# Patient Record
Sex: Female | Born: 1945 | Race: White | Hispanic: No | Marital: Married | Smoking: Never smoker
Health system: Southern US, Community
[De-identification: ages and names within clinical notes are randomized; demographics above are authoritative.]

## PROBLEM LIST (undated history)

## (undated) DIAGNOSIS — M8580 Other specified disorders of bone density and structure, unspecified site: Secondary | ICD-10-CM

## (undated) DIAGNOSIS — K922 Gastrointestinal hemorrhage, unspecified: Secondary | ICD-10-CM

## (undated) DIAGNOSIS — I429 Cardiomyopathy, unspecified: Secondary | ICD-10-CM

## (undated) DIAGNOSIS — R011 Cardiac murmur, unspecified: Secondary | ICD-10-CM

## (undated) DIAGNOSIS — Z8719 Personal history of other diseases of the digestive system: Secondary | ICD-10-CM

## (undated) DIAGNOSIS — R42 Dizziness and giddiness: Secondary | ICD-10-CM

## (undated) DIAGNOSIS — I509 Heart failure, unspecified: Secondary | ICD-10-CM

## (undated) DIAGNOSIS — M1990 Unspecified osteoarthritis, unspecified site: Secondary | ICD-10-CM

## (undated) DIAGNOSIS — C801 Malignant (primary) neoplasm, unspecified: Secondary | ICD-10-CM

## (undated) DIAGNOSIS — K529 Noninfective gastroenteritis and colitis, unspecified: Secondary | ICD-10-CM

## (undated) DIAGNOSIS — I499 Cardiac arrhythmia, unspecified: Secondary | ICD-10-CM

## (undated) DIAGNOSIS — G4700 Insomnia, unspecified: Secondary | ICD-10-CM

## (undated) DIAGNOSIS — E785 Hyperlipidemia, unspecified: Secondary | ICD-10-CM

## (undated) DIAGNOSIS — N393 Stress incontinence (female) (male): Secondary | ICD-10-CM

## (undated) DIAGNOSIS — D649 Anemia, unspecified: Secondary | ICD-10-CM

## (undated) DIAGNOSIS — K5790 Diverticulosis of intestine, part unspecified, without perforation or abscess without bleeding: Secondary | ICD-10-CM

## (undated) DIAGNOSIS — N189 Chronic kidney disease, unspecified: Secondary | ICD-10-CM

## (undated) DIAGNOSIS — I4891 Unspecified atrial fibrillation: Secondary | ICD-10-CM

## (undated) DIAGNOSIS — K219 Gastro-esophageal reflux disease without esophagitis: Secondary | ICD-10-CM

## (undated) DIAGNOSIS — E7800 Pure hypercholesterolemia, unspecified: Secondary | ICD-10-CM

## (undated) DIAGNOSIS — R0600 Dyspnea, unspecified: Secondary | ICD-10-CM

## (undated) DIAGNOSIS — I10 Essential (primary) hypertension: Secondary | ICD-10-CM

## (undated) DIAGNOSIS — R7303 Prediabetes: Secondary | ICD-10-CM

## (undated) DIAGNOSIS — I351 Nonrheumatic aortic (valve) insufficiency: Secondary | ICD-10-CM

## (undated) HISTORY — DX: Chronic kidney disease, unspecified: N18.9

## (undated) HISTORY — PX: TOTAL KNEE ARTHROPLASTY: SHX125

## (undated) HISTORY — DX: Gastrointestinal hemorrhage, unspecified: K92.2

## (undated) HISTORY — DX: Heart failure, unspecified: I50.9

## (undated) HISTORY — DX: Pure hypercholesterolemia, unspecified: E78.00

## (undated) HISTORY — DX: Stress incontinence (female) (male): N39.3

## (undated) HISTORY — PX: SACROCOCCYGEAL ULCER REMOVAL: SHX2369

## (undated) HISTORY — DX: Other specified disorders of bone density and structure, unspecified site: M85.80

## (undated) HISTORY — DX: Nonrheumatic aortic (valve) insufficiency: I35.1

## (undated) HISTORY — DX: Insomnia, unspecified: G47.00

## (undated) HISTORY — PX: COLONOSCOPY: SHX174

## (undated) HISTORY — PX: TONSILLECTOMY: SUR1361

---

## 1995-05-30 HISTORY — PX: ANKLE SURGERY: SHX546

## 2004-11-07 ENCOUNTER — Ambulatory Visit: Payer: Self-pay | Admitting: General Practice

## 2005-11-07 ENCOUNTER — Ambulatory Visit: Payer: Self-pay | Admitting: General Practice

## 2006-11-13 ENCOUNTER — Ambulatory Visit: Payer: Self-pay | Admitting: Endocrinology

## 2008-05-17 ENCOUNTER — Emergency Department: Payer: Self-pay | Admitting: Emergency Medicine

## 2010-08-04 ENCOUNTER — Ambulatory Visit: Payer: Self-pay | Admitting: Urology

## 2010-08-08 ENCOUNTER — Ambulatory Visit: Payer: Self-pay | Admitting: Urology

## 2011-05-25 ENCOUNTER — Inpatient Hospital Stay: Payer: Self-pay | Admitting: Internal Medicine

## 2011-05-27 LAB — HM COLONOSCOPY

## 2011-05-30 LAB — CA 125: CA 125: 11.9 U/mL (ref 0.0–34.0)

## 2011-05-30 LAB — CEA: CEA: 0.6 ng/mL (ref 0.0–4.7)

## 2011-07-11 ENCOUNTER — Ambulatory Visit: Payer: Self-pay | Admitting: Unknown Physician Specialty

## 2012-03-21 ENCOUNTER — Ambulatory Visit: Payer: Self-pay | Admitting: Family Medicine

## 2012-04-22 ENCOUNTER — Ambulatory Visit: Payer: Self-pay | Admitting: Oncology

## 2012-04-22 LAB — CBC CANCER CENTER
Basophil #: 0.1 x10 3/mm (ref 0.0–0.1)
Eosinophil %: 2.7 %
HCT: 26.7 % — ABNORMAL LOW (ref 35.0–47.0)
HGB: 7.8 g/dL — ABNORMAL LOW (ref 12.0–16.0)
MCH: 20.2 pg — ABNORMAL LOW (ref 26.0–34.0)
MCHC: 29.1 g/dL — ABNORMAL LOW (ref 32.0–36.0)
MCV: 69 fL — ABNORMAL LOW (ref 80–100)
Monocyte #: 0.3 x10 3/mm (ref 0.2–0.9)
Monocyte %: 5 %
Neutrophil %: 71.3 %
Platelet: 254 x10 3/mm (ref 150–440)
RBC: 3.85 10*6/uL (ref 3.80–5.20)
RDW: 17.4 % — ABNORMAL HIGH (ref 11.5–14.5)

## 2012-04-22 LAB — LACTATE DEHYDROGENASE: LDH: 158 U/L (ref 81–246)

## 2012-04-22 LAB — FOLATE: Folic Acid: 46 ng/mL (ref 3.1–100.0)

## 2012-04-22 LAB — RETICULOCYTES: Reticulocyte: 4.91 % — ABNORMAL HIGH (ref 0.5–2.2)

## 2012-04-28 ENCOUNTER — Ambulatory Visit: Payer: Self-pay | Admitting: Oncology

## 2012-05-28 ENCOUNTER — Ambulatory Visit: Payer: Self-pay | Admitting: Unknown Physician Specialty

## 2012-05-29 ENCOUNTER — Ambulatory Visit: Payer: Self-pay | Admitting: Oncology

## 2012-06-06 ENCOUNTER — Ambulatory Visit: Payer: Self-pay | Admitting: Unknown Physician Specialty

## 2012-06-07 LAB — PATHOLOGY REPORT

## 2012-06-20 ENCOUNTER — Ambulatory Visit: Payer: Self-pay | Admitting: Surgery

## 2012-06-24 ENCOUNTER — Ambulatory Visit: Payer: Self-pay | Admitting: Unknown Physician Specialty

## 2013-01-15 ENCOUNTER — Ambulatory Visit: Payer: Self-pay | Admitting: Unknown Physician Specialty

## 2014-06-25 DIAGNOSIS — I4892 Unspecified atrial flutter: Secondary | ICD-10-CM | POA: Diagnosis not present

## 2014-06-25 DIAGNOSIS — I4891 Unspecified atrial fibrillation: Secondary | ICD-10-CM | POA: Diagnosis not present

## 2014-07-21 DIAGNOSIS — I4891 Unspecified atrial fibrillation: Secondary | ICD-10-CM | POA: Diagnosis not present

## 2014-07-21 DIAGNOSIS — I4892 Unspecified atrial flutter: Secondary | ICD-10-CM | POA: Diagnosis not present

## 2014-08-06 DIAGNOSIS — R070 Pain in throat: Secondary | ICD-10-CM | POA: Diagnosis not present

## 2014-08-06 DIAGNOSIS — I4891 Unspecified atrial fibrillation: Secondary | ICD-10-CM | POA: Diagnosis not present

## 2014-08-06 DIAGNOSIS — J019 Acute sinusitis, unspecified: Secondary | ICD-10-CM | POA: Diagnosis not present

## 2014-08-25 DIAGNOSIS — I38 Endocarditis, valve unspecified: Secondary | ICD-10-CM | POA: Diagnosis not present

## 2014-08-25 DIAGNOSIS — I4892 Unspecified atrial flutter: Secondary | ICD-10-CM | POA: Diagnosis not present

## 2014-08-25 DIAGNOSIS — I4891 Unspecified atrial fibrillation: Secondary | ICD-10-CM | POA: Diagnosis not present

## 2014-08-25 DIAGNOSIS — I10 Essential (primary) hypertension: Secondary | ICD-10-CM | POA: Diagnosis not present

## 2014-08-25 DIAGNOSIS — E782 Mixed hyperlipidemia: Secondary | ICD-10-CM | POA: Diagnosis not present

## 2014-09-01 DIAGNOSIS — R600 Localized edema: Secondary | ICD-10-CM | POA: Diagnosis not present

## 2014-09-01 DIAGNOSIS — E782 Mixed hyperlipidemia: Secondary | ICD-10-CM | POA: Diagnosis not present

## 2014-09-01 DIAGNOSIS — I482 Chronic atrial fibrillation: Secondary | ICD-10-CM | POA: Diagnosis not present

## 2014-09-01 DIAGNOSIS — I10 Essential (primary) hypertension: Secondary | ICD-10-CM | POA: Diagnosis not present

## 2014-09-09 DIAGNOSIS — N183 Chronic kidney disease, stage 3 (moderate): Secondary | ICD-10-CM | POA: Diagnosis not present

## 2014-09-09 DIAGNOSIS — I10 Essential (primary) hypertension: Secondary | ICD-10-CM | POA: Diagnosis not present

## 2014-09-09 DIAGNOSIS — E119 Type 2 diabetes mellitus without complications: Secondary | ICD-10-CM | POA: Diagnosis not present

## 2014-09-09 DIAGNOSIS — E780 Pure hypercholesterolemia: Secondary | ICD-10-CM | POA: Diagnosis not present

## 2014-09-09 DIAGNOSIS — I509 Heart failure, unspecified: Secondary | ICD-10-CM | POA: Diagnosis not present

## 2014-09-09 DIAGNOSIS — Z0000 Encounter for general adult medical examination without abnormal findings: Secondary | ICD-10-CM | POA: Diagnosis not present

## 2014-09-20 NOTE — Discharge Summary (Signed)
PATIENT NAME:  Natalie Gardner, Natalie Gardner MR#:  [id number] DATE OF BIRTH:  1946/02/27  DATE OF ADMISSION:  05/25/2011 DATE OF DISCHARGE:  05/29/2011  PRIMARY CARE PHYSICIAN: [name]    REFERRING PHYSICIAN: Dr. Lovena Le   ADMITTING PHYSICIAN: Dr. Dustin Flock   DISCHARGING PHYSICIAN: Dr. Deanne Coffer   ADMITTING DIAGNOSES:  1. Weakness. 2. Anemia. 3. Nausea. 4. Vomiting. 5. Diarrhea.   DISCHARGE DIAGNOSES:  1. Iron deficiency anemia. 2. Microcytic anemia secondary to ulcer. 3. Duodenal stricture. 4. Gastritis. 5. Duodenitis. 6. Diarrhea.  7. Hypertension.  8. Hyperlipidemia.  9. Diabetes.   CONSULTANTS:  1. Dr. Loistine Simas  2. Dr. Leanora Cover  3. Dr. Vira Agar     TESTS DONE DURING THIS HOSPITALIZATION:  1. Chest x-ray, PA and lateral, 12/27, showed mildly increased interstitial markings in both lungs. This may reflect history of tobacco abuse. These findings seem to indicate chronic obstructive pulmonary disease. 2. CT scan of the abdomen and pelvis with contrast on 12/27 showed no CT evidence of obstructive or inflammatory abnormalities. Prominent mesenteric and retroperitoneal lymph nodes as well as prominent spleen.  3. CT of the chest with contrast 12/29 showed bilateral small pleural effusions. If there is clinical concern regarding extrinsic compression of the duodenum this would be better assessed on a CT of the abdomen which has already been performed on 05/25/2011. 4. Barium swallow upper GI series 12/31 showed a fixed narrowing of the second portion of the duodenum measuring less than 2 cm in length which persists throughout the examination most concerning for a mild stricture. Correlate with endoscopic evaluation which has already been performed.  5. Upper endoscopy done on 05/27/2011 by Dr. Gaylyn Cheers showed mild Schatzki's ring, gastric ulcer with clean base, duodenitis, duodenal stricture. 6. Colonoscopy 12/29 performed by Dr. Vira Agar which showed internal hemorrhoids  which were small. Biopsies were taken of the colon in ascending, transverse, and sigmoid colon. Impression internal hemorrhoids.   HOSPITAL COURSE: Initial history and physical were done by Dr. Dustin Flock. Please refer to his note dated 05/25/2011 for complete details. In brief, this is a 69 year old white female with past medical history of gastroesophageal reflux disease, hyperlipidemia, hypertension, history of heart murmur, and diet controlled diabetes who presented with weakness, anemia, nausea, vomiting, and diarrhea. Her hemoglobin was found to be 7.9. She was admitted to the hospitalist service.  1. For microcytic anemia secondary to iron deficiency anemia, the patient received blood transfusion 2 units packed red blood cells. Her hemoglobin improved appropriately. She had iron studies which showed microcytic anemia. Stool occult was negative. She had the above-noted upper endoscopy as well as colonoscopy. The patient had colonoscopy and had small ulcer and inflammation as well as duodenal stricture. She was put on PPI.  2. Diarrhea. Stool studies were negative. The patient was on Cipro. Diarrhea resolved.  3. Hypertension. This was borderline. We initially held her ACE inhibitor, hydrochlorothiazide, and Norvasc. This was resumed upon discharge. 4. Hyperlipidemia. The patient was continued on statin.  5. Diabetes. The patient was on sliding scale insulin.   6. Evidence of multiple retroperitoneal mesentery nodes with duodenal stricture. Oncology and Surgery were consulted, Dr. Leanora Cover and Dr. Loistine Simas. They reviewed the films as well as repeated CT of the chest and repeat CT showed no lymphadenopathy. Tumor markers with CEA, CA-125, 19-9 were sent and ordered by Dr. Loistine Simas. Hepatitis panels are still pending. CEA was within normal limits. CEA 125 within normal limits. CA-19-9 within normal limits. Angiotensin-converting enzyme still pending.  7. In  light of no lymphadenopathy, the patient  underwent upper GI series with barium swallow which was performed on 05/29/2011 which showed narrowing. The patient will be discharged on mechanical soft diet, follow-up with Dr. Vira Agar in 6 to 8 weeks' time. She should have repeat endoscopy to see if inflammation is improved and this was the cause of her strictures. There is no extrinsic cause, i.e., mass or lymphadenopathy causing this.  8. DVT prophylaxis. The patient was ambulatory.   The patient was discharged on 05/29/2011. Temperature 98.2, heart rate 82, respiratory rate 18, blood pressure 135/86, sating 94% on room air. Lungs clear to auscultation. Cardiovascular regular rate and rhythm. Abdomen benign.   DISCHARGE MEDICATIONS:  1. Hydrochlorothiazide 25 mg once a day. 2. Lisinopril 40 mg daily.  3. MVI 1 tablet daily.  4. Simvastatin 20 mg daily.  5. Zolpidem 5 mg at bedtime as needed for sleep.  6. Protonix 40 mg twice daily.  7. Ferrous sulfate 325 mg daily.  8. Colace 100 mg twice daily.   DO NOT TAKE: Hold aspirin and avoid nonsteroidals.   DIET: Low sodium, mechanical soft low residue, low fat diet.   ACTIVITY: As tolerated.   FOLLOW-UP:  1. She is to see [name] in 5 to 6 weeks. 2. Dr. Vira Agar in two weeks. 3. In 2 to 3 weeks by her primary care physician she should have checked a CBC and reticulocyte count.   CODE STATUS: FULL CODE.   TOTAL TIME SPENT ON DISCHARGE: 50 minutes.   Thank you for allowing me to participate in the care of this patient.    ____________________________ Judeth Horn. Royden Purl, MD aaf:drc D: 05/29/2011 14:53:40 ET T: 06/01/2011 10:49:16 ET JOB#: [id number]  cc: Mike Craze A. Royden Purl, MD, <Dictator> Consuela Mimes, MD Guadalupe Maple, MD Manya Silvas, MD Rolm Gala, MD Joaquin Bend MD ELECTRONICALLY SIGNED 06/01/2011 13:07

## 2014-09-20 NOTE — H&P (Signed)
PATIENT NAME:  Natalie Gardner, Natalie Gardner MR#:  [id number] DATE OF BIRTH:  1945/10/01  DATE OF ADMISSION:  05/25/2011  PRIMARY CARE PHYSICIAN: [name]  ED REFERRING PHYSICIAN: Dr. Lovena Le  CHIEF COMPLAINT: Weakness, anemia, nausea, vomiting, diarrhea.   HISTORY OF PRESENT ILLNESS: Patient is a 69 year old white female with history of gastroesophageal reflux disease, hyperlipidemia, hypertension, history of heart murmur, diet controlled diabetes who was in her usual state of health until about 2 to 3 weeks ago when she developed GI symptoms with diarrhea which was very liquidy, initially she was having 5 to 6 bowel movements associated with nausea and vomiting. She was seen by [name] last week. At that time she had a CBC done which showed elevated WBC therefore she was started on Cipro p.o. With the Cipro treatment her diarrhea frequency decreased. Recheck of her CBC showed WBC was now normal. However, she continues to have emesis and is also having diarrhea but the diarrhea frequency is now 2 to 3 times a day. She denies any blood in her stool. [name] checked hemoglobin last week which was 9.6. Recheck today showed a hemoglobin of 7.9. He had also done an iron panel which showed her ferritin to be 5, her iron level was 10 and iron saturation was 3%. Due to her hemoglobin dropping he referred her to ED for progressive weakness. Patient in the ED was seen by the ED physician and had rectal examination which was guaiac-negative. She underwent a CT scan of the abdomen which showed prominent mesenteric and retroperitoneal lymph node as well as prominent spleen. Patient otherwise denies any fevers or chills. She reports that the only antibiotic she has recently been on is the Cipro which was started after the diarrhea. The Cipro has only been treated for five days. She otherwise denies any fevers or chills. She does report some 10 pound weight loss over the past few weeks to months. She otherwise denies any  chest pain. No shortness of breath. No cough. No hemoptysis. No hematemesis. No blood in her stools that she has noticed. No hematuria. She denies any easy bruisability. Patient had a colonoscopy done 4 to 5 years ago which has been negative.   PAST MEDICAL HISTORY:  1. Gastroesophageal reflux disease.  2. Hyperlipidemia.  3. Hypertension.  4. History of heart murmur.  5. Diet controlled diabetes.  6. Status post tonsillectomy, adenoidectomy.  7. Status post right ankle fracture repair.   ALLERGIES: Sulfa, penicillin.   CURRENT MEDICATIONS:  1. Multivitamin daily.  2. Cipro 500, 1 tab p.o. q.12.  3. Aspirin 325, which has been on hold since last week. 4. Ambien 10 mg at bedtime.  5. Amlodipine 5 daily.  6. Lisinopril 40 daily.  7. Simvastatin 20 daily.  8. HCTZ 25 daily.   SOCIAL HISTORY: Nonsmoker. No alcohol or drug use.   FAMILY HISTORY: Positive for grandmother with gallbladder issues. Brother had colon cancer.   REVIEW OF SYSTEMS: CONSTITUTIONAL: No fevers. Complains of fatigue, weakness, mild abdominal pain. Complains of weight loss, 10 pounds. EYES: No blurred or double vision. No pain. No redness. No inflammation. No glaucoma. No cataracts. ENT: No tinnitus. No ear pain. No hearing loss. No seasonal allergies. No epistaxis. No nasal discharge. No snoring. No difficulty swallowing. RESPIRATORY: No cough. No wheezing. No hemoptysis. No dyspnea. No asthma. No painful respirations. No chronic obstructive pulmonary disease. No tuberculosis. CARDIOVASCULAR: No chest pain. No orthopnea. No edema. No arrhythmia. No dyspnea on exertion. GASTROINTESTINAL: Has nausea, vomiting, diarrhea,  vague epigastric discomfort. No hematemesis. No melena. No ulcers. Does have a history of gastroesophageal reflux disease. No irritable bowel syndrome. No jaundice. No rectal bleeding. No hemorrhoids. GENITOURINARY: Denies any dysuria, hematuria, renal calculus, frequency or incontinence. ENDOCRINE: Denies  any polyuria, nocturia, or thyroid problems. No increased sweating, heat or cold intolerance. HEME/LYMPH: Denies easy bruisability or bleeding. Recent diagnosis of anemia, none prior to that, but patient never had blood work that she recalls prior to that. SKIN: No acne. No rash. No changes in lesions. MUSCULOSKELETAL: Denies any pain in neck, back, or shoulder. NEUROLOGIC: No numbness. No weakness. No dysarthria. No cerebrovascular accident. No transient ischemic attack. PSYCHIATRIC: No anxiety. No insomnia. No ADD. No OCD.   PHYSICAL EXAMINATION:  VITAL SIGNS: Temperature 98.2, pulse 70, respiratory rate 20, blood pressure 117/62, O2 96% on room air.   GENERAL: Patient is a 69 year old white female well developed, well nourished female in no acute distress.   HEENT: Pupils equal, round, reactive to light, accommodation. Extraocular movements intact. There is no scleral icterus. Conjunctival pallor. Oropharynx without any lesions or ulcerations. No exudates. Nasal exam shows no drainage or ulcerations. Ear exam shows no erythema or drainage.   NECK: No thyromegaly. No carotid bruits.   CARDIOVASCULAR: Regular rate and rhythm. No murmurs, rubs, clicks, or gallops. PMI is not displaced.   LUNGS: Clear to auscultation bilaterally without any rales, rhonchi, wheezing.   ABDOMEN: Soft, nontender, nondistended. Positive bowel sounds x4. No hepatosplenomegaly per examination.   EXTREMITIES: No clubbing, cyanosis, or edema.   NEUROLOGIC: Awake, alert, oriented x3.   GENITOURINARY: Performed by the ED physician guaiac-negative.   SKIN: There is no rash.   LYMPHATICS: No lymph nodes palpable in the neck.   VASCULAR: Good DP, PT pulses.   NEUROLOGICAL: Awake, alert, oriented x3. No focal deficits.   PSYCHIATRIC: Not anxious or depressed.   LABORATORY, DIAGNOSTIC AND RADIOLOGICAL DATA: CT scan of the abdomen shows no evidence of obstructive or inflammatory abnormalities. Prominent mesenteric  and retroperitoneal lymph nodes as well as prominent spleen, enlarged lymph nodes are identified in the porta hepatitis. Urinalysis shows leukocytes 3+, nitrates negative, WBCs 26. CBC: WBC 7.7, hemoglobin 7.6, platelet count 296, MCV 71, lipase 237. BMP is normal with glucose 99, BUN 14, creatinine 0.64, sodium 142, potassium 3.7, chloride 107, CO2 27. LFTs showed a total protein decrease of 6.1, albumin of 2.9, otherwise LFTs are normal. INR 1.0.   ASSESSMENT AND PLAN: Patient is a 69 year old white female with history of hypertension, hyperlipidemia, diet controlled diabetes who is having diarrhea, nausea, vomiting for the past two weeks noted to be anemic last week with elevated WBC, hemoglobin 9.6, recheck today of hemoglobin 7.9, in the ED was 7.6. Blood work at primary MDs office shows iron level which is very low, ferritin is very low consistent with iron deficiency anemia.  1. Iron deficiency anemia with guaiac-negative stools. Unclear cause for the loss of blood. Patient does have splenomegaly. Possibly the anemia could be related to that. At this time, however, she will have to start off with a GI evaluation Will consult GI for EGD, colonoscopy. Will transfuse 2 units of packed RBCs. I will also check retic count, B12, folate. Her iron level has already been checked and ferritin has already been checked. Will check LDH and heptoglobin level. 2. Diarrhea, vomiting, unclear etiology. At this point will get stool studies for Clostridium difficile, get stool cultures. Will have GI evaluation. May need a colonoscopy with biopsy. Her diarrhea did  decrease with Cipro. Also she may have a urinary tract infection based on her urinalysis so I will continue the Cipro for time being.  3. Splenomegaly with lymph node enlargement, possible pathologic. Will have oncology come evaluate the patient for further recommendations.  4. Hypertension. Blood pressure currently normal. Will continue Norvasc. Hold HCTZ and  lisinopril in light of diarrhea.  5. Diabetes, diet controlled. Will do sliding scale insulin for time being.  6. CODE STATUS: FULL CODE.  7. Miscellaneous. We won't use any anticoagulation for deep vein thrombosis prophylaxis, patient is ambulatory.   TIME SPENT: 35 minutes spent. ____________________________ Lafonda Mosses. Posey Pronto, MD shp:cms D: 05/25/2011 21:49:53 ET T: 05/26/2011 06:55:22 ET  JOB#: [id number] cc: Mayvis Agudelo H. Posey Pronto, MD, <Dictator> Guadalupe Maple, MD Alric Seton MD ELECTRONICALLY SIGNED 06/03/2011 15:20

## 2014-09-20 NOTE — Consult Note (Signed)
PATIENT NAME:  Natalie Gardner, Natalie Gardner MR#:  [id number] DATE OF BIRTH:  03/22/1946  DATE OF CONSULTATION:  05/27/2011  CONSULTING PHYSICIAN:  Consuela Mimes, MD  REASON FOR CONSULTATION: Request laparoscopic mesenteric lymph node biopsy.   HISTORY OF PRESENT ILLNESS: Ms. Kehm is a 69 year old white female with a three-week history of nausea, vomiting and watery diarrhea. The diarrhea has improved significantly on ciprofloxacin, and she remains on p.o. Cipro as her only oral antibiotic. It is not completely resolved, however. She was admitted and found to have iron deficiency anemia with a hemoglobin of 7.9, but this has more recently come up to 9.6. She has no history of GI blood loss, but she does have 3.1% reticulocytes. She has had a 10-pound weight loss, and although she had an abnormal urinalysis on admission the urine culture is negative. She has had a colonoscopy which is normal and an EGD which shows a significant pyloric ulcer with a stricture between the first and second portions of the duodenum that was thought secondary to extrinsic compression. The reason for the consultation with me, specifically, is that she has a prominent spleen measuring 13.1 cm and prominent mesenteric and retroperitoneal lymphadenopathy as well as enlarged lymph nodes that were identified in the porta hepatis. This report was read on the date of the CT scan, which was 05/25/2011, by Dr. Burt Knack. She also underwent CT scan of the chest with contrast today, and that was essentially normal. PT/INR and platelet count are also normal.   PAST MEDICAL HISTORY:  1. Gastroesophageal reflux disease.  2. Dyslipidemia.  3. Type 2 diabetes mellitus (diet-controlled).  4. Hypertension.  5. Obesity.   CURRENT MEDICATIONS:  1. Multivitamin daily.  2. Cipro 500 mg every 12 hours.  3. Aspirin 325 mg daily (none for the last week). 4. Ambien 10 mg at bedtime.  5. Amlodipine 5 mg daily.  6. Lisinopril 40 mg daily.  7. Simvastatin  20 mg daily.  8. Hydrochlorothiazide 25 mg daily.  ALLERGIES: Sulfa and penicillin.   SOCIAL HISTORY: The patient is retired from working in a Estate manager/land agent. She is married and lives at home with her husband. She has never smoked cigarettes and never used alcohol.   FAMILY HISTORY: Noncontributory.   REVIEW OF SYSTEMS: Negative for 10 systems except the gastrointestinal system, as mentioned in the history of present illness, as well as a 10-pound weight loss. Specifically, the patient has never seen any GI blood loss.   PHYSICAL EXAMINATION:  GENERAL: A pleasant, middle-aged lady lying in the hospital bed with her husband in attendance. Height 5 feet 6 inches, weight 168 pounds, BMI 27.1.   VITAL SIGNS: Temperature 98.1, pulse 74, respirations 16, blood pressure 115/68, oxygen saturation 93% on room air at rest.   HEENT: Pupils are equal, round, and reactive to light. Extraocular movements are intact. Sclerae are anicteric. Oropharynx is clear. Mucous membranes moist.   NECK: Supple with no thyroid enlargement or cervical lymphadenopathy. The trachea is midline, and there is no jugular venous distention.   HEART: Regular rate and rhythm with no murmurs or rubs.   LUNGS: Clear to auscultation with normal respiratory effort bilaterally.   ABDOMEN: Soft, nontender, nondistended with no palpable masses or hepatosplenomegaly.   EXTREMITIES: No edema with normal capillary refill bilaterally.   NEUROLOGICAL: Cranial nerves II through XII, motor and sensation grossly intact.   PSYCHIATRIC: Alert and oriented x4. Appropriate affect.   DIAGNOSTIC AND RADIOLOGICAL DATA: I reviewed the CT scan of the abdomen  and pelvis myself, as well as reviewed it with the radiologist on call; and there is in fact no pathologic mesenteric or retroperitoneal adenopathy. There is one prominent lymph node that is stuck to the portal vein, but that is also not pathologically enlarged. Therefore, there is essentially  nothing that can be gained by a diagnostic laparoscopy. The radiologist on call will confer with Dr. Burt Knack, who will amend his report accordingly.  ____________________________ Consuela Mimes, MD wfm:cbb D: 05/27/2011 17:57:27 ET T: 05/28/2011 11:51:24 ET JOB#: [id number]  cc: Consuela Mimes, MD, <Dictator> Rolm Gala, MD Manya Silvas, MD Guadalupe Maple, MD Consuela Mimes MD ELECTRONICALLY SIGNED 05/31/2011 14:16

## 2014-09-20 NOTE — Consult Note (Signed)
PATIENT NAME:  Natalie Gardner, Natalie Gardner MR#:  [id number] DATE OF BIRTH:  06-05-1945  DATE OF CONSULTATION:  05/26/2011  CONSULTING PHYSICIAN:  Manya Silvas, MD  REASON FOR CONSULTATION:  The patient is a 69 year old white female who was found to have severe iron deficiency anemia. Hemoglobin was 7.9. A week or two ago it  was 9.6. Her ferritin was 5, her iron saturation was 3%. She came to the Emergency Department where she was found to be heme negative on rectal guaiac exam. She had a CT scan that showed prominent mesenteric and retroperitoneal lymph nodes and a prominent spleen. She was admitted to the hospital. I was asked to see her in consultation.   The patient has not been feeling well for about three weeks with no energy, and her color has looked bad, according to friends and relatives. She developed diarrhea, 5 or 6 loose stools a day, no nocturnal bowel movements. She saw [name] and was given Cipro, which decreased her frequency to two or three stools a day.   REVIEW OF SYSTEMS: She has had about a 10-pound weight loss with this illness. There were no night sweats. She denies any melena. She has had 2 or 3 weeks of diarrhea. She has had vomiting on two separate occasions, the last time was about a week ago. She is not running any fever. There is no hematemesis, melena, or hematochezia. Sometimes food will hang up for up to 30 minutes in her esophagus. The last spell was 4 or 5 months ago. She denies any foreign travel. Her only pet at home is a dog. She drinks well water. The well is filtered,  and her husband drinks the same water and has not been sick. She does have indigestion. She takes Prilosec for this helps, and it helps it greatly. She denies any rash. No joint inflammations. No mouth sores. She has had some abdominal pain under the left rib cage, and this started before Christmas.   PAST MEDICAL HISTORY:  1. Gastroesophageal reflux disease.   2. Hyperlipidemia. 3. Hypertension. 4. Known heart murmur. 5. Borderline diet-controlled diabetes.   PAST SURGICAL HISTORY: Tonsillectomy and adenoidectomy.   ALLERGIES: Sulfa and penicillin.   MEDICATIONS ON ADMISSION:  1. Cipro 500 mg every 12 hours.  2. Aspirin 325 mg, held for a week. 3. Ambien 10 mg at bedtime.  4. Amlodipine 5 mg a day.  5. Lisinopril 40 mg a day.  6. HCTZ 25 mg a day.  7. Simvastatin 20 mg a day.   HABITS: She does not smoke and does not drink.   FAMILY HISTORY:  Brother had a history of colon cancer.   PHYSICAL EXAMINATION:  GENERAL: White female in no acute distress.   VITAL SIGNS: Temperature 98, pulse 80, blood pressure 118/70.   HEENT: Sclerae are anicteric. Conjunctivae negative. Tongue slightly pale. The head is atraumatic.   NECK: Trachea is in the midline. There are no carotid bruits audible.   CHEST: Clear to auscultation.   HEART: A 3/6 systolic murmur that does not radiate to the carotids.   ABDOMEN: Abdomen shows a spleen felt about four fingerbreadths below the left costal margin. Abdomen is nontender.   EXTREMITIES: No edema.   SKIN: Warm and dry.   PSYCHIATRIC: Mood and affect are appropriate.      LABORATORY, DIAGNOSTIC AND RADIOLOGICAL DATA:  Glucose 99, BUN 14, creatinine 0.64, sodium 142, potassium 3.7, chloride 107, CO2 27, calcium 9.2, lipase 237, total protein 6.1, albumin 2.9,  total bilirubin 0.2, alkaline phosphatase 63, SGOT 20, SGPT 22.  White count 7.7, hemoglobin 7.6, MCV 71, MCH 21.7.  Reticulocyte count 3.1, absolute reticulocyte count 0.1.  A negative blood with a negative antibody screen.  Pro time 12.9.  Stool for white cells shows no white cells. Stool culture is negative. Stool for C. difficile is negative.  Urinalysis: 3+ leukocyte esterase, 26 white cells per high-power field.  CT scan shows enlarged mesenteric and retroperitoneal nodes and an enlarged spleen.   ASSESSMENT/PLAN: Iron deficiency  anemia with a recent fall in hemoglobin in a patient with a family history of colon cancer, who now also has swollen spleen and enlarged lymph nodes. I talked to the patient, and we are going to do a colonoscopy and an upper endoscopy tomorrow to rule in and rule out the GI tract as a source for any of these problems. It has been 4 or 5 years since she has had a colonoscopy.   She may need laparoscopic surgery with lymph node removal. She may need a bone marrow. The three most likely causes of this condition of enlarged lymph nodes and splenomegaly would be neoplasm-especially lymphoma, infections, granulomatous diseases. I agree with Hematology/Oncology consultation.   Plan for colonoscopy and upper endoscopy for tomorrow. The patient is to immediately go on a clear liquid diet and to drink GoLYTELY tonight.   ____________________________ Manya Silvas, MD rte:cbb D: 05/26/2011 17:26:05 ET T: 05/26/2011 18:17:41 ET JOB#: [id number]  cc: Manya Silvas, MD, <Dictator> Guadalupe Maple, MD Manya Silvas MD ELECTRONICALLY SIGNED 06/08/2011 12:09

## 2014-09-22 DIAGNOSIS — I4892 Unspecified atrial flutter: Secondary | ICD-10-CM | POA: Diagnosis not present

## 2014-09-22 DIAGNOSIS — I4891 Unspecified atrial fibrillation: Secondary | ICD-10-CM | POA: Diagnosis not present

## 2014-09-23 DIAGNOSIS — Z1231 Encounter for screening mammogram for malignant neoplasm of breast: Secondary | ICD-10-CM | POA: Diagnosis not present

## 2014-09-23 LAB — HM MAMMOGRAPHY

## 2014-10-20 DIAGNOSIS — I4891 Unspecified atrial fibrillation: Secondary | ICD-10-CM | POA: Diagnosis not present

## 2014-10-20 DIAGNOSIS — I4892 Unspecified atrial flutter: Secondary | ICD-10-CM | POA: Diagnosis not present

## 2014-11-23 ENCOUNTER — Other Ambulatory Visit: Payer: Self-pay | Admitting: Family Medicine

## 2014-12-08 DIAGNOSIS — I4892 Unspecified atrial flutter: Secondary | ICD-10-CM | POA: Diagnosis not present

## 2014-12-08 DIAGNOSIS — I4891 Unspecified atrial fibrillation: Secondary | ICD-10-CM | POA: Diagnosis not present

## 2014-12-25 DIAGNOSIS — K2970 Gastritis, unspecified, without bleeding: Secondary | ICD-10-CM | POA: Diagnosis not present

## 2014-12-29 ENCOUNTER — Emergency Department: Payer: Medicare Other

## 2014-12-29 ENCOUNTER — Encounter: Payer: Self-pay | Admitting: Emergency Medicine

## 2014-12-29 ENCOUNTER — Emergency Department
Admission: EM | Admit: 2014-12-29 | Discharge: 2014-12-29 | Disposition: A | Discharge status: Home / Self Care | Payer: Medicare Other | Attending: Emergency Medicine | Admitting: Emergency Medicine

## 2014-12-29 DIAGNOSIS — Z79899 Other long term (current) drug therapy: Secondary | ICD-10-CM | POA: Diagnosis not present

## 2014-12-29 DIAGNOSIS — R1084 Generalized abdominal pain: Secondary | ICD-10-CM

## 2014-12-29 DIAGNOSIS — Z7901 Long term (current) use of anticoagulants: Secondary | ICD-10-CM | POA: Insufficient documentation

## 2014-12-29 DIAGNOSIS — I10 Essential (primary) hypertension: Secondary | ICD-10-CM | POA: Diagnosis not present

## 2014-12-29 DIAGNOSIS — R531 Weakness: Secondary | ICD-10-CM | POA: Diagnosis not present

## 2014-12-29 DIAGNOSIS — K429 Umbilical hernia without obstruction or gangrene: Secondary | ICD-10-CM | POA: Diagnosis not present

## 2014-12-29 DIAGNOSIS — K529 Noninfective gastroenteritis and colitis, unspecified: Secondary | ICD-10-CM | POA: Diagnosis not present

## 2014-12-29 DIAGNOSIS — R112 Nausea with vomiting, unspecified: Secondary | ICD-10-CM | POA: Diagnosis not present

## 2014-12-29 DIAGNOSIS — K449 Diaphragmatic hernia without obstruction or gangrene: Secondary | ICD-10-CM | POA: Diagnosis not present

## 2014-12-29 DIAGNOSIS — I959 Hypotension, unspecified: Secondary | ICD-10-CM | POA: Diagnosis not present

## 2014-12-29 DIAGNOSIS — K6389 Other specified diseases of intestine: Secondary | ICD-10-CM | POA: Diagnosis not present

## 2014-12-29 DIAGNOSIS — R000 Tachycardia, unspecified: Secondary | ICD-10-CM | POA: Diagnosis not present

## 2014-12-29 HISTORY — DX: Essential (primary) hypertension: I10

## 2014-12-29 LAB — CBC
HEMATOCRIT: 44.6 % (ref 35.0–47.0)
Hemoglobin: 14.7 g/dL (ref 12.0–16.0)
MCH: 29.6 pg (ref 26.0–34.0)
MCHC: 33 g/dL (ref 32.0–36.0)
MCV: 89.6 fL (ref 80.0–100.0)
Platelets: 285 10*3/uL (ref 150–440)
RBC: 4.98 MIL/uL (ref 3.80–5.20)
RDW: 14.5 % (ref 11.5–14.5)
WBC: 9.8 10*3/uL (ref 3.6–11.0)

## 2014-12-29 LAB — COMPREHENSIVE METABOLIC PANEL
ALT: 22 U/L (ref 14–54)
AST: 28 U/L (ref 15–41)
Albumin: 4 g/dL (ref 3.5–5.0)
Alkaline Phosphatase: 69 U/L (ref 38–126)
Anion gap: 11 (ref 5–15)
BUN: 19 mg/dL (ref 6–20)
CALCIUM: 9.1 mg/dL (ref 8.9–10.3)
CO2: 23 mmol/L (ref 22–32)
Chloride: 97 mmol/L — ABNORMAL LOW (ref 101–111)
Creatinine, Ser: 1.13 mg/dL — ABNORMAL HIGH (ref 0.44–1.00)
GFR, EST AFRICAN AMERICAN: 56 mL/min — AB (ref >60)
GFR, EST NON AFRICAN AMERICAN: 48 mL/min — AB (ref >60)
GLUCOSE: 133 mg/dL — AB (ref 65–99)
Potassium: 4 mmol/L (ref 3.5–5.1)
Sodium: 131 mmol/L — ABNORMAL LOW (ref 135–145)
Total Bilirubin: 0.8 mg/dL (ref 0.3–1.2)
Total Protein: 7.4 g/dL (ref 6.5–8.1)

## 2014-12-29 LAB — LIPASE, BLOOD: LIPASE: 59 U/L — AB (ref 22–51)

## 2014-12-29 MED ORDER — ONDANSETRON 8 MG PO TBDP
8.0000 mg | ORAL_TABLET | Freq: Three times a day (TID) | ORAL | Status: DC | PRN
Start: 2014-12-29 — End: 2015-09-13

## 2014-12-29 MED ORDER — SODIUM CHLORIDE 0.9 % IV BOLUS (SEPSIS)
1000.0000 mL | Freq: Once | INTRAVENOUS | Status: AC
  Administered 2014-12-29: 1000 mL via INTRAVENOUS

## 2014-12-29 MED ORDER — METRONIDAZOLE 500 MG PO TABS: 500.0000 mg | ORAL_TABLET | Freq: Three times a day (TID) | ORAL | Status: DC

## 2014-12-29 MED ORDER — SULFAMETHOXAZOLE-TRIMETHOPRIM 800-160 MG PO TABS: 1.0000 | ORAL_TABLET | Freq: Two times a day (BID) | ORAL | Status: DC

## 2014-12-29 MED ORDER — IOHEXOL 300 MG/ML  SOLN
100.0000 mL | Freq: Once | INTRAMUSCULAR | Status: AC | PRN
  Administered 2014-12-29: 100 mL via INTRAVENOUS

## 2014-12-29 MED ORDER — SODIUM CHLORIDE 0.9 % IV BOLUS (SEPSIS): 1000.0000 mL | Freq: Once | INTRAVENOUS | Status: DC

## 2014-12-29 MED ORDER — IOHEXOL 240 MG/ML SOLN
25.0000 mL | Freq: Once | INTRAMUSCULAR | Status: AC | PRN
  Administered 2014-12-29: 25 mL via ORAL

## 2014-12-29 MED ORDER — CIPROFLOXACIN HCL 500 MG PO TABS: 500.0000 mg | ORAL_TABLET | Freq: Two times a day (BID) | ORAL | Status: DC

## 2014-12-29 MED ORDER — DOCUSATE SODIUM 100 MG PO CAPS: 200.0000 mg | ORAL_CAPSULE | Freq: Two times a day (BID) | ORAL | Status: DC

## 2014-12-29 NOTE — ED Notes (Signed)
Patient transported to CT 

## 2014-12-29 NOTE — ED Notes (Signed)
Pt discharged home after verbalizing understanding of discharge instructions; nad noted. 

## 2014-12-29 NOTE — Discharge Instructions (Signed)
Abdominal Pain  Many things can cause abdominal pain. Usually, abdominal pain is not caused by a disease and will improve without treatment. It can often be observed and treated at home. Your health care provider will do a physical exam and possibly order blood tests and X-rays to help determine the seriousness of your pain. However, in many cases, more time must pass before a clear cause of the pain can be found. Before that point, your health care provider may not know if you need more testing or further treatment.  HOME CARE INSTRUCTIONS   Monitor your abdominal pain for any changes. The following actions may help to alleviate any discomfort you are experiencing:   Only take over-the-counter or prescription medicines as directed by your health care provider.   Do not take laxatives unless directed to do so by your health care provider.   Try a clear liquid diet (broth, tea, or water) as directed by your health care provider. Slowly move to a bland diet as tolerated.  SEEK MEDICAL CARE IF:   You have unexplained abdominal pain.   You have abdominal pain associated with nausea or diarrhea.   You have pain when you urinate or have a bowel movement.   You experience abdominal pain that wakes you in the night.   You have abdominal pain that is worsened or improved by eating food.   You have abdominal pain that is worsened with eating fatty foods.   You have a fever.  SEEK IMMEDIATE MEDICAL CARE IF:    Your pain does not go away within 2 hours.   You keep throwing up (vomiting).   Your pain is felt only in portions of the abdomen, such as the right side or the left lower portion of the abdomen.   You pass bloody or black tarry stools.  MAKE SURE YOU:   Understand these instructions.    Will watch your condition.    Will get help right away if you are not doing well or get worse.   Document Released: 02/22/2005 Document Revised: 05/20/2013 Document Reviewed: 01/22/2013  ExitCare Patient Information  2015 [employer]. This information is not intended to replace advice given to you by your health care provider. Make sure you discuss any questions you have with your health care provider.  Colitis  Colitis is inflammation of the colon. Colitis can be a short-term or long-standing (chronic) illness. Crohn's disease and ulcerative colitis are 2 types of colitis which are chronic. They usually require lifelong treatment.  CAUSES   There are many different causes of colitis, including:   Viruses.   Germs (bacteria).   Medicine reactions.  SYMPTOMS    Diarrhea.   Intestinal bleeding.   Pain.   Fever.   Throwing up (vomiting).   Tiredness (fatigue).   Weight loss.   Bowel blockage.  DIAGNOSIS   The diagnosis of colitis is based on examination and stool or blood tests. X-rays, CT scan, and colonoscopy may also be needed.  TREATMENT   Treatment may include:   Fluids given through the vein (intravenously).   Bowel rest (nothing to eat or drink for a period of time).   Medicine for pain and diarrhea.   Medicines (antibiotics) that kill germs.   Cortisone medicines.   Surgery.  HOME CARE INSTRUCTIONS    Get plenty of rest.   Drink enough water and fluids to keep your urine clear or pale yellow.   Eat a well-balanced diet.   Call your caregiver   for follow-up as recommended.  SEEK IMMEDIATE MEDICAL CARE IF:    You develop chills.   You have an oral temperature above 102 F (38.9 C), not controlled by medicine.   You have extreme weakness, fainting, or dehydration.   You have repeated vomiting.   You develop severe belly (abdominal) pain or are passing bloody or tarry stools.  MAKE SURE YOU:    Understand these instructions.   Will watch your condition.   Will get help right away if you are not doing well or get worse.  Document Released: 06/22/2004 Document Revised: 08/07/2011 Document Reviewed: 09/17/2009  Exit[employer] Patient Information 2015 [employer]. This information is not intended to  replace advice given to you by your health care provider. Make sure you discuss any questions you have with your health care provider.

## 2014-12-29 NOTE — ED Notes (Signed)
Patient presents to the ED with vomiting since Friday.  Patient reports vomiting x 6 in the past 24 hours.  Patient reports one episode of diarrhea this am.  Patient was seen at an urgent care on Friday and was called and told she had an obstruction.  Patient states she hasn't had much abdominal pain and went to [facility] today for follow-up.  Patient was tachycardic and slightly hypotensive at [facility] and so they sent her to the ED.  Patient is in no obvious distress at this time.  Skin is warm and dry.  Respirations are even and nonlabored.

## 2014-12-29 NOTE — ED Provider Notes (Addendum)
[facility] Emergency Department Provider Note  ____________________________________________  Time seen: 10:00 AM  I have reviewed the triage vital signs and the nursing notes.   HISTORY  Chief Complaint Emesis    HPI Natalie Gardner is a 69 y.o. female who complains of generalized abdominal pain with distention and vomiting for the past 4 days. She was seen in an urgent care 4 days ago and told that she had a bowel obstruction and then discharged home. She continued to get worse over the weekend, and when she went to gastroenterology clinic this morning she was tachycardic and slightly hypotensive so she was sent to the ED. She complains of generalized abdominal pain but no chest pain or shortness of breath, no fevers or chills. She does note that her last 24 hours she's been able to tolerate a small amount of clear liquid diet. She had some watery bowel movement this morning, but otherwise no substantial bowel movements since the onset of her symptoms     Past Medical History  Diagnosis Date  . Hypertension     There are no active problems to display for this patient.   Past Surgical History  Procedure Laterality Date  . Sacrococcygeal ulcer removal      Current Outpatient Rx  Name  Route  Sig  Dispense  Refill  . atorvastatin (LIPITOR) 20 MG tablet   Oral   Take 1 tablet by mouth daily.         Marland Kitchen diltiazem (DILACOR XR) 180 MG 24 hr capsule   Oral   Take 1 capsule by mouth daily.         . furosemide (LASIX) 20 MG tablet   Oral   Take 1 tablet by mouth daily.         . hydrochlorothiazide (HYDRODIURIL) 25 MG tablet      TAKE 1 TABLET BY MOUTH EVERY DAY   90 tablet   1   . lisinopril (PRINIVIL,ZESTRIL) 40 MG tablet   Oral   Take 1 tablet by mouth daily.         . metoprolol succinate (TOPROL-XL) 50 MG 24 hr tablet   Oral   Take 1 tablet by mouth daily.         . Multiple Vitamin (MULTI-VITAMINS) TABS   Oral   Take 1  tablet by mouth daily.         . Omega-3 1000 MG CAPS   Oral   Take 1 capsule by mouth daily.         . pantoprazole (PROTONIX) 40 MG tablet   Oral   Take 1 tablet by mouth daily.         . promethazine (PHENERGAN) 12.5 MG suppository   Rectal   Place 1 suppository rectally as needed.         Marland Kitchen spironolactone (ALDACTONE) 25 MG tablet   Oral   Take 1 tablet by mouth daily.         . sucralfate (CARAFATE) 1 G tablet   Oral   Take 1 tablet by mouth 2 (two) times daily.         Marland Kitchen warfarin (COUMADIN) 2.5 MG tablet   Oral   Take 1 tablet by mouth every other day. Take 1 tablet every other day with 5mg          . warfarin (COUMADIN) 5 MG tablet   Oral   Take 5 mg by mouth daily.         Marland Kitchen  acetaminophen (TYLENOL) 325 MG tablet   Oral   Take 2 tablets by mouth as needed.         . docusate sodium (COLACE) 100 MG capsule   Oral   Take 2 capsules (200 mg total) by mouth 2 (two) times daily.   120 capsule   0   . metroNIDAZOLE (FLAGYL) 500 MG tablet   Oral   Take 1 tablet (500 mg total) by mouth 3 (three) times daily.   30 tablet   0   . ondansetron (ZOFRAN ODT) 8 MG disintegrating tablet   Oral   Take 1 tablet (8 mg total) by mouth every 8 (eight) hours as needed for nausea or vomiting.   20 tablet   0   . sulfamethoxazole-trimethoprim (BACTRIM DS) 800-160 MG per tablet   Oral   Take 1 tablet by mouth 2 (two) times daily.   14 tablet   0   . zolpidem (AMBIEN) 10 MG tablet   Oral   Take 1 tablet by mouth as needed.           Allergies Review of patient's allergies indicates no known allergies.  No family history on file.  Social History History  Substance Use Topics  . Smoking status: Not on file  . Smokeless tobacco: Not on file  . Alcohol Use: Not on file    Review of Systems  Constitutional: No fever or chills. No weight changes Eyes:No blurry vision or double vision.  ENT: No sore throat. Cardiovascular: No chest  pain. Respiratory: No dyspnea or cough. Gastrointestinal:generalized abdominal pain with vomiting as above.  No BRBPR or melena. Genitourinary: Negative for dysuria, urinary retention, bloody urine, or difficulty urinating. Musculoskeletal: Negative for back pain. No joint swelling or pain. Skin: Negative for rash. Neurological: Negative for headaches, focal weakness or numbness. Psychiatric:No anxiety or depression.   Endocrine:No hot/cold intolerance, changes in energy, or sleep difficulty.  10-point ROS otherwise negative.  ____________________________________________   PHYSICAL EXAM:  VITAL SIGNS: ED Triage Vitals  Enc Vitals Group     BP 12/29/14 0936 110/76 mmHg     Pulse Rate 12/29/14 0936 108     Resp 12/29/14 0936 16     Temp 12/29/14 0936 98.6 F (37 C)     Temp Source 12/29/14 0936 Oral     SpO2 12/29/14 0936 99 %     Weight 12/29/14 0936 177 lb (80.287 kg)     Height 12/29/14 0936 5\' 6"  (1.676 m)     Head Cir --      Peak Flow --      Pain Score 12/29/14 0936 5     Pain Loc --      Pain Edu? --      Excl. in [place]? --      Constitutional: Alert and oriented. Well appearing and in no distress. Eyes: No scleral icterus. No conjunctival pallor. PERRL. EOMI ENT   Head: Normocephalic and atraumatic.   Nose: No congestion/rhinnorhea. No septal hematoma   Mouth/Throat: MMM, no pharyngeal erythema. No peritonsillar mass. No uvula shift.   Neck: No stridor. No SubQ emphysema. No meningismus. Hematological/Lymphatic/Immunilogical: No cervical lymphadenopathy. Cardiovascular: RRR. Normal and symmetric distal pulses are present in all extremities. No murmurs, rubs, or gallops. Respiratory: Normal respiratory effort without tachypnea nor retractions. Breath sounds are clear and equal bilaterally. No wheezes/rales/rhonchi. Gastrointestinal:soft and nontender except for mild left lower quadrant tenderness. The abdomen is markedly distended and somewhat tense,  although percussion does not reveal  any temp knee. There is no fluid wave. There is no CVA tenderness.  No rebound, rigidity, or guarding. Genitourinary: deferred Musculoskeletal: Nontender with normal range of motion in all extremities. No joint effusions.  No lower extremity tenderness.  No edema. Neurologic:   Normal speech and language.  CN 2-10 normal. Motor grossly intact. No pronator drift.  Normal gait. No gross focal neurologic deficits are appreciated.  Skin:  Skin is warm, dry and intact. No rash noted.  No petechiae, purpura, or bullae. Psychiatric: Mood and affect are normal. Speech and behavior are normal. Patient exhibits appropriate insight and judgment.  ____________________________________________    LABS (pertinent positives/negatives) (all labs ordered are listed, but only abnormal results are displayed) Labs Reviewed  LIPASE, BLOOD - Abnormal; Notable for the following:    Lipase 59 (*)    All other components within normal limits  COMPREHENSIVE METABOLIC PANEL - Abnormal; Notable for the following:    Sodium 131 (*)    Chloride 97 (*)    Glucose, Bld 133 (*)    Creatinine, Ser 1.13 (*)    GFR calc non Af Amer 48 (*)    GFR calc Af Amer 56 (*)    All other components within normal limits  CBC  URINALYSIS COMPLETEWITH MICROSCOPIC ([facility] ONLY)  POC URINE PREG, ED   ____________________________________________   EKG    ____________________________________________    RADIOLOGY  CT report reviewed, images reviewed by me, discussed with radiologist. It is a small area of circumferential wall thickening in the descending colon consistent with colitis. No evidence of bowel obstruction or perforation.  ____________________________________________   PROCEDURES  ____________________________________________   INITIAL IMPRESSION / ASSESSMENT AND PLAN / ED COURSE  Pertinent labs & imaging results that were available during my care of the patient were  reviewed by me and considered in my medical decision making (see chart for details).  Exam concerning for worsening bowel obstruction. We'll get a CT while getting her IV fluids. Patient declines antiemetics or analgesics at this time.  ----------------------------------------- 1:42 PM on 12/29/2014 -----------------------------------------  Labs unremarkable.serial vital signs in the ED showed downtrending blood pressure with most recently 78/55. Heart rate remains normal at 84. Continue giving the patient IV fluids while awaiting CT results. ----------------------------------------- 3:29 PM on 12/29/2014 -----------------------------------------  Patient feeling well and asymptomatic tolerating oral intake. Blood pressure remains low per the reading, but the patient looks great and feels well. She is agreeable to be discharged home. All started her on oral antibiotics and have her follow-up with gastroenterology in one week. She will return to the ER if she has any change in symptoms or worsening in her condition. We will make sure that she is able to ambulate without symptoms prior to discharge. ____________________________________________   FINAL CLINICAL IMPRESSION(S) / ED DIAGNOSES  Final diagnoses:  Generalized abdominal pain  Colitis      Carrie Mew, MD 12/29/14 1530  Patient ambulatory without any symptoms whatsoever. We'll discharge home  Carrie Mew, MD 12/29/14 1531

## 2015-01-06 ENCOUNTER — Other Ambulatory Visit: Payer: Self-pay | Admitting: Unknown Physician Specialty

## 2015-01-06 DIAGNOSIS — K529 Noninfective gastroenteritis and colitis, unspecified: Secondary | ICD-10-CM | POA: Diagnosis not present

## 2015-01-06 DIAGNOSIS — R935 Abnormal findings on diagnostic imaging of other abdominal regions, including retroperitoneum: Secondary | ICD-10-CM

## 2015-01-12 ENCOUNTER — Ambulatory Visit
Admission: RE | Admit: 2015-01-12 | Discharge: 2015-01-12 | Disposition: A | Discharge status: Home / Self Care | Payer: Medicare Other | Source: Ambulatory Visit | Attending: Unknown Physician Specialty | Admitting: Unknown Physician Specialty

## 2015-01-12 DIAGNOSIS — Z0389 Encounter for observation for other suspected diseases and conditions ruled out: Secondary | ICD-10-CM | POA: Diagnosis not present

## 2015-01-12 DIAGNOSIS — R935 Abnormal findings on diagnostic imaging of other abdominal regions, including retroperitoneum: Secondary | ICD-10-CM

## 2015-02-05 ENCOUNTER — Encounter: Payer: Self-pay | Admitting: *Deleted

## 2015-02-08 ENCOUNTER — Ambulatory Visit: Payer: Medicare Other | Admitting: Anesthesiology

## 2015-02-08 ENCOUNTER — Ambulatory Visit
Admission: RE | Admit: 2015-02-08 | Discharge: 2015-02-08 | Disposition: A | Discharge status: Home / Self Care | Payer: Medicare Other | Source: Ambulatory Visit | Attending: Unknown Physician Specialty | Admitting: Unknown Physician Specialty

## 2015-02-08 ENCOUNTER — Encounter
Admission: RE | Disposition: A | Discharge status: Home / Self Care | Payer: Self-pay | Source: Ambulatory Visit | Attending: Unknown Physician Specialty

## 2015-02-08 ENCOUNTER — Encounter: Payer: Self-pay | Admitting: *Deleted

## 2015-02-08 DIAGNOSIS — I4891 Unspecified atrial fibrillation: Secondary | ICD-10-CM | POA: Diagnosis not present

## 2015-02-08 DIAGNOSIS — I10 Essential (primary) hypertension: Secondary | ICD-10-CM | POA: Diagnosis not present

## 2015-02-08 DIAGNOSIS — D122 Benign neoplasm of ascending colon: Secondary | ICD-10-CM | POA: Diagnosis not present

## 2015-02-08 DIAGNOSIS — K635 Polyp of colon: Secondary | ICD-10-CM | POA: Diagnosis not present

## 2015-02-08 DIAGNOSIS — I38 Endocarditis, valve unspecified: Secondary | ICD-10-CM | POA: Insufficient documentation

## 2015-02-08 DIAGNOSIS — I429 Cardiomyopathy, unspecified: Secondary | ICD-10-CM | POA: Diagnosis not present

## 2015-02-08 DIAGNOSIS — R011 Cardiac murmur, unspecified: Secondary | ICD-10-CM | POA: Insufficient documentation

## 2015-02-08 DIAGNOSIS — D125 Benign neoplasm of sigmoid colon: Secondary | ICD-10-CM | POA: Insufficient documentation

## 2015-02-08 DIAGNOSIS — R933 Abnormal findings on diagnostic imaging of other parts of digestive tract: Secondary | ICD-10-CM | POA: Diagnosis present

## 2015-02-08 DIAGNOSIS — K648 Other hemorrhoids: Secondary | ICD-10-CM | POA: Diagnosis not present

## 2015-02-08 DIAGNOSIS — K6289 Other specified diseases of anus and rectum: Secondary | ICD-10-CM | POA: Diagnosis not present

## 2015-02-08 DIAGNOSIS — K219 Gastro-esophageal reflux disease without esophagitis: Secondary | ICD-10-CM | POA: Diagnosis not present

## 2015-02-08 DIAGNOSIS — E119 Type 2 diabetes mellitus without complications: Secondary | ICD-10-CM | POA: Diagnosis not present

## 2015-02-08 DIAGNOSIS — K449 Diaphragmatic hernia without obstruction or gangrene: Secondary | ICD-10-CM | POA: Insufficient documentation

## 2015-02-08 DIAGNOSIS — K529 Noninfective gastroenteritis and colitis, unspecified: Secondary | ICD-10-CM | POA: Diagnosis not present

## 2015-02-08 DIAGNOSIS — E785 Hyperlipidemia, unspecified: Secondary | ICD-10-CM | POA: Insufficient documentation

## 2015-02-08 DIAGNOSIS — K640 First degree hemorrhoids: Secondary | ICD-10-CM | POA: Diagnosis not present

## 2015-02-08 DIAGNOSIS — D649 Anemia, unspecified: Secondary | ICD-10-CM | POA: Insufficient documentation

## 2015-02-08 DIAGNOSIS — Z79899 Other long term (current) drug therapy: Secondary | ICD-10-CM | POA: Insufficient documentation

## 2015-02-08 DIAGNOSIS — K5289 Other specified noninfective gastroenteritis and colitis: Secondary | ICD-10-CM | POA: Diagnosis not present

## 2015-02-08 DIAGNOSIS — Z7901 Long term (current) use of anticoagulants: Secondary | ICD-10-CM | POA: Insufficient documentation

## 2015-02-08 HISTORY — DX: Gastro-esophageal reflux disease without esophagitis: K21.9

## 2015-02-08 HISTORY — DX: Hyperlipidemia, unspecified: E78.5

## 2015-02-08 HISTORY — DX: Cardiomyopathy, unspecified: I42.9

## 2015-02-08 HISTORY — DX: Cardiac murmur, unspecified: R01.1

## 2015-02-08 HISTORY — DX: Anemia, unspecified: D64.9

## 2015-02-08 HISTORY — DX: Personal history of other diseases of the digestive system: Z87.19

## 2015-02-08 HISTORY — PX: COLONOSCOPY WITH PROPOFOL: SHX5780

## 2015-02-08 HISTORY — DX: Unspecified atrial fibrillation: I48.91

## 2015-02-08 LAB — GLUCOSE, CAPILLARY: Glucose-Capillary: 134 mg/dL — ABNORMAL HIGH (ref 65–99)

## 2015-02-08 SURGERY — COLONOSCOPY WITH PROPOFOL
Anesthesia: General

## 2015-02-08 MED ORDER — PROPOFOL INFUSION 10 MG/ML OPTIME
INTRAVENOUS | Status: DC | PRN
  Administered 2015-02-08: 100 ug/kg/min via INTRAVENOUS

## 2015-02-08 MED ORDER — SODIUM CHLORIDE 0.9 % IV SOLN: INTRAVENOUS | Status: DC

## 2015-02-08 MED ORDER — PROPOFOL 10 MG/ML IV BOLUS
INTRAVENOUS | Status: DC | PRN
  Administered 2015-02-08: 30 mg via INTRAVENOUS
  Administered 2015-02-08: 40 mg via INTRAVENOUS
  Administered 2015-02-08: 80 mg via INTRAVENOUS
  Administered 2015-02-08 (×2): 30 mg via INTRAVENOUS

## 2015-02-08 MED ORDER — SODIUM CHLORIDE 0.9 % IV SOLN
INTRAVENOUS | Status: DC
  Administered 2015-02-08: 1000 mL via INTRAVENOUS

## 2015-02-08 MED ORDER — LIDOCAINE HCL (CARDIAC) 20 MG/ML IV SOLN
INTRAVENOUS | Status: DC | PRN
  Administered 2015-02-08: 20 mg via INTRAVENOUS

## 2015-02-08 NOTE — H&P (Signed)
Primary Care Physician:  Golden Pop, MD Primary Gastroenterologist:  Dr. Vira Agar  Pre-Procedure History & Physical: HPI:  Natalie Gardner is a 69 y.o. female is here for an colonoscopy.   Past Medical History  Diagnosis Date  . Hypertension   . Hyperlipidemia   . Atrial fibrillation   . VHD (valvular heart disease)   . Diabetes mellitus without complication   . Cardiomyopathy   . GERD (gastroesophageal reflux disease)   . History of hiatal hernia   . Heart murmur   . Anemia     Past Surgical History  Procedure Laterality Date  . Sacrococcygeal ulcer removal    . Tonsillectomy    . Colonoscopy      Prior to Admission medications   Medication Sig Start Date End Date Taking? Authorizing Provider  atorvastatin (LIPITOR) 20 MG tablet Take 1 tablet by mouth daily. 02/18/14 02/18/15 Yes Historical Provider, MD  diltiazem (DILACOR XR) 180 MG 24 hr capsule Take 1 capsule by mouth daily. 06/02/14 06/02/15 Yes Historical Provider, MD  docusate sodium (COLACE) 100 MG capsule Take 2 capsules (200 mg total) by mouth 2 (two) times daily. 12/29/14  Yes Carrie Mew, MD  furosemide (LASIX) 20 MG tablet Take 1 tablet by mouth daily. 09/21/14  Yes Historical Provider, MD  hydrochlorothiazide (HYDRODIURIL) 25 MG tablet TAKE 1 TABLET BY MOUTH EVERY DAY 11/23/14  Yes Guadalupe Maple, MD  lisinopril (PRINIVIL,ZESTRIL) 40 MG tablet Take 1 tablet by mouth daily.   Yes Historical Provider, MD  metoprolol succinate (TOPROL-XL) 50 MG 24 hr tablet Take 1 tablet by mouth daily. 06/02/14 06/02/15 Yes Historical Provider, MD  Multiple Vitamin (MULTI-VITAMINS) TABS Take 1 tablet by mouth daily.   Yes Historical Provider, MD  Omega-3 1000 MG CAPS Take 1 capsule by mouth daily.   Yes Historical Provider, MD  ondansetron (ZOFRAN ODT) 8 MG disintegrating tablet Take 1 tablet (8 mg total) by mouth every 8 (eight) hours as needed for nausea or vomiting. 12/29/14  Yes Carrie Mew, MD  pantoprazole (PROTONIX) 40 MG  tablet Take 1 tablet by mouth daily.   Yes Historical Provider, MD  promethazine (PHENERGAN) 12.5 MG suppository Place 1 suppository rectally as needed. 12/25/14  Yes Historical Provider, MD  spironolactone (ALDACTONE) 25 MG tablet Take 1 tablet by mouth daily. 06/02/14 06/02/15 Yes Historical Provider, MD  sucralfate (CARAFATE) 1 G tablet Take 1 tablet by mouth 2 (two) times daily. 02/18/14  Yes Historical Provider, MD  zolpidem (AMBIEN) 10 MG tablet Take 1 tablet by mouth as needed.   Yes Historical Provider, MD  acetaminophen (TYLENOL) 325 MG tablet Take 2 tablets by mouth as needed.    Historical Provider, MD  metroNIDAZOLE (FLAGYL) 500 MG tablet Take 1 tablet (500 mg total) by mouth 3 (three) times daily. Patient not taking: Reported on 02/08/2015 12/29/14   Carrie Mew, MD  sulfamethoxazole-trimethoprim (BACTRIM DS) 800-160 MG per tablet Take 1 tablet by mouth 2 (two) times daily. Patient not taking: Reported on 02/08/2015 12/29/14   Carrie Mew, MD  warfarin (COUMADIN) 2.5 MG tablet Take 1 tablet by mouth every other day. Take 1 tablet every other day with 5mg  06/26/14 06/26/15  Historical Provider, MD  warfarin (COUMADIN) 5 MG tablet Take 5 mg by mouth daily.    Historical Provider, MD    Allergies as of 01/13/2015  . (No Known Allergies)    History reviewed. No pertinent family history.  Social History   Social History  . Marital Status: Married  Spouse Name: N/A  . Number of Children: N/A  . Years of Education: N/A   Occupational History  . Not on file.   Social History Main Topics  . Smoking status: Never Smoker   . Smokeless tobacco: Never Used  . Alcohol Use: No  . Drug Use: No  . Sexual Activity: Not on file   Other Topics Concern  . Not on file   Social History Narrative    Review of Systems: See HPI, otherwise negative ROS  Physical Exam: BP 122/59 mmHg  Pulse 105  Temp(Src) 96.4 F (35.8 C) (Tympanic)  Resp 14  Ht 5\' 6"  (1.676 m)  Wt 79.833 kg (176  lb)  BMI 28.42 kg/m2  SpO2 100% General:   Alert,  pleasant and cooperative in NAD Head:  Normocephalic and atraumatic. Neck:  Supple; no masses or thyromegaly. Lungs:  Clear throughout to auscultation.    Heart:  Some irregular beats, no murmur Abdomen:  Soft, nontender and nondistended. Normal bowel sounds, without guarding, and without rebound.   Neurologic:  Alert and  oriented x4;  grossly normal neurologically.  Impression/Plan: Natalie Gardner is here for an colonoscopy to be performed for abnormal CT of descending colon.  Risks, benefits, limitations, and alternatives regarding  colonoscopy have been reviewed with the patient.  Questions have been answered.  All parties agreeable.   Gaylyn Cheers, MD  02/08/2015, 8:49 AM

## 2015-02-08 NOTE — Anesthesia Preprocedure Evaluation (Addendum)
Anesthesia Evaluation  Patient identified by MRN, date of birth, ID band Patient awake    Reviewed: Allergy & Precautions, H&P , NPO status , Patient's Chart, lab work & pertinent test results, reviewed documented beta blocker date and time   History of Anesthesia Complications Negative for: history of anesthetic complications  Airway Mallampati: I  TM Distance: >3 FB Neck ROM: full    Dental no notable dental hx. (+) Teeth Intact   Pulmonary neg pulmonary ROS,    Pulmonary exam normal breath sounds clear to auscultation       Cardiovascular Exercise Tolerance: Good hypertension, On Medications and On Home Beta Blockers (-) angina(-) CAD, (-) Past MI, (-) Cardiac Stents and (-) CABG + dysrhythmias Atrial Fibrillation + Valvular Problems/Murmurs  Rhythm:irregular Rate:Abnormal + Systolic murmurs    Neuro/Psych negative neurological ROS  negative psych ROS   GI/Hepatic Neg liver ROS, hiatal hernia, GERD  Medicated and Controlled,  Endo/Other  diabetes (pre-diabetic)  Renal/GU negative Renal ROS  negative genitourinary   Musculoskeletal   Abdominal   Peds  Hematology  (+) Blood dyscrasia (resolved), anemia ,   Anesthesia Other Findings Past Medical History:   Hypertension                                                 Hyperlipidemia                                               Atrial fibrillation                                          VHD (valvular heart disease)                                 Diabetes mellitus without complication                       Cardiomyopathy                                               GERD (gastroesophageal reflux disease)                       History of hiatal hernia                                     Heart murmur                                                 Anemia  Reproductive/Obstetrics negative OB ROS                             Anesthesia Physical Anesthesia Plan  ASA: III  Anesthesia Plan: General   Post-op Pain Management:    Induction:   Airway Management Planned:   Additional Equipment:   Intra-op Plan:   Post-operative Plan:   Informed Consent: I have reviewed the patients History and Physical, chart, labs and discussed the procedure including the risks, benefits and alternatives for the proposed anesthesia with the patient or authorized representative who has indicated his/her understanding and acceptance.   Dental Advisory Given  Plan Discussed with: Anesthesiologist, CRNA and Surgeon  Anesthesia Plan Comments:         Anesthesia Quick Evaluation

## 2015-02-08 NOTE — Op Note (Signed)
[facility] Patient Name: Natalie Gardner Procedure Date: 02/08/2015 8:50 AM MRN: [id number] Account #: [id number] Date of Birth: [birth year] Admit Type: Outpatient Age: 69 Room: [facility] ENDO ROOM 1 Gender: Female Note Status: Finalized Procedure:         Colonoscopy Indications:       Abnormal CT of the GI tract Providers:         Manya Silvas, MD Referring MD:      Guadalupe Maple, MD (Referring MD) Medicines:         Propofol per Anesthesia Complications:     No immediate complications. Procedure:         Pre-Anesthesia Assessment:                    - After reviewing the risks and benefits, the patient was                     deemed in satisfactory condition to undergo the procedure.                    After obtaining informed consent, the colonoscope was                     passed under direct vision. Throughout the procedure, the                     patient's blood pressure, pulse, and oxygen saturations                     were monitored continuously. The Colonoscope was                     introduced through the anus and advanced to the the cecum,                     identified by appendiceal orifice and ileocecal valve. The                     colonoscopy was performed without difficulty. The patient                     tolerated the procedure well. The quality of the bowel                     preparation was good. Findings:      A diminutive polyp was found in the sigmoid colon. The polyp was       sessile. The polyp was removed with a hot snare. Resection and retrieval       were complete.      A diminutive polyp was found in the ascending colon. The polyp was       sessile. The polyp was removed with a jumbo cold forceps. Resection and       retrieval were complete.      Internal hemorrhoids were found during endoscopy. The hemorrhoids were       small and Grade I (internal hemorrhoids that do not prolapse).      A diffuse and  patchy area of mildly erythematous mucosa was found in the       rectum. Biopsies were taken with a cold forceps for histology.      The exam was otherwise without abnormality. Impression:        - One diminutive polyp in the sigmoid colon. Resected  and                     retrieved.                    - One diminutive polyp in the ascending colon. Resected                     and retrieved.                    - Internal hemorrhoids.                    - Erythematous mucosa in the rectum. Biopsied.                    - The examination was otherwise normal. Recommendation:    - Await pathology results. Manya Silvas, MD 02/08/2015 9:27:31 AM This report has been signed electronically. Number of Addenda: 0 Note Initiated On: 02/08/2015 8:50 AM Scope Withdrawal Time: 0 hours 13 minutes 42 seconds  Total Procedure Duration: 0 hours 26 minutes 33 seconds       [facility]

## 2015-02-08 NOTE — Transfer of Care (Signed)
Immediate Anesthesia Transfer of Care Note  Patient: Natalie Gardner  Procedure(s) Performed: Procedure(s): COLONOSCOPY WITH PROPOFOL (N/A)  Patient Location: Endoscopy Unit  Anesthesia Type:General  Level of Consciousness: sedated  Airway & Oxygen Therapy: Patient Spontanous Breathing and Patient connected to nasal cannula oxygen  Post-op Assessment: Report given to RN and Post -op Vital signs reviewed and stable  Post vital signs: Reviewed and stable  Last Vitals:  Filed Vitals:   02/08/15 0926  BP: 95/74  Pulse: 98  Temp: 36.1 C  Resp: 16    Complications: No apparent anesthesia complications

## 2015-02-08 NOTE — Anesthesia Procedure Notes (Signed)
Date/Time: 02/08/2015 8:50 AM Performed by: Martha Clan Pre-anesthesia Checklist: Patient identified, Emergency Drugs available, Suction available, Patient being monitored and Timeout performed Patient Re-evaluated:Patient Re-evaluated prior to inductionOxygen Delivery Method: Nasal cannula Preoxygenation: Pre-oxygenation with 100% oxygen Intubation Type: IV induction

## 2015-02-09 ENCOUNTER — Encounter: Payer: Self-pay | Admitting: Unknown Physician Specialty

## 2015-02-09 LAB — SURGICAL PATHOLOGY

## 2015-02-09 NOTE — Anesthesia Postprocedure Evaluation (Signed)
  Anesthesia Post-op Note  Patient: Natalie Gardner  Procedure(s) Performed: Procedure(s): COLONOSCOPY WITH PROPOFOL (N/A)  Anesthesia type:General  Patient location: PACU  Post pain: Pain level controlled  Post assessment: Post-op Vital signs reviewed, Patient's Cardiovascular Status Stable, Respiratory Function Stable, Patent Airway and No signs of Nausea or vomiting  Post vital signs: Reviewed and stable  Last Vitals:  Filed Vitals:   02/08/15 1000  BP: 109/83  Pulse: 77  Temp:   Resp: 17    Level of consciousness: awake, alert  and patient cooperative  Complications: No apparent anesthesia complications

## 2015-02-19 DIAGNOSIS — S8391XA Sprain of unspecified site of right knee, initial encounter: Secondary | ICD-10-CM | POA: Diagnosis not present

## 2015-03-03 DIAGNOSIS — I482 Chronic atrial fibrillation: Secondary | ICD-10-CM | POA: Diagnosis not present

## 2015-03-03 DIAGNOSIS — E782 Mixed hyperlipidemia: Secondary | ICD-10-CM | POA: Diagnosis not present

## 2015-03-03 DIAGNOSIS — I340 Nonrheumatic mitral (valve) insufficiency: Secondary | ICD-10-CM | POA: Diagnosis not present

## 2015-03-09 DIAGNOSIS — Z86010 Personal history of colonic polyps: Secondary | ICD-10-CM | POA: Diagnosis not present

## 2015-03-09 DIAGNOSIS — K529 Noninfective gastroenteritis and colitis, unspecified: Secondary | ICD-10-CM | POA: Diagnosis not present

## 2015-03-13 DIAGNOSIS — M25561 Pain in right knee: Secondary | ICD-10-CM | POA: Diagnosis not present

## 2015-03-13 DIAGNOSIS — S83411A Sprain of medial collateral ligament of right knee, initial encounter: Secondary | ICD-10-CM | POA: Diagnosis not present

## 2015-03-13 DIAGNOSIS — M179 Osteoarthritis of knee, unspecified: Secondary | ICD-10-CM | POA: Diagnosis not present

## 2015-03-13 DIAGNOSIS — M7051 Other bursitis of knee, right knee: Secondary | ICD-10-CM | POA: Diagnosis not present

## 2015-03-16 ENCOUNTER — Ambulatory Visit (INDEPENDENT_AMBULATORY_CARE_PROVIDER_SITE_OTHER): Payer: Medicare Other | Admitting: Family Medicine

## 2015-03-16 ENCOUNTER — Encounter: Payer: Self-pay | Admitting: Family Medicine

## 2015-03-16 VITALS — BP 102/74 | HR 81 | Temp 98.8°F | Ht 62.0 in | Wt 174.0 lb

## 2015-03-16 DIAGNOSIS — Z23 Encounter for immunization: Secondary | ICD-10-CM

## 2015-03-16 DIAGNOSIS — S86911A Strain of unspecified muscle(s) and tendon(s) at lower leg level, right leg, initial encounter: Secondary | ICD-10-CM | POA: Diagnosis not present

## 2015-03-16 NOTE — Assessment & Plan Note (Addendum)
Discussed care and treatment use a neoprene sleeve  We'll not use nonsteroidal anti-inflammatory agents as patient on warfarin Still has some tramadol will use that sparingly along with Tylenol. If not getting better refer to orthopedics Discuss ice rest

## 2015-03-16 NOTE — Progress Notes (Signed)
BP 102/74 mmHg  Pulse 81  Temp(Src) 98.8 F (37.1 C)  Ht 5\' 2"  (1.575 m)  Wt 174 lb (78.926 kg)  BMI 31.82 kg/m2  SpO2 99%   Subjective:    Patient ID: Natalie Gardner, female    DOB: Mar 15, 1946, 69 y.o.   MRN: [id number]  HPI: Natalie Gardner is a 69 y.o. female  Chief Complaint  Patient presents with  . Knee Pain    right x 3weeks   presents for with knee pain involving the right knee. Onset was 3 weeks ago, after stepping in a hole and twisting her knee. Current symptoms include: crepitus sensation, pain located medially and stiffness. Pain is aggravated by any weight bearing. The knee has given out or felt unstable. The patient can bend and straighten the knee fully. Patient has had no prior knee problems. Still ongoing after visit to [facility]  Relevant past medical, surgical, family and social history reviewed and updated as indicated. Interim medical history since our last visit reviewed. Allergies and medications reviewed and updated.  Review of Systems  Constitutional: Negative.   Respiratory: Negative.   Cardiovascular: Negative.     Per HPI unless specifically indicated above     Objective:    BP 102/74 mmHg  Pulse 81  Temp(Src) 98.8 F (37.1 C)  Ht 5\' 2"  (1.575 m)  Wt 174 lb (78.926 kg)  BMI 31.82 kg/m2  SpO2 99%  Wt Readings from Last 3 Encounters:  03/16/15 174 lb (78.926 kg)  03/16/15 177 lb (80.287 kg)  02/08/15 176 lb (79.833 kg)    Physical Exam  Constitutional: She is oriented to person, place, and time. She appears well-developed and well-nourished. No distress.  HENT:  Head: Normocephalic and atraumatic.  Right Ear: Hearing normal.  Left Ear: Hearing normal.  Nose: Nose normal.  Eyes: Conjunctivae and lids are normal. Right eye exhibits no discharge. Left eye exhibits no discharge. No scleral icterus.  Pulmonary/Chest: Effort normal. No respiratory distress.  Musculoskeletal: Normal range of motion.  Right knee obviously swollen tender  warm to the touch no obvious joint laxity with marked effusion. Area was prepped with Betadine and alcohol and infiltrated with lidocaine and supra patellar attempt to aspirate bursa were unsuccessful Another attempt was performed with same procedure intra-articularly with again dry tap. Knee was injected with Kenalog and Marcaine  Neurological: She is alert and oriented to person, place, and time.  Skin: Skin is intact. No rash noted.  Psychiatric: She has a normal mood and affect. Her speech is normal and behavior is normal. Judgment and thought content normal. Cognition and memory are normal.    Results for orders placed or performed in visit on 03/16/15  HM MAMMOGRAPHY  Result Value Ref Range   HM Mammogram [facility]--PP   HM COLONOSCOPY  Result Value Ref Range   HM Colonoscopy NEXT DUE 2017   HM COLONOSCOPY  Result Value Ref Range   HM Colonoscopy [facility]--NEXT DUE 2017--PP       Assessment & Plan:   Problem List Items Addressed This Visit      Other   Strain of right knee    Discussed care and treatment use a neoprene sleeve  We'll not use nonsteroidal anti-inflammatory agents as patient on warfarin Still has some tramadol will use that sparingly along with Tylenol. If not getting better refer to orthopedics Discuss ice rest       Other Visit Diagnoses    Immunization due    -  Primary    Relevant Orders    Flu Vaccine QUAD 36+ mos PF IM (Fluarix & Fluzone Quad PF) (Completed)        Follow up plan: Return in about 6 months (around 09/14/2015) for Physical Exam.

## 2015-05-21 ENCOUNTER — Other Ambulatory Visit: Payer: Self-pay | Admitting: Family Medicine

## 2015-05-29 ENCOUNTER — Other Ambulatory Visit: Payer: Self-pay | Admitting: Family Medicine

## 2015-06-02 ENCOUNTER — Other Ambulatory Visit: Payer: Self-pay | Admitting: Family Medicine

## 2015-07-01 DIAGNOSIS — Z5181 Encounter for therapeutic drug level monitoring: Secondary | ICD-10-CM | POA: Diagnosis not present

## 2015-07-01 DIAGNOSIS — Z7901 Long term (current) use of anticoagulants: Secondary | ICD-10-CM | POA: Diagnosis not present

## 2015-07-23 ENCOUNTER — Other Ambulatory Visit: Payer: Self-pay | Admitting: Family Medicine

## 2015-07-28 DIAGNOSIS — Z7901 Long term (current) use of anticoagulants: Secondary | ICD-10-CM | POA: Diagnosis not present

## 2015-07-28 DIAGNOSIS — Z5181 Encounter for therapeutic drug level monitoring: Secondary | ICD-10-CM | POA: Diagnosis not present

## 2015-08-25 DIAGNOSIS — Z5181 Encounter for therapeutic drug level monitoring: Secondary | ICD-10-CM | POA: Diagnosis not present

## 2015-08-25 DIAGNOSIS — Z7901 Long term (current) use of anticoagulants: Secondary | ICD-10-CM | POA: Diagnosis not present

## 2015-09-06 DIAGNOSIS — R0602 Shortness of breath: Secondary | ICD-10-CM | POA: Diagnosis not present

## 2015-09-06 DIAGNOSIS — E782 Mixed hyperlipidemia: Secondary | ICD-10-CM | POA: Diagnosis not present

## 2015-09-06 DIAGNOSIS — I482 Chronic atrial fibrillation: Secondary | ICD-10-CM | POA: Diagnosis not present

## 2015-09-06 DIAGNOSIS — I10 Essential (primary) hypertension: Secondary | ICD-10-CM | POA: Diagnosis not present

## 2015-09-13 ENCOUNTER — Ambulatory Visit (INDEPENDENT_AMBULATORY_CARE_PROVIDER_SITE_OTHER): Payer: Medicare Other | Admitting: Family Medicine

## 2015-09-13 ENCOUNTER — Encounter: Payer: Self-pay | Admitting: Family Medicine

## 2015-09-13 VITALS — BP 94/64 | HR 70 | Temp 98.8°F | Ht 61.3 in | Wt 178.0 lb

## 2015-09-13 DIAGNOSIS — R300 Dysuria: Secondary | ICD-10-CM

## 2015-09-13 DIAGNOSIS — I4891 Unspecified atrial fibrillation: Secondary | ICD-10-CM | POA: Insufficient documentation

## 2015-09-13 DIAGNOSIS — I509 Heart failure, unspecified: Secondary | ICD-10-CM | POA: Insufficient documentation

## 2015-09-13 DIAGNOSIS — E1159 Type 2 diabetes mellitus with other circulatory complications: Secondary | ICD-10-CM

## 2015-09-13 DIAGNOSIS — Z0000 Encounter for general adult medical examination without abnormal findings: Secondary | ICD-10-CM | POA: Diagnosis not present

## 2015-09-13 DIAGNOSIS — I482 Chronic atrial fibrillation, unspecified: Secondary | ICD-10-CM

## 2015-09-13 DIAGNOSIS — E785 Hyperlipidemia, unspecified: Secondary | ICD-10-CM

## 2015-09-13 DIAGNOSIS — I10 Essential (primary) hypertension: Secondary | ICD-10-CM | POA: Insufficient documentation

## 2015-09-13 DIAGNOSIS — Z1231 Encounter for screening mammogram for malignant neoplasm of breast: Secondary | ICD-10-CM

## 2015-09-13 DIAGNOSIS — E119 Type 2 diabetes mellitus without complications: Secondary | ICD-10-CM | POA: Insufficient documentation

## 2015-09-13 DIAGNOSIS — E669 Obesity, unspecified: Secondary | ICD-10-CM | POA: Insufficient documentation

## 2015-09-13 DIAGNOSIS — Z23 Encounter for immunization: Secondary | ICD-10-CM

## 2015-09-13 DIAGNOSIS — I152 Hypertension secondary to endocrine disorders: Secondary | ICD-10-CM | POA: Insufficient documentation

## 2015-09-13 DIAGNOSIS — L989 Disorder of the skin and subcutaneous tissue, unspecified: Secondary | ICD-10-CM

## 2015-09-13 DIAGNOSIS — E1169 Type 2 diabetes mellitus with other specified complication: Secondary | ICD-10-CM

## 2015-09-13 LAB — URINALYSIS, ROUTINE W REFLEX MICROSCOPIC
Bilirubin, UA: NEGATIVE
Glucose, UA: NEGATIVE
KETONES UA: NEGATIVE
Nitrite, UA: NEGATIVE
PH UA: 5.5 (ref 5.0–7.5)
Protein, UA: NEGATIVE
SPEC GRAV UA: 1.015 (ref 1.005–1.030)
Urobilinogen, Ur: 0.2 mg/dL (ref 0.2–1.0)

## 2015-09-13 LAB — MICROSCOPIC EXAMINATION: WBC, UA: >30 /hpf — AB (ref 0–?)

## 2015-09-13 LAB — BAYER DCA HB A1C WAIVED: HB A1C (BAYER DCA - WAIVED): 6.9 % (ref <7.0)

## 2015-09-13 MED ORDER — LISINOPRIL 40 MG PO TABS: 40.0000 mg | ORAL_TABLET | Freq: Every day | ORAL | Status: DC

## 2015-09-13 MED ORDER — HYDROCHLOROTHIAZIDE 25 MG PO TABS: 25.0000 mg | ORAL_TABLET | Freq: Every day | ORAL | Status: DC

## 2015-09-13 MED ORDER — ATORVASTATIN CALCIUM 10 MG PO TABS: ORAL_TABLET | ORAL | Status: DC

## 2015-09-13 NOTE — Assessment & Plan Note (Signed)
The current medical regimen is effective;  continue present plan and medications.  

## 2015-09-13 NOTE — Addendum Note (Signed)
Addended byGolden Pop on: 09/13/2015 10:42 AM   Modules accepted: Orders, SmartSet

## 2015-09-13 NOTE — Assessment & Plan Note (Signed)
Followed by cardiology 

## 2015-09-13 NOTE — Progress Notes (Signed)
BP 94/64 mmHg  Pulse 70  Temp(Src) 98.8 F (37.1 C)  Ht 5' 1.3" (1.557 m)  Wt 178 lb (80.74 kg)  BMI 33.31 kg/m2  SpO2 99%   Subjective:    Patient ID: Natalie Gardner, female    DOB: [birth year], 70 y.o.   MRN: [id number]  HPI: Natalie Gardner is a 70 y.o. female  Chief Complaint  Patient presents with  . Annual Exam  Patient all in all doing well has been to cardiology for shortness of breath with review of cardiology notes 3 different fluid pills prescribed being well tolerated and helping with patient's symptoms. Patient has a echocardiogram coming up later this month. Taking Lipitor 10 mg without problems Blood pressure doing well. Patient's biggest complaint is not sleeping at night. No complaints of PND orthopnea On review patient has bizarre sleep schedule going to bed at midnight and alarm goes off at 3:45 AM. Maybe some reported snoring but no observed apnea.  Relevant past medical, surgical, family and social history reviewed and updated as indicated. Interim medical history since our last visit reviewed. Allergies and medications reviewed and updated.  Review of Systems  Constitutional: Negative.   HENT: Negative.   Eyes: Negative.   Respiratory: Negative.   Cardiovascular: Negative.   Gastrointestinal: Negative.   Endocrine: Negative.   Genitourinary: Negative.   Musculoskeletal: Negative.   Skin: Negative.   Allergic/Immunologic: Negative.   Neurological: Negative.   Hematological: Negative.   Psychiatric/Behavioral: Negative.     Per HPI unless specifically indicated above     Objective:    BP 94/64 mmHg  Pulse 70  Temp(Src) 98.8 F (37.1 C)  Ht 5' 1.3" (1.557 m)  Wt 178 lb (80.74 kg)  BMI 33.31 kg/m2  SpO2 99%  Wt Readings from Last 3 Encounters:  09/13/15 178 lb (80.74 kg)  03/16/15 174 lb (78.926 kg)  03/16/15 177 lb (80.287 kg)    Physical Exam  Constitutional: She is oriented to person, place, and time. She appears well-developed  and well-nourished.  HENT:  Head: Normocephalic and atraumatic.  Right Ear: External ear normal.  Left Ear: External ear normal.  Nose: Nose normal.  Mouth/Throat: Oropharynx is clear and moist.  Eyes: Conjunctivae and EOM are normal. Pupils are equal, round, and reactive to light.  Neck: Normal range of motion. Neck supple. Carotid bruit is not present.  Cardiovascular: Normal rate, regular rhythm and normal heart sounds.   No murmur heard. Pulmonary/Chest: Effort normal and breath sounds normal. She exhibits no mass. Right breast exhibits no mass, no skin change and no tenderness. Left breast exhibits no mass, no skin change and no tenderness. Breasts are symmetrical.  Abdominal: Soft. Bowel sounds are normal. There is no hepatosplenomegaly.  Musculoskeletal: Normal range of motion.  Neurological: She is alert and oriented to person, place, and time.  Skin: No rash noted.  Multiple irregular black moles on her back  Psychiatric: She has a normal mood and affect. Her behavior is normal. Judgment and thought content normal.    Results for orders placed or performed in visit on 03/16/15  HM MAMMOGRAPHY  Result Value Ref Range   HM Mammogram [facility]--PP   HM COLONOSCOPY  Result Value Ref Range   HM Colonoscopy NEXT DUE 2017   HM COLONOSCOPY  Result Value Ref Range   HM Colonoscopy [facility]--NEXT DUE 2017--PP       Assessment & Plan:   Problem List Items Addressed This Visit  Cardiovascular and Mediastinum   Essential hypertension   Relevant Medications   atorvastatin (LIPITOR) 10 MG tablet   hydrochlorothiazide (HYDRODIURIL) 25 MG tablet   lisinopril (PRINIVIL,ZESTRIL) 40 MG tablet   CHF (congestive heart failure) (HCC)    Followed by cardiology      Relevant Medications   atorvastatin (LIPITOR) 10 MG tablet   hydrochlorothiazide (HYDRODIURIL) 25 MG tablet   lisinopril (PRINIVIL,ZESTRIL) 40 MG tablet   Atrial fibrillation (HCC)    On warfarin  followed by cardiology      Relevant Medications   atorvastatin (LIPITOR) 10 MG tablet   hydrochlorothiazide (HYDRODIURIL) 25 MG tablet   lisinopril (PRINIVIL,ZESTRIL) 40 MG tablet   Other Relevant Orders   Comprehensive metabolic panel   CBC with Differential/Platelet   TSH   Urinalysis, Routine w reflex microscopic (not at [facility])     Endocrine   Diabetes mellitus without complication (HCC)   Relevant Medications   atorvastatin (LIPITOR) 10 MG tablet   lisinopril (PRINIVIL,ZESTRIL) 40 MG tablet   Other Relevant Orders   Comprehensive metabolic panel   CBC with Differential/Platelet   TSH   Urinalysis, Routine w reflex microscopic (not at [facility])   Bayer [place] Hb A1c Waived     Other   Hyperlipidemia    The current medical regimen is effective;  continue present plan and medications.       Relevant Medications   atorvastatin (LIPITOR) 10 MG tablet   hydrochlorothiazide (HYDRODIURIL) 25 MG tablet   lisinopril (PRINIVIL,ZESTRIL) 40 MG tablet   Other Relevant Orders   Comprehensive metabolic panel   Lipid panel   CBC with Differential/Platelet   TSH   Urinalysis, Routine w reflex microscopic (not at [facility])    Other Visit Diagnoses    Immunization due    -  Primary    Relevant Orders    Pneumococcal conjugate vaccine 13-valent IM (Completed)    Encounter for screening mammogram for breast cancer        Relevant Orders    MM Digital Screening    PE (physical exam), annual        Skin lesions        Skin lesions undetermined significance will refer to dermatology to further evaluate    Relevant Orders    Ambulatory referral to Dermatology        Follow up plan: Return in about 6 months (around 03/14/2016) for a1c, lipids, alt, ast, BMP.

## 2015-09-13 NOTE — Assessment & Plan Note (Signed)
On warfarin followed by cardiology

## 2015-09-14 ENCOUNTER — Encounter: Payer: Self-pay | Admitting: Family Medicine

## 2015-09-14 LAB — COMPREHENSIVE METABOLIC PANEL
A/G RATIO: 1.6 (ref 1.2–2.2)
ALT: 17 IU/L (ref 0–32)
AST: 18 IU/L (ref 0–40)
Albumin: 4.2 g/dL (ref 3.5–4.8)
Alkaline Phosphatase: 73 IU/L (ref 39–117)
BUN/Creatinine Ratio: 23 (ref 12–28)
BUN: 27 mg/dL (ref 8–27)
Bilirubin Total: 0.3 mg/dL (ref 0.0–1.2)
CALCIUM: 9.5 mg/dL (ref 8.7–10.3)
CO2: 20 mmol/L (ref 18–29)
Chloride: 101 mmol/L (ref 96–106)
Creatinine, Ser: 1.16 mg/dL — ABNORMAL HIGH (ref 0.57–1.00)
GFR calc Af Amer: 55 mL/min/{1.73_m2} — ABNORMAL LOW (ref >59)
GFR, EST NON AFRICAN AMERICAN: 48 mL/min/{1.73_m2} — AB (ref >59)
GLUCOSE: 128 mg/dL — AB (ref 65–99)
Globulin, Total: 2.7 g/dL (ref 1.5–4.5)
POTASSIUM: 4.7 mmol/L (ref 3.5–5.2)
Sodium: 138 mmol/L (ref 134–144)
Total Protein: 6.9 g/dL (ref 6.0–8.5)

## 2015-09-14 LAB — CBC WITH DIFFERENTIAL/PLATELET
BASOS ABS: 0 10*3/uL (ref 0.0–0.2)
Basos: 0 %
EOS (ABSOLUTE): 0.1 10*3/uL (ref 0.0–0.4)
EOS: 1 %
HEMATOCRIT: 38.4 % (ref 34.0–46.6)
HEMOGLOBIN: 12.3 g/dL (ref 11.1–15.9)
Immature Grans (Abs): 0 10*3/uL (ref 0.0–0.1)
Immature Granulocytes: 0 %
LYMPHS ABS: 1.7 10*3/uL (ref 0.7–3.1)
Lymphs: 23 %
MCH: 28 pg (ref 26.6–33.0)
MCHC: 32 g/dL (ref 31.5–35.7)
MCV: 88 fL (ref 79–97)
MONOCYTES: 5 %
Monocytes Absolute: 0.4 10*3/uL (ref 0.1–0.9)
NEUTROS ABS: 5 10*3/uL (ref 1.4–7.0)
Neutrophils: 71 %
Platelets: 272 10*3/uL (ref 150–379)
RBC: 4.39 x10E6/uL (ref 3.77–5.28)
RDW: 14.9 % (ref 12.3–15.4)
WBC: 7.1 10*3/uL (ref 3.4–10.8)

## 2015-09-14 LAB — LIPID PANEL
CHOL/HDL RATIO: 3.2 ratio (ref 0.0–4.4)
Cholesterol, Total: 208 mg/dL — ABNORMAL HIGH (ref 100–199)
HDL: 65 mg/dL (ref >39)
LDL Calculated: 120 mg/dL — ABNORMAL HIGH (ref 0–99)
TRIGLYCERIDES: 117 mg/dL (ref 0–149)
VLDL CHOLESTEROL CAL: 23 mg/dL (ref 5–40)

## 2015-09-14 LAB — TSH: TSH: 1.28 u[IU]/mL (ref 0.450–4.500)

## 2015-09-15 ENCOUNTER — Telehealth: Payer: Self-pay | Admitting: Family Medicine

## 2015-09-15 LAB — URINE CULTURE

## 2015-09-15 MED ORDER — CIPROFLOXACIN HCL 250 MG PO TABS: 250.0000 mg | ORAL_TABLET | Freq: Two times a day (BID) | ORAL | Status: DC

## 2015-09-15 NOTE — Telephone Encounter (Signed)
-----   Message from Wynn Maudlin, [place] sent at 09/15/2015  4:45 PM EDT ----- labs

## 2015-09-15 NOTE — Telephone Encounter (Signed)
Phone call Discussed with patient UA positive and suggestive of UTI patient was some mild symptoms urine culture equivocal will give Cipro for a few days.

## 2015-09-17 DIAGNOSIS — I10 Essential (primary) hypertension: Secondary | ICD-10-CM | POA: Diagnosis not present

## 2015-09-17 DIAGNOSIS — I071 Rheumatic tricuspid insufficiency: Secondary | ICD-10-CM | POA: Diagnosis not present

## 2015-09-17 DIAGNOSIS — I482 Chronic atrial fibrillation: Secondary | ICD-10-CM | POA: Diagnosis not present

## 2015-09-17 DIAGNOSIS — I272 Other secondary pulmonary hypertension: Secondary | ICD-10-CM | POA: Diagnosis not present

## 2015-09-17 DIAGNOSIS — R0602 Shortness of breath: Secondary | ICD-10-CM | POA: Diagnosis not present

## 2015-09-27 DIAGNOSIS — Z1231 Encounter for screening mammogram for malignant neoplasm of breast: Secondary | ICD-10-CM | POA: Diagnosis not present

## 2015-09-30 ENCOUNTER — Telehealth: Payer: Self-pay | Admitting: Family Medicine

## 2015-10-04 IMAGING — US US ABDOMEN LIMITED
1 series · 14 of 25 positions shown · non-contrast
Comparison: 12/29/2014 .

CLINICAL DATA: Abnormal CT the abdomen.

EXAM:
US ABDOMEN LIMITED - RIGHT UPPER QUADRANT

[Series 1: us abdomen limited · 0.21mm/px · 14 of 47 slices shown]
[im 1/47]
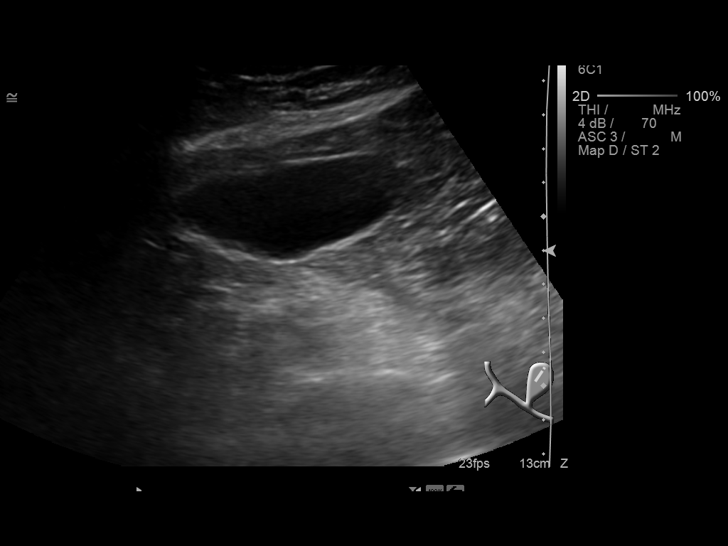
[im 4/47]
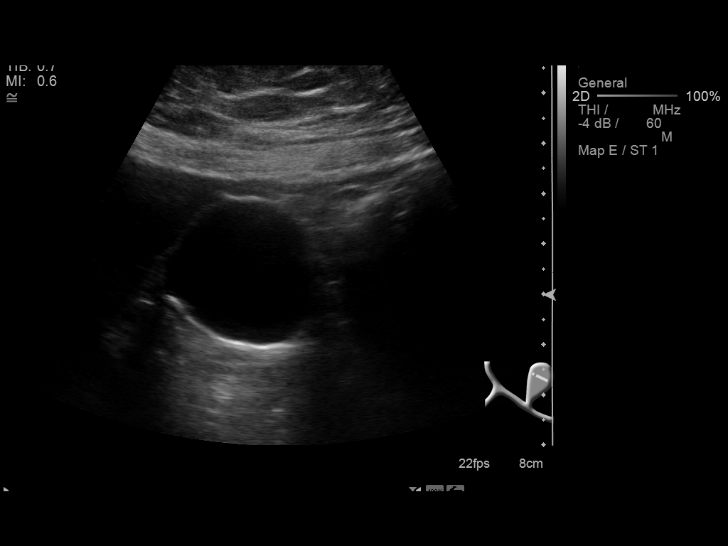
[im 8/47]
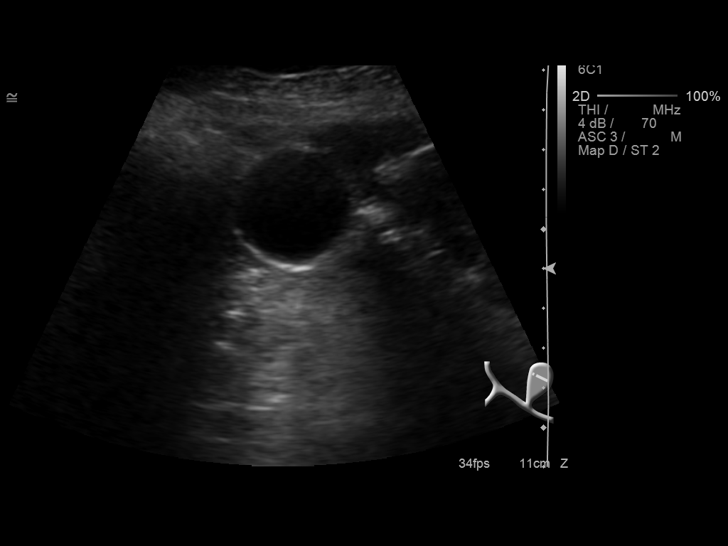
[im 12/47]
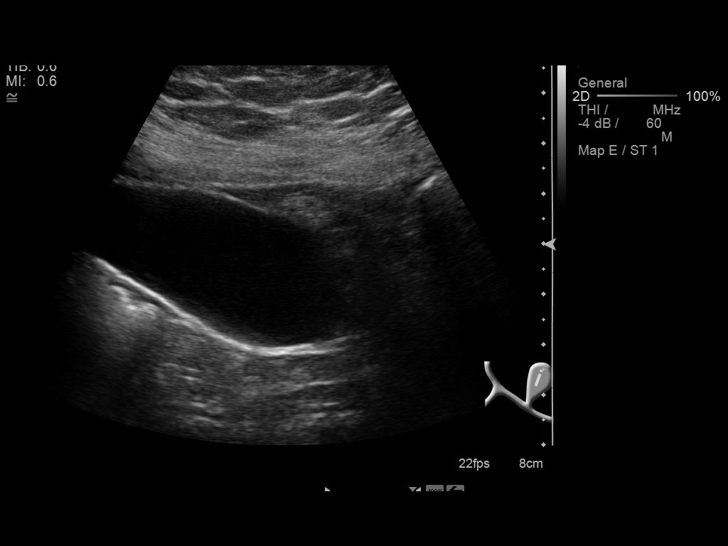
[im 16/47]
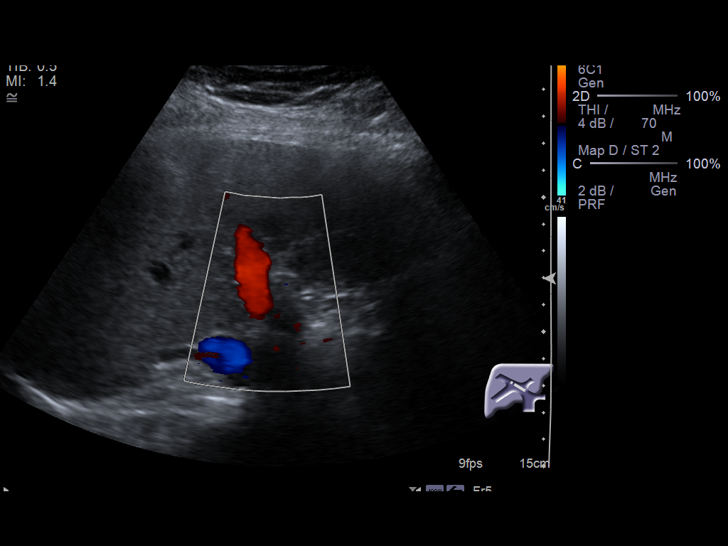
[im 18/47]
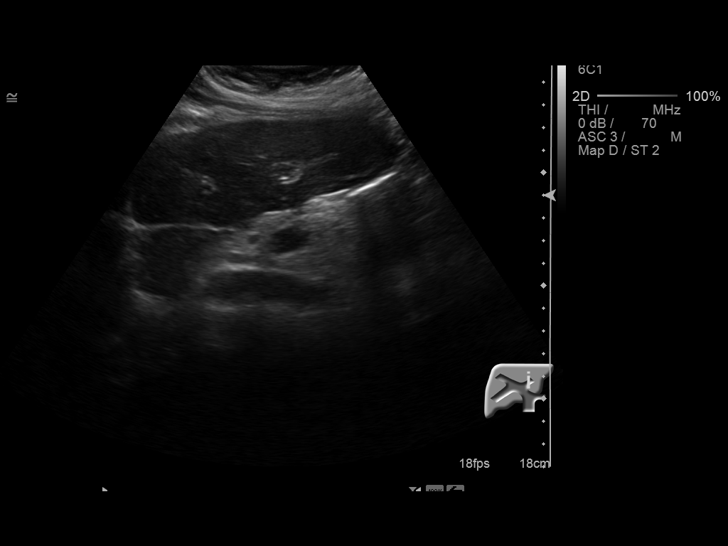
[im 22/47]
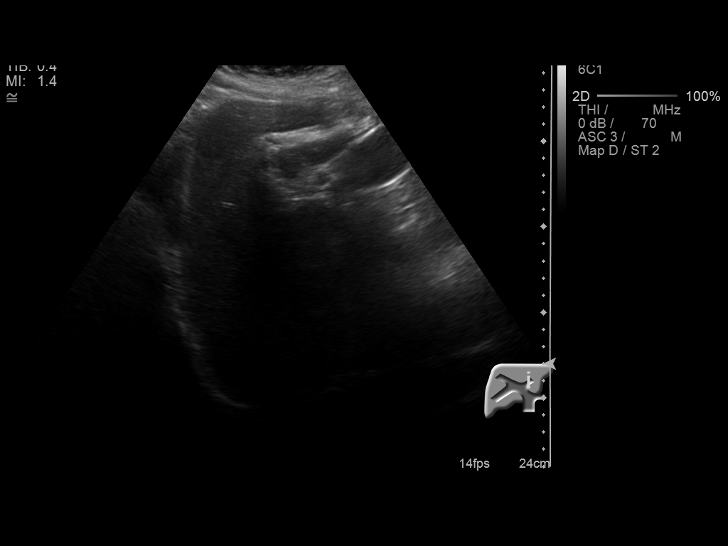
[im 25/47]
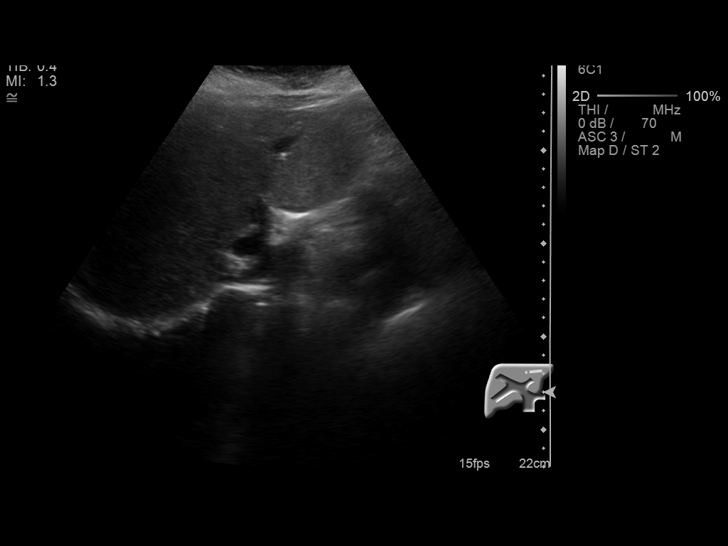
[im 29/47]
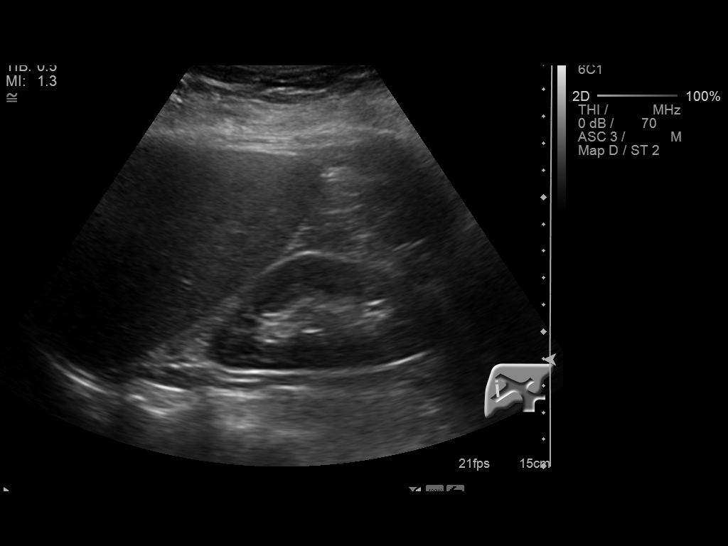
[im 31/47]
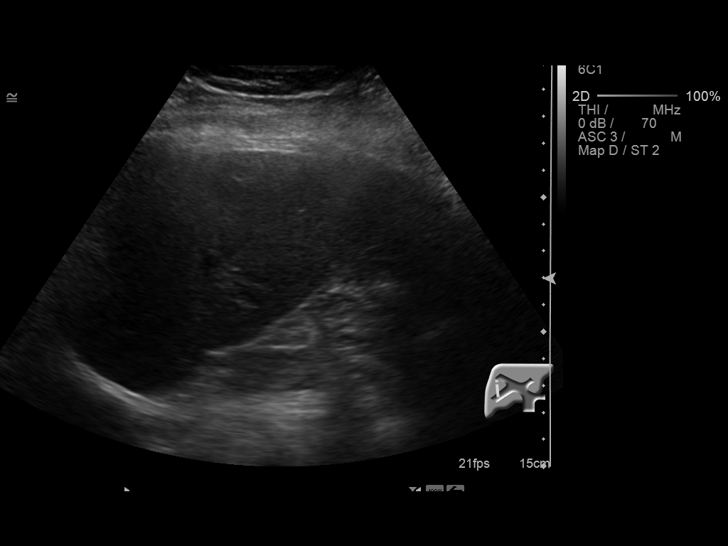
[im 35/47]
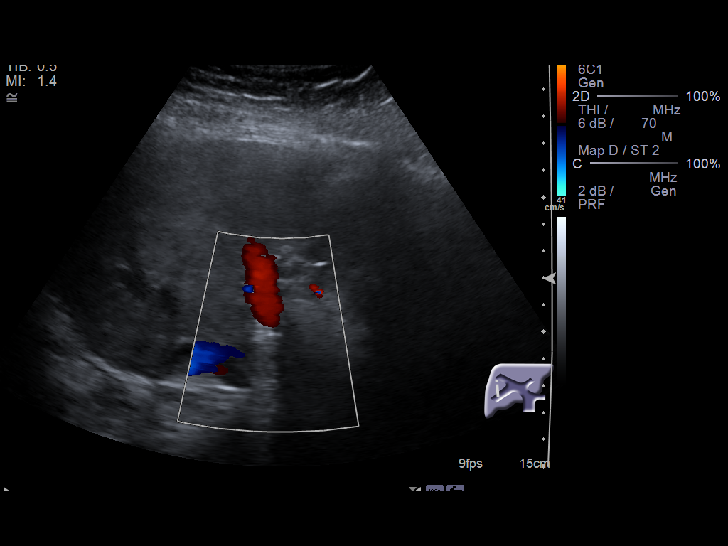
[im 39/47]
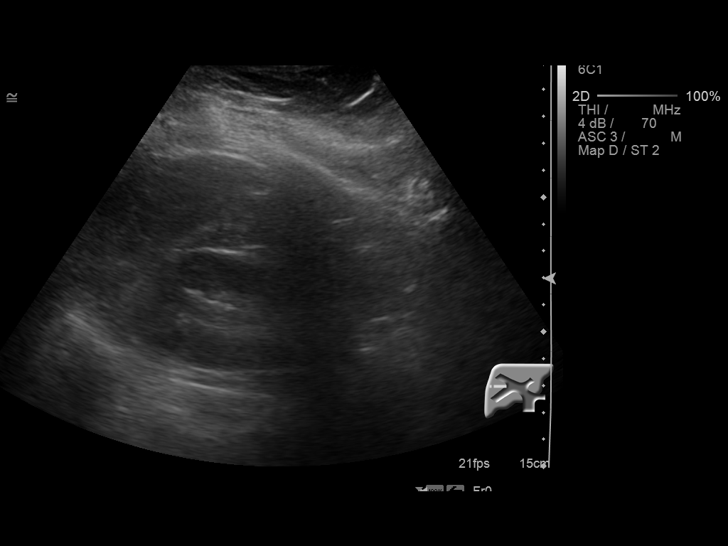
[im 43/47]
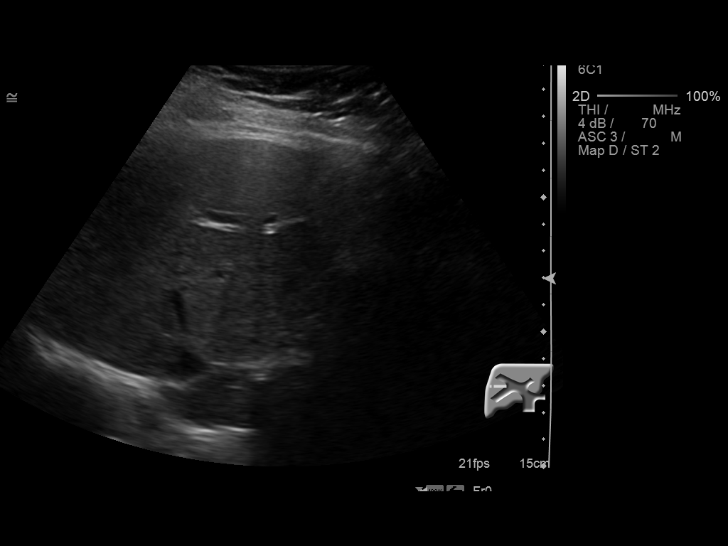
[im 47/47]
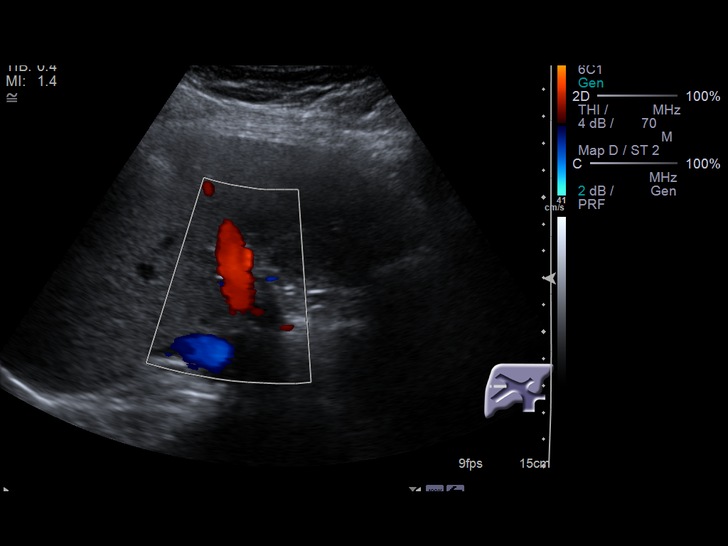

[14 of 25 positions shown; findings below may reference images not displayed]

FINDINGS: Gallbladder:

No gallstones or wall thickening visualized. No sonographic Murphy
sign noted. Previously identified CT abnormality in this region most
likely represents volume averaging.

Common bile duct:

Diameter: 3.2 mm

Liver:

No focal lesion identified. Within normal limits in parenchymal
echogenicity.
IMPRESSION: Normal exam. Previously questioned soft tissue density in the
gallbladder on CT of 12/29/2014 most likely represents volume
averaging.

## 2015-12-16 DIAGNOSIS — D225 Melanocytic nevi of trunk: Secondary | ICD-10-CM | POA: Diagnosis not present

## 2015-12-16 DIAGNOSIS — L918 Other hypertrophic disorders of the skin: Secondary | ICD-10-CM | POA: Diagnosis not present

## 2015-12-16 DIAGNOSIS — L821 Other seborrheic keratosis: Secondary | ICD-10-CM | POA: Diagnosis not present

## 2015-12-16 DIAGNOSIS — L570 Actinic keratosis: Secondary | ICD-10-CM | POA: Diagnosis not present

## 2015-12-16 DIAGNOSIS — D485 Neoplasm of uncertain behavior of skin: Secondary | ICD-10-CM | POA: Diagnosis not present

## 2015-12-16 DIAGNOSIS — D229 Melanocytic nevi, unspecified: Secondary | ICD-10-CM | POA: Diagnosis not present

## 2016-01-05 NOTE — Telephone Encounter (Signed)
error 

## 2016-02-03 DIAGNOSIS — H2513 Age-related nuclear cataract, bilateral: Secondary | ICD-10-CM | POA: Diagnosis not present

## 2016-02-17 DIAGNOSIS — L570 Actinic keratosis: Secondary | ICD-10-CM | POA: Diagnosis not present

## 2016-02-17 DIAGNOSIS — D485 Neoplasm of uncertain behavior of skin: Secondary | ICD-10-CM | POA: Diagnosis not present

## 2016-03-08 ENCOUNTER — Other Ambulatory Visit: Payer: Self-pay | Admitting: Family Medicine

## 2016-03-14 ENCOUNTER — Ambulatory Visit: Payer: Medicare Other | Admitting: Family Medicine

## 2016-03-27 DIAGNOSIS — Z7901 Long term (current) use of anticoagulants: Secondary | ICD-10-CM | POA: Diagnosis not present

## 2016-03-31 ENCOUNTER — Other Ambulatory Visit: Payer: Self-pay | Admitting: Family Medicine

## 2016-03-31 ENCOUNTER — Ambulatory Visit (INDEPENDENT_AMBULATORY_CARE_PROVIDER_SITE_OTHER): Payer: Medicare Other

## 2016-03-31 DIAGNOSIS — Z23 Encounter for immunization: Secondary | ICD-10-CM | POA: Diagnosis not present

## 2016-04-04 ENCOUNTER — Other Ambulatory Visit: Payer: Self-pay | Admitting: Family Medicine

## 2016-04-24 DIAGNOSIS — Z7901 Long term (current) use of anticoagulants: Secondary | ICD-10-CM | POA: Diagnosis not present

## 2016-05-02 ENCOUNTER — Encounter: Payer: Self-pay | Admitting: Family Medicine

## 2016-05-02 ENCOUNTER — Ambulatory Visit (INDEPENDENT_AMBULATORY_CARE_PROVIDER_SITE_OTHER): Payer: Medicare Other | Admitting: Family Medicine

## 2016-05-02 VITALS — BP 101/67 | HR 92 | Temp 98.0°F | Wt 180.4 lb

## 2016-05-02 DIAGNOSIS — J3489 Other specified disorders of nose and nasal sinuses: Secondary | ICD-10-CM | POA: Diagnosis not present

## 2016-05-02 DIAGNOSIS — E784 Other hyperlipidemia: Secondary | ICD-10-CM

## 2016-05-02 DIAGNOSIS — I10 Essential (primary) hypertension: Secondary | ICD-10-CM | POA: Diagnosis not present

## 2016-05-02 DIAGNOSIS — J340 Abscess, furuncle and carbuncle of nose: Secondary | ICD-10-CM

## 2016-05-02 DIAGNOSIS — E119 Type 2 diabetes mellitus without complications: Secondary | ICD-10-CM | POA: Diagnosis not present

## 2016-05-02 DIAGNOSIS — E7849 Other hyperlipidemia: Secondary | ICD-10-CM

## 2016-05-02 LAB — LP+ALT+AST PICCOLO, WAIVED
ALT (SGPT) PICCOLO, WAIVED: 19 U/L (ref 10–47)
AST (SGOT) PICCOLO, WAIVED: 22 U/L (ref 11–38)
CHOLESTEROL PICCOLO, WAIVED: 158 mg/dL (ref <200)
Chol/HDL Ratio Piccolo,Waive: 2.5 mg/dL
HDL CHOL PICCOLO, WAIVED: 63 mg/dL (ref >59)
LDL Chol Calc Piccolo Waived: 64 mg/dL (ref <100)
Triglycerides Piccolo,Waived: 154 mg/dL — ABNORMAL HIGH (ref <150)
VLDL Chol Calc Piccolo,Waive: 31 mg/dL — ABNORMAL HIGH (ref <30)

## 2016-05-02 LAB — BAYER DCA HB A1C WAIVED: HB A1C: 6.5 % (ref <7.0)

## 2016-05-02 MED ORDER — AMOXICILLIN-POT CLAVULANATE 875-125 MG PO TABS: 1.0000 | ORAL_TABLET | Freq: Two times a day (BID) | ORAL | 0 refills | Status: DC

## 2016-05-02 MED ORDER — HYDROCHLOROTHIAZIDE 25 MG PO TABS: 25.0000 mg | ORAL_TABLET | Freq: Every day | ORAL | 2 refills | Status: DC

## 2016-05-02 NOTE — Assessment & Plan Note (Signed)
The current medical regimen is effective;  continue present plan and medications.  

## 2016-05-02 NOTE — Progress Notes (Signed)
BP 101/67 (BP Location: Left Arm, Patient Position: Sitting, Cuff Size: Large)   Pulse 92   Temp 98 F (36.7 C)   Wt 180 lb 6.4 oz (81.8 kg)   SpO2 99%   BMI 33.75 kg/m    Subjective:    Patient ID: Natalie Gardner, female    DOB: 1945-10-04, 70 y.o.   MRN: [id number]  HPI: Natalie Gardner is a 70 y.o. female  Chief Complaint  Patient presents with  . Nose Problem    pt states she has had sores in her nose for about 6 weeks   . Diabetes  . Hyperlipidemia  . Hypertension   Patient with no sores lesions spent ongoing for 6 weeks had some head cold stuff with congestion drainage no other family members or associates with skin lesions or sores nose lesions. Patient's tried over-the-counter antibiotic ointments decongestants etc. with no real relief.  Diabetes doing well noted low blood sugar spells taking medications without problems Blood pressure heart good reports doing well no complaints no fluid retention. Cholesterol doing well with no complaints taking medications without problems.  Relevant past medical, surgical, family and social history reviewed and updated as indicated. Interim medical history since our last visit reviewed. Allergies and medications reviewed and updated.  Review of Systems  Constitutional: Negative.   Respiratory: Negative.   Cardiovascular: Negative.     Per HPI unless specifically indicated above     Objective:    BP 101/67 (BP Location: Left Arm, Patient Position: Sitting, Cuff Size: Large)   Pulse 92   Temp 98 F (36.7 C)   Wt 180 lb 6.4 oz (81.8 kg)   SpO2 99%   BMI 33.75 kg/m   Wt Readings from Last 3 Encounters:  05/02/16 180 lb 6.4 oz (81.8 kg)  09/13/15 178 lb (80.7 kg)  03/16/15 174 lb (78.9 kg)    Physical Exam  Constitutional: She is oriented to person, place, and time. She appears well-developed and well-nourished. No distress.  HENT:  Head: Normocephalic and atraumatic.  Right Ear: Hearing and external ear normal.   Left Ear: Hearing and external ear normal.  Nose: Nose normal.  Mouth/Throat: Oropharynx is clear and moist.  Nose with bilateral crusting and evidence of some slight bleeding with marked inflammation of nasal cavities.  Eyes: Conjunctivae and lids are normal. Right eye exhibits no discharge. Left eye exhibits no discharge. No scleral icterus.  Cardiovascular: Normal heart sounds.   Pulmonary/Chest: Effort normal and breath sounds normal. No respiratory distress.  Musculoskeletal: Normal range of motion.  Neurological: She is alert and oriented to person, place, and time.  Skin: Skin is intact. No rash noted.  Psychiatric: She has a normal mood and affect. Her speech is normal and behavior is normal. Judgment and thought content normal. Cognition and memory are normal.    Results for orders placed or performed in visit on 09/13/15  Urine culture  Result Value Ref Range   Urine Culture, Routine Final report    Urine Culture result 1 Comment   Microscopic Examination  Result Value Ref Range   WBC, UA >30 (A) 0 - 5 /hpf   RBC, UA 3-10 (A) 0 - 2 /hpf   Epithelial Cells (non renal) 0-10 0 - 10 /hpf   Bacteria, UA Few None seen/Few  Comprehensive metabolic panel  Result Value Ref Range   Glucose 128 (H) 65 - 99 mg/dL   BUN 27 8 - 27 mg/dL   Creatinine, Ser 1.16 (  H) 0.57 - 1.00 mg/dL   GFR calc non Af Amer 48 (L) >59 mL/min/1.73   GFR calc Af Amer 55 (L) >59 mL/min/1.73   BUN/Creatinine Ratio 23 12 - 28   Sodium 138 134 - 144 mmol/L   Potassium 4.7 3.5 - 5.2 mmol/L   Chloride 101 96 - 106 mmol/L   CO2 20 18 - 29 mmol/L   Calcium 9.5 8.7 - 10.3 mg/dL   Total Protein 6.9 6.0 - 8.5 g/dL   Albumin 4.2 3.5 - 4.8 g/dL   Globulin, Total 2.7 1.5 - 4.5 g/dL   Albumin/Globulin Ratio 1.6 1.2 - 2.2   Bilirubin Total 0.3 0.0 - 1.2 mg/dL   Alkaline Phosphatase 73 39 - 117 IU/L   AST 18 0 - 40 IU/L   ALT 17 0 - 32 IU/L  Lipid panel  Result Value Ref Range   Cholesterol, Total 208 (H) 100 -  199 mg/dL   Triglycerides 117 0 - 149 mg/dL   HDL 65 >39 mg/dL   VLDL Cholesterol Cal 23 5 - 40 mg/dL   LDL Calculated 120 (H) 0 - 99 mg/dL   Chol/HDL Ratio 3.2 0.0 - 4.4 ratio units  CBC with Differential/Platelet  Result Value Ref Range   WBC 7.1 3.4 - 10.8 x10E3/uL   RBC 4.39 3.77 - 5.28 x10E6/uL   Hemoglobin 12.3 11.1 - 15.9 g/dL   Hematocrit 38.4 34.0 - 46.6 %   MCV 88 79 - 97 fL   MCH 28.0 26.6 - 33.0 pg   MCHC 32.0 31.5 - 35.7 g/dL   RDW 14.9 12.3 - 15.4 %   Platelets 272 150 - 379 x10E3/uL   Neutrophils 71 %   Lymphs 23 %   Monocytes 5 %   Eos 1 %   Basos 0 %   Neutrophils Absolute 5.0 1.4 - 7.0 x10E3/uL   Lymphocytes Absolute 1.7 0.7 - 3.1 x10E3/uL   Monocytes Absolute 0.4 0.1 - 0.9 x10E3/uL   EOS (ABSOLUTE) 0.1 0.0 - 0.4 x10E3/uL   Basophils Absolute 0.0 0.0 - 0.2 x10E3/uL   Immature Granulocytes 0 %   Immature Grans (Abs) 0.0 0.0 - 0.1 x10E3/uL  TSH  Result Value Ref Range   TSH 1.280 0.450 - 4.500 uIU/mL  Urinalysis, Routine w reflex microscopic (not at [facility])  Result Value Ref Range   Specific Gravity, UA 1.015 1.005 - 1.030   pH, UA 5.5 5.0 - 7.5   Color, UA Yellow Yellow   Appearance Ur Cloudy (A) Clear   Leukocytes, UA 3+ (A) Negative   Protein, UA Negative Negative/Trace   Glucose, UA Negative Negative   Ketones, UA Negative Negative   RBC, UA 1+ (A) Negative   Bilirubin, UA Negative Negative   Urobilinogen, Ur 0.2 0.2 - 1.0 mg/dL   Nitrite, UA Negative Negative   Microscopic Examination See below:   Bayer DCA Hb A1c Waived  Result Value Ref Range   Bayer DCA Hb A1c Waived 6.9 <7.0 %      Assessment & Plan:   Problem List Items Addressed This Visit      Cardiovascular and Mediastinum   Essential hypertension - Primary    The current medical regimen is effective;  continue present plan and medications.       Relevant Medications   warfarin (COUMADIN) 2.5 MG tablet   metoprolol succinate (TOPROL-XL) 50 MG 24 hr tablet   diltiazem (DILACOR  XR) 180 MG 24 hr capsule   spironolactone (ALDACTONE) 25 MG tablet  hydrochlorothiazide (HYDRODIURIL) 25 MG tablet   Other Relevant Orders   Basic metabolic panel     Endocrine   Diabetes mellitus without complication ([place])    The current medical regimen is effective;  continue present plan and medications.       Relevant Orders   Bayer DCA Hb A1c Waived     Other   Hyperlipidemia    The current medical regimen is effective;  continue present plan and medications.       Relevant Medications   warfarin (COUMADIN) 2.5 MG tablet   metoprolol succinate (TOPROL-XL) 50 MG 24 hr tablet   diltiazem (DILACOR XR) 180 MG 24 hr capsule   spironolactone (ALDACTONE) 25 MG tablet   hydrochlorothiazide (HYDRODIURIL) 25 MG tablet   Other Relevant Orders   LP+ALT+AST [place], Waived    Other Visit Diagnoses    Cellulitis of mucous membrane of nose       Discuss cellulitis care and treatment use of Augmentin with cautions about side effects area and if no improvement ENT referral.   Relevant Medications   amoxicillin-clavulanate (AUGMENTIN) 875-125 MG tablet       Follow up plan: Return for Physical Exam in April.

## 2016-05-03 ENCOUNTER — Encounter: Payer: Self-pay | Admitting: Family Medicine

## 2016-05-03 LAB — BASIC METABOLIC PANEL
BUN/Creatinine Ratio: 24 (ref 12–28)
BUN: 22 mg/dL (ref 8–27)
CALCIUM: 9.1 mg/dL (ref 8.7–10.3)
CO2: 22 mmol/L (ref 18–29)
CREATININE: 0.9 mg/dL (ref 0.57–1.00)
Chloride: 105 mmol/L (ref 96–106)
GFR, EST AFRICAN AMERICAN: 75 mL/min/{1.73_m2} (ref >59)
GFR, EST NON AFRICAN AMERICAN: 65 mL/min/{1.73_m2} (ref >59)
Glucose: 130 mg/dL — ABNORMAL HIGH (ref 65–99)
Potassium: 4.4 mmol/L (ref 3.5–5.2)
Sodium: 143 mmol/L (ref 134–144)

## 2016-05-24 DIAGNOSIS — Z7901 Long term (current) use of anticoagulants: Secondary | ICD-10-CM | POA: Diagnosis not present

## 2016-06-02 ENCOUNTER — Other Ambulatory Visit: Payer: Self-pay | Admitting: Family Medicine

## 2016-06-08 DIAGNOSIS — R05 Cough: Secondary | ICD-10-CM | POA: Diagnosis not present

## 2016-06-08 DIAGNOSIS — B349 Viral infection, unspecified: Secondary | ICD-10-CM | POA: Diagnosis not present

## 2016-06-20 DIAGNOSIS — Z7901 Long term (current) use of anticoagulants: Secondary | ICD-10-CM | POA: Diagnosis not present

## 2016-07-18 DIAGNOSIS — Z7901 Long term (current) use of anticoagulants: Secondary | ICD-10-CM | POA: Diagnosis not present

## 2016-08-08 DIAGNOSIS — Z7901 Long term (current) use of anticoagulants: Secondary | ICD-10-CM | POA: Diagnosis not present

## 2016-09-06 DIAGNOSIS — Z7901 Long term (current) use of anticoagulants: Secondary | ICD-10-CM | POA: Diagnosis not present

## 2016-09-11 DIAGNOSIS — Z7901 Long term (current) use of anticoagulants: Secondary | ICD-10-CM | POA: Diagnosis not present

## 2016-09-11 DIAGNOSIS — Z5181 Encounter for therapeutic drug level monitoring: Secondary | ICD-10-CM | POA: Diagnosis not present

## 2016-09-14 ENCOUNTER — Encounter: Payer: Self-pay | Admitting: Family Medicine

## 2016-09-28 ENCOUNTER — Encounter: Payer: Self-pay | Admitting: Family Medicine

## 2016-09-28 ENCOUNTER — Ambulatory Visit (INDEPENDENT_AMBULATORY_CARE_PROVIDER_SITE_OTHER): Payer: Medicare Other | Admitting: Family Medicine

## 2016-09-28 VITALS — BP 109/73 | HR 68 | Ht 62.99 in | Wt 178.0 lb

## 2016-09-28 DIAGNOSIS — Z0000 Encounter for general adult medical examination without abnormal findings: Secondary | ICD-10-CM

## 2016-09-28 DIAGNOSIS — Z7189 Other specified counseling: Secondary | ICD-10-CM | POA: Insufficient documentation

## 2016-09-28 DIAGNOSIS — E784 Other hyperlipidemia: Secondary | ICD-10-CM

## 2016-09-28 DIAGNOSIS — Z13220 Encounter for screening for lipoid disorders: Secondary | ICD-10-CM | POA: Diagnosis not present

## 2016-09-28 DIAGNOSIS — E7849 Other hyperlipidemia: Secondary | ICD-10-CM

## 2016-09-28 DIAGNOSIS — I482 Chronic atrial fibrillation, unspecified: Secondary | ICD-10-CM

## 2016-09-28 DIAGNOSIS — I509 Heart failure, unspecified: Secondary | ICD-10-CM

## 2016-09-28 DIAGNOSIS — I10 Essential (primary) hypertension: Secondary | ICD-10-CM

## 2016-09-28 DIAGNOSIS — M1712 Unilateral primary osteoarthritis, left knee: Secondary | ICD-10-CM

## 2016-09-28 DIAGNOSIS — Z1329 Encounter for screening for other suspected endocrine disorder: Secondary | ICD-10-CM | POA: Diagnosis not present

## 2016-09-28 DIAGNOSIS — E119 Type 2 diabetes mellitus without complications: Secondary | ICD-10-CM | POA: Diagnosis not present

## 2016-09-28 LAB — URINALYSIS, ROUTINE W REFLEX MICROSCOPIC
BILIRUBIN UA: NEGATIVE
Glucose, UA: NEGATIVE
Ketones, UA: NEGATIVE
Nitrite, UA: NEGATIVE
PH UA: 5 (ref 5.0–7.5)
Specific Gravity, UA: 1.025 (ref 1.005–1.030)
Urobilinogen, Ur: 0.2 mg/dL (ref 0.2–1.0)

## 2016-09-28 LAB — MICROSCOPIC EXAMINATION

## 2016-09-28 MED ORDER — HYDROCHLOROTHIAZIDE 25 MG PO TABS: 25.0000 mg | ORAL_TABLET | Freq: Every day | ORAL | 4 refills | Status: DC

## 2016-09-28 MED ORDER — ATORVASTATIN CALCIUM 10 MG PO TABS: ORAL_TABLET | ORAL | 4 refills | Status: DC

## 2016-09-28 MED ORDER — LISINOPRIL 40 MG PO TABS: 40.0000 mg | ORAL_TABLET | Freq: Every day | ORAL | 4 refills | Status: DC

## 2016-09-28 NOTE — Assessment & Plan Note (Signed)
The current medical regimen is effective;  continue present plan and medications.  

## 2016-09-28 NOTE — Progress Notes (Signed)
BP 109/73   Pulse 68   Ht 5' 2.99" (1.6 m)   Wt 178 lb (80.7 kg)   SpO2 98%   BMI 31.54 kg/m    Subjective:    Patient ID: Natalie Gardner, female    DOB: June 27, 1945, 71 y.o.   MRN: [id number]  HPI: Natalie Gardner is a 71 y.o. female  Chief Complaint  Patient presents with  . Annual Exam   AWV metrics met  Concerned took Bactroban ointment for no sores and resolved but they're now back. Wants to have something to treat them again. No sores in axilla or groin areas Blood pressure doing well no issues with medications. Cholesterol doing well no issues with medications. Coronary fibrillation using warfarin no problems being monitored by cardiology and stable. No bleeding bruising or issues  Relevant past medical, surgical, family and social history reviewed and updated as indicated. Interim medical history since our last visit reviewed. Allergies and medications reviewed and updated.  Review of Systems  Constitutional: Negative.   HENT: Negative.   Eyes: Negative.   Respiratory: Negative.   Cardiovascular: Negative.   Gastrointestinal: Negative.   Endocrine: Negative.   Genitourinary: Negative.   Musculoskeletal: Negative.   Skin: Negative.   Allergic/Immunologic: Negative.   Neurological: Negative.   Hematological: Negative.   Psychiatric/Behavioral: Negative.     Per HPI unless specifically indicated above     Objective:    BP 109/73   Pulse 68   Ht 5' 2.99" (1.6 m)   Wt 178 lb (80.7 kg)   SpO2 98%   BMI 31.54 kg/m   Wt Readings from Last 3 Encounters:  09/28/16 178 lb (80.7 kg)  05/02/16 180 lb 6.4 oz (81.8 kg)  09/13/15 178 lb (80.7 kg)    Physical Exam  Constitutional: She is oriented to person, place, and time. She appears well-developed and well-nourished.  HENT:  Head: Normocephalic and atraumatic.  Right Ear: External ear normal.  Left Ear: External ear normal.  Nose: Nose normal.  Mouth/Throat: Oropharynx is clear and moist.  Eyes:  Conjunctivae and EOM are normal. Pupils are equal, round, and reactive to light.  Neck: Normal range of motion. Neck supple. Carotid bruit is not present.  Cardiovascular: Normal rate, regular rhythm and normal heart sounds.   No murmur heard. Pulmonary/Chest: Effort normal and breath sounds normal. She exhibits no mass. Right breast exhibits no mass, no skin change and no tenderness. Left breast exhibits no mass, no skin change and no tenderness. Breasts are symmetrical.  Abdominal: Soft. Bowel sounds are normal. There is no hepatosplenomegaly.  Musculoskeletal: Normal range of motion.  Neurological: She is alert and oriented to person, place, and time.  Skin: No rash noted.  Psychiatric: She has a normal mood and affect. Her behavior is normal. Judgment and thought content normal.    Results for orders placed or performed in visit on 05/02/16  Bayer DCA Hb A1c Waived  Result Value Ref Range   Bayer DCA Hb A1c Waived 6.5 <7.0 %  LP+ALT+AST Piccolo, Waived  Result Value Ref Range   ALT (SGPT) Piccolo, Waived 19 10 - 47 U/L   AST (SGOT) Piccolo, Waived 22 11 - 38 U/L   Cholesterol Piccolo, Waived 158 <200 mg/dL   HDL Chol Piccolo, Waived 63 >59 mg/dL   Triglycerides Piccolo,Waived 154 (H) <150 mg/dL   Chol/HDL Ratio Piccolo,Waive 2.5 mg/dL   LDL Chol Calc Piccolo Waived 64 <100 mg/dL   VLDL Chol Calc Piccolo,Waive 31 (H) <  30 mg/dL  Basic metabolic panel  Result Value Ref Range   Glucose 130 (H) 65 - 99 mg/dL   BUN 22 8 - 27 mg/dL   Creatinine, Ser 0.90 0.57 - 1.00 mg/dL   GFR calc non Af Amer 65 >59 mL/min/1.73   GFR calc Af Amer 75 >59 mL/min/1.73   BUN/Creatinine Ratio 24 12 - 28   Sodium 143 134 - 144 mmol/L   Potassium 4.4 3.5 - 5.2 mmol/L   Chloride 105 96 - 106 mmol/L   CO2 22 18 - 29 mmol/L   Calcium 9.1 8.7 - 10.3 mg/dL      Assessment & Plan:   Problem List Items Addressed This Visit      Cardiovascular and Mediastinum   Essential hypertension    The current  medical regimen is effective;  continue present plan and medications.       Relevant Medications   atorvastatin (LIPITOR) 10 MG tablet   hydrochlorothiazide (HYDRODIURIL) 25 MG tablet   lisinopril (PRINIVIL,ZESTRIL) 40 MG tablet   Other Relevant Orders   CBC with Differential/Platelet   Comprehensive metabolic panel   Urinalysis, Routine w reflex microscopic   CHF (congestive heart failure) (HCC)    Stable followed by cardiology      Relevant Medications   atorvastatin (LIPITOR) 10 MG tablet   hydrochlorothiazide (HYDRODIURIL) 25 MG tablet   lisinopril (PRINIVIL,ZESTRIL) 40 MG tablet   Atrial fibrillation (HCC)    Followed by cardiology and stable      Relevant Medications   atorvastatin (LIPITOR) 10 MG tablet   hydrochlorothiazide (HYDRODIURIL) 25 MG tablet   lisinopril (PRINIVIL,ZESTRIL) 40 MG tablet     Endocrine   Diabetes mellitus without complication (HCC)    The current medical regimen is effective;  continue present plan and medications.       Relevant Medications   atorvastatin (LIPITOR) 10 MG tablet   lisinopril (PRINIVIL,ZESTRIL) 40 MG tablet   Other Relevant Orders   CBC with Differential/Platelet   Comprehensive metabolic panel   Urinalysis, Routine w reflex microscopic   Hgb A1c w/o eAG     Musculoskeletal and Integument   Left knee DJD    Worsening pain patient's left knee interfering with lifestyle and ability to walk and move. Patient ready to go to orthopedics will make an appointment.      Relevant Orders   Ambulatory referral to Orthopedic Surgery     Other   Hyperlipidemia    The current medical regimen is effective;  continue present plan and medications.       Relevant Medications   atorvastatin (LIPITOR) 10 MG tablet   hydrochlorothiazide (HYDRODIURIL) 25 MG tablet   lisinopril (PRINIVIL,ZESTRIL) 40 MG tablet   Other Relevant Orders   CBC with Differential/Platelet   Comprehensive metabolic panel   Lipid panel   Urinalysis,  Routine w reflex microscopic   Advanced care planning/counseling discussion    A voluntary discussion about advance care planning including the explanation and discussion of advance directives was extensively discussed  with the patient.  Explanation about the health care proxy and Living will was reviewed and packet with forms with explanation of how to fill them out was given.   Patient was offered a separate [place] visit for further assistance with forms.          Other Visit Diagnoses    Annual physical exam    -  Primary   Relevant Orders   CBC with Differential/Platelet   Comprehensive  metabolic panel   Lipid panel   TSH   Urinalysis, Routine w reflex microscopic   Screening cholesterol level       Relevant Orders   Lipid panel   Thyroid disorder screen       Relevant Orders   TSH       Follow up plan: Return in about 6 months (around 03/31/2017) for Hemoglobin A1c.

## 2016-09-28 NOTE — Assessment & Plan Note (Signed)
Stable followed by cardiology

## 2016-09-28 NOTE — Assessment & Plan Note (Signed)
Followed by cardiology and stable 

## 2016-09-28 NOTE — Assessment & Plan Note (Signed)
A voluntary discussion about advance care planning including the explanation and discussion of advance directives was extensively discussed  with the patient.  Explanation about the health care proxy and Living will was reviewed and packet with forms with explanation of how to fill them out was given.  .  Patient was offered a separate Advance Care Planning visit for further assistance with forms.    

## 2016-09-28 NOTE — Assessment & Plan Note (Signed)
Worsening pain patient's left knee interfering with lifestyle and ability to walk and move. Patient ready to go to orthopedics will make an appointment.

## 2016-09-29 LAB — CBC WITH DIFFERENTIAL/PLATELET
Basophils Absolute: 0 10*3/uL (ref 0.0–0.2)
Basos: 0 %
EOS (ABSOLUTE): 0.1 10*3/uL (ref 0.0–0.4)
EOS: 2 %
HEMATOCRIT: 35.6 % (ref 34.0–46.6)
HEMOGLOBIN: 11.2 g/dL (ref 11.1–15.9)
IMMATURE GRANS (ABS): 0 10*3/uL (ref 0.0–0.1)
IMMATURE GRANULOCYTES: 0 %
LYMPHS: 22 %
Lymphocytes Absolute: 1.8 10*3/uL (ref 0.7–3.1)
MCH: 25.5 pg — AB (ref 26.6–33.0)
MCHC: 31.5 g/dL (ref 31.5–35.7)
MCV: 81 fL (ref 79–97)
MONOCYTES: 6 %
MONOS ABS: 0.5 10*3/uL (ref 0.1–0.9)
NEUTROS PCT: 70 %
Neutrophils Absolute: 5.7 10*3/uL (ref 1.4–7.0)
Platelets: 343 10*3/uL (ref 150–379)
RBC: 4.39 x10E6/uL (ref 3.77–5.28)
RDW: 16 % — AB (ref 12.3–15.4)
WBC: 8.2 10*3/uL (ref 3.4–10.8)

## 2016-09-29 LAB — TSH: TSH: 1.5 u[IU]/mL (ref 0.450–4.500)

## 2016-09-29 LAB — COMPREHENSIVE METABOLIC PANEL
ALK PHOS: 77 IU/L (ref 39–117)
ALT: 15 IU/L (ref 0–32)
AST: 16 IU/L (ref 0–40)
Albumin/Globulin Ratio: 1.3 (ref 1.2–2.2)
Albumin: 4.1 g/dL (ref 3.5–4.8)
BILIRUBIN TOTAL: 0.3 mg/dL (ref 0.0–1.2)
BUN / CREAT RATIO: 29 — AB (ref 12–28)
BUN: 32 mg/dL — ABNORMAL HIGH (ref 8–27)
CHLORIDE: 98 mmol/L (ref 96–106)
CO2: 19 mmol/L (ref 18–29)
CREATININE: 1.11 mg/dL — AB (ref 0.57–1.00)
Calcium: 9.1 mg/dL (ref 8.7–10.3)
GFR calc Af Amer: 58 mL/min/{1.73_m2} — ABNORMAL LOW (ref >59)
GFR calc non Af Amer: 50 mL/min/{1.73_m2} — ABNORMAL LOW (ref >59)
GLOBULIN, TOTAL: 3.1 g/dL (ref 1.5–4.5)
GLUCOSE: 136 mg/dL — AB (ref 65–99)
Potassium: 4.8 mmol/L (ref 3.5–5.2)
SODIUM: 135 mmol/L (ref 134–144)
Total Protein: 7.2 g/dL (ref 6.0–8.5)

## 2016-09-29 LAB — LIPID PANEL
CHOLESTEROL TOTAL: 179 mg/dL (ref 100–199)
Chol/HDL Ratio: 3.1 ratio (ref 0.0–4.4)
HDL: 57 mg/dL (ref >39)
LDL CALC: 90 mg/dL (ref 0–99)
TRIGLYCERIDES: 162 mg/dL — AB (ref 0–149)
VLDL Cholesterol Cal: 32 mg/dL (ref 5–40)

## 2016-09-29 LAB — HGB A1C W/O EAG: HEMOGLOBIN A1C: 7 % — AB (ref 4.8–5.6)

## 2016-10-02 ENCOUNTER — Telehealth: Payer: Self-pay | Admitting: Family Medicine

## 2016-10-02 NOTE — Telephone Encounter (Signed)
Phone call Discussed with patient renal function declining again had been slightly declined a year ago then back to normal 6 months ago and now slight decline again. Patient on warfarin and avoiding aspirin Advil and Aleve other nonsteroidal drugs. Patient not aware of any other nephrotoxic agents. Will observe recheck BMP next office visit.

## 2016-10-04 DIAGNOSIS — Z7901 Long term (current) use of anticoagulants: Secondary | ICD-10-CM | POA: Diagnosis not present

## 2016-10-26 DIAGNOSIS — M25562 Pain in left knee: Secondary | ICD-10-CM | POA: Diagnosis not present

## 2016-10-26 DIAGNOSIS — M1712 Unilateral primary osteoarthritis, left knee: Secondary | ICD-10-CM | POA: Diagnosis not present

## 2016-10-30 ENCOUNTER — Other Ambulatory Visit: Payer: Self-pay | Admitting: Family Medicine

## 2016-10-30 NOTE — Telephone Encounter (Signed)
Last OV: 09/28/16 Next OV: 04/10/17  BMP Latest Ref Rng & Units 09/28/2016 05/02/2016 09/13/2015  Glucose 65 - 99 mg/dL 136(H) 130(H) 128(H)  BUN 8 - 27 mg/dL 32(H) 22 27  Creatinine 0.57 - 1.00 mg/dL 1.11(H) 0.90 1.16(H)  BUN/Creat Ratio 12 - 28 29(H) 24 23  Sodium 134 - 144 mmol/L 135 143 138  Potassium 3.5 - 5.2 mmol/L 4.8 4.4 4.7  Chloride 96 - 106 mmol/L 98 105 101  CO2 18 - 29 mmol/L 19 22 20   Calcium 8.7 - 10.3 mg/dL 9.1 9.1 9.5

## 2016-10-31 DIAGNOSIS — I10 Essential (primary) hypertension: Secondary | ICD-10-CM | POA: Diagnosis not present

## 2016-10-31 DIAGNOSIS — E782 Mixed hyperlipidemia: Secondary | ICD-10-CM | POA: Diagnosis not present

## 2016-10-31 DIAGNOSIS — I482 Chronic atrial fibrillation: Secondary | ICD-10-CM | POA: Diagnosis not present

## 2016-10-31 DIAGNOSIS — I340 Nonrheumatic mitral (valve) insufficiency: Secondary | ICD-10-CM | POA: Diagnosis not present

## 2016-11-01 DIAGNOSIS — Z7901 Long term (current) use of anticoagulants: Secondary | ICD-10-CM | POA: Diagnosis not present

## 2016-11-17 DIAGNOSIS — M1712 Unilateral primary osteoarthritis, left knee: Secondary | ICD-10-CM | POA: Diagnosis not present

## 2016-11-30 DIAGNOSIS — Z7901 Long term (current) use of anticoagulants: Secondary | ICD-10-CM | POA: Diagnosis not present

## 2016-12-04 ENCOUNTER — Other Ambulatory Visit: Payer: Self-pay | Admitting: Family Medicine

## 2016-12-11 DIAGNOSIS — D1800 Hemangioma unspecified site: Secondary | ICD-10-CM | POA: Diagnosis not present

## 2016-12-11 DIAGNOSIS — L578 Other skin changes due to chronic exposure to nonionizing radiation: Secondary | ICD-10-CM | POA: Diagnosis not present

## 2016-12-11 DIAGNOSIS — D499 Neoplasm of unspecified behavior of unspecified site: Secondary | ICD-10-CM | POA: Diagnosis not present

## 2016-12-11 DIAGNOSIS — D485 Neoplasm of uncertain behavior of skin: Secondary | ICD-10-CM | POA: Diagnosis not present

## 2016-12-11 DIAGNOSIS — D229 Melanocytic nevi, unspecified: Secondary | ICD-10-CM | POA: Diagnosis not present

## 2016-12-11 DIAGNOSIS — Z1283 Encounter for screening for malignant neoplasm of skin: Secondary | ICD-10-CM | POA: Diagnosis not present

## 2016-12-27 DIAGNOSIS — C801 Malignant (primary) neoplasm, unspecified: Secondary | ICD-10-CM

## 2016-12-27 HISTORY — DX: Malignant (primary) neoplasm, unspecified: C80.1

## 2016-12-28 DIAGNOSIS — Z7901 Long term (current) use of anticoagulants: Secondary | ICD-10-CM | POA: Diagnosis not present

## 2017-01-16 DIAGNOSIS — D2361 Other benign neoplasm of skin of right upper limb, including shoulder: Secondary | ICD-10-CM | POA: Diagnosis not present

## 2017-01-16 DIAGNOSIS — D485 Neoplasm of uncertain behavior of skin: Secondary | ICD-10-CM | POA: Diagnosis not present

## 2017-01-23 DIAGNOSIS — D229 Melanocytic nevi, unspecified: Secondary | ICD-10-CM | POA: Diagnosis not present

## 2017-01-23 DIAGNOSIS — D2261 Melanocytic nevi of right upper limb, including shoulder: Secondary | ICD-10-CM | POA: Diagnosis not present

## 2017-01-30 DIAGNOSIS — Z7901 Long term (current) use of anticoagulants: Secondary | ICD-10-CM | POA: Diagnosis not present

## 2017-01-31 ENCOUNTER — Encounter
Admission: RE | Admit: 2017-01-31 | Discharge: 2017-01-31 | Disposition: A | Discharge status: Home / Self Care | Payer: Medicare Other | Source: Ambulatory Visit | Attending: Orthopedic Surgery | Admitting: Orthopedic Surgery

## 2017-01-31 DIAGNOSIS — R9431 Abnormal electrocardiogram [ECG] [EKG]: Secondary | ICD-10-CM | POA: Diagnosis not present

## 2017-01-31 DIAGNOSIS — Z01812 Encounter for preprocedural laboratory examination: Secondary | ICD-10-CM | POA: Insufficient documentation

## 2017-01-31 DIAGNOSIS — Z01810 Encounter for preprocedural cardiovascular examination: Secondary | ICD-10-CM | POA: Diagnosis not present

## 2017-01-31 DIAGNOSIS — I10 Essential (primary) hypertension: Secondary | ICD-10-CM | POA: Insufficient documentation

## 2017-01-31 DIAGNOSIS — I4891 Unspecified atrial fibrillation: Secondary | ICD-10-CM | POA: Insufficient documentation

## 2017-01-31 HISTORY — DX: Malignant (primary) neoplasm, unspecified: C80.1

## 2017-01-31 HISTORY — DX: Prediabetes: R73.03

## 2017-01-31 HISTORY — DX: Dizziness and giddiness: R42

## 2017-01-31 HISTORY — DX: Cardiac arrhythmia, unspecified: I49.9

## 2017-01-31 HISTORY — DX: Diverticulosis of intestine, part unspecified, without perforation or abscess without bleeding: K57.90

## 2017-01-31 HISTORY — DX: Unspecified osteoarthritis, unspecified site: M19.90

## 2017-01-31 LAB — CBC
HEMATOCRIT: 36.2 % (ref 35.0–47.0)
Hemoglobin: 11.9 g/dL — ABNORMAL LOW (ref 12.0–16.0)
MCH: 27.3 pg (ref 26.0–34.0)
MCHC: 32.8 g/dL (ref 32.0–36.0)
MCV: 83.2 fL (ref 80.0–100.0)
PLATELETS: 301 10*3/uL (ref 150–440)
RBC: 4.35 MIL/uL (ref 3.80–5.20)
RDW: 16.6 % — AB (ref 11.5–14.5)
WBC: 9.5 10*3/uL (ref 3.6–11.0)

## 2017-01-31 LAB — URINALYSIS, ROUTINE W REFLEX MICROSCOPIC
BILIRUBIN URINE: NEGATIVE
Bacteria, UA: NONE SEEN
Glucose, UA: NEGATIVE mg/dL
Ketones, ur: NEGATIVE mg/dL
LEUKOCYTES UA: NEGATIVE
NITRITE: NEGATIVE
PH: 5 (ref 5.0–8.0)
Protein, ur: NEGATIVE mg/dL
SPECIFIC GRAVITY, URINE: 1.013 (ref 1.005–1.030)
SQUAMOUS EPITHELIAL / LPF: NONE SEEN

## 2017-01-31 LAB — COMPREHENSIVE METABOLIC PANEL
ALBUMIN: 3.9 g/dL (ref 3.5–5.0)
ALT: 21 U/L (ref 14–54)
ANION GAP: 10 (ref 5–15)
AST: 23 U/L (ref 15–41)
Alkaline Phosphatase: 71 U/L (ref 38–126)
BILIRUBIN TOTAL: 0.6 mg/dL (ref 0.3–1.2)
BUN: 27 mg/dL — AB (ref 6–20)
CHLORIDE: 104 mmol/L (ref 101–111)
CO2: 21 mmol/L — ABNORMAL LOW (ref 22–32)
Calcium: 9.4 mg/dL (ref 8.9–10.3)
Creatinine, Ser: 1.17 mg/dL — ABNORMAL HIGH (ref 0.44–1.00)
GFR calc Af Amer: 53 mL/min — ABNORMAL LOW (ref >60)
GFR calc non Af Amer: 46 mL/min — ABNORMAL LOW (ref >60)
GLUCOSE: 131 mg/dL — AB (ref 65–99)
POTASSIUM: 5.1 mmol/L (ref 3.5–5.1)
Sodium: 135 mmol/L (ref 135–145)
TOTAL PROTEIN: 7.8 g/dL (ref 6.5–8.1)

## 2017-01-31 LAB — TYPE AND SCREEN
ABO/RH(D): A NEG
Antibody Screen: NEGATIVE

## 2017-01-31 LAB — C-REACTIVE PROTEIN

## 2017-01-31 LAB — SURGICAL PCR SCREEN
MRSA, PCR: NEGATIVE
STAPHYLOCOCCUS AUREUS: NEGATIVE

## 2017-01-31 LAB — APTT: APTT: 42 s — AB (ref 24–36)

## 2017-01-31 LAB — PROTIME-INR
INR: 2.52
PROTHROMBIN TIME: 27 s — AB (ref 11.4–15.2)

## 2017-01-31 LAB — SEDIMENTATION RATE: SED RATE: 36 mm/h — AB (ref 0–30)

## 2017-01-31 NOTE — Pre-Procedure Instructions (Signed)
EKG COMPARED WITH PREVIOUS AND CARDIAC CLEARANCE BY DR Nehemiah Massed 6/18 ON CHART

## 2017-01-31 NOTE — Patient Instructions (Signed)
Your procedure is scheduled on: February 12 2017 [facility])   Report to Same Day Surgery 2nd floor medical mall ([place] Entrance-take elevator on left to 2nd floor.  Check in with surgery information desk.) To find out your arrival time please call [phone] between 1PM - 3PM on February 09, 2017 (FRIDAY ) Remember: Instructions that are not followed completely may result in serious medical risk, up to and including death, or upon the discretion of your surgeon and anesthesiologist your surgery may need to be rescheduled.    _x___ 1. Do not eat food after midnight the night before your procedure. You may drink clear liquids up to 2 hours before you are scheduled to arrive at the hospital for your procedure.  Do not drink clear liquids within 2 hours of your scheduled arrival to the hospital.  Clear liquids include  --Water or Apple juice without pulp  --Clear carbohydrate beverage such as ClearFast or Gatorade  --Black Coffee or Clear Tea (No milk, no creamers, do not add anything to                  the coffee or Tea Type 1 and type 2 diabetics should only drink water.  No gum chewing or hard candies.     __x__ 2. No Alcohol for 24 hours before or after surgery.   __x__3. No Smoking for 24 prior to surgery.   ____  4. Bring all medications with you on the day of surgery if instructed.    __x__ 5. Notify your doctor if there is any change in your medical condition     (cold, fever, infections).     Do not wear jewelry, make-up, hairpins, clips or nail polish.  Do not wear lotions, powders, or perfumes.   Do not shave 48 hours prior to surgery. Men may shave face and neck.  Do not bring valuables to the hospital.    [facility] is not responsible for any belongings or valuables.               Contacts, dentures or bridgework may not be worn into surgery.  Leave your suitcase in the car. After surgery it may be brought to your room.  For patients admitted to the hospital,  discharge time is determined by your treatment team                       Patients discharged the day of surgery will not be allowed to drive home.  You will need someone to drive you home and stay with you the night of your procedure.    Please read over the following fact sheets that you were given:   [facility] Preparing for Surgery and or MRSA Information  TAKE THE FOLLOWING MEDICATIONS WITH A SIP OF WATER THE MORNING OF SURGERY :  1. METOPROLOL  2. RANITIDINE  3.RANITIDINE AT BEDTIME ON SEPTEMBER 16    4. DILITIAZEM   .  ____Fleets enema or Magnesium Citrate as directed.   _x___ Use CHG Soap or sage wipes as directed on instruction sheet   ____ Use inhalers on the day of surgery and bring to hospital day of surgery  ____ Stop Metformin and Janumet 2 days prior to surgery.    ____ Take 1/2 of usual insulin dose the night before surgery and none on the morning surgery.      _x___ Follow recommendations from Cardiologist, Pulmonologist or PCP regarding  stopping Aspirin, Coumadin, Plavix ,Eliquis, Effient, or Pradaxa, and Pletal. (CALL DR Nehemiah Massed OFFICE AND ASK ABOUT STOPPING WARFARIN )  X____Stop Anti-inflammatories such as Advil, Aleve, Ibuprofen, Motrin, Naproxen, Naprosyn, Goodies powders or aspirin products. OK to take Tylenol    _x___ Stop supplements until after surgery.  But may continue Vitamin D, Vitamin B,  AND MULTIVITAMIN (STOP FISH OIL NOW )    ____ Bring C-Pap to the hospital.

## 2017-02-01 LAB — HEMOGLOBIN A1C
HEMOGLOBIN A1C: 6.2 % — AB (ref 4.8–5.6)
Mean Plasma Glucose: 131.24 mg/dL

## 2017-02-01 NOTE — Pre-Procedure Instructions (Signed)
LABS FAXED TO DR HOOTEN 

## 2017-02-02 DIAGNOSIS — R0602 Shortness of breath: Secondary | ICD-10-CM | POA: Diagnosis not present

## 2017-02-02 DIAGNOSIS — I482 Chronic atrial fibrillation: Secondary | ICD-10-CM | POA: Diagnosis not present

## 2017-02-02 DIAGNOSIS — I4891 Unspecified atrial fibrillation: Secondary | ICD-10-CM | POA: Diagnosis not present

## 2017-02-02 DIAGNOSIS — I10 Essential (primary) hypertension: Secondary | ICD-10-CM | POA: Diagnosis not present

## 2017-02-02 LAB — URINE CULTURE
Culture: <10000 — AB
Special Requests: NORMAL

## 2017-02-02 LAB — IGE: IGE (IMMUNOGLOBULIN E), SERUM: 21 [IU]/mL (ref 0–100)

## 2017-02-02 NOTE — Pre-Procedure Instructions (Signed)
HgbA1C results sent to Dr. Hooten for review. 

## 2017-02-07 DIAGNOSIS — I482 Chronic atrial fibrillation: Secondary | ICD-10-CM | POA: Diagnosis not present

## 2017-02-11 MED ORDER — TRANEXAMIC ACID 1000 MG/10ML IV SOLN
1000.0000 mg | INTRAVENOUS | Status: DC
  Filled 2017-02-11: qty 10

## 2017-02-11 MED ORDER — CLINDAMYCIN PHOSPHATE 900 MG/50ML IV SOLN: 900.0000 mg | INTRAVENOUS | Status: DC

## 2017-02-11 NOTE — Discharge Instructions (Signed)
°  Instructions after Total Knee Replacement ° ° Natalie Gardner P. Zaire Vanbuskirk, Jr., M.D.    ° Dept. of Orthopaedics & Sports Medicine ° [facility] ° [address] ° [place], [place]  [address] ° Phone: [phone]   Fax: [phone] ° °  °DIET: °• Drink plenty of non-alcoholic fluids. °• Resume your normal diet. Include foods high in fiber. ° °ACTIVITY:  °• You may use crutches or a walker with weight-bearing as tolerated, unless instructed otherwise. °• You may be weaned off of the walker or crutches by your Physical Therapist.  °• Do NOT place pillows under the knee. Anything placed under the knee could limit your ability to straighten the knee.   °• Continue doing gentle exercises. Exercising will reduce the pain and swelling, increase motion, and prevent muscle weakness.   °• Please continue to use the TED compression stockings for 6 weeks. You may remove the stockings at night, but should reapply them in the morning. °• Do not drive or operate any equipment until instructed. ° °WOUND CARE:  °• Continue to use the PolarCare or ice packs periodically to reduce pain and swelling. °• You may bathe or shower after the staples are removed at the first office visit following surgery. ° °MEDICATIONS: °• You may resume your regular medications. °• Please take the pain medication as prescribed on the medication. °• Do not take pain medication on an empty stomach. °• You have been given a prescription for a blood thinner (Lovenox or Coumadin). Please take the medication as instructed. (NOTE: After completing a 2 week course of Lovenox, take one Enteric-coated aspirin once a day. This along with elevation will help reduce the possibility of phlebitis in your operated leg.) °• Do not drive or drink alcoholic beverages when taking pain medications. ° °CALL THE OFFICE FOR: °• Temperature above 101 degrees °• Excessive bleeding or drainage on the dressing. °• Excessive swelling, coldness, or paleness of the toes. °• Persistent  nausea and vomiting. ° °FOLLOW-UP:  °• You should have an appointment to return to the office in 10-14 days after surgery. °• Arrangements have been made for continuation of Physical Therapy (either home therapy or outpatient therapy). °  °

## 2017-02-12 ENCOUNTER — Inpatient Hospital Stay: Payer: Medicare Other | Admitting: Anesthesiology

## 2017-02-12 ENCOUNTER — Inpatient Hospital Stay: Payer: Medicare Other

## 2017-02-12 ENCOUNTER — Inpatient Hospital Stay
Admission: RE | Admit: 2017-02-12 | Discharge: 2017-02-14 | DRG: 470 | Disposition: A | Discharge status: Home Health | Payer: Medicare Other | Source: Ambulatory Visit | Attending: Orthopedic Surgery | Admitting: Orthopedic Surgery

## 2017-02-12 ENCOUNTER — Encounter: Payer: Self-pay | Admitting: Orthopedic Surgery

## 2017-02-12 ENCOUNTER — Encounter
Admission: RE | Disposition: A | Discharge status: Home Health | Payer: Self-pay | Source: Ambulatory Visit | Attending: Orthopedic Surgery

## 2017-02-12 DIAGNOSIS — I130 Hypertensive heart and chronic kidney disease with heart failure and stage 1 through stage 4 chronic kidney disease, or unspecified chronic kidney disease: Secondary | ICD-10-CM | POA: Diagnosis not present

## 2017-02-12 DIAGNOSIS — N189 Chronic kidney disease, unspecified: Secondary | ICD-10-CM | POA: Diagnosis present

## 2017-02-12 DIAGNOSIS — I509 Heart failure, unspecified: Secondary | ICD-10-CM | POA: Diagnosis present

## 2017-02-12 DIAGNOSIS — E1122 Type 2 diabetes mellitus with diabetic chronic kidney disease: Secondary | ICD-10-CM | POA: Diagnosis present

## 2017-02-12 DIAGNOSIS — Z8711 Personal history of peptic ulcer disease: Secondary | ICD-10-CM | POA: Diagnosis not present

## 2017-02-12 DIAGNOSIS — Z7901 Long term (current) use of anticoagulants: Secondary | ICD-10-CM | POA: Diagnosis not present

## 2017-02-12 DIAGNOSIS — Z471 Aftercare following joint replacement surgery: Secondary | ICD-10-CM | POA: Diagnosis not present

## 2017-02-12 DIAGNOSIS — Z880 Allergy status to penicillin: Secondary | ICD-10-CM | POA: Diagnosis not present

## 2017-02-12 DIAGNOSIS — I4891 Unspecified atrial fibrillation: Secondary | ICD-10-CM | POA: Diagnosis present

## 2017-02-12 DIAGNOSIS — Z96659 Presence of unspecified artificial knee joint: Secondary | ICD-10-CM

## 2017-02-12 DIAGNOSIS — Z96652 Presence of left artificial knee joint: Secondary | ICD-10-CM | POA: Diagnosis not present

## 2017-02-12 DIAGNOSIS — K219 Gastro-esophageal reflux disease without esophagitis: Secondary | ICD-10-CM | POA: Diagnosis not present

## 2017-02-12 DIAGNOSIS — E785 Hyperlipidemia, unspecified: Secondary | ICD-10-CM | POA: Diagnosis present

## 2017-02-12 DIAGNOSIS — Z79899 Other long term (current) drug therapy: Secondary | ICD-10-CM | POA: Diagnosis not present

## 2017-02-12 DIAGNOSIS — Z23 Encounter for immunization: Secondary | ICD-10-CM | POA: Diagnosis present

## 2017-02-12 DIAGNOSIS — I351 Nonrheumatic aortic (valve) insufficiency: Secondary | ICD-10-CM | POA: Diagnosis present

## 2017-02-12 DIAGNOSIS — I420 Dilated cardiomyopathy: Secondary | ICD-10-CM | POA: Diagnosis present

## 2017-02-12 DIAGNOSIS — Z882 Allergy status to sulfonamides status: Secondary | ICD-10-CM

## 2017-02-12 DIAGNOSIS — M1712 Unilateral primary osteoarthritis, left knee: Secondary | ICD-10-CM | POA: Diagnosis not present

## 2017-02-12 DIAGNOSIS — M8580 Other specified disorders of bone density and structure, unspecified site: Secondary | ICD-10-CM | POA: Diagnosis not present

## 2017-02-12 DIAGNOSIS — Z888 Allergy status to other drugs, medicaments and biological substances status: Secondary | ICD-10-CM | POA: Diagnosis not present

## 2017-02-12 HISTORY — PX: KNEE ARTHROPLASTY: SHX992

## 2017-02-12 LAB — GLUCOSE, CAPILLARY
Glucose-Capillary: 114 mg/dL — ABNORMAL HIGH (ref 65–99)
Glucose-Capillary: 74 mg/dL (ref 65–99)

## 2017-02-12 LAB — PROTIME-INR
INR: 1.23
PROTHROMBIN TIME: 15.4 s — AB (ref 11.4–15.2)

## 2017-02-12 LAB — ABO/RH: ABO/RH(D): A NEG

## 2017-02-12 LAB — APTT: APTT: 28 s (ref 24–36)

## 2017-02-12 SURGERY — ARTHROPLASTY, KNEE, TOTAL, USING IMAGELESS COMPUTER-ASSISTED NAVIGATION
Anesthesia: Spinal | Laterality: Left

## 2017-02-12 MED ORDER — BUPIVACAINE HCL (PF) 0.5 % IJ SOLN
INTRAMUSCULAR | Status: DC | PRN
  Administered 2017-02-12: 2.5 mL

## 2017-02-12 MED ORDER — LISINOPRIL 40 MG PO TABS
40.0000 mg | ORAL_TABLET | Freq: Every day | ORAL | Status: DC
  Administered 2017-02-13 – 2017-02-14 (×2): 40 mg via ORAL
  Filled 2017-02-12: qty 1
  Filled 2017-02-12: qty 2
  Filled 2017-02-12: qty 1

## 2017-02-12 MED ORDER — DIPHENHYDRAMINE HCL 12.5 MG/5ML PO ELIX: 12.5000 mg | ORAL_SOLUTION | ORAL | Status: DC | PRN

## 2017-02-12 MED ORDER — PHENYLEPHRINE HCL 10 MG/ML IJ SOLN
INTRAMUSCULAR | Status: DC | PRN
  Administered 2017-02-12: 40 ug/min via INTRAVENOUS

## 2017-02-12 MED ORDER — BISACODYL 10 MG RE SUPP: 10.0000 mg | Freq: Every day | RECTAL | Status: DC | PRN

## 2017-02-12 MED ORDER — HYDROCHLOROTHIAZIDE 25 MG PO TABS
25.0000 mg | ORAL_TABLET | Freq: Every day | ORAL | Status: DC
  Administered 2017-02-13 – 2017-02-14 (×2): 25 mg via ORAL
  Filled 2017-02-12 (×2): qty 1

## 2017-02-12 MED ORDER — MENTHOL 3 MG MT LOZG
1.0000 | LOZENGE | OROMUCOSAL | Status: DC | PRN
  Filled 2017-02-12: qty 9

## 2017-02-12 MED ORDER — BUPIVACAINE HCL (PF) 0.25 % IJ SOLN
INTRAMUSCULAR | Status: AC
  Filled 2017-02-12: qty 60

## 2017-02-12 MED ORDER — SODIUM CHLORIDE 0.9 % IJ SOLN
INTRAMUSCULAR | Status: AC
  Filled 2017-02-12: qty 50

## 2017-02-12 MED ORDER — OXYCODONE HCL 5 MG PO TABS
5.0000 mg | ORAL_TABLET | ORAL | Status: DC | PRN
  Administered 2017-02-12 – 2017-02-14 (×4): 5 mg via ORAL
  Filled 2017-02-12 (×5): qty 1

## 2017-02-12 MED ORDER — FAMOTIDINE 20 MG PO TABS
20.0000 mg | ORAL_TABLET | Freq: Every day | ORAL | Status: DC
  Administered 2017-02-13 – 2017-02-14 (×2): 20 mg via ORAL
  Filled 2017-02-12 (×2): qty 1

## 2017-02-12 MED ORDER — INFLUENZA VAC SPLIT HIGH-DOSE 0.5 ML IM SUSY
0.5000 mL | PREFILLED_SYRINGE | INTRAMUSCULAR | Status: AC
  Administered 2017-02-13: 0.5 mL via INTRAMUSCULAR
  Filled 2017-02-12 (×2): qty 0.5

## 2017-02-12 MED ORDER — TETRACAINE HCL 1 % IJ SOLN
INTRAMUSCULAR | Status: DC | PRN
  Administered 2017-02-12: 5 mg via INTRASPINAL

## 2017-02-12 MED ORDER — FERROUS SULFATE 325 (65 FE) MG PO TABS
325.0000 mg | ORAL_TABLET | Freq: Two times a day (BID) | ORAL | Status: DC
  Administered 2017-02-12 – 2017-02-14 (×4): 325 mg via ORAL
  Filled 2017-02-12 (×4): qty 1

## 2017-02-12 MED ORDER — PROPOFOL 500 MG/50ML IV EMUL
INTRAVENOUS | Status: AC
  Filled 2017-02-12: qty 50

## 2017-02-12 MED ORDER — ATORVASTATIN CALCIUM 10 MG PO TABS
10.0000 mg | ORAL_TABLET | Freq: Every day | ORAL | Status: DC
  Administered 2017-02-12 – 2017-02-13 (×2): 10 mg via ORAL
  Filled 2017-02-12 (×2): qty 1

## 2017-02-12 MED ORDER — ONDANSETRON HCL 4 MG/2ML IJ SOLN: 4.0000 mg | Freq: Four times a day (QID) | INTRAMUSCULAR | Status: DC | PRN

## 2017-02-12 MED ORDER — SPIRONOLACTONE 25 MG PO TABS
25.0000 mg | ORAL_TABLET | Freq: Every evening | ORAL | Status: DC
  Administered 2017-02-13: 25 mg via ORAL
  Filled 2017-02-12 (×2): qty 1

## 2017-02-12 MED ORDER — SENNOSIDES-DOCUSATE SODIUM 8.6-50 MG PO TABS
1.0000 | ORAL_TABLET | Freq: Two times a day (BID) | ORAL | Status: DC
  Administered 2017-02-12 – 2017-02-14 (×3): 1 via ORAL
  Filled 2017-02-12 (×3): qty 1

## 2017-02-12 MED ORDER — ACETAMINOPHEN 10 MG/ML IV SOLN
INTRAVENOUS | Status: AC
  Filled 2017-02-12: qty 100

## 2017-02-12 MED ORDER — NEOMYCIN-POLYMYXIN B GU 40-200000 IR SOLN
Status: DC | PRN
  Administered 2017-02-12: 14 mL

## 2017-02-12 MED ORDER — DILTIAZEM HCL ER COATED BEADS 180 MG PO CP24
180.0000 mg | ORAL_CAPSULE | Freq: Every day | ORAL | Status: DC
Start: 2017-02-13 — End: 2017-02-14
  Administered 2017-02-13 – 2017-02-14 (×2): 180 mg via ORAL
  Filled 2017-02-12 (×2): qty 1

## 2017-02-12 MED ORDER — ACETAMINOPHEN 325 MG PO TABS: 650.0000 mg | ORAL_TABLET | Freq: Four times a day (QID) | ORAL | Status: DC | PRN

## 2017-02-12 MED ORDER — WARFARIN SODIUM 5 MG PO TABS: 5.0000 mg | ORAL_TABLET | Freq: Once | ORAL | Status: DC

## 2017-02-12 MED ORDER — ONDANSETRON HCL 4 MG PO TABS: 4.0000 mg | ORAL_TABLET | Freq: Four times a day (QID) | ORAL | Status: DC | PRN

## 2017-02-12 MED ORDER — METOPROLOL SUCCINATE ER 50 MG PO TB24
50.0000 mg | ORAL_TABLET | Freq: Every evening | ORAL | Status: DC
  Administered 2017-02-13: 50 mg via ORAL
  Filled 2017-02-12: qty 1

## 2017-02-12 MED ORDER — MAGNESIUM HYDROXIDE 400 MG/5ML PO SUSP
30.0000 mL | Freq: Every day | ORAL | Status: DC | PRN
  Administered 2017-02-12: 30 mL via ORAL
  Filled 2017-02-12: qty 30

## 2017-02-12 MED ORDER — PROPOFOL 500 MG/50ML IV EMUL
INTRAVENOUS | Status: DC | PRN
  Administered 2017-02-12: 50 ug/kg/min via INTRAVENOUS

## 2017-02-12 MED ORDER — BUPIVACAINE HCL (PF) 0.25 % IJ SOLN
INTRAMUSCULAR | Status: DC | PRN
  Administered 2017-02-12: 60 mL

## 2017-02-12 MED ORDER — EPHEDRINE SULFATE 50 MG/ML IJ SOLN
INTRAMUSCULAR | Status: DC | PRN
  Administered 2017-02-12: 10 mg via INTRAVENOUS
  Administered 2017-02-12: 5 mg via INTRAVENOUS

## 2017-02-12 MED ORDER — CLINDAMYCIN PHOSPHATE 600 MG/50ML IV SOLN
600.0000 mg | Freq: Four times a day (QID) | INTRAVENOUS | Status: AC
  Administered 2017-02-12: 600 mg via INTRAVENOUS
  Administered 2017-02-12: 900 mg via INTRAVENOUS
  Administered 2017-02-13: 600 mg via INTRAVENOUS
  Filled 2017-02-12 (×5): qty 50

## 2017-02-12 MED ORDER — CHLORHEXIDINE GLUCONATE 4 % EX LIQD: 60.0000 mL | Freq: Once | CUTANEOUS | Status: DC

## 2017-02-12 MED ORDER — WARFARIN SODIUM 7.5 MG PO TABS
7.5000 mg | ORAL_TABLET | ORAL | Status: DC
  Administered 2017-02-12: 7.5 mg via ORAL
  Filled 2017-02-12: qty 1

## 2017-02-12 MED ORDER — MIDAZOLAM HCL 2 MG/2ML IJ SOLN
INTRAMUSCULAR | Status: AC
  Filled 2017-02-12: qty 2

## 2017-02-12 MED ORDER — PHENOL 1.4 % MT LIQD
1.0000 | OROMUCOSAL | Status: DC | PRN
  Filled 2017-02-12: qty 177

## 2017-02-12 MED ORDER — FENTANYL CITRATE (PF) 100 MCG/2ML IJ SOLN: 25.0000 ug | INTRAMUSCULAR | Status: DC | PRN

## 2017-02-12 MED ORDER — SODIUM CHLORIDE 0.9 % IV SOLN
INTRAVENOUS | Status: DC
  Administered 2017-02-12 (×3): via INTRAVENOUS

## 2017-02-12 MED ORDER — WARFARIN SODIUM 5 MG PO TABS
5.0000 mg | ORAL_TABLET | ORAL | Status: DC
  Administered 2017-02-13: 5 mg via ORAL
  Filled 2017-02-12: qty 1

## 2017-02-12 MED ORDER — EPHEDRINE SULFATE 50 MG/ML IJ SOLN
INTRAMUSCULAR | Status: AC
Start: 2017-02-12 — End: 2017-02-12
  Filled 2017-02-12: qty 1

## 2017-02-12 MED ORDER — ADULT MULTIVITAMIN W/MINERALS CH
1.0000 | ORAL_TABLET | Freq: Every day | ORAL | Status: DC
  Administered 2017-02-13 – 2017-02-14 (×2): 1 via ORAL
  Filled 2017-02-12 (×2): qty 1

## 2017-02-12 MED ORDER — MORPHINE SULFATE (PF) 4 MG/ML IV SOLN: 2.0000 mg | INTRAVENOUS | Status: DC | PRN

## 2017-02-12 MED ORDER — SODIUM CHLORIDE 0.9 % IV SOLN
INTRAVENOUS | Status: DC | PRN
  Administered 2017-02-12: 60 mL

## 2017-02-12 MED ORDER — NEOMYCIN-POLYMYXIN B GU 40-200000 IR SOLN
Status: AC
  Filled 2017-02-12: qty 14

## 2017-02-12 MED ORDER — FUROSEMIDE 20 MG PO TABS: 20.0000 mg | ORAL_TABLET | Freq: Every day | ORAL | Status: DC | PRN

## 2017-02-12 MED ORDER — SODIUM CHLORIDE 0.9 % IV SOLN
1000.0000 mg | Freq: Once | INTRAVENOUS | Status: AC
  Administered 2017-02-12: 1000 mg via INTRAVENOUS
  Filled 2017-02-12 (×2): qty 10

## 2017-02-12 MED ORDER — SODIUM CHLORIDE 0.9 % IV SOLN
INTRAVENOUS | Status: DC
  Administered 2017-02-12: 11:00:00 via INTRAVENOUS

## 2017-02-12 MED ORDER — MIDAZOLAM HCL 5 MG/5ML IJ SOLN
INTRAMUSCULAR | Status: DC | PRN
  Administered 2017-02-12: 2 mg via INTRAVENOUS

## 2017-02-12 MED ORDER — ACETAMINOPHEN 650 MG RE SUPP: 650.0000 mg | Freq: Four times a day (QID) | RECTAL | Status: DC | PRN

## 2017-02-12 MED ORDER — ONDANSETRON HCL 4 MG/2ML IJ SOLN: 4.0000 mg | Freq: Once | INTRAMUSCULAR | Status: DC | PRN

## 2017-02-12 MED ORDER — BUPIVACAINE LIPOSOME 1.3 % IJ SUSP
INTRAMUSCULAR | Status: AC
  Filled 2017-02-12: qty 20

## 2017-02-12 MED ORDER — OMEGA-3-ACID ETHYL ESTERS 1 G PO CAPS
1000.0000 mg | ORAL_CAPSULE | Freq: Every day | ORAL | Status: DC
  Administered 2017-02-13 – 2017-02-14 (×2): 1000 mg via ORAL
  Filled 2017-02-12 (×2): qty 1

## 2017-02-12 MED ORDER — CLINDAMYCIN PHOSPHATE 900 MG/50ML IV SOLN
INTRAVENOUS | Status: AC
  Filled 2017-02-12: qty 50

## 2017-02-12 MED ORDER — TRAMADOL HCL 50 MG PO TABS: 50.0000 mg | ORAL_TABLET | ORAL | Status: DC | PRN

## 2017-02-12 MED ORDER — ALUM & MAG HYDROXIDE-SIMETH 200-200-20 MG/5ML PO SUSP: 30.0000 mL | ORAL | Status: DC | PRN

## 2017-02-12 MED ORDER — BUPIVACAINE HCL (PF) 0.5 % IJ SOLN
INTRAMUSCULAR | Status: AC
  Filled 2017-02-12: qty 10

## 2017-02-12 MED ORDER — WARFARIN - PHARMACIST DOSING INPATIENT: Freq: Every day | Status: DC

## 2017-02-12 MED ORDER — FLEET ENEMA 7-19 GM/118ML RE ENEM: 1.0000 | ENEMA | Freq: Once | RECTAL | Status: DC | PRN

## 2017-02-12 MED ORDER — PROPOFOL 10 MG/ML IV BOLUS
INTRAVENOUS | Status: AC
  Filled 2017-02-12: qty 20

## 2017-02-12 MED ORDER — PROPOFOL 10 MG/ML IV BOLUS
INTRAVENOUS | Status: DC | PRN
  Administered 2017-02-12: 60 mg via INTRAVENOUS

## 2017-02-12 MED ORDER — SUCRALFATE 1 G PO TABS
1.0000 g | ORAL_TABLET | Freq: Two times a day (BID) | ORAL | Status: DC
  Administered 2017-02-12 – 2017-02-14 (×4): 1 g via ORAL
  Filled 2017-02-12 (×4): qty 1

## 2017-02-12 MED ORDER — METOCLOPRAMIDE HCL 10 MG PO TABS
10.0000 mg | ORAL_TABLET | Freq: Three times a day (TID) | ORAL | Status: DC
  Administered 2017-02-12 – 2017-02-14 (×5): 10 mg via ORAL
  Filled 2017-02-12 (×5): qty 1

## 2017-02-12 MED ORDER — ACETAMINOPHEN 10 MG/ML IV SOLN
1000.0000 mg | Freq: Four times a day (QID) | INTRAVENOUS | Status: AC
  Administered 2017-02-12 – 2017-02-13 (×3): 1000 mg via INTRAVENOUS
  Filled 2017-02-12 (×6): qty 100

## 2017-02-12 SURGICAL SUPPLY — 70 items
BATTERY INSTRU NAVIGATION (MISCELLANEOUS) ×12 IMPLANT
BLADE SAW 1 (BLADE) ×3 IMPLANT
BLADE SAW 1/2 (BLADE) ×3 IMPLANT
BLADE SAW 70X12.5 (BLADE) IMPLANT
BONE CEMENT GENTAMICIN (Cement) ×6 IMPLANT
CANISTER SUCT 1200ML W/VALVE (MISCELLANEOUS) ×3 IMPLANT
CANISTER SUCT 3000ML PPV (MISCELLANEOUS) ×6 IMPLANT
CAPT KNEE TOTAL 3 ATTUNE ×3 IMPLANT
CATH TRAY METER 16FR LF (MISCELLANEOUS) ×3 IMPLANT
CEMENT BONE GENTAMICIN 40 (Cement) ×2 IMPLANT
COOLER POLAR GLACIER W/PUMP (MISCELLANEOUS) ×3 IMPLANT
CUFF TOURN 24 STER (MISCELLANEOUS) ×3 IMPLANT
CUFF TOURN 30 STER DUAL PORT (MISCELLANEOUS) IMPLANT
DRAPE SHEET LG 3/4 BI-LAMINATE (DRAPES) ×3 IMPLANT
DRSG DERMACEA 8X12 NADH (GAUZE/BANDAGES/DRESSINGS) IMPLANT
DRSG OPSITE POSTOP 4X14 (GAUZE/BANDAGES/DRESSINGS) ×3 IMPLANT
DRSG TEGADERM 4X4.75 (GAUZE/BANDAGES/DRESSINGS) ×3 IMPLANT
DURAPREP 26ML APPLICATOR (WOUND CARE) ×6 IMPLANT
ELECT CAUTERY BLADE 6.4 (BLADE) ×3 IMPLANT
ELECT REM PT RETURN 9FT ADLT (ELECTROSURGICAL) ×3
ELECTRODE REM PT RTRN 9FT ADLT (ELECTROSURGICAL) ×1 IMPLANT
EVACUATOR 1/8 PVC DRAIN (DRAIN) ×3 IMPLANT
EX-PIN ORTHOLOCK NAV 4X150 (PIN) ×6 IMPLANT
GLOVE BIO SURGEON STRL SZ7 (GLOVE) ×3 IMPLANT
GLOVE BIO SURGEON STRL SZ7.5 (GLOVE) ×3 IMPLANT
GLOVE BIOGEL M STRL SZ7.5 (GLOVE) ×6 IMPLANT
GLOVE BIOGEL PI IND STRL 7.5 (GLOVE) ×1 IMPLANT
GLOVE BIOGEL PI IND STRL 9 (GLOVE) ×1 IMPLANT
GLOVE BIOGEL PI INDICATOR 7.5 (GLOVE) ×2
GLOVE BIOGEL PI INDICATOR 9 (GLOVE) ×2
GLOVE INDICATOR 7.5 STRL GRN (GLOVE) ×3 IMPLANT
GLOVE INDICATOR 8.0 STRL GRN (GLOVE) ×3 IMPLANT
GLOVE SURG SYN 9.0  PF PI (GLOVE) ×2
GLOVE SURG SYN 9.0 PF PI (GLOVE) ×1 IMPLANT
GOWN STRL REUS W/ TWL LRG LVL3 (GOWN DISPOSABLE) ×2 IMPLANT
GOWN STRL REUS W/ TWL XL LVL3 (GOWN DISPOSABLE) ×1 IMPLANT
GOWN STRL REUS W/TWL 2XL LVL3 (GOWN DISPOSABLE) ×3 IMPLANT
GOWN STRL REUS W/TWL LRG LVL3 (GOWN DISPOSABLE) ×4
GOWN STRL REUS W/TWL XL LVL3 (GOWN DISPOSABLE) ×2
HOLDER FOLEY CATH W/STRAP (MISCELLANEOUS) ×3 IMPLANT
HOOD PEEL AWAY FLYTE STAYCOOL (MISCELLANEOUS) ×6 IMPLANT
KIT RM TURNOVER STRD PROC AR (KITS) ×3 IMPLANT
KNIFE SCULPS 14X20 (INSTRUMENTS) ×3 IMPLANT
LABEL OR SOLS (LABEL) ×3 IMPLANT
NDL SAFETY 18GX1.5 (NEEDLE) ×3 IMPLANT
NEEDLE SPNL 20GX3.5 QUINCKE YW (NEEDLE) ×3 IMPLANT
NS IRRIG 500ML POUR BTL (IV SOLUTION) ×3 IMPLANT
PACK TOTAL KNEE (MISCELLANEOUS) ×3 IMPLANT
PAD WRAPON POLAR KNEE (MISCELLANEOUS) ×1 IMPLANT
PIN DRILL QUICK PACK ×3 IMPLANT
PIN FIXATION 1/8DIA X 3INL (PIN) ×3 IMPLANT
PULSAVAC PLUS IRRIG FAN TIP (DISPOSABLE) ×3
SOL .9 NS 3000ML IRR  AL (IV SOLUTION) ×2
SOL .9 NS 3000ML IRR UROMATIC (IV SOLUTION) ×1 IMPLANT
SOL PREP PVP 2OZ (MISCELLANEOUS) ×3
SOLUTION PREP PVP 2OZ (MISCELLANEOUS) ×1 IMPLANT
SPONGE DRAIN TRACH 4X4 STRL 2S (GAUZE/BANDAGES/DRESSINGS) ×3 IMPLANT
STAPLER SKIN PROX 35W (STAPLE) ×3 IMPLANT
STRAP TIBIA SHORT (MISCELLANEOUS) ×3 IMPLANT
SUCTION FRAZIER HANDLE 10FR (MISCELLANEOUS) ×2
SUCTION TUBE FRAZIER 10FR DISP (MISCELLANEOUS) ×1 IMPLANT
SUT VIC AB 0 CT1 36 (SUTURE) ×3 IMPLANT
SUT VIC AB 1 CT1 36 (SUTURE) ×6 IMPLANT
SUT VIC AB 2-0 CT2 27 (SUTURE) ×3 IMPLANT
SYR 20CC LL (SYRINGE) ×3 IMPLANT
SYR 30ML LL (SYRINGE) ×6 IMPLANT
TIP FAN IRRIG PULSAVAC PLUS (DISPOSABLE) ×1 IMPLANT
TOWEL OR 17X26 4PK STRL BLUE (TOWEL DISPOSABLE) ×3 IMPLANT
TOWER CARTRIDGE SMART MIX (DISPOSABLE) ×3 IMPLANT
WRAPON POLAR PAD KNEE (MISCELLANEOUS) ×3

## 2017-02-12 NOTE — Anesthesia Preprocedure Evaluation (Signed)
Anesthesia Evaluation  Patient identified by MRN, date of birth, ID band Patient awake    Reviewed: Allergy & Precautions, H&P , NPO status , Patient's Chart, lab work & pertinent test results, reviewed documented beta blocker date and time   History of Anesthesia Complications Negative for: history of anesthetic complications  Airway Mallampati: II  TM Distance: >3 FB Neck ROM: full    Dental  (+) Teeth Intact, Dental Advidsory Given, Chipped   Pulmonary neg pulmonary ROS,           Cardiovascular Exercise Tolerance: Good hypertension, (-) angina+CHF  (-) CAD, (-) Past MI, (-) Cardiac Stents and (-) CABG + dysrhythmias Atrial Fibrillation + Valvular Problems/Murmurs      Neuro/Psych negative neurological ROS  negative psych ROS   GI/Hepatic Neg liver ROS, hiatal hernia, GERD  ,  Endo/Other  diabetes (borderline), Well Controlled  Renal/GU CRFRenal disease  negative genitourinary   Musculoskeletal   Abdominal   Peds  Hematology negative hematology ROS (+)   Anesthesia Other Findings Past Medical History: No date: Anemia No date: Aortic valve regurgitation No date: Arthritis No date: Atrial fibrillation ([place]) 12/2016: Cancer ([place])     Comment:  Basal Cell Skin Cancer No date: Cardiomyopathy ([place]) No date: CHF (congestive heart failure) (HCC) No date: CKD (chronic kidney disease) No date: Diverticulosis No date: Dysrhythmia No date: GERD (gastroesophageal reflux disease) No date: GI bleed No date: Heart murmur No date: History of hiatal hernia No date: Hypercholesterolemia No date: Hyperlipidemia No date: Hypertension No date: Insomnia No date: Osteopenia No date: Pre-diabetes No date: Urinary, incontinence, stress female No date: Vertigo   Reproductive/Obstetrics negative OB ROS                             Anesthesia Physical Anesthesia Plan  ASA: III  Anesthesia Plan:  Spinal   Post-op Pain Management:    Induction: Intravenous  PONV Risk Score and Plan: 2 and Ondansetron and Dexamethasone  Airway Management Planned: Natural Airway and Simple Face Mask  Additional Equipment:   Intra-op Plan:   Post-operative Plan:   Informed Consent: I have reviewed the patients History and Physical, chart, labs and discussed the procedure including the risks, benefits and alternatives for the proposed anesthesia with the patient or authorized representative who has indicated his/her understanding and acceptance.   Dental Advisory Given  Plan Discussed with: Anesthesiologist, CRNA and Surgeon  Anesthesia Plan Comments:         Anesthesia Quick Evaluation

## 2017-02-12 NOTE — Anesthesia Procedure Notes (Addendum)
Spinal  Patient location during procedure: OR Start time: 02/12/2017 1:16 PM End time: 02/12/2017 1:23 PM Staffing Anesthesiologist: Martha Clan Resident/CRNA: Nelda Marseille Preanesthetic Checklist Completed: patient identified, site marked, surgical consent, pre-op evaluation, timeout performed, IV checked, risks and benefits discussed and monitors and equipment checked Spinal Block Patient position: sitting Prep: ChloraPrep Patient monitoring: heart rate, continuous pulse ox, blood pressure and cardiac monitor Approach: right paramedian Location: L4-5 Injection technique: single-shot Needle Needle type: Whitacre and Introducer  Needle gauge: 24 G Needle length: 9 cm Assessment Sensory level: T10 Additional Notes Negative paresthesia. Negative blood return. Positive free-flowing CSF. Expiration date of kit checked and confirmed. Patient tolerated procedure well, without complications.

## 2017-02-12 NOTE — Op Note (Signed)
OPERATIVE NOTE  DATE OF SURGERY:  02/12/2017  PATIENT NAME:  Natalie Gardner   DOB: [birth year]  MRN: [id number]  PRE-OPERATIVE DIAGNOSIS: Degenerative arthrosis of the left knee, primary  POST-OPERATIVE DIAGNOSIS:  Same  PROCEDURE:  Left total knee arthroplasty using computer-assisted navigation  SURGEON:  Marciano Sequin. M.D.  ASSISTANT:  Vance Peper, PA (present and scrubbed throughout the case, critical for assistance with exposure, retraction, instrumentation, and closure)  ANESTHESIA: spinal  ESTIMATED BLOOD LOSS: 50 mL  FLUIDS REPLACED: 1100 mL of crystalloid  TOURNIQUET TIME: 89 minutes  DRAINS: 2 medium Hemovac drains  SOFT TISSUE RELEASES: Anterior cruciate ligament, posterior cruciate ligament, deep medial collateral ligament, patellofemoral ligament  IMPLANTS UTILIZED: DePuy Attune size 5N posterior stabilized femoral component (cemented), size 4 rotating platform tibial component (cemented), 35 mm medialized dome patella (cemented), and a 5 mm stabilized rotating platform polyethylene insert.  INDICATIONS FOR SURGERY: Natalie Gardner is a 71 y.o. year old female with a long history of progressive knee pain. X-rays demonstrated severe degenerative changes in tricompartmental fashion. The patient had not seen any significant improvement despite conservative nonsurgical intervention. After discussion of the risks and benefits of surgical intervention, the patient expressed understanding of the risks benefits and agree with plans for total knee arthroplasty.   The risks, benefits, and alternatives were discussed at length including but not limited to the risks of infection, bleeding, nerve injury, stiffness, blood clots, the need for revision surgery, cardiopulmonary complications, among others, and they were willing to proceed.  PROCEDURE IN DETAIL: The patient was brought into the operating room and, after adequate spinal anesthesia was achieved, a tourniquet was placed  on the patient's upper thigh. The patient's knee and leg were cleaned and prepped with alcohol and DuraPrep and draped in the usual sterile fashion. A "timeout" was performed as per usual protocol. The lower extremity was exsanguinated using an Esmarch, and the tourniquet was inflated to 300 mmHg. An anterior longitudinal incision was made followed by a standard mid vastus approach. The deep fibers of the medial collateral ligament were elevated in a subperiosteal fashion off of the medial flare of the tibia so as to maintain a continuous soft tissue sleeve. The patella was subluxed laterally and the patellofemoral ligament was incised. Inspection of the knee demonstrated severe degenerative changes with full-thickness loss of articular cartilage. Osteophytes were debrided using a rongeur. Anterior and posterior cruciate ligaments were excised. Two 4.0 mm Schanz pins were inserted in the femur and into the tibia for attachment of the array of trackers used for computer-assisted navigation. Hip center was identified using a circumduction technique. Distal landmarks were mapped using the computer. The distal femur and proximal tibia were mapped using the computer. The distal femoral cutting guide was positioned using computer-assisted navigation so as to achieve a 5 distal valgus cut. The femur was sized and it was felt that a size 5N femoral component was appropriate. A size 5 femoral cutting guide was positioned and the anterior cut was performed and verified using the computer. This was followed by completion of the posterior and chamfer cuts. Femoral cutting guide for the central box was then positioned in the center box cut was performed.  Attention was then directed to the proximal tibia. Medial and lateral menisci were excised. The extramedullary tibial cutting guide was positioned using computer-assisted navigation so as to achieve a 0 varus-valgus alignment and 3 posterior slope. The cut was performed  and verified using the computer. The proximal tibia  was sized and it was felt that a size 4 tibial tray was appropriate. Tibial and femoral trials were inserted followed by insertion of a 5 mm polyethylene insert. This allowed for excellent mediolateral soft tissue balancing both in flexion and in full extension. Finally, the patella was cut and prepared so as to accommodate a 35 mm medialized dome patella. A patella trial was placed and the knee was placed through a range of motion with excellent patellar tracking appreciated. The femoral trial was removed after debridement of posterior osteophytes. The central post-hole for the tibial component was reamed followed by insertion of a keel punch. Tibial trials were then removed. Cut surfaces of bone were irrigated with copious amounts of normal saline with antibiotic solution using pulsatile lavage and then suctioned dry. Polymethylmethacrylate cement with gentamicinwas prepared in the usual fashion using a vacuum mixer. Cement was applied to the cut surface of the proximal tibia as well as along the undersurface of a size 4 rotating platform tibial component. Tibial component was positioned and impacted into place. Excess cement was removed using [employer] fast streamer. Cement was then applied to the cut surfaces of the femur as well as along the posterior flanges of the size 5N femoral component. The femoral component was positioned and impacted into place. Excess cement was removed using [employer] fast streamer. A 5 mm polyethylene trial was inserted and the knee was brought into full extension with steady axial compression applied. Finally, cement was applied to the backside of a 35 mm medialized dome patella and the patellar component was positioned and patellar clamp applied. Excess cement was removed using [employer] fast streamer. After adequate curing of the cement, the tourniquet was deflated after a total tourniquet time of 89 minutes. Hemostasis was achieved using  electrocautery. The knee was irrigated with copious amounts of normal saline with antibiotic solution using pulsatile lavage and then suctioned dry. 20 mL of 1.3% Exparel and 60 mL of 0.25% Marcaine in 40 mL of normal saline was injected along the posterior capsule, medial and lateral gutters, and along the arthrotomy site. A 5 mm stabilized rotating platform polyethylene insert was inserted and the knee was placed through a range of motion with excellent mediolateral soft tissue balancing appreciated and excellent patellar tracking noted. 2 medium drains were placed in the wound bed and brought out through separate stab incisions. The medial parapatellar portion of the incision was reapproximated using interrupted sutures of #1 Vicryl. Subcutaneous tissue was approximated in layers using first #0 Vicryl followed #2-0 Vicryl. The skin was approximated with skin staples. A sterile dressing was applied.  The patient tolerated the procedure well and was transported to the recovery room in stable condition.    James P. Holley Bouche., M.D.

## 2017-02-12 NOTE — H&P (Signed)
The patient has been re-examined, and the chart reviewed, and there have been no interval changes to the documented history and physical.    The risks, benefits, and alternatives have been discussed at length. The patient expressed understanding of the risks benefits and agreed with plans for surgical intervention.  Netasha Wehrli P. Adeli Frost, Jr. M.D.    

## 2017-02-12 NOTE — Anesthesia Post-op Follow-up Note (Signed)
Anesthesia QCDR form completed.        

## 2017-02-12 NOTE — Progress Notes (Signed)
ANTICOAGULATION CONSULT NOTE - Initial Consult  Pharmacy Consult for warfarin Indication: atrial fibrillation  Allergies  Allergen Reactions  . Penicillins     Yeast infections Has patient had a PCN reaction causing immediate rash, facial/tongue/throat swelling, SOB or lightheadedness with hypotension: No Has patient had a PCN reaction causing severe rash involving mucus membranes or skin necrosis: No Has patient had a PCN reaction that required hospitalization: No Has patient had a PCN reaction occurring within the last 10 years: No If all of the above answers are "NO", then may proceed with Cephalosporin use.   . Sulfa Antibiotics     Yeast infections   . Protonix [Pantoprazole Sodium] Rash    Patient Measurements: Height: 5\' 2"  (157.5 cm) Weight: 172 lb (78 kg) IBW/kg (Calculated) : 50.1  Labs:  Recent Labs  02/12/17 1113  APTT 28  LABPROT 15.4*  INR 1.23    Estimated Creatinine Clearance: 42.7 mL/min (A) (by C-G formula based on SCr of 1.17 mg/dL (H)).  Assessment: Pharmacy consulted to dose and monitor warfarin in this 71 year old female who was taking warfarin PTA for atrial fibrillation.   Per med rec, home dose alternates between 5 mg and 7.5 mg PO every other day. INR = 1.23 on admission (but most likely held for surgery)  Goal of Therapy:  INR 2-3 Monitor platelets by anticoagulation protocol: Yes   Plan:  Will resume home dose of warfarin. INR to be checked with AM labs tomorrow morning.  Lenis Noon, PharmD, BCPS Clinical Pharmacist 02/12/2017,6:34 PM

## 2017-02-12 NOTE — Transfer of Care (Signed)
Immediate Anesthesia Transfer of Care Note  Patient: Natalie Gardner  Procedure(s) Performed: Procedure(s): COMPUTER ASSISTED TOTAL KNEE ARTHROPLASTY (Left)  Patient Location: PACU  Anesthesia Type:Spinal  Level of Consciousness: awake, alert  and oriented  Airway & Oxygen Therapy: Patient Spontanous Breathing  Post-op Assessment: Report given to RN and Post -op Vital signs reviewed and stable  Post vital signs: Reviewed and stable  Last Vitals:  Vitals:   02/12/17 1100  BP: (!) 117/98  Pulse: 75  Resp: 18  Temp: 36.5 C  SpO2: 99%    Last Pain:  Vitals:   02/12/17 1100  TempSrc: Oral         Complications: No apparent anesthesia complications

## 2017-02-12 NOTE — Progress Notes (Signed)
PT Cancellation Note  Patient Details Name: Natalie Gardner MRN: [id number] DOB: May 07, 1946   Cancelled Treatment:    Reason Eval/Treat Not Completed: Other (comment).  Received PT order but pt not yet on floor.  Will continue to look for pt's arrival.  Thank you for this order.    Collie Siad PT, DPT 02/12/2017, 3:05 PM

## 2017-02-12 NOTE — Anesthesia Procedure Notes (Signed)
Date/Time: 02/12/2017 1:52 PM Performed by: Nelda Marseille Pre-anesthesia Checklist: Patient identified, Emergency Drugs available, Suction available, Patient being monitored and Timeout performed Oxygen Delivery Method: Simple face mask

## 2017-02-13 ENCOUNTER — Encounter: Payer: Self-pay | Admitting: Orthopedic Surgery

## 2017-02-13 LAB — BASIC METABOLIC PANEL
Anion gap: 5 (ref 5–15)
BUN: 18 mg/dL (ref 6–20)
CALCIUM: 8 mg/dL — AB (ref 8.9–10.3)
CO2: 24 mmol/L (ref 22–32)
CREATININE: 0.87 mg/dL (ref 0.44–1.00)
Chloride: 106 mmol/L (ref 101–111)
GFR calc Af Amer: >60 mL/min (ref >60)
Glucose, Bld: 133 mg/dL — ABNORMAL HIGH (ref 65–99)
Potassium: 4.1 mmol/L (ref 3.5–5.1)
SODIUM: 135 mmol/L (ref 135–145)

## 2017-02-13 LAB — CBC
HCT: 29.5 % — ABNORMAL LOW (ref 35.0–47.0)
Hemoglobin: 9.8 g/dL — ABNORMAL LOW (ref 12.0–16.0)
MCH: 28.2 pg (ref 26.0–34.0)
MCHC: 33.3 g/dL (ref 32.0–36.0)
MCV: 84.7 fL (ref 80.0–100.0)
PLATELETS: 215 10*3/uL (ref 150–440)
RBC: 3.48 MIL/uL — ABNORMAL LOW (ref 3.80–5.20)
RDW: 15.7 % — AB (ref 11.5–14.5)
WBC: 8.8 10*3/uL (ref 3.6–11.0)

## 2017-02-13 LAB — PROTIME-INR
INR: 1.27
Prothrombin Time: 15.8 seconds — ABNORMAL HIGH (ref 11.4–15.2)

## 2017-02-13 MED ORDER — TRAMADOL HCL 50 MG PO TABS: 50.0000 mg | ORAL_TABLET | ORAL | 0 refills | Status: DC | PRN

## 2017-02-13 MED ORDER — OXYCODONE HCL 5 MG PO TABS
5.0000 mg | ORAL_TABLET | ORAL | 0 refills | Status: DC | PRN
Start: 2017-02-13 — End: 2017-04-10

## 2017-02-13 NOTE — Progress Notes (Signed)
ORTHOPAEDICS PROGRESS NOTE  PATIENT NAME: Natalie Gardner DOB: [birth year]  MRN: [id number]  POD # 1: Left total knee arthroplasty  Subjective: Patient is doing extremely well this morning. She rested well last night. Pain is well-controlled.  Objective: Vital signs in last 24 hours: Temp:  [97 F (36.1 C)-97.9 F (36.6 C)] 97.7 F (36.5 C) (09/18 0728) Pulse Rate:  [61-109] 85 (09/18 0728) Resp:  [13-20] 16 (09/18 0228) BP: (87-117)/(42-98) 105/54 (09/18 0728) SpO2:  [96 %-100 %] 98 % (09/18 0728) Weight:  [78 kg (172 lb)] 78 kg (172 lb) (09/17 1100)  Intake/Output from previous day: 09/17 0701 - 09/18 0700 In: 2876.7 [P.O.:120; I.V.:2231.7; IV Piggyback:525] Out: 6269 [Urine:1220; Drains:105; Blood:50]   Recent Labs  02/12/17 1113 02/13/17 0524  WBC  --  8.8  HGB  --  9.8*  HCT  --  29.5*  PLT  --  215  K  --  4.1  CL  --  106  CO2  --  24  BUN  --  18  CREATININE  --  0.87  GLUCOSE  --  133*  CALCIUM  --  8.0*  INR 1.23 1.27    EXAM General: Well-developed well-nourished female seen in no apparent discomfort. Lungs: clear to auscultation Cardiac: Irregular rate and rhythm. No appreciable murmurs, gallops, or rubs. Left lower extremity: Dressing is dry and intact. Hemovac drain is intact. Bone foam is in place with the knee in full extension. Homans test is negative. The patient is able to perform straight leg raise with minimal assistance. Neurologic: Awake, alert, and oriented. Sensory and motor function are intact.  Assessment: Left total knee arthroplasty  Secondary diagnoses: Osteopenia Hypertension Hyperlipidemia Gastroesophageal reflux disease Diverticulosis Chronic kidney disease History of congestive heart failure Cardiomyopathy Atrial fibrillation Aortic valve regurgitation  Plan: Anticoagulation with Coumadin as per pharmacy. Today's goal were reviewed with the patient.  Begin physical therapy and occupational therapy as per total  knee arthroplasty rehabilitation protocol. Plan is to go Home after hospital stay. DVT Prophylaxis - Coumadin, Foot Pumps and TED hose  Meliana Canner P. Holley Bouche M.D.

## 2017-02-13 NOTE — Discharge Summary (Signed)
Physician Discharge Summary  Patient ID: Natalie Gardner MRN: [id number] DOB/AGE: 72-Apr-1947 72 y.o.  Admit date: 02/12/2017 Discharge date: 02/14/2017  Admission Diagnoses:  Primary Osteoarthritis of Left Knee   Discharge Diagnoses: Patient Active Problem List   Diagnosis Date Noted  . 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 Advanced care planning/counseling discussion 09/28/2016  . Essential hypertension 09/13/2015  . CHF (congestive heart failure) ([place]) 09/13/2015  . Atrial fibrillation ([place]) 09/13/2015  . Hyperlipidemia 09/13/2015  . Diabetes mellitus without complication ([place]) 73/53/2992    Past Medical History:  Diagnosis Date  . Anemia   . Aortic valve regurgitation   . Arthritis   . Atrial fibrillation ([place])   . Cancer ([place]) 12/2016   Basal Cell Skin Cancer  . Cardiomyopathy ([place])   . CHF (congestive heart failure) ([place])   . CKD (chronic kidney disease)   . Diverticulosis   . Dysrhythmia   . GERD (gastroesophageal reflux disease)   . GI bleed   . Heart murmur   . History of hiatal hernia   . Hypercholesterolemia   . Hyperlipidemia   . Hypertension   . Insomnia   . Osteopenia   . Pre-diabetes   . Urinary, incontinence, stress female   . Vertigo      Transfusion: No transfusions during this admission   Consultants (if any):   Discharged Condition: Improved  Hospital Course: Natalie Gardner is an 71 y.o. female who was admitted 02/12/2017 with a diagnosis of degenerative arthrosis left knee and went to the operating room on 02/12/2017 and underwent the above named procedures.    Surgeries:Procedure(s): COMPUTER ASSISTED TOTAL KNEE ARTHROPLASTY on 02/12/2017  PRE-OPERATIVE DIAGNOSIS: Degenerative arthrosis of the left knee, primary  POST-OPERATIVE DIAGNOSIS:  Same  PROCEDURE:  Left total knee arthroplasty using computer-assisted navigation  SURGEON:  Marciano Sequin. M.D.  ASSISTANT:  Vance Peper, PA (present  and scrubbed throughout the case, critical for assistance with exposure, retraction, instrumentation, and closure)  ANESTHESIA: spinal  ESTIMATED BLOOD LOSS: 50 mL  FLUIDS REPLACED: 1100 mL of crystalloid  TOURNIQUET TIME: 89 minutes  DRAINS: 2 medium Hemovac drains  SOFT TISSUE RELEASES: Anterior cruciate ligament, posterior cruciate ligament, deep medial collateral ligament, patellofemoral ligament  IMPLANTS UTILIZED: DePuy Attune size 5N posterior stabilized femoral component (cemented), size 4 rotating platform tibial component (cemented), 35 mm medialized dome patella (cemented), and a 5 mm stabilized rotating platform polyethylene insert.  INDICATIONS FOR SURGERY: Natalie Gardner is a 71 y.o. year old female with a long history of progressive knee pain. X-rays demonstrated severe degenerative changes in tricompartmental fashion. The patient had not seen any significant improvement despite conservative nonsurgical intervention. After discussion of the risks and benefits of surgical intervention, the patient expressed understanding of the risks benefits and agree with plans for total knee arthroplasty.   The risks, benefits, and alternatives were discussed at length including but not limited to the risks of infection, bleeding, nerve injury, stiffness, blood clots, the need for revision surgery, cardiopulmonary complications, among others, and they were willing to proceed.  Patient tolerated the surgery well. No complications .Patient was taken to PACU where she was stabilized and then transferred to the orthopedic floor.  Patient started on Coumadin. Monitored and adjusted by pharmacy. Foot pumps applied bilaterally at 80 mm hgb. Heels elevated off bed with rolled towels. No evidence of DVT. Calves non tender. Negative Homan. Physical therapy started on day #1 for gait training and transfer  with OT starting on  day #1 for ADL and assisted devices. Patient has done well with  therapy. Ambulated 200 feet upon being discharged. Able to ascend and descend 4 steps safely and independently  Patient's IV And Foley were discontinued on day #1 with Hemovac being discontinued on day #2. Dressing was changed on day 2 prior to patient being discharged   She was given perioperative antibiotics:  Anti-infectives    Start     Dose/Rate Route Frequency Ordered Stop   02/12/17 1129  clindamycin (CLEOCIN) 900 MG/50ML IVPB    Comments:  Phineas Real   : cabinet override      02/12/17 1129 02/12/17 2344   02/12/17 1100  clindamycin (CLEOCIN) IVPB 600 mg     600 mg 100 mL/hr over 30 Minutes Intravenous Every 6 hours 02/12/17 1049 02/13/17 0759   02/12/17 0600  clindamycin (CLEOCIN) IVPB 900 mg  Status:  Discontinued     900 mg 100 mL/hr over 30 Minutes Intravenous On call to O.R. 02/11/17 2152 02/12/17 1050    .  She was fitted with AV 1 compression foot pump devices, instructed on heel pumps, early ambulation, and fitted with TED stockings bilaterally for DVT prophylaxis.  She benefited maximally from the hospital stay and there were no complications.    Recent vital signs:  Vitals:   02/13/17 0228 02/13/17 0728  BP: (!) 105/53 (!) 105/54  Pulse: 94 85  Resp: 16   Temp: 97.9 F (36.6 C) 97.7 F (36.5 C)  SpO2: 98% 98%    Recent laboratory studies:  Lab Results  Component Value Date   HGB 9.8 (L) 02/13/2017   HGB 11.9 (L) 01/31/2017   HGB 11.2 09/28/2016   Lab Results  Component Value Date   WBC 8.8 02/13/2017   PLT 215 02/13/2017   Lab Results  Component Value Date   INR 1.27 02/13/2017   Lab Results  Component Value Date   NA 135 02/13/2017   K 4.1 02/13/2017   CL 106 02/13/2017   CO2 24 02/13/2017   BUN 18 02/13/2017   CREATININE 0.87 02/13/2017   GLUCOSE 133 (H) 02/13/2017    Discharge Medications:   Allergies as of 02/13/2017      Reactions   Penicillins    Yeast infections Has patient had a PCN reaction causing immediate rash,  facial/tongue/throat swelling, SOB or lightheadedness with hypotension: No Has patient had a PCN reaction causing severe rash involving mucus membranes or skin necrosis: No Has patient had a PCN reaction that required hospitalization: No Has patient had a PCN reaction occurring within the last 10 years: No If all of the above answers are "NO", then may proceed with Cephalosporin use.   Sulfa Antibiotics    Yeast infections    Protonix [pantoprazole Sodium] Rash      Medication List    TAKE these medications   acetaminophen 500 MG tablet Commonly known as:  TYLENOL Take 1,000 mg by mouth every morning.   acetaminophen 650 MG CR tablet Commonly known as:  TYLENOL Take 1,300 mg by mouth at bedtime.   atorvastatin 10 MG tablet Commonly known as:  LIPITOR TAKE 1 TABLET BY MOUTH AT BEDTIME FOR CHOLESTEROL   diltiazem 180 MG 24 hr capsule Commonly known as:  DILACOR XR Take 180 mg by mouth daily.   diphenhydrAMINE 25 MG tablet Commonly known as:  BENADRYL Take 25 mg by mouth 2 (two) times daily as needed for allergies.   docusate sodium 100  MG capsule Commonly known as:  COLACE Take 2 capsules (200 mg total) by mouth 2 (two) times daily. What changed:  how much to take  when to take this  reasons to take this   furosemide 20 MG tablet Commonly known as:  LASIX Take 20 mg by mouth daily as needed for edema.   hydrochlorothiazide 25 MG tablet Commonly known as:  HYDRODIURIL Take 1 tablet (25 mg total) by mouth daily.   lisinopril 40 MG tablet Commonly known as:  PRINIVIL,ZESTRIL Take 1 tablet (40 mg total) by mouth daily.   metoprolol succinate 50 MG 24 hr tablet Commonly known as:  TOPROL-XL Take 50 mg by mouth every evening.   MULTI-VITAMINS Tabs Take 1 tablet by mouth daily.   Omega-3 1000 MG Caps Take 1,000 mg by mouth daily.   oxyCODONE 5 MG immediate release tablet Commonly known as:  Oxy IR/ROXICODONE Take 1-2 tablets (5-10 mg total) by mouth every 4  (four) hours as needed for severe pain or breakthrough pain.   ranitidine 150 MG tablet Commonly known as:  ZANTAC Take 150 mg by mouth daily.   spironolactone 25 MG tablet Commonly known as:  ALDACTONE Take 25 mg by mouth every evening.   sucralfate 1 g tablet Commonly known as:  CARAFATE Take 1 g by mouth 2 (two) times daily.   traMADol 50 MG tablet Commonly known as:  ULTRAM Take 1-2 tablets (50-100 mg total) by mouth every 4 (four) hours as needed for moderate pain.   warfarin 5 MG tablet Commonly known as:  COUMADIN Take 5 mg by mouth daily.   warfarin 2.5 MG tablet Commonly known as:  COUMADIN Take 2.5 mg every other day with the 5 mg tablet            Durable Medical Equipment        Start     Ordered   02/12/17 1050  DME Walker rolling  Once    Question:  Patient needs a walker to treat with the following condition  Answer:  Total knee replacement status   02/12/17 1049   02/12/17 1050  DME Bedside commode  Once    Question:  Patient needs a bedside commode to treat with the following condition  Answer:  Total knee replacement status   02/12/17 1049       Discharge Care Instructions        Start     Ordered   02/13/17 0000  oxyCODONE (OXY IR/ROXICODONE) 5 MG immediate release tablet  Every 4 hours PRN     02/13/17 0812   02/13/17 0000  traMADol (ULTRAM) 50 MG tablet  Every 4 hours PRN     02/13/17 0812   02/13/17 0000  Increase activity slowly     02/13/17 0812   02/13/17 0000  Diet - low sodium heart healthy     02/13/17 0932      Diagnostic Studies: Dg Knee Left Port  Result Date: 02/12/2017 CLINICAL DATA:  Total knee replacement post op EXAM: PORTABLE LEFT KNEE - 1-2 VIEW COMPARISON:  None. FINDINGS: Total knee arthroplasty hardware appears appropriately positioned. Osseous alignment is anatomic. Expected postsurgical changes within the overlying soft tissues. IMPRESSION: Status post total knee arthroplasty. Hardware appears intact and  appropriately positioned. No evidence of surgical complicating feature. Electronically Signed   By: Franki Cabot M.D.   On: 02/12/2017 17:14    Disposition: 01-Home or Self Care  Discharge Instructions    Diet - low sodium heart  healthy    Complete by:  As directed    Increase activity slowly    Complete by:  As directed       Follow-up Information    Watt Climes, PA On 02/27/2017.   Specialty:  Physician Assistant Why:  at 1:45pm Contact information: [place] [address] [phone]        Dereck Leep, MD On 03/27/2017.   Specialty:  Orthopedic Surgery Why:  at 10:45am Contact information: [place] [address] [phone]            Signed: Watt Climes 02/13/2017, 8:12 AM

## 2017-02-13 NOTE — Evaluation (Signed)
Physical Therapy Evaluation Patient Details Name: Natalie Gardner MRN: [id number] DOB: December 06, 1945 Today's Date: 02/13/2017   History of Present Illness  Pt. is a 71 y.o. female who was admitted to [facility]  for a Left TKR. Pt. PMHX includes: Osteopenia, HTN, Hyperlipidemia, GERD, Diverticulosis, CKD, CHF, Cardiomyopathy, A-Fib., Aortic Valve Regurgitation, DM.  Clinical Impression  Pt admitted with above diagnosis. Pt currently with functional limitations due to the deficits listed below (see PT Problem List). Pt is supervision only for bed mobility and CGA for transfers and ambulation. She is able to ambulate almost to RN station and back to recliner with therapist. Cues for sequencing with walker and daugther assists with IV pole. Speed is decreased but functional for limited household mobility. She is able to complete all supine exercises with good LLE strength demonstrated. Able to perform SLR and SAQ without assistance. AAROM is 0-89 degrees and primarily limited by pain in flexion. She will be appropriate to return home with husband and [facility] at discharge. She will need a rolling walker. Pt will benefit from PT services to address deficits in strength, balance, and mobility in order to return to full function at home.     Follow Up Recommendations Home health PT    Equipment Recommendations  Rolling walker with 5" wheels    Recommendations for Other Services       Precautions / Restrictions Precautions Precautions: Knee Precaution Booklet Issued: Yes (comment) Required Braces or Orthoses: Knee Immobilizer - Left Knee Immobilizer - Left: Discontinue once straight leg raise with < 10 degree lag Restrictions Weight Bearing Restrictions: Yes LLE Weight Bearing: Weight bearing as tolerated      Mobility  Bed Mobility Overal bed mobility: Needs Assistance Bed Mobility: Supine to Sit     Supine to sit: Supervision     General bed mobility comments: Cues for sequencing. HOB elevated  and use of bed rails but no external assistance provided by therapist  Transfers Overall transfer level: Needs assistance Equipment used: Rolling walker (2 wheeled) Transfers: Sit to/from Stand Sit to Stand: Min guard         General transfer comment: Pt requiring increased time to come to standing with cues for safe hand placement. Once upright pt complains of mild dizziness. Does not resolve after standing marches. Pt returned to sitting and orthostatic vitals obtained from sit to stand which are negative. After seated rest break pt denies dizziness following next transfer  Ambulation/Gait Ambulation/Gait assistance: Min guard Ambulation Distance (Feet): 100 Feet Assistive device: Rolling walker (2 wheeled) Gait Pattern/deviations: Decreased step length - right;Decreased stance time - left;Decreased weight shift to left   Gait velocity interpretation: <1.8 ft/sec, indicative of risk for recurrent falls General Gait Details: Pt able to ambulate almost to RN station and back to recliner with therapist. Cues for sequencing with walker and daugther assists with IV pole. Speed is decreased but functional for limited household mobility  Stairs            Wheelchair Mobility    Modified Rankin (Stroke Patients Only)       Balance Overall balance assessment: Needs assistance Sitting-balance support: No upper extremity supported Sitting balance-Leahy Scale: Good     Standing balance support: Bilateral upper extremity supported Standing balance-Leahy Scale: Fair Standing balance comment: Able to maintain in standing with bilateral UE support on walker  Pertinent Vitals/Pain Pain Assessment: No/denies pain Pain Location: Denies L knee pain upon arrival and with ambulation    Home Living Family/patient expects to be discharged to:: Private residence Living Arrangements: Spouse/significant other Available Help at Discharge:  Family Type of Home: House Home Access: Stairs to enter Entrance Stairs-Rails: None Entrance Stairs-Number of Steps: 2 Home Layout: One level Home Equipment: Cane - single point (no walker)      Prior Function Level of Independence: Independent         Comments: Independent with ambulation without assistive device. Independent with ADLs/IADLs. No falls     Hand Dominance   Dominant Hand: Left    Extremity/Trunk Assessment   Upper Extremity Assessment Upper Extremity Assessment: Overall WFL for tasks assessed    Lower Extremity Assessment Lower Extremity Assessment: LLE deficits/detail LLE Deficits / Details: Intact sensation to light touch around foot/ankle. Full DF/PF. Pt able to perform SLR and SAQ without assistance. RLE strength appears grossly WFL       Communication   Communication: No difficulties  Cognition Arousal/Alertness: Awake/alert Behavior During Therapy: WFL for tasks assessed/performed Overall Cognitive Status: Within Functional Limits for tasks assessed                                        General Comments      Exercises Total Joint Exercises Ankle Circles/Pumps: AROM;Both;10 reps;Supine Quad Sets: Strengthening;Both;10 reps;Supine Gluteal Sets: Strengthening;Both;10 reps;Supine Towel Squeeze: Strengthening;Both;10 reps;Supine Short Arc Quad: Strengthening;Left;10 reps;Supine Heel Slides: Strengthening;Left;10 reps;Supine Hip ABduction/ADduction: Strengthening;Left;10 reps;Supine Straight Leg Raises: Strengthening;Left;10 reps;Supine Goniometric ROM: 0-89 degrees AAROM, pain limited for flexion   Assessment/Plan    PT Assessment Patient needs continued PT services  PT Problem List Decreased strength;Decreased activity tolerance;Decreased range of motion;Decreased balance;Decreased mobility;Decreased knowledge of use of DME;Pain       PT Treatment Interventions DME instruction;Gait training;Stair training;Therapeutic  activities;Therapeutic exercise;Balance training;Neuromuscular re-education;Patient/family education;Manual techniques    PT Goals (Current goals can be found in the Care Plan section)  Acute Rehab PT Goals Patient Stated Goal: To return home PT Goal Formulation: With patient/family Time For Goal Achievement: 02/27/17 Potential to Achieve Goals: Good    Frequency BID   Barriers to discharge        Co-evaluation               AM-PAC PT "6 Clicks" Daily Activity  Outcome Measure Difficulty turning over in bed (including adjusting bedclothes, sheets and blankets)?: A Little Difficulty moving from lying on back to sitting on the side of the bed? : A Lot Difficulty sitting down on and standing up from a chair with arms (e.g., wheelchair, bedside commode, etc,.)?: A Little Help needed moving to and from a bed to chair (including a wheelchair)?: A Little Help needed walking in hospital room?: A Little Help needed climbing 3-5 steps with a railing? : A Lot 6 Click Score: 16    End of Session Equipment Utilized During Treatment: Gait belt Activity Tolerance: Patient tolerated treatment well Patient left: in chair;with call bell/phone within reach;with chair alarm set;Other (comment) (polar care in place, RN to reapply AV1 boots after meds) Nurse Communication: Mobility status PT Visit Diagnosis: Unsteadiness on feet (R26.81);Muscle weakness (generalized) (M62.81);Pain Pain - Right/Left: Left Pain - part of body: Knee    Time: 9211-9417 PT Time Calculation (min) (ACUTE ONLY): 36 min   Charges:   PT Evaluation $PT Eval Low  Complexity: 1 Low PT Treatments $Therapeutic Exercise: 8-22 mins   PT G Codes:   PT G-Codes **NOT FOR INPATIENT CLASS** Functional Assessment Tool Used: AM-PAC 6 Clicks Basic Mobility Functional Limitation: Mobility: Walking and moving around Mobility: Walking and Moving Around Current Status (A1287): At least 40 percent but less than 60 percent  impaired, limited or restricted Mobility: Walking and Moving Around Goal Status [phone]): At least 1 percent but less than 20 percent impaired, limited or restricted    Phillips Grout PT, DPT   Havilah Topor 02/13/2017, 10:27 AM

## 2017-02-13 NOTE — Anesthesia Postprocedure Evaluation (Signed)
Anesthesia Post Note  Patient: Natalie Gardner  Procedure(s) Performed: Procedure(s) (LRB): COMPUTER ASSISTED TOTAL KNEE ARTHROPLASTY (Left)  Patient location during evaluation: Nursing Unit Anesthesia Type: Spinal Level of consciousness: awake and alert Pain management: pain level controlled Vital Signs Assessment: post-procedure vital signs reviewed and stable Respiratory status: spontaneous breathing and nonlabored ventilation Cardiovascular status: blood pressure returned to baseline Postop Assessment: no headache Anesthetic complications: no Comments: Moves both feet and legs.  Denies pain.  Has not voided yet. Foley discontinued short time ago.  Has not been up yet.     Last Vitals:  Vitals:   02/13/17 0228 02/13/17 0728  BP: (!) 105/53 (!) 105/54  Pulse: 94 85  Resp: 16   Temp: 36.6 C 36.5 C  SpO2: 98% 98%    Last Pain:  Vitals:   02/13/17 0728  TempSrc: Oral  PainSc:                  Buckner [place]

## 2017-02-13 NOTE — Progress Notes (Signed)
Physical Therapy Treatment Patient Details Name: Natalie Gardner MRN: [id number] DOB: 1946-03-26 Today's Date: 02/13/2017    History of Present Illness Pt. is a 71 y.o. female who was admitted to [facility]  for a Left TKR. Pt. PMHX includes: Osteopenia, HTN, Hyperlipidemia, GERD, Diverticulosis, CKD, CHF, Cardiomyopathy, A-Fib., Aortic Valve Regurgitation, DM.    PT Comments    Pt making good progress with therapy during PM session. She is able to complete a full lap around RN station. Cues provided for step length and sequencing with walker. Speed gradually increases as distance progresses. HR between 110-120 bpm and SaO2 at 99%. Pt denies DOE or dizziness with ambulation. Bed mobility and transfers are improving as well with respect to speed/sequencing. She is able to complete all seated and supine exercises as instructed. Pt will benefit from PT services to address deficits in strength, balance, and mobility in order to return to full function at home.    Follow Up Recommendations  Home health PT     Equipment Recommendations  Rolling walker with 5" wheels    Recommendations for Other Services       Precautions / Restrictions Precautions Precautions: Knee Precaution Booklet Issued: Yes (comment) Required Braces or Orthoses: Knee Immobilizer - Left Knee Immobilizer - Left: Discontinue once straight leg raise with < 10 degree lag Restrictions Weight Bearing Restrictions: Yes LLE Weight Bearing: Weight bearing as tolerated    Mobility  Bed Mobility Overal bed mobility: Needs Assistance Bed Mobility: Sit to Supine     Supine to sit: Supervision     General bed mobility comments: HOB elevated, no use of bed rails to return to bed. Good L hip flexion strength. No external assist required  Transfers Overall transfer level: Needs assistance Equipment used: Rolling walker (2 wheeled) Transfers: Sit to/from Stand Sit to Stand: Min guard         General transfer comment: Pt  demonstrates improved speed/sequencing from AM session. More stable during transfers this afternoon  Ambulation/Gait Ambulation/Gait assistance: Min guard Ambulation Distance (Feet): 220 Feet Assistive device: Rolling walker (2 wheeled) Gait Pattern/deviations: Decreased step length - right;Decreased stance time - left;Decreased weight shift to left Gait velocity: Decreased Gait velocity interpretation: <1.8 ft/sec, indicative of risk for recurrent falls General Gait Details: Pt is able to complete a full lap around RN station. Cues provided for step length and sequencing with walker. Speed gradually increases as distance progresses. HR between 110-120 bpm and SaO2 at 99%. Pt denies DOE or dizziness with ambulation   Stairs            Wheelchair Mobility    Modified Rankin (Stroke Patients Only)       Balance Overall balance assessment: Needs assistance Sitting-balance support: No upper extremity supported Sitting balance-Leahy Scale: Good     Standing balance support: Bilateral upper extremity supported Standing balance-Leahy Scale: Fair Standing balance comment: Able to maintain in standing with bilateral UE support on walker                            Cognition Arousal/Alertness: Awake/alert Behavior During Therapy: WFL for tasks assessed/performed Overall Cognitive Status: Within Functional Limits for tasks assessed                                        Exercises Total Joint Exercises Ankle Circles/Pumps: Strengthening;Both;10 reps;Supine (Heel raises  x 10 as well in sitting) Quad Sets: Strengthening;Both;10 reps;Supine Gluteal Sets: Strengthening;Both;10 reps;Supine Heel Slides: Strengthening;Left;10 reps;Supine Hip ABduction/ADduction: Strengthening;Left;10 reps;Seated Straight Leg Raises: Strengthening;Left;10 reps;Supine Long Arc Quad: Strengthening;Left;10 reps;Seated Knee Flexion: Strengthening;Left;10 reps;Seated Marching in  Standing: Strengthening;Both;10 reps;Seated    General Comments        Pertinent Vitals/Pain Pain Assessment: No/denies pain Pain Location: Denies L knee pain upon arrival, with movement reports mild increase in pain but does not rate on NPRS    Home Living                      Prior Function            PT Goals (current goals can now be found in the care plan section) Acute Rehab PT Goals Patient Stated Goal: To return home PT Goal Formulation: With patient/family Time For Goal Achievement: 02/27/17 Potential to Achieve Goals: Good Progress towards PT goals: Progressing toward goals    Frequency    BID      PT Plan Current plan remains appropriate    Co-evaluation              AM-PAC PT "6 Clicks" Daily Activity  Outcome Measure  Difficulty turning over in bed (including adjusting bedclothes, sheets and blankets)?: A Little Difficulty moving from lying on back to sitting on the side of the bed? : A Lot Difficulty sitting down on and standing up from a chair with arms (e.g., wheelchair, bedside commode, etc,.)?: A Little Help needed moving to and from a bed to chair (including a wheelchair)?: A Little Help needed walking in hospital room?: A Little Help needed climbing 3-5 steps with a railing? : A Lot 6 Click Score: 16    End of Session Equipment Utilized During Treatment: Gait belt Activity Tolerance: Patient tolerated treatment well Patient left: in bed;with bed alarm set;with call bell/phone within reach;with SCD's reapplied;Other (comment) (polar care and bone foam in place)   PT Visit Diagnosis: Unsteadiness on feet (R26.81);Muscle weakness (generalized) (M62.81);Pain Pain - Right/Left: Left Pain - part of body: Knee     Time: 1413-1436 PT Time Calculation (min) (ACUTE ONLY): 23 min  Charges:  $Gait Training: 8-22 mins $Therapeutic Exercise: 8-22 mins                    G Codes:       Natalie Gardner PT, DPT      Natalie Gardner 02/13/2017, 2:55 PM

## 2017-02-13 NOTE — Plan of Care (Signed)
Problem: Activity: Goal: Ability to avoid complications of mobility impairment will improve Outcome: Progressing Pt is tolerating Zero knee well, with few complaints of pain. Pt tolerated foot dangle well with moderate assistance to sit at edge of bed.

## 2017-02-13 NOTE — Evaluation (Signed)
Occupational Therapy Evaluation Patient Details Name: Natalie Gardner MRN: [id number] DOB: [birth year] Today's Date: 02/13/2017    History of Present Illness Pt. is a 71 y.o. female who was admitted to [facility]  for a Left TKR. Pt. PMHX includes: Osteopenia, HTN, Hyperlipidemia, GERD, Diverticulosis, CKD, CHF, Cardiomyopathy, A-Fib., Aortic Valve Regurgitation, DM.   Clinical Impression   Pt. Is a 71 y.o. female who was admitted to [facility] for a Left TKR. Pt. was previously independent with ADLs, IADLs and driving. Pt. resides with her husband who works during the day. Pt. and daughter reports pt. will have support and assistance from the pt's sister, and her daughter would be able to assist as needed.  Pt. education was provided about A/E use for LE ADLs. Pt. presents with limited ROM, pain, and limited functional mobility during ADLs. Pt. could benefit from skilled OT services for ADL training, A/E training, UE there. Ex., and pt. education able home modification, and DME. Pt. plans to return home upon discharge with family assist.    Follow Up Recommendations  No OT follow up    Equipment Recommendations       Recommendations for Other Services       Precautions / Restrictions Precautions Precautions: Knee Restrictions Weight Bearing Restrictions: Yes LLE Weight Bearing: Weight bearing as tolerated                                                    ADL either performed or assessed with clinical judgement   ADL Overall ADL's : Needs assistance/impaired Eating/Feeding: Set up;Sitting   Grooming: Set up;Sitting   Upper Body Bathing: Set up   Lower Body Bathing: Moderate assistance   Upper Body Dressing : Set up   Lower Body Dressing: Moderate assistance               Functional mobility during ADLs:  (TBA) General ADL Comments: Pt. edcuation was provided about A/E use for LE ADLs.     Vision         Perception     Praxis      Pertinent  Vitals/Pain Pain Assessment: 0-10     Hand Dominance Left   Extremity/Trunk Assessment Upper Extremity Assessment Upper Extremity Assessment: Overall WFL for tasks assessed       Communication Communication Communication: No difficulties   Cognition Arousal/Alertness: Awake/alert Behavior During Therapy: WFL for tasks assessed/performed Overall Cognitive Status: Within Functional Limits for tasks assessed                                     General Comments       Exercises     Shoulder Instructions      Home Living Family/patient expects to be discharged to:: Private residence Living Arrangements: Spouse/significant other Available Help at Discharge: Family Type of Home: House Home Access: Stairs to enter Technical brewer of Steps: 2 Entrance Stairs-Rails: None Home Layout: One level     Bathroom Shower/Tub: Occupational psychologist: Handicapped height     Home Equipment: Cane - single point          Prior Functioning/Environment Level of Independence: Independent        Comments: Pt. was independent with ADLs, and IADLs. Pt. took tub side  pan baths secondary to not being able to get in and out of the old tub.          OT Problem List: Decreased strength;Decreased range of motion;Decreased activity tolerance;Decreased knowledge of use of DME or AE;Pain      OT Treatment/Interventions: Self-care/ADL training;Therapeutic exercise;DME and/or AE instruction;Therapeutic activities;Patient/family education    OT Goals(Current goals can be found in the care plan section) Acute Rehab OT Goals Patient Stated Goal: To return home OT Goal Formulation: With patient Potential to Achieve Goals: Good  OT Frequency: Min 2X/week   Barriers to D/C:            Co-evaluation              AM-PAC PT "6 Clicks" Daily Activity     Outcome Measure Help from another person eating meals?: None Help from another person taking care  of personal grooming?: None Help from another person toileting, which includes using toliet, bedpan, or urinal?: A Little Help from another person bathing (including washing, rinsing, drying)?: A Lot Help from another person to put on and taking off regular upper body clothing?: None Help from another person to put on and taking off regular lower body clothing?: A Lot 6 Click Score: 19   End of Session    Activity Tolerance: Patient tolerated treatment well Patient left: in chair;with call bell/phone within reach;with chair alarm set  OT Visit Diagnosis: Muscle weakness (generalized) (M62.81)                Time: 8299-3716 OT Time Calculation (min): 15 min Charges:  OT General Charges $OT Visit: 1 Visit OT Evaluation $OT Eval Low Complexity: 1 Low G-Codes: OT G-codes **NOT FOR INPATIENT CLASS** Functional Limitation: Self care Self Care Current Status (R6789): At least 40 percent but less than 60 percent impaired, limited or restricted Self Care Goal Status (F8101): At least 20 percent but less than 40 percent impaired, limited or restricted   Natalie Carina, MS, OTR/L   Natalie Carina, MS, OTR/L 02/13/2017, 9:44 AM

## 2017-02-13 NOTE — Care Management Note (Signed)
Case Management Note  Patient Details  Name: KEONDRIA SIEVER MRN: [id number] Date of Birth: 1945-06-19  Subjective/Objective:  POD # 1 left total knee arthroplasty. Met with patient at bedside to discuss discharge planning. She lives at home with her spouse who will be her caregiver at discharge. Offered choice of home health agencies. Referral to [facility] for home health PT. She will need a walker. Ordered that from [facility]. Pharmacy:  [facility]. Patient on coumadin chronically.                 Action/Plan: [facility] for HHPT, Walker ordered from [facility].   Expected Discharge Date:                  Expected Discharge Plan:  [place]  In-House Referral:     Discharge planning Services  CM Consult  Post Acute Care Choice:  Durable Medical Equipment, Home Health Choice offered to:  Patient  DME Arranged:  Walker rolling DME Agency:  [place]:  PT [place] Agency:  [facility] ([facility]  Status of Service:  In process, will continue to follow  If discussed at Long Length of Stay Meetings, dates discussed:    Additional Comments:  Jolly Mango, RN 02/13/2017, 2:45 PM

## 2017-02-14 DIAGNOSIS — Z23 Encounter for immunization: Secondary | ICD-10-CM | POA: Diagnosis not present

## 2017-02-14 LAB — BASIC METABOLIC PANEL
Anion gap: 6 (ref 5–15)
BUN: 15 mg/dL (ref 6–20)
CHLORIDE: 101 mmol/L (ref 101–111)
CO2: 22 mmol/L (ref 22–32)
Calcium: 8.5 mg/dL — ABNORMAL LOW (ref 8.9–10.3)
Creatinine, Ser: 0.93 mg/dL (ref 0.44–1.00)
GFR calc Af Amer: >60 mL/min (ref >60)
GFR calc non Af Amer: >60 mL/min (ref >60)
GLUCOSE: 163 mg/dL — AB (ref 65–99)
POTASSIUM: 4.6 mmol/L (ref 3.5–5.1)
Sodium: 129 mmol/L — ABNORMAL LOW (ref 135–145)

## 2017-02-14 LAB — CBC
HEMATOCRIT: 33.6 % — AB (ref 35.0–47.0)
HEMOGLOBIN: 11.4 g/dL — AB (ref 12.0–16.0)
MCH: 28.4 pg (ref 26.0–34.0)
MCHC: 34.1 g/dL (ref 32.0–36.0)
MCV: 83.3 fL (ref 80.0–100.0)
Platelets: 268 10*3/uL (ref 150–440)
RBC: 4.03 MIL/uL (ref 3.80–5.20)
RDW: 15.7 % — AB (ref 11.5–14.5)
WBC: 13.4 10*3/uL — ABNORMAL HIGH (ref 3.6–11.0)

## 2017-02-14 LAB — PROTIME-INR
INR: 1.57
Prothrombin Time: 18.6 seconds — ABNORMAL HIGH (ref 11.4–15.2)

## 2017-02-14 NOTE — Progress Notes (Signed)
Physical Therapy Treatment Patient Details Name: Natalie Gardner MRN: [id number] DOB: [birth year] Today's Date: 02/14/2017    History of Present Illness Pt. is a 71 y.o. female who was admitted to [facility]  for a Left TKR. Pt. PMHX includes: Osteopenia, HTN, Hyperlipidemia, GERD, Diverticulosis, CKD, CHF, Cardiomyopathy, A-Fib., Aortic Valve Regurgitation, DM.    PT Comments    Pt continues to demonstrate improvement with therapy. She is again able to complete a full lap around RN station with therapist. Cues for symmetrical step length, increase push off at L terminal stance, and decreased WB through bilateral UEs. Speed gradually increases as distance progresses. VSS with ambulation. She is able to complete stair training and demonstrates good safety/strength with stairs. She feels confident entering/exiting home at discharge. She is able to complete all supine and seated exercises as instructed today. Pt has met all PT goals for discharge and is appropriate to return home with family and [facility] PT when medically stable. RN notified of goals met. Pt will benefit from PT services to address deficits in strength, balance, and mobility in order to return to full function at home.    Follow Up Recommendations  Home health PT     Equipment Recommendations  Rolling walker with 5" wheels    Recommendations for Other Services       Precautions / Restrictions Precautions Precautions: Knee Precaution Booklet Issued: Yes (comment) Required Braces or Orthoses: Knee Immobilizer - Left Knee Immobilizer - Left: Discontinue once straight leg raise with < 10 degree lag Restrictions Weight Bearing Restrictions: Yes LLE Weight Bearing: Weight bearing as tolerated    Mobility  Bed Mobility Overal bed mobility: Needs Assistance Bed Mobility: Sit to Supine     Supine to sit: Supervision     General bed mobility comments: HOB elevated, no use of bed rails but cues to utilize RLE to assist LLE to EOB. Pt  with difficulty with L hip flexion and abduction this morning. Moves slowly but no external assist provided by therapist.  Transfers Overall transfer level: Needs assistance Equipment used: Rolling walker (2 wheeled) Transfers: Sit to/from Stand Sit to Stand: Min guard         General transfer comment: Improving speed/sequencing today. Safe hand placement without cues today. Stable once upright in standing  Ambulation/Gait Ambulation/Gait assistance: Min guard Ambulation Distance (Feet): 280 Feet Assistive device: Rolling walker (2 wheeled) Gait Pattern/deviations: Decreased step length - right;Decreased stance time - left;Decreased weight shift to left Gait velocity: Decreased   General Gait Details: Pt is again able to complete a full lap around RN station with therapist. Cues for symmetrical step length, increase push off at L terminal stance, and decreased WB through bilateral UEs. Speed gradually increases as distance progresses. VSS   Stairs Stairs: Yes   Stair Management: Two rails;Step to pattern Number of Stairs: 4 General stair comments: Cues for proper sequencing with stairs. Pt demonstrates good stability and safety. Pt feels confident entering/exiting house upon discharge  Wheelchair Mobility    Modified Rankin (Stroke Patients Only)       Balance Overall balance assessment: Needs assistance Sitting-balance support: No upper extremity supported Sitting balance-Leahy Scale: Good     Standing balance support: Bilateral upper extremity supported Standing balance-Leahy Scale: Fair Standing balance comment: Able to maintain in standing without UE support today in wide stance  Cognition Arousal/Alertness: Awake/alert Behavior During Therapy: WFL for tasks assessed/performed Overall Cognitive Status: Within Functional Limits for tasks assessed                                        Exercises Total Joint  Exercises Ankle Circles/Pumps: Strengthening;Both;10 reps;Supine Quad Sets: Strengthening;Both;10 reps;Supine Gluteal Sets: Strengthening;Both;10 reps;Supine Hip ABduction/ADduction: Strengthening;Left;10 reps;Seated Long Arc Quad: Strengthening;Left;10 reps;Seated Knee Flexion: Strengthening;Left;10 reps;Seated Goniometric ROM: -3-91 degrees AAROM, pain limited for flexion Marching in Standing: Strengthening;Both;10 reps;Seated    General Comments        Pertinent Vitals/Pain Pain Assessment: 0-10 Pain Score: 3  Pain Location: L knee Pain Descriptors / Indicators: Aching Pain Intervention(s): Monitored during session    Home Living                      Prior Function            PT Goals (current goals can now be found in the care plan section) Acute Rehab PT Goals Patient Stated Goal: To return home PT Goal Formulation: With patient/family Time For Goal Achievement: 02/27/17 Potential to Achieve Goals: Good Progress towards PT goals: Progressing toward goals    Frequency    BID      PT Plan Current plan remains appropriate    Co-evaluation              AM-PAC PT "6 Clicks" Daily Activity  Outcome Measure  Difficulty turning over in bed (including adjusting bedclothes, sheets and blankets)?: A Little Difficulty moving from lying on back to sitting on the side of the bed? : A Little Difficulty sitting down on and standing up from a chair with arms (e.g., wheelchair, bedside commode, etc,.)?: A Little Help needed moving to and from a bed to chair (including a wheelchair)?: A Little Help needed walking in hospital room?: A Little Help needed climbing 3-5 steps with a railing? : A Little 6 Click Score: 18    End of Session Equipment Utilized During Treatment: Gait belt Activity Tolerance: Patient tolerated treatment well Patient left: with call bell/phone within reach;with SCD's reapplied;Other (comment);with chair alarm set;in chair (polar care  in place, towel roll under heel) Nurse Communication: Mobility status;Other (comment) (goals met, appropriate for DC with respect to therapy) PT Visit Diagnosis: Unsteadiness on feet (R26.81);Muscle weakness (generalized) (M62.81);Pain Pain - Right/Left: Left Pain - part of body: Knee     Time: 6568-1275 PT Time Calculation (min) (ACUTE ONLY): 31 min  Charges:  $Gait Training: 8-22 mins $Therapeutic Exercise: 8-22 mins                    G Codes:       Lyndel Safe Almadelia Looman PT, DPT     Ayra Hodgdon 02/14/2017, 9:55 AM

## 2017-02-14 NOTE — Care Management Important Message (Signed)
Important Message  Patient Details  Name: Natalie Gardner MRN: [id number] Date of Birth: [birth year]   Medicare Important Message Given:  Yes    Jolly Mango, RN 02/14/2017, 8:40 AM

## 2017-02-14 NOTE — Progress Notes (Signed)
ANTICOAGULATION CONSULT NOTE - FOLLOW UP   Pharmacy Consult for warfarin Indication: atrial fibrillation  Allergies  Allergen Reactions  . Penicillins     Yeast infections Has patient had a PCN reaction causing immediate rash, facial/tongue/throat swelling, SOB or lightheadedness with hypotension: No Has patient had a PCN reaction causing severe rash involving mucus membranes or skin necrosis: No Has patient had a PCN reaction that required hospitalization: No Has patient had a PCN reaction occurring within the last 10 years: No If all of the above answers are "NO", then may proceed with Cephalosporin use.   . Sulfa Antibiotics     Yeast infections   . Protonix [Pantoprazole Sodium] Rash    Patient Measurements: Height: 5\' 2"  (157.5 cm) Weight: 172 lb (78 kg) IBW/kg (Calculated) : 50.1  Labs:  Recent Labs  02/12/17 1113 02/13/17 0524 02/14/17 0414  HGB  --  9.8* 11.4*  HCT  --  29.5* 33.6*  PLT  --  215 268  APTT 28  --   --   LABPROT 15.4* 15.8* 18.6*  INR 1.23 1.27 1.57  CREATININE  --  0.87 0.93    Estimated Creatinine Clearance: 53.7 mL/min (by C-G formula based on SCr of 0.93 mg/dL).  Assessment: Pharmacy consulted to dose and monitor warfarin in this 71 year old female who was taking warfarin PTA for atrial fibrillation.   Per med rec, home dose alternates between 5 mg and 7.5 mg PO every other day. INR = 1.23 on admission (but most likely held for surgery)  INR: 1.57  Goal of Therapy:  INR 2-3 Monitor platelets by anticoagulation protocol: Yes   Plan:  Will continue home dose of warfarin. INR to be checked with AM labs tomorrow morning.  Larene Beach, PharmD, BCPS Clinical Pharmacist 02/14/2017,8:09 AM

## 2017-02-14 NOTE — Progress Notes (Signed)
   Subjective: 2 Days Post-Op Procedure(s) (LRB): COMPUTER ASSISTED TOTAL KNEE ARTHROPLASTY (Left) Patient reports pain as 5 on 0-10 scale.   Patient is well, and has had no acute complaints or problems Patient did very well with physical therapy yesterday. NEEDS to do today before being discharged his steps Plan is to go Home after hospital stay. no nausea and no vomiting Patient denies any chest pains or shortness of breath. Objective: Vital signs in last 24 hours: Temp:  [97.7 F (36.5 C)-99.1 F (37.3 C)] 99 F (37.2 C) (09/18 2028) Pulse Rate:  [80-111] 96 (09/18 2028) Resp:  [18] 18 (09/18 2028) BP: (105-138)/(46-75) 133/68 (09/18 2028) SpO2:  [98 %-100 %] 98 % (09/18 2028) well approximated incision Heels are non tender and elevated off the bed using rolled towels Intake/Output from previous day: 09/18 0701 - 09/19 0700 In: 600 [P.O.:600] Out: 270 [Drains:270] Intake/Output this shift: No intake/output data recorded.   Recent Labs  02/13/17 0524 02/14/17 0414  HGB 9.8* 11.4*    Recent Labs  02/13/17 0524 02/14/17 0414  WBC 8.8 13.4*  RBC 3.48* 4.03  HCT 29.5* 33.6*  PLT 215 268    Recent Labs  02/13/17 0524 02/14/17 0414  NA 135 129*  K 4.1 4.6  CL 106 101  CO2 24 22  BUN 18 15  CREATININE 0.87 0.93  GLUCOSE 133* 163*  CALCIUM 8.0* 8.5*    Recent Labs  02/13/17 0524 02/14/17 0414  INR 1.27 1.57    EXAM General - Patient is Alert, Appropriate and Oriented Extremity - Neurologically intact Neurovascular intact Sensation intact distally Intact pulses distally Dorsiflexion/Plantar flexion intact No cellulitis present Compartment soft Dressing - scant drainage Motor Function - intact, moving foot and toes well on exam.    Past Medical History:  Diagnosis Date  . Anemia   . Aortic valve regurgitation   . Arthritis   . Atrial fibrillation ([place])   . Cancer ([place]) 12/2016   Basal Cell Skin Cancer  . Cardiomyopathy ([place])   . CHF  (congestive heart failure) ([place])   . CKD (chronic kidney disease)   . Diverticulosis   . Dysrhythmia   . GERD (gastroesophageal reflux disease)   . GI bleed   . Heart murmur   . History of hiatal hernia   . Hypercholesterolemia   . Hyperlipidemia   . Hypertension   . Insomnia   . Osteopenia   . Pre-diabetes   . Urinary, incontinence, stress female   . Vertigo     Assessment/Plan: 2 Days Post-Op Procedure(s) (LRB): COMPUTER ASSISTED TOTAL KNEE ARTHROPLASTY (Left) Active Problems:   S/P total knee arthroplasty  Estimated body mass index is 31.46 kg/m as calculated from the following:   Height as of this encounter: 5\' 2"  (1.575 m).   Weight as of this encounter: 78 kg (172 lb). Up with therapy Discharge home with home health  Labs: INR 1.57 DVT Prophylaxis - Coumadin, Foot Pumps and TED hose Weight-Bearing as tolerated to left leg Hemovac discontinued on today's visit Please wash the operative leg and apply TED stockings to both legs. Please change dressing prior to being discharged and give the patient 2 extra honeycomb dressings to take home.  Jillyn Ledger. [place] Lander 02/14/2017, 7:05 AM

## 2017-02-14 NOTE — Care Management Note (Signed)
Case Management Note  Patient Details  Name: Natalie Gardner MRN: [id number] Date of Birth: 30-Apr-1946  Subjective/Objective:    Discharging today               Action/Plan: [facility] has delivered DME, [facility] notified of discharge. Home on coumadin  Expected Discharge Date:  02/14/17               Expected Discharge Plan:  [place]  In-House Referral:     Discharge planning Services  CM Consult  Post Acute Care Choice:  Durable Medical Equipment, Home Health Choice offered to:  Patient  DME Arranged:  Walker rolling DME Agency:  [place]:  PT [place]:  [facility] ([facility])  Status of Service:  Completed, signed off  If discussed at [employer] of Stay Meetings, dates discussed:    Additional Comments:  Jolly Mango, RN 02/14/2017, 8:37 AM

## 2017-02-15 DIAGNOSIS — I4891 Unspecified atrial fibrillation: Secondary | ICD-10-CM | POA: Diagnosis not present

## 2017-02-15 DIAGNOSIS — E1122 Type 2 diabetes mellitus with diabetic chronic kidney disease: Secondary | ICD-10-CM | POA: Diagnosis not present

## 2017-02-15 DIAGNOSIS — M1991 Primary osteoarthritis, unspecified site: Secondary | ICD-10-CM | POA: Diagnosis not present

## 2017-02-15 DIAGNOSIS — I130 Hypertensive heart and chronic kidney disease with heart failure and stage 1 through stage 4 chronic kidney disease, or unspecified chronic kidney disease: Secondary | ICD-10-CM | POA: Diagnosis not present

## 2017-02-15 DIAGNOSIS — Z471 Aftercare following joint replacement surgery: Secondary | ICD-10-CM | POA: Diagnosis not present

## 2017-02-15 DIAGNOSIS — D631 Anemia in chronic kidney disease: Secondary | ICD-10-CM | POA: Diagnosis not present

## 2017-02-15 DIAGNOSIS — I429 Cardiomyopathy, unspecified: Secondary | ICD-10-CM | POA: Diagnosis not present

## 2017-02-15 DIAGNOSIS — N189 Chronic kidney disease, unspecified: Secondary | ICD-10-CM | POA: Diagnosis not present

## 2017-02-15 DIAGNOSIS — I509 Heart failure, unspecified: Secondary | ICD-10-CM | POA: Diagnosis not present

## 2017-02-15 DIAGNOSIS — K5790 Diverticulosis of intestine, part unspecified, without perforation or abscess without bleeding: Secondary | ICD-10-CM | POA: Diagnosis not present

## 2017-02-15 DIAGNOSIS — I351 Nonrheumatic aortic (valve) insufficiency: Secondary | ICD-10-CM | POA: Diagnosis not present

## 2017-02-15 DIAGNOSIS — M8580 Other specified disorders of bone density and structure, unspecified site: Secondary | ICD-10-CM | POA: Diagnosis not present

## 2017-02-16 DIAGNOSIS — Z471 Aftercare following joint replacement surgery: Secondary | ICD-10-CM | POA: Diagnosis not present

## 2017-02-16 DIAGNOSIS — D631 Anemia in chronic kidney disease: Secondary | ICD-10-CM | POA: Diagnosis not present

## 2017-02-16 DIAGNOSIS — I351 Nonrheumatic aortic (valve) insufficiency: Secondary | ICD-10-CM | POA: Diagnosis not present

## 2017-02-16 DIAGNOSIS — I4891 Unspecified atrial fibrillation: Secondary | ICD-10-CM | POA: Diagnosis not present

## 2017-02-16 DIAGNOSIS — I509 Heart failure, unspecified: Secondary | ICD-10-CM | POA: Diagnosis not present

## 2017-02-16 DIAGNOSIS — I429 Cardiomyopathy, unspecified: Secondary | ICD-10-CM | POA: Diagnosis not present

## 2017-02-16 DIAGNOSIS — M8580 Other specified disorders of bone density and structure, unspecified site: Secondary | ICD-10-CM | POA: Diagnosis not present

## 2017-02-16 DIAGNOSIS — I130 Hypertensive heart and chronic kidney disease with heart failure and stage 1 through stage 4 chronic kidney disease, or unspecified chronic kidney disease: Secondary | ICD-10-CM | POA: Diagnosis not present

## 2017-02-16 DIAGNOSIS — N189 Chronic kidney disease, unspecified: Secondary | ICD-10-CM | POA: Diagnosis not present

## 2017-02-16 DIAGNOSIS — K5790 Diverticulosis of intestine, part unspecified, without perforation or abscess without bleeding: Secondary | ICD-10-CM | POA: Diagnosis not present

## 2017-02-16 DIAGNOSIS — M1991 Primary osteoarthritis, unspecified site: Secondary | ICD-10-CM | POA: Diagnosis not present

## 2017-02-16 DIAGNOSIS — E1122 Type 2 diabetes mellitus with diabetic chronic kidney disease: Secondary | ICD-10-CM | POA: Diagnosis not present

## 2017-02-19 DIAGNOSIS — N189 Chronic kidney disease, unspecified: Secondary | ICD-10-CM | POA: Diagnosis not present

## 2017-02-19 DIAGNOSIS — D631 Anemia in chronic kidney disease: Secondary | ICD-10-CM | POA: Diagnosis not present

## 2017-02-19 DIAGNOSIS — K5790 Diverticulosis of intestine, part unspecified, without perforation or abscess without bleeding: Secondary | ICD-10-CM | POA: Diagnosis not present

## 2017-02-19 DIAGNOSIS — E1122 Type 2 diabetes mellitus with diabetic chronic kidney disease: Secondary | ICD-10-CM | POA: Diagnosis not present

## 2017-02-19 DIAGNOSIS — I429 Cardiomyopathy, unspecified: Secondary | ICD-10-CM | POA: Diagnosis not present

## 2017-02-19 DIAGNOSIS — I351 Nonrheumatic aortic (valve) insufficiency: Secondary | ICD-10-CM | POA: Diagnosis not present

## 2017-02-19 DIAGNOSIS — M1991 Primary osteoarthritis, unspecified site: Secondary | ICD-10-CM | POA: Diagnosis not present

## 2017-02-19 DIAGNOSIS — I4891 Unspecified atrial fibrillation: Secondary | ICD-10-CM | POA: Diagnosis not present

## 2017-02-19 DIAGNOSIS — Z471 Aftercare following joint replacement surgery: Secondary | ICD-10-CM | POA: Diagnosis not present

## 2017-02-19 DIAGNOSIS — I130 Hypertensive heart and chronic kidney disease with heart failure and stage 1 through stage 4 chronic kidney disease, or unspecified chronic kidney disease: Secondary | ICD-10-CM | POA: Diagnosis not present

## 2017-02-19 DIAGNOSIS — M8580 Other specified disorders of bone density and structure, unspecified site: Secondary | ICD-10-CM | POA: Diagnosis not present

## 2017-02-19 DIAGNOSIS — I509 Heart failure, unspecified: Secondary | ICD-10-CM | POA: Diagnosis not present

## 2017-02-21 DIAGNOSIS — Z471 Aftercare following joint replacement surgery: Secondary | ICD-10-CM | POA: Diagnosis not present

## 2017-02-21 DIAGNOSIS — I509 Heart failure, unspecified: Secondary | ICD-10-CM | POA: Diagnosis not present

## 2017-02-21 DIAGNOSIS — D631 Anemia in chronic kidney disease: Secondary | ICD-10-CM | POA: Diagnosis not present

## 2017-02-21 DIAGNOSIS — I130 Hypertensive heart and chronic kidney disease with heart failure and stage 1 through stage 4 chronic kidney disease, or unspecified chronic kidney disease: Secondary | ICD-10-CM | POA: Diagnosis not present

## 2017-02-21 DIAGNOSIS — N189 Chronic kidney disease, unspecified: Secondary | ICD-10-CM | POA: Diagnosis not present

## 2017-02-21 DIAGNOSIS — E1122 Type 2 diabetes mellitus with diabetic chronic kidney disease: Secondary | ICD-10-CM | POA: Diagnosis not present

## 2017-02-21 DIAGNOSIS — I351 Nonrheumatic aortic (valve) insufficiency: Secondary | ICD-10-CM | POA: Diagnosis not present

## 2017-02-21 DIAGNOSIS — I429 Cardiomyopathy, unspecified: Secondary | ICD-10-CM | POA: Diagnosis not present

## 2017-02-21 DIAGNOSIS — M8580 Other specified disorders of bone density and structure, unspecified site: Secondary | ICD-10-CM | POA: Diagnosis not present

## 2017-02-21 DIAGNOSIS — I4891 Unspecified atrial fibrillation: Secondary | ICD-10-CM | POA: Diagnosis not present

## 2017-02-21 DIAGNOSIS — M1991 Primary osteoarthritis, unspecified site: Secondary | ICD-10-CM | POA: Diagnosis not present

## 2017-02-21 DIAGNOSIS — K5790 Diverticulosis of intestine, part unspecified, without perforation or abscess without bleeding: Secondary | ICD-10-CM | POA: Diagnosis not present

## 2017-02-23 DIAGNOSIS — M8580 Other specified disorders of bone density and structure, unspecified site: Secondary | ICD-10-CM | POA: Diagnosis not present

## 2017-02-23 DIAGNOSIS — I351 Nonrheumatic aortic (valve) insufficiency: Secondary | ICD-10-CM | POA: Diagnosis not present

## 2017-02-23 DIAGNOSIS — I130 Hypertensive heart and chronic kidney disease with heart failure and stage 1 through stage 4 chronic kidney disease, or unspecified chronic kidney disease: Secondary | ICD-10-CM | POA: Diagnosis not present

## 2017-02-23 DIAGNOSIS — I429 Cardiomyopathy, unspecified: Secondary | ICD-10-CM | POA: Diagnosis not present

## 2017-02-23 DIAGNOSIS — M1991 Primary osteoarthritis, unspecified site: Secondary | ICD-10-CM | POA: Diagnosis not present

## 2017-02-23 DIAGNOSIS — D631 Anemia in chronic kidney disease: Secondary | ICD-10-CM | POA: Diagnosis not present

## 2017-02-23 DIAGNOSIS — E1122 Type 2 diabetes mellitus with diabetic chronic kidney disease: Secondary | ICD-10-CM | POA: Diagnosis not present

## 2017-02-23 DIAGNOSIS — N189 Chronic kidney disease, unspecified: Secondary | ICD-10-CM | POA: Diagnosis not present

## 2017-02-23 DIAGNOSIS — K5790 Diverticulosis of intestine, part unspecified, without perforation or abscess without bleeding: Secondary | ICD-10-CM | POA: Diagnosis not present

## 2017-02-23 DIAGNOSIS — I4891 Unspecified atrial fibrillation: Secondary | ICD-10-CM | POA: Diagnosis not present

## 2017-02-23 DIAGNOSIS — Z471 Aftercare following joint replacement surgery: Secondary | ICD-10-CM | POA: Diagnosis not present

## 2017-02-23 DIAGNOSIS — I509 Heart failure, unspecified: Secondary | ICD-10-CM | POA: Diagnosis not present

## 2017-02-26 DIAGNOSIS — N189 Chronic kidney disease, unspecified: Secondary | ICD-10-CM | POA: Diagnosis not present

## 2017-02-26 DIAGNOSIS — I4891 Unspecified atrial fibrillation: Secondary | ICD-10-CM | POA: Diagnosis not present

## 2017-02-26 DIAGNOSIS — I429 Cardiomyopathy, unspecified: Secondary | ICD-10-CM | POA: Diagnosis not present

## 2017-02-26 DIAGNOSIS — E1122 Type 2 diabetes mellitus with diabetic chronic kidney disease: Secondary | ICD-10-CM | POA: Diagnosis not present

## 2017-02-26 DIAGNOSIS — I351 Nonrheumatic aortic (valve) insufficiency: Secondary | ICD-10-CM | POA: Diagnosis not present

## 2017-02-26 DIAGNOSIS — M8580 Other specified disorders of bone density and structure, unspecified site: Secondary | ICD-10-CM | POA: Diagnosis not present

## 2017-02-26 DIAGNOSIS — K5790 Diverticulosis of intestine, part unspecified, without perforation or abscess without bleeding: Secondary | ICD-10-CM | POA: Diagnosis not present

## 2017-02-26 DIAGNOSIS — I130 Hypertensive heart and chronic kidney disease with heart failure and stage 1 through stage 4 chronic kidney disease, or unspecified chronic kidney disease: Secondary | ICD-10-CM | POA: Diagnosis not present

## 2017-02-26 DIAGNOSIS — D631 Anemia in chronic kidney disease: Secondary | ICD-10-CM | POA: Diagnosis not present

## 2017-02-26 DIAGNOSIS — I509 Heart failure, unspecified: Secondary | ICD-10-CM | POA: Diagnosis not present

## 2017-02-26 DIAGNOSIS — M1991 Primary osteoarthritis, unspecified site: Secondary | ICD-10-CM | POA: Diagnosis not present

## 2017-02-26 DIAGNOSIS — Z471 Aftercare following joint replacement surgery: Secondary | ICD-10-CM | POA: Diagnosis not present

## 2017-02-27 DIAGNOSIS — Z96652 Presence of left artificial knee joint: Secondary | ICD-10-CM | POA: Diagnosis not present

## 2017-02-27 DIAGNOSIS — M6281 Muscle weakness (generalized): Secondary | ICD-10-CM | POA: Diagnosis not present

## 2017-02-27 DIAGNOSIS — M25562 Pain in left knee: Secondary | ICD-10-CM | POA: Diagnosis not present

## 2017-02-27 DIAGNOSIS — Z7901 Long term (current) use of anticoagulants: Secondary | ICD-10-CM | POA: Diagnosis not present

## 2017-02-27 DIAGNOSIS — M25662 Stiffness of left knee, not elsewhere classified: Secondary | ICD-10-CM | POA: Diagnosis not present

## 2017-03-01 DIAGNOSIS — M25562 Pain in left knee: Secondary | ICD-10-CM | POA: Diagnosis not present

## 2017-03-01 DIAGNOSIS — Z96652 Presence of left artificial knee joint: Secondary | ICD-10-CM | POA: Diagnosis not present

## 2017-03-05 DIAGNOSIS — Z96652 Presence of left artificial knee joint: Secondary | ICD-10-CM | POA: Diagnosis not present

## 2017-03-07 DIAGNOSIS — Z96652 Presence of left artificial knee joint: Secondary | ICD-10-CM | POA: Diagnosis not present

## 2017-03-09 DIAGNOSIS — Z96652 Presence of left artificial knee joint: Secondary | ICD-10-CM | POA: Diagnosis not present

## 2017-03-12 DIAGNOSIS — Z96652 Presence of left artificial knee joint: Secondary | ICD-10-CM | POA: Diagnosis not present

## 2017-03-12 DIAGNOSIS — M25562 Pain in left knee: Secondary | ICD-10-CM | POA: Diagnosis not present

## 2017-03-14 DIAGNOSIS — Z96652 Presence of left artificial knee joint: Secondary | ICD-10-CM | POA: Diagnosis not present

## 2017-03-14 DIAGNOSIS — M25562 Pain in left knee: Secondary | ICD-10-CM | POA: Diagnosis not present

## 2017-03-16 DIAGNOSIS — Z96652 Presence of left artificial knee joint: Secondary | ICD-10-CM | POA: Diagnosis not present

## 2017-03-19 DIAGNOSIS — Z96652 Presence of left artificial knee joint: Secondary | ICD-10-CM | POA: Diagnosis not present

## 2017-03-21 DIAGNOSIS — Z96652 Presence of left artificial knee joint: Secondary | ICD-10-CM | POA: Diagnosis not present

## 2017-03-23 DIAGNOSIS — Z96652 Presence of left artificial knee joint: Secondary | ICD-10-CM | POA: Diagnosis not present

## 2017-03-26 DIAGNOSIS — Z96652 Presence of left artificial knee joint: Secondary | ICD-10-CM | POA: Diagnosis not present

## 2017-03-27 DIAGNOSIS — Z96652 Presence of left artificial knee joint: Secondary | ICD-10-CM | POA: Diagnosis not present

## 2017-03-28 DIAGNOSIS — Z7901 Long term (current) use of anticoagulants: Secondary | ICD-10-CM | POA: Diagnosis not present

## 2017-04-10 ENCOUNTER — Ambulatory Visit (INDEPENDENT_AMBULATORY_CARE_PROVIDER_SITE_OTHER): Payer: Medicare Other | Admitting: Family Medicine

## 2017-04-10 ENCOUNTER — Encounter: Payer: Self-pay | Admitting: Family Medicine

## 2017-04-10 VITALS — BP 99/67 | HR 74 | Wt 165.0 lb

## 2017-04-10 DIAGNOSIS — G4700 Insomnia, unspecified: Secondary | ICD-10-CM | POA: Diagnosis not present

## 2017-04-10 DIAGNOSIS — I509 Heart failure, unspecified: Secondary | ICD-10-CM | POA: Diagnosis not present

## 2017-04-10 DIAGNOSIS — E119 Type 2 diabetes mellitus without complications: Secondary | ICD-10-CM

## 2017-04-10 DIAGNOSIS — I10 Essential (primary) hypertension: Secondary | ICD-10-CM

## 2017-04-10 DIAGNOSIS — E7849 Other hyperlipidemia: Secondary | ICD-10-CM | POA: Diagnosis not present

## 2017-04-10 LAB — BAYER DCA HB A1C WAIVED: HB A1C (BAYER DCA - WAIVED): 6.4 % (ref <7.0)

## 2017-04-10 MED ORDER — TRAZODONE HCL 50 MG PO TABS: 25.0000 mg | ORAL_TABLET | Freq: Every evening | ORAL | 3 refills | Status: DC | PRN

## 2017-04-10 NOTE — Assessment & Plan Note (Signed)
The current medical regimen is effective;  continue present plan and medications.  

## 2017-04-10 NOTE — Progress Notes (Signed)
BP 99/67   Pulse 74   Wt 165 lb (74.8 kg)   SpO2 98%   BMI 30.18 kg/m    Subjective:    Patient ID: Natalie Gardner, female    DOB: May 04, 1946, 71 y.o.   MRN: [id number]  HPI: Natalie Gardner is a 71 y.o. female  Chief Complaint  Patient presents with  . Follow-up  . Diabetes  Diabetes doing well with no low blood sugar spells or issues no complaints.Patient's CHF doing well rarely takes Lasix only does that one has swelling in her legs  Is taking spironolactone 25 and hydrochlorothiazide 25 mg a day and blood pressure is on the low side. Patient not complaining of any dizziness lightheadedor falling out type symptoms. Atrial fibrillation and warfarin doing well having INR checked monthly. Also taking cholesterol medicine without problems.  Relevant past medical, surgical, family and social history reviewed and updated as indicated. Interim medical history since our last visit reviewed. Allergies and medications reviewed and updated.  Review of Systems  Constitutional: Negative.   Respiratory: Negative.   Cardiovascular: Negative.     Per HPI unless specifically indicated above     Objective:    BP 99/67   Pulse 74   Wt 165 lb (74.8 kg)   SpO2 98%   BMI 30.18 kg/m   Wt Readings from Last 3 Encounters:  04/10/17 165 lb (74.8 kg)  02/12/17 172 lb (78 kg)  01/31/17 176 lb (79.8 kg)    Physical Exam  Constitutional: She is oriented to person, place, and time. She appears well-developed and well-nourished.  HENT:  Head: Normocephalic and atraumatic.  Eyes: Conjunctivae and EOM are normal.  Neck: Normal range of motion.  Cardiovascular: Normal rate, regular rhythm and normal heart sounds.  Pulmonary/Chest: Effort normal and breath sounds normal.  Musculoskeletal: Normal range of motion.  Neurological: She is alert and oriented to person, place, and time.  Skin: No erythema.  Psychiatric: She has a normal mood and affect. Her behavior is normal. Judgment and  thought content normal.    Results for orders placed or performed during the hospital encounter of 02/12/17  Protime-INR  Result Value Ref Range   Prothrombin Time 15.4 (H) 11.4 - 15.2 seconds   INR 1.23   APTT  Result Value Ref Range   aPTT 28 24 - 36 seconds  Glucose, capillary  Result Value Ref Range   Glucose-Capillary 114 (H) 65 - 99 mg/dL  Protime-INR  Result Value Ref Range   Prothrombin Time 15.8 (H) 11.4 - 15.2 seconds   INR 5.78   Basic metabolic panel  Result Value Ref Range   Sodium 135 135 - 145 mmol/L   Potassium 4.1 3.5 - 5.1 mmol/L   Chloride 106 101 - 111 mmol/L   CO2 24 22 - 32 mmol/L   Glucose, Bld 133 (H) 65 - 99 mg/dL   BUN 18 6 - 20 mg/dL   Creatinine, Ser 0.87 0.44 - 1.00 mg/dL   Calcium 8.0 (L) 8.9 - 10.3 mg/dL   GFR calc non Af Amer >60 >60 mL/min   GFR calc Af Amer >60 >60 mL/min   Anion gap 5 5 - 15  CBC  Result Value Ref Range   WBC 8.8 3.6 - 11.0 K/uL   RBC 3.48 (L) 3.80 - 5.20 MIL/uL   Hemoglobin 9.8 (L) 12.0 - 16.0 g/dL   HCT 29.5 (L) 35.0 - 47.0 %   MCV 84.7 80.0 - 100.0 fL  MCH 28.2 26.0 - 34.0 pg   MCHC 33.3 32.0 - 36.0 g/dL   RDW 15.7 (H) 11.5 - 14.5 %   Platelets 215 150 - 440 K/uL  Glucose, capillary  Result Value Ref Range   Glucose-Capillary 74 65 - 99 mg/dL  Protime-INR  Result Value Ref Range   Prothrombin Time 18.6 (H) 11.4 - 15.2 seconds   INR 1.06   Basic metabolic panel  Result Value Ref Range   Sodium 129 (L) 135 - 145 mmol/L   Potassium 4.6 3.5 - 5.1 mmol/L   Chloride 101 101 - 111 mmol/L   CO2 22 22 - 32 mmol/L   Glucose, Bld 163 (H) 65 - 99 mg/dL   BUN 15 6 - 20 mg/dL   Creatinine, Ser 0.93 0.44 - 1.00 mg/dL   Calcium 8.5 (L) 8.9 - 10.3 mg/dL   GFR calc non Af Amer >60 >60 mL/min   GFR calc Af Amer >60 >60 mL/min   Anion gap 6 5 - 15  CBC  Result Value Ref Range   WBC 13.4 (H) 3.6 - 11.0 K/uL   RBC 4.03 3.80 - 5.20 MIL/uL   Hemoglobin 11.4 (L) 12.0 - 16.0 g/dL   HCT 33.6 (L) 35.0 - 47.0 %   MCV 83.3  80.0 - 100.0 fL   MCH 28.4 26.0 - 34.0 pg   MCHC 34.1 32.0 - 36.0 g/dL   RDW 15.7 (H) 11.5 - 14.5 %   Platelets 268 150 - 440 K/uL  ABO/Rh  Result Value Ref Range   ABO/RH(D) A NEG       Assessment & Plan:   Problem List Items Addressed This Visit      Cardiovascular and Mediastinum   Essential hypertension - Primary    Discussed blood pressure being too low. We'll try to do a balance between fluid pills for blood pressureand fluid pills for CHF. We will decrease hydrochlorothiazide from 25 mg to 12.5 mg. Observe blood pressure and fluid response.      Relevant Orders   Bayer DCA Hb A1c Waived   CHF (congestive heart failure) (HCC)    The current medical regimen is effective;  continue present plan and medications.         Endocrine   Diabetes mellitus without complication ([place])    The current medical regimen is effective;  continue present plan and medications.       Relevant Orders   Bayer DCA Hb A1c Waived     Other   Hyperlipidemia   Relevant Orders   Bayer DCA Hb A1c Waived   Insomnia    Discuss insomnia. Patient watching TV until midnight we'll turn off the TV at 9 PM. Then read Also we'll adjust lifestyle so doesn't have to wake up at 4:30 by alarm. We'll also try trazodone 25-50 mg.          Follow up plan: Return in about 4 weeks (around 05/08/2017) for Blood pressure check and insomnia check.

## 2017-04-10 NOTE — Assessment & Plan Note (Signed)
Discuss insomnia. Patient watching TV until midnight we'll turn off the TV at 9 PM. Then read Also we'll adjust lifestyle so doesn't have to wake up at 4:30 by alarm. We'll also try trazodone 25-50 mg.

## 2017-04-10 NOTE — Assessment & Plan Note (Addendum)
Discussed blood pressure being too low. We'll try to do a balance between fluid pills for blood pressureand fluid pills for CHF. We will decrease hydrochlorothiazide from 25 mg to 12.5 mg. Observe blood pressure and fluid response.

## 2017-04-26 DIAGNOSIS — Z7901 Long term (current) use of anticoagulants: Secondary | ICD-10-CM | POA: Diagnosis not present

## 2017-05-10 ENCOUNTER — Ambulatory Visit: Payer: Self-pay | Admitting: Family Medicine

## 2017-05-14 ENCOUNTER — Encounter: Payer: Self-pay | Admitting: Family Medicine

## 2017-05-14 ENCOUNTER — Ambulatory Visit (INDEPENDENT_AMBULATORY_CARE_PROVIDER_SITE_OTHER): Payer: Medicare Other | Admitting: Family Medicine

## 2017-05-14 DIAGNOSIS — E7849 Other hyperlipidemia: Secondary | ICD-10-CM

## 2017-05-14 DIAGNOSIS — I10 Essential (primary) hypertension: Secondary | ICD-10-CM | POA: Diagnosis not present

## 2017-05-14 DIAGNOSIS — I482 Chronic atrial fibrillation, unspecified: Secondary | ICD-10-CM

## 2017-05-14 DIAGNOSIS — I509 Heart failure, unspecified: Secondary | ICD-10-CM | POA: Diagnosis not present

## 2017-05-14 DIAGNOSIS — D649 Anemia, unspecified: Secondary | ICD-10-CM | POA: Diagnosis not present

## 2017-05-14 DIAGNOSIS — G4700 Insomnia, unspecified: Secondary | ICD-10-CM

## 2017-05-14 DIAGNOSIS — E119 Type 2 diabetes mellitus without complications: Secondary | ICD-10-CM | POA: Diagnosis not present

## 2017-05-14 NOTE — Assessment & Plan Note (Signed)
The current medical regimen is effective;  continue present plan and medications.  

## 2017-05-14 NOTE — Assessment & Plan Note (Addendum)
The current medical regimen is effective;  continue present plan and medications. Followed by cardiology.

## 2017-05-14 NOTE — Progress Notes (Signed)
BP 116/77   Pulse 68   Wt 178 lb (80.7 kg)   SpO2 98%   BMI 32.56 kg/m    Subjective:    Patient ID: Natalie Gardner, female    DOB: August 02, 1945, 71 y.o.   MRN: [id number]  HPI: Natalie Gardner is a 71 y.o. female  Chief Complaint  Patient presents with  . Hypertension  . Insomnia  Patient all in all doing well reviewed distances on the list without problems.  Has 3 different fluid pills that she takes on a mostly intermittent basis hydrochlorothiazide takes half a tablet for her blood pressure spironolactone and then with occasional furosemide and keeps her heart failure stable. Continue to take warfarin for atrial fibrillation no bleeding bruising issues. Diabetes doing stable not taking any medications hemoglobin A1c and September that in the hospital was normal.  Also done last month and it was 6.4.   Relevant past medical, surgical, family and social history reviewed and updated as indicated. Interim medical history since our last visit reviewed. Allergies and medications reviewed and updated.  Review of Systems  Constitutional: Negative.   Respiratory: Negative.   Cardiovascular: Negative.     Per HPI unless specifically indicated above     Objective:    BP 116/77   Pulse 68   Wt 178 lb (80.7 kg)   SpO2 98%   BMI 32.56 kg/m   Wt Readings from Last 3 Encounters:  05/14/17 178 lb (80.7 kg)  04/10/17 165 lb (74.8 kg)  02/12/17 172 lb (78 kg)    Physical Exam  Constitutional: She is oriented to person, place, and time. She appears well-developed and well-nourished.  HENT:  Head: Normocephalic and atraumatic.  Eyes: Conjunctivae and EOM are normal.  Neck: Normal range of motion.  Cardiovascular: Normal rate, regular rhythm and normal heart sounds.  Pulmonary/Chest: Effort normal and breath sounds normal.  Musculoskeletal: Normal range of motion.  Neurological: She is alert and oriented to person, place, and time.  Skin: No erythema.  Psychiatric: She has  a normal mood and affect. Her behavior is normal. Judgment and thought content normal.    Results for orders placed or performed in visit on 04/10/17  Bayer DCA Hb A1c Waived  Result Value Ref Range   Bayer DCA Hb A1c Waived 6.4 <7.0 %      Assessment & Plan:   Problem List Items Addressed This Visit      Cardiovascular and Mediastinum   Essential hypertension    The current medical regimen is effective;  continue present plan and medications.       Relevant Orders   Comprehensive metabolic panel   CHF (congestive heart failure) ([place])    The current medical regimen is effective;  continue present plan and medications. Followed by cardiology.      Relevant Orders   Comprehensive metabolic panel   Atrial fibrillation ([place])    Stable and followed by cardiology      Relevant Orders   Comprehensive metabolic panel     Endocrine   Diabetes mellitus without complication ([place])    Patient remains diet controlled without issues.      Relevant Orders   Comprehensive metabolic panel     Other   Hyperlipidemia    The current medical regimen is effective;  continue present plan and medications.       Relevant Orders   Comprehensive metabolic panel   Lipid panel   Insomnia    The current medical regimen  is effective;  continue present plan and medications.       Anemia    On review patient's last CBC done in the hospital showed some anemia will recheck CBC today.      Relevant Orders   CBC with Differential/Platelet       Follow up plan: Return in about 6 months (around 11/12/2017) for Physical Exam.

## 2017-05-14 NOTE — Assessment & Plan Note (Signed)
Patient remains diet controlled without issues.

## 2017-05-14 NOTE — Assessment & Plan Note (Signed)
Stable and followed by cardiology.

## 2017-05-14 NOTE — Assessment & Plan Note (Signed)
On review patient's last CBC done in the hospital showed some anemia will recheck CBC today.

## 2017-05-15 LAB — LIPID PANEL
CHOL/HDL RATIO: 2.9 ratio (ref 0.0–4.4)
CHOLESTEROL TOTAL: 143 mg/dL (ref 100–199)
HDL: 50 mg/dL (ref >39)
LDL CALC: 66 mg/dL (ref 0–99)
Triglycerides: 134 mg/dL (ref 0–149)
VLDL CHOLESTEROL CAL: 27 mg/dL (ref 5–40)

## 2017-05-15 LAB — CBC WITH DIFFERENTIAL/PLATELET
BASOS ABS: 0 10*3/uL (ref 0.0–0.2)
Basos: 0 %
EOS (ABSOLUTE): 0.1 10*3/uL (ref 0.0–0.4)
EOS: 2 %
HEMATOCRIT: 31.6 % — AB (ref 34.0–46.6)
HEMOGLOBIN: 10.1 g/dL — AB (ref 11.1–15.9)
IMMATURE GRANS (ABS): 0 10*3/uL (ref 0.0–0.1)
IMMATURE GRANULOCYTES: 0 %
LYMPHS ABS: 1.6 10*3/uL (ref 0.7–3.1)
LYMPHS: 26 %
MCH: 27.1 pg (ref 26.6–33.0)
MCHC: 32 g/dL (ref 31.5–35.7)
MCV: 85 fL (ref 79–97)
MONOCYTES: 8 %
Monocytes Absolute: 0.5 10*3/uL (ref 0.1–0.9)
Neutrophils Absolute: 4 10*3/uL (ref 1.4–7.0)
Neutrophils: 64 %
Platelets: 283 10*3/uL (ref 150–379)
RBC: 3.73 x10E6/uL — AB (ref 3.77–5.28)
RDW: 15.5 % — ABNORMAL HIGH (ref 12.3–15.4)
WBC: 6.3 10*3/uL (ref 3.4–10.8)

## 2017-05-15 LAB — COMPREHENSIVE METABOLIC PANEL
ALBUMIN: 3.8 g/dL (ref 3.5–4.8)
ALT: 16 IU/L (ref 0–32)
AST: 18 IU/L (ref 0–40)
Albumin/Globulin Ratio: 1.2 (ref 1.2–2.2)
Alkaline Phosphatase: 70 IU/L (ref 39–117)
BUN / CREAT RATIO: 26 (ref 12–28)
BUN: 26 mg/dL (ref 8–27)
Bilirubin Total: <0.2 mg/dL (ref 0.0–1.2)
CALCIUM: 9 mg/dL (ref 8.7–10.3)
CO2: 21 mmol/L (ref 20–29)
CREATININE: 1.01 mg/dL — AB (ref 0.57–1.00)
Chloride: 105 mmol/L (ref 96–106)
GFR, EST AFRICAN AMERICAN: 65 mL/min/{1.73_m2} (ref >59)
GFR, EST NON AFRICAN AMERICAN: 56 mL/min/{1.73_m2} — AB (ref >59)
GLOBULIN, TOTAL: 3.1 g/dL (ref 1.5–4.5)
GLUCOSE: 94 mg/dL (ref 65–99)
Potassium: 4.9 mmol/L (ref 3.5–5.2)
SODIUM: 138 mmol/L (ref 134–144)
TOTAL PROTEIN: 6.9 g/dL (ref 6.0–8.5)

## 2017-05-16 ENCOUNTER — Telehealth: Payer: Self-pay | Admitting: Family Medicine

## 2017-05-16 DIAGNOSIS — D649 Anemia, unspecified: Secondary | ICD-10-CM

## 2017-05-16 NOTE — Telephone Encounter (Signed)
Phone call Discussed with patient having some anemia again not taking any aspirin Advil and Aleve will recheck CBC and iron and iron binding capacity and reticulocyte count

## 2017-06-04 DIAGNOSIS — Z471 Aftercare following joint replacement surgery: Secondary | ICD-10-CM | POA: Diagnosis not present

## 2017-06-11 ENCOUNTER — Other Ambulatory Visit: Payer: Medicare Other

## 2017-06-11 DIAGNOSIS — D649 Anemia, unspecified: Secondary | ICD-10-CM

## 2017-06-12 ENCOUNTER — Telehealth: Payer: Self-pay | Admitting: Family Medicine

## 2017-06-12 DIAGNOSIS — K922 Gastrointestinal hemorrhage, unspecified: Secondary | ICD-10-CM

## 2017-06-12 LAB — CBC WITH DIFFERENTIAL/PLATELET
Basophils Absolute: 0 10*3/uL (ref 0.0–0.2)
Basos: 0 %
EOS (ABSOLUTE): 0.1 10*3/uL (ref 0.0–0.4)
Eos: 2 %
Hematocrit: 30.2 % — ABNORMAL LOW (ref 34.0–46.6)
Hemoglobin: 9.3 g/dL — ABNORMAL LOW (ref 11.1–15.9)
Immature Grans (Abs): 0 10*3/uL (ref 0.0–0.1)
Immature Granulocytes: 0 %
Lymphocytes Absolute: 1.4 10*3/uL (ref 0.7–3.1)
Lymphs: 21 %
MCH: 26.3 pg — ABNORMAL LOW (ref 26.6–33.0)
MCHC: 30.8 g/dL — ABNORMAL LOW (ref 31.5–35.7)
MCV: 86 fL (ref 79–97)
Monocytes Absolute: 0.3 10*3/uL (ref 0.1–0.9)
Monocytes: 5 %
Neutrophils Absolute: 4.8 10*3/uL (ref 1.4–7.0)
Neutrophils: 72 %
Platelets: 289 10*3/uL (ref 150–379)
RBC: 3.53 x10E6/uL — ABNORMAL LOW (ref 3.77–5.28)
RDW: 14.6 % (ref 12.3–15.4)
WBC: 6.7 10*3/uL (ref 3.4–10.8)

## 2017-06-12 LAB — IRON AND TIBC
IRON SATURATION: 6 % — AB (ref 15–55)
IRON: 22 ug/dL — AB (ref 27–139)
TIBC: 363 ug/dL (ref 250–450)
UIBC: 341 ug/dL (ref 118–369)

## 2017-06-12 LAB — FERRITIN: FERRITIN: 14 ng/mL — AB (ref 15–150)

## 2017-06-12 LAB — VITAMIN B12: Vitamin B-12: 729 pg/mL (ref 232–1245)

## 2017-06-12 LAB — RETICULOCYTES: Retic Ct Pct: 1.6 % (ref 0.6–2.6)

## 2017-06-12 MED ORDER — FERROUS SULFATE 325 (65 FE) MG PO TABS: 325.0000 mg | ORAL_TABLET | Freq: Two times a day (BID) | ORAL | 6 refills | Status: DC

## 2017-06-12 NOTE — Telephone Encounter (Signed)
See result note.  

## 2017-06-16 ENCOUNTER — Emergency Department: Payer: Medicare Other

## 2017-06-16 ENCOUNTER — Emergency Department
Admission: EM | Admit: 2017-06-16 | Discharge: 2017-06-16 | Disposition: A | Discharge status: Home / Self Care | Payer: Medicare Other | Attending: Emergency Medicine | Admitting: Emergency Medicine

## 2017-06-16 ENCOUNTER — Other Ambulatory Visit: Payer: Self-pay

## 2017-06-16 DIAGNOSIS — Y9389 Activity, other specified: Secondary | ICD-10-CM | POA: Insufficient documentation

## 2017-06-16 DIAGNOSIS — I509 Heart failure, unspecified: Secondary | ICD-10-CM | POA: Diagnosis not present

## 2017-06-16 DIAGNOSIS — N3000 Acute cystitis without hematuria: Secondary | ICD-10-CM | POA: Diagnosis not present

## 2017-06-16 DIAGNOSIS — I130 Hypertensive heart and chronic kidney disease with heart failure and stage 1 through stage 4 chronic kidney disease, or unspecified chronic kidney disease: Secondary | ICD-10-CM | POA: Diagnosis not present

## 2017-06-16 DIAGNOSIS — R531 Weakness: Secondary | ICD-10-CM

## 2017-06-16 DIAGNOSIS — Y999 Unspecified external cause status: Secondary | ICD-10-CM | POA: Diagnosis not present

## 2017-06-16 DIAGNOSIS — W010XXA Fall on same level from slipping, tripping and stumbling without subsequent striking against object, initial encounter: Secondary | ICD-10-CM | POA: Insufficient documentation

## 2017-06-16 DIAGNOSIS — S52122A Displaced fracture of head of left radius, initial encounter for closed fracture: Secondary | ICD-10-CM | POA: Insufficient documentation

## 2017-06-16 DIAGNOSIS — E1122 Type 2 diabetes mellitus with diabetic chronic kidney disease: Secondary | ICD-10-CM | POA: Insufficient documentation

## 2017-06-16 DIAGNOSIS — M25561 Pain in right knee: Secondary | ICD-10-CM | POA: Diagnosis not present

## 2017-06-16 DIAGNOSIS — Y929 Unspecified place or not applicable: Secondary | ICD-10-CM | POA: Insufficient documentation

## 2017-06-16 DIAGNOSIS — Z96652 Presence of left artificial knee joint: Secondary | ICD-10-CM | POA: Diagnosis not present

## 2017-06-16 DIAGNOSIS — S8001XA Contusion of right knee, initial encounter: Secondary | ICD-10-CM | POA: Diagnosis not present

## 2017-06-16 DIAGNOSIS — Z85828 Personal history of other malignant neoplasm of skin: Secondary | ICD-10-CM | POA: Diagnosis not present

## 2017-06-16 DIAGNOSIS — S59912A Unspecified injury of left forearm, initial encounter: Secondary | ICD-10-CM | POA: Diagnosis present

## 2017-06-16 DIAGNOSIS — N189 Chronic kidney disease, unspecified: Secondary | ICD-10-CM | POA: Diagnosis not present

## 2017-06-16 LAB — URINALYSIS, COMPLETE (UACMP) WITH MICROSCOPIC
Bacteria, UA: NONE SEEN
Bilirubin Urine: NEGATIVE
Glucose, UA: NEGATIVE mg/dL
Hgb urine dipstick: NEGATIVE
Ketones, ur: NEGATIVE mg/dL
Nitrite: NEGATIVE
PH: 5 (ref 5.0–8.0)
Protein, ur: NEGATIVE mg/dL
SPECIFIC GRAVITY, URINE: 1.023 (ref 1.005–1.030)

## 2017-06-16 LAB — CBC
HCT: 31.4 % — ABNORMAL LOW (ref 35.0–47.0)
Hemoglobin: 10 g/dL — ABNORMAL LOW (ref 12.0–16.0)
MCH: 26.3 pg (ref 26.0–34.0)
MCHC: 31.7 g/dL — ABNORMAL LOW (ref 32.0–36.0)
MCV: 82.9 fL (ref 80.0–100.0)
PLATELETS: 317 10*3/uL (ref 150–440)
RBC: 3.79 MIL/uL — ABNORMAL LOW (ref 3.80–5.20)
RDW: 15.4 % — AB (ref 11.5–14.5)
WBC: 9.5 10*3/uL (ref 3.6–11.0)

## 2017-06-16 LAB — BASIC METABOLIC PANEL
Anion gap: 11 (ref 5–15)
BUN: 29 mg/dL — AB (ref 6–20)
CHLORIDE: 106 mmol/L (ref 101–111)
CO2: 18 mmol/L — AB (ref 22–32)
CREATININE: 1.14 mg/dL — AB (ref 0.44–1.00)
Calcium: 8.9 mg/dL (ref 8.9–10.3)
GFR calc Af Amer: 54 mL/min — ABNORMAL LOW (ref >60)
GFR calc non Af Amer: 47 mL/min — ABNORMAL LOW (ref >60)
Glucose, Bld: 191 mg/dL — ABNORMAL HIGH (ref 65–99)
Potassium: 4.3 mmol/L (ref 3.5–5.1)
SODIUM: 135 mmol/L (ref 135–145)

## 2017-06-16 LAB — TROPONIN I

## 2017-06-16 LAB — GLUCOSE, CAPILLARY: GLUCOSE-CAPILLARY: 180 mg/dL — AB (ref 65–99)

## 2017-06-16 MED ORDER — CIPROFLOXACIN HCL 500 MG PO TABS: 500.0000 mg | ORAL_TABLET | Freq: Two times a day (BID) | ORAL | 0 refills | Status: AC

## 2017-06-16 MED ORDER — OXYCODONE-ACETAMINOPHEN 5-325 MG PO TABS: 1.0000 | ORAL_TABLET | Freq: Three times a day (TID) | ORAL | 0 refills | Status: DC | PRN

## 2017-06-16 MED ORDER — TRAMADOL HCL 50 MG PO TABS
50.0000 mg | ORAL_TABLET | Freq: Once | ORAL | Status: AC
  Administered 2017-06-16: 50 mg via ORAL
  Filled 2017-06-16: qty 1

## 2017-06-16 MED ORDER — CIPROFLOXACIN HCL 500 MG PO TABS
500.0000 mg | ORAL_TABLET | Freq: Once | ORAL | Status: AC
  Administered 2017-06-16: 500 mg via ORAL
  Filled 2017-06-16: qty 1

## 2017-06-16 NOTE — ED Triage Notes (Signed)
Pt came to ED via pov. Pt fell and is unsure why. Reports history of low hemoglobin. Also c/o left arm pain from elbow down

## 2017-06-16 NOTE — ED Provider Notes (Signed)
[facility] Emergency Department Provider Note       Time seen: ----------------------------------------- 5:00 PM on 06/16/2017 -----------------------------------------   I have reviewed the triage vital signs and the nursing notes.  HISTORY   Chief Complaint Weakness and Arm Pain    HPI Natalie Gardner is a 72 y.o. female with a history of anemia, A. fib, cardiomyopathy, CHF, chronic kidney disease who presents to the ED for a fall.  Patient arrives by private vehicle to the ER after a fall.  Patient states she is not sure why she fell.  She reports a history of low hemoglobin and is also complaining of left arm pain from the elbow down since the fall.  She also has had some right knee pain.  She denies any recent illness or other complaints.  She reports she is been eating and drinking normally.  Past Medical History:  Diagnosis Date  . Anemia   . Aortic valve regurgitation   . Arthritis   . Atrial fibrillation ([place])   . Cancer ([place]) 12/2016   Basal Cell Skin Cancer  . Cardiomyopathy ([place])   . CHF (congestive heart failure) ([place])   . CKD (chronic kidney disease)   . Diverticulosis   . Dysrhythmia   . GERD (gastroesophageal reflux disease)   . GI bleed   . Heart murmur   . History of hiatal hernia   . Hypercholesterolemia   . Hyperlipidemia   . Hypertension   . Insomnia   . Osteopenia   . Pre-diabetes   . Urinary, incontinence, stress female   . Vertigo     Patient Active Problem List   Diagnosis Date Noted  . Anemia 05/14/2017  . Insomnia 04/10/2017  . 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 Advanced care planning/counseling discussion 09/28/2016  . Essential hypertension 09/13/2015  . CHF (congestive heart failure) ([place]) 09/13/2015  . Atrial fibrillation ([place]) 09/13/2015  . Hyperlipidemia 09/13/2015  . Diabetes mellitus without complication ([place]) 28/78/6767    Past Surgical History:  Procedure  Laterality Date  . ANKLE SURGERY Right 1997   Fractured ankle  . COLONOSCOPY    . COLONOSCOPY WITH PROPOFOL N/A 02/08/2015   Procedure: COLONOSCOPY WITH PROPOFOL;  Surgeon: Manya Silvas, MD;  Location: [facility] ENDOSCOPY;  Service: Endoscopy;  Laterality: N/A;  . KNEE ARTHROPLASTY Left 02/12/2017   Procedure: COMPUTER ASSISTED TOTAL KNEE ARTHROPLASTY;  Surgeon: Dereck Leep, MD;  Location: [facility] ORS;  Service: Orthopedics;  Laterality: Left;  . SACROCOCCYGEAL ULCER REMOVAL    . TONSILLECTOMY      Allergies Penicillins; Sulfa antibiotics; and Protonix [pantoprazole sodium]  Social History Social History   Tobacco Use  . Smoking status: Never Smoker  . Smokeless tobacco: Never Used  Substance Use Topics  . Alcohol use: No  . Drug use: No    Review of Systems Constitutional: Negative for fever. Cardiovascular: Negative for chest pain. Respiratory: Negative for shortness of breath. Gastrointestinal: Negative for abdominal pain, vomiting and diarrhea. Genitourinary: Negative for dysuria. Musculoskeletal: Positive for left elbow pain and right knee pain Skin: Negative for rash. Neurological: Negative for headaches, focal weakness or numbness.  All systems negative/normal/unremarkable except as stated in the HPI  ____________________________________________   PHYSICAL EXAM:  VITAL SIGNS: ED Triage Vitals  Enc Vitals Group     BP 06/16/17 1615 (!) 103/59     Pulse Rate 06/16/17 1615 86     Resp 06/16/17 1615 16  Temp 06/16/17 1615 98.4 F (36.9 C)     Temp Source 06/16/17 1615 Oral     SpO2 06/16/17 1615 99 %     Weight 06/16/17 1613 176 lb (79.8 kg)     Height 06/16/17 1613 5\' 6"  (1.676 m)     Head Circumference --      Peak Flow --      Pain Score --      Pain Loc --      Pain Edu? --      Excl. in [place]? --     Constitutional: Alert and oriented. Well appearing and in no distress. Eyes: Conjunctivae are normal. Normal extraocular movements. ENT   Head:  Normocephalic and atraumatic.   Nose: No congestion/rhinnorhea.   Mouth/Throat: Mucous membranes are moist.   Neck: No stridor. Cardiovascular: Normal rate, regular rhythm. No murmurs, rubs, or gallops. Respiratory: Normal respiratory effort without tachypnea nor retractions. Breath sounds are clear and equal bilaterally. No wheezes/rales/rhonchi. Gastrointestinal: Soft and nontender. Normal bowel sounds Musculoskeletal: Tenderness noted over the left elbow with pain with range of motion of the elbow.  No obvious effusion is noted.  There is some ecchymosis and tenderness over the right knee. Neurologic:  Normal speech and language. No gross focal neurologic deficits are appreciated.  Skin:  Skin is warm, dry and intact.  Contusion noted over the right knee. Psychiatric: Mood and affect are normal. Speech and behavior are normal.  ____________________________________________  EKG: Interpreted by me.  Atrial fibrillation with a rate of 78 bpm, low voltage QRS, normal axis, normal QT.  ____________________________________________  ED COURSE:  As part of my medical decision making, I reviewed the following data within the [place] History obtained from family if available, nursing notes, old chart and ekg, as well as notes from prior ED visits. Patient presented for a fall, we will assess with labs and imaging as indicated at this time.   Procedures ____________________________________________   LABS (pertinent positives/negatives)  Labs Reviewed  BASIC METABOLIC PANEL - Abnormal; Notable for the following components:      Result Value   CO2 18 (*)    Glucose, Bld 191 (*)    BUN 29 (*)    Creatinine, Ser 1.14 (*)    GFR calc non Af Amer 47 (*)    GFR calc Af Amer 54 (*)    All other components within normal limits  CBC - Abnormal; Notable for the following components:   RBC 3.79 (*)    Hemoglobin 10.0 (*)    HCT 31.4 (*)    MCHC 31.7 (*)    RDW 15.4  (*)    All other components within normal limits  URINALYSIS, COMPLETE (UACMP) WITH MICROSCOPIC - Abnormal; Notable for the following components:   Color, Urine YELLOW (*)    APPearance CLOUDY (*)    Leukocytes, UA LARGE (*)    Squamous Epithelial / LPF 0-5 (*)    All other components within normal limits  GLUCOSE, CAPILLARY - Abnormal; Notable for the following components:   Glucose-Capillary 180 (*)    All other components within normal limits  TROPONIN I  CBG MONITORING, ED    RADIOLOGY Images were viewed by me Right knee IMPRESSION: No acute fracture or dislocation identified about the right knee.  Chondrocalcinosis usually associated with CPPD arthropathy. Left elbow IMPRESSION: Radial head fracture. Potential loose body fracture fragment in the joint. Consider CT elbow. ____________________________________________  DIFFERENTIAL DIAGNOSIS   Fall, contusion, abrasion, fracture,  dislocation, arrhythmia, dehydration, electrolyte abnormality, occult infection  FINAL ASSESSMENT AND PLAN  Fall, radial head fracture, UTI   Plan: Patient had presented for a fall. Patient's labs did reveal a UTI and she was started on Cipro orally here. Patient's imaging revealed a radial head fracture.  She was placed in a posterior long-arm splint and will be referred to orthopedics for outpatient follow-up.  Right knee x-rays were negative.  No clear etiology for her fall other than possible dehydration.  She is stable for outpatient follow-up.   Earleen Newport, MD   Note: This note was generated in part or whole with voice recognition software. Voice recognition is usually quite accurate but there are transcription errors that can and very often do occur. I apologize for any typographical errors that were not detected and corrected.     Earleen Newport, MD 06/16/17 [id number]

## 2017-06-16 NOTE — ED Notes (Signed)
Topez not working 

## 2017-06-19 DIAGNOSIS — W010XXA Fall on same level from slipping, tripping and stumbling without subsequent striking against object, initial encounter: Secondary | ICD-10-CM | POA: Diagnosis not present

## 2017-06-19 DIAGNOSIS — S52122A Displaced fracture of head of left radius, initial encounter for closed fracture: Secondary | ICD-10-CM | POA: Diagnosis not present

## 2017-06-19 LAB — URINE CULTURE: Special Requests: NORMAL

## 2017-06-20 DIAGNOSIS — S52122A Displaced fracture of head of left radius, initial encounter for closed fracture: Secondary | ICD-10-CM | POA: Insufficient documentation

## 2017-06-21 ENCOUNTER — Encounter
Admission: RE | Disposition: A | Discharge status: Home / Self Care | Payer: Self-pay | Source: Ambulatory Visit | Attending: Surgery

## 2017-06-21 ENCOUNTER — Encounter: Payer: Self-pay | Admitting: *Deleted

## 2017-06-21 ENCOUNTER — Ambulatory Visit: Payer: Medicare Other | Admitting: Anesthesiology

## 2017-06-21 ENCOUNTER — Other Ambulatory Visit: Payer: Self-pay

## 2017-06-21 ENCOUNTER — Ambulatory Visit
Admission: RE | Admit: 2017-06-21 | Discharge: 2017-06-21 | Disposition: A | Discharge status: Home / Self Care | Payer: Medicare Other | Source: Ambulatory Visit | Attending: Surgery | Admitting: Surgery

## 2017-06-21 DIAGNOSIS — Y929 Unspecified place or not applicable: Secondary | ICD-10-CM | POA: Diagnosis not present

## 2017-06-21 DIAGNOSIS — I482 Chronic atrial fibrillation: Secondary | ICD-10-CM | POA: Diagnosis not present

## 2017-06-21 DIAGNOSIS — I110 Hypertensive heart disease with heart failure: Secondary | ICD-10-CM | POA: Diagnosis not present

## 2017-06-21 DIAGNOSIS — E119 Type 2 diabetes mellitus without complications: Secondary | ICD-10-CM | POA: Insufficient documentation

## 2017-06-21 DIAGNOSIS — Z79899 Other long term (current) drug therapy: Secondary | ICD-10-CM | POA: Diagnosis not present

## 2017-06-21 DIAGNOSIS — S52122A Displaced fracture of head of left radius, initial encounter for closed fracture: Secondary | ICD-10-CM | POA: Insufficient documentation

## 2017-06-21 DIAGNOSIS — I10 Essential (primary) hypertension: Secondary | ICD-10-CM | POA: Insufficient documentation

## 2017-06-21 DIAGNOSIS — D649 Anemia, unspecified: Secondary | ICD-10-CM | POA: Insufficient documentation

## 2017-06-21 DIAGNOSIS — Z7901 Long term (current) use of anticoagulants: Secondary | ICD-10-CM | POA: Diagnosis not present

## 2017-06-21 DIAGNOSIS — E785 Hyperlipidemia, unspecified: Secondary | ICD-10-CM | POA: Insufficient documentation

## 2017-06-21 DIAGNOSIS — W19XXXA Unspecified fall, initial encounter: Secondary | ICD-10-CM | POA: Insufficient documentation

## 2017-06-21 DIAGNOSIS — G8918 Other acute postprocedural pain: Secondary | ICD-10-CM | POA: Diagnosis not present

## 2017-06-21 DIAGNOSIS — Y9301 Activity, walking, marching and hiking: Secondary | ICD-10-CM | POA: Insufficient documentation

## 2017-06-21 DIAGNOSIS — I509 Heart failure, unspecified: Secondary | ICD-10-CM | POA: Diagnosis not present

## 2017-06-21 DIAGNOSIS — K219 Gastro-esophageal reflux disease without esophagitis: Secondary | ICD-10-CM | POA: Diagnosis not present

## 2017-06-21 DIAGNOSIS — M1990 Unspecified osteoarthritis, unspecified site: Secondary | ICD-10-CM | POA: Diagnosis not present

## 2017-06-21 HISTORY — PX: RADIAL HEAD ARTHROPLASTY: SHX6044

## 2017-06-21 LAB — GLUCOSE, CAPILLARY
GLUCOSE-CAPILLARY: 131 mg/dL — AB (ref 65–99)
GLUCOSE-CAPILLARY: 92 mg/dL (ref 65–99)

## 2017-06-21 SURGERY — ARTHROPLASTY, RADIUS, HEAD
Anesthesia: Regional | Site: Arm Lower | Laterality: Left | Wound class: Clean

## 2017-06-21 MED ORDER — SODIUM CHLORIDE 0.9 % IV SOLN
INTRAVENOUS | Status: DC | PRN
  Administered 2017-06-21: 75 ug/min via INTRAVENOUS

## 2017-06-21 MED ORDER — MIDAZOLAM HCL 5 MG/5ML IJ SOLN
INTRAMUSCULAR | Status: DC | PRN
  Administered 2017-06-21: 1 mg via INTRAVENOUS

## 2017-06-21 MED ORDER — BUPIVACAINE HCL 0.5 % IJ SOLN
INTRAMUSCULAR | Status: DC | PRN
  Administered 2017-06-21: 20 mL

## 2017-06-21 MED ORDER — FAMOTIDINE 20 MG PO TABS
20.0000 mg | ORAL_TABLET | Freq: Once | ORAL | Status: AC
  Administered 2017-06-21: 20 mg via ORAL

## 2017-06-21 MED ORDER — MIDAZOLAM HCL 2 MG/2ML IJ SOLN
INTRAMUSCULAR | Status: AC
  Administered 2017-06-21: 1 mg via INTRAVENOUS
  Filled 2017-06-21: qty 2

## 2017-06-21 MED ORDER — OXYCODONE HCL 5 MG/5ML PO SOLN: 5.0000 mg | Freq: Once | ORAL | Status: DC | PRN

## 2017-06-21 MED ORDER — MIDAZOLAM HCL 2 MG/2ML IJ SOLN
INTRAMUSCULAR | Status: AC
  Filled 2017-06-21: qty 2

## 2017-06-21 MED ORDER — ACETAMINOPHEN 10 MG/ML IV SOLN
INTRAVENOUS | Status: DC | PRN
  Administered 2017-06-21: 1000 mg via INTRAVENOUS

## 2017-06-21 MED ORDER — CLINDAMYCIN PHOSPHATE 900 MG/50ML IV SOLN
900.0000 mg | Freq: Once | INTRAVENOUS | Status: AC
  Administered 2017-06-21: 900 mg via INTRAVENOUS

## 2017-06-21 MED ORDER — FENTANYL CITRATE (PF) 100 MCG/2ML IJ SOLN: 25.0000 ug | INTRAMUSCULAR | Status: DC | PRN

## 2017-06-21 MED ORDER — LACTATED RINGERS IV SOLN
INTRAVENOUS | Status: DC | PRN
  Administered 2017-06-21 (×2): via INTRAVENOUS

## 2017-06-21 MED ORDER — PROPOFOL 10 MG/ML IV BOLUS
INTRAVENOUS | Status: AC
  Filled 2017-06-21: qty 20

## 2017-06-21 MED ORDER — ROPIVACAINE HCL 5 MG/ML IJ SOLN
INTRAMUSCULAR | Status: AC
  Filled 2017-06-21: qty 30

## 2017-06-21 MED ORDER — ROPIVACAINE HCL 5 MG/ML IJ SOLN
INTRAMUSCULAR | Status: DC | PRN
  Administered 2017-06-21: 30 mL via PERINEURAL

## 2017-06-21 MED ORDER — NEOMYCIN-POLYMYXIN B GU 40-200000 IR SOLN
Status: DC | PRN
  Administered 2017-06-21: 2 mL

## 2017-06-21 MED ORDER — FAMOTIDINE 20 MG PO TABS
ORAL_TABLET | ORAL | Status: AC
  Filled 2017-06-21: qty 1

## 2017-06-21 MED ORDER — FENTANYL CITRATE (PF) 100 MCG/2ML IJ SOLN
INTRAMUSCULAR | Status: AC
  Filled 2017-06-21: qty 2

## 2017-06-21 MED ORDER — BUPIVACAINE HCL (PF) 0.5 % IJ SOLN
INTRAMUSCULAR | Status: AC
  Filled 2017-06-21: qty 30

## 2017-06-21 MED ORDER — OXYCODONE HCL 5 MG PO TABS: 5.0000 mg | ORAL_TABLET | ORAL | 0 refills | Status: DC | PRN

## 2017-06-21 MED ORDER — NEOMYCIN-POLYMYXIN B GU 40-200000 IR SOLN
Status: AC
  Filled 2017-06-21: qty 2

## 2017-06-21 MED ORDER — LIDOCAINE HCL (PF) 1 % IJ SOLN
INTRAMUSCULAR | Status: DC | PRN
  Administered 2017-06-21: 3 mL

## 2017-06-21 MED ORDER — LIDOCAINE HCL (PF) 2 % IJ SOLN
INTRAMUSCULAR | Status: AC
  Filled 2017-06-21: qty 10

## 2017-06-21 MED ORDER — PROPOFOL 500 MG/50ML IV EMUL
INTRAVENOUS | Status: DC | PRN
  Administered 2017-06-21: 100 ug/kg/min via INTRAVENOUS

## 2017-06-21 MED ORDER — PROMETHAZINE HCL 25 MG/ML IJ SOLN: 6.2500 mg | INTRAMUSCULAR | Status: DC | PRN

## 2017-06-21 MED ORDER — LIDOCAINE HCL (CARDIAC) 20 MG/ML IV SOLN
INTRAVENOUS | Status: DC | PRN
  Administered 2017-06-21: 60 mg via INTRAVENOUS

## 2017-06-21 MED ORDER — MEPERIDINE HCL 50 MG/ML IJ SOLN: 6.2500 mg | INTRAMUSCULAR | Status: DC | PRN

## 2017-06-21 MED ORDER — PROPOFOL 500 MG/50ML IV EMUL
INTRAVENOUS | Status: AC
Start: 2017-06-21 — End: 2017-06-21
  Filled 2017-06-21: qty 50

## 2017-06-21 MED ORDER — PROPOFOL 10 MG/ML IV BOLUS
INTRAVENOUS | Status: DC | PRN
  Administered 2017-06-21: 50 mg via INTRAVENOUS

## 2017-06-21 MED ORDER — LIDOCAINE HCL (PF) 1 % IJ SOLN
INTRAMUSCULAR | Status: AC
  Filled 2017-06-21: qty 5

## 2017-06-21 MED ORDER — FENTANYL CITRATE (PF) 100 MCG/2ML IJ SOLN
INTRAMUSCULAR | Status: AC
  Administered 2017-06-21: 50 ug via INTRAVENOUS
  Filled 2017-06-21: qty 2

## 2017-06-21 MED ORDER — MIDAZOLAM HCL 2 MG/2ML IJ SOLN
1.0000 mg | Freq: Once | INTRAMUSCULAR | Status: AC
  Administered 2017-06-21: 1 mg via INTRAVENOUS

## 2017-06-21 MED ORDER — FENTANYL CITRATE (PF) 100 MCG/2ML IJ SOLN
50.0000 ug | Freq: Once | INTRAMUSCULAR | Status: AC
  Administered 2017-06-21: 50 ug via INTRAVENOUS

## 2017-06-21 MED ORDER — PHENYLEPHRINE HCL 10 MG/ML IJ SOLN
INTRAMUSCULAR | Status: DC | PRN
  Administered 2017-06-21: 150 ug via INTRAVENOUS
  Administered 2017-06-21: 200 ug via INTRAVENOUS
  Administered 2017-06-21 (×2): 100 ug via INTRAVENOUS
  Administered 2017-06-21: 200 ug via INTRAVENOUS
  Administered 2017-06-21: 100 ug via INTRAVENOUS

## 2017-06-21 MED ORDER — OXYCODONE HCL 5 MG PO TABS: 5.0000 mg | ORAL_TABLET | Freq: Once | ORAL | Status: DC | PRN

## 2017-06-21 MED ORDER — SODIUM CHLORIDE 0.9 % IV SOLN
Freq: Once | INTRAVENOUS | Status: AC
  Administered 2017-06-21: 14:00:00 via INTRAVENOUS

## 2017-06-21 MED ORDER — CLINDAMYCIN PHOSPHATE 900 MG/50ML IV SOLN
INTRAVENOUS | Status: AC
  Filled 2017-06-21: qty 50

## 2017-06-21 SURGICAL SUPPLY — 46 items
BANDAGE ACE 4X5 VEL STRL LF (GAUZE/BANDAGES/DRESSINGS) ×3 IMPLANT
BLADE MED AGGRESSIVE (BLADE) ×3 IMPLANT
BLADE SURG SZ10 CARB STEEL (BLADE) ×3 IMPLANT
BNDG COHESIVE 4X5 TAN STRL (GAUZE/BANDAGES/DRESSINGS) ×3 IMPLANT
BNDG ESMARK 4X12 TAN STRL LF (GAUZE/BANDAGES/DRESSINGS) ×6 IMPLANT
CANISTER SUCT 1200ML W/VALVE (MISCELLANEOUS) ×3 IMPLANT
CHLORAPREP W/TINT 26ML (MISCELLANEOUS) ×3 IMPLANT
CORD BIP STRL DISP 12FT (MISCELLANEOUS) IMPLANT
CUFF TOURN 18 STER (MISCELLANEOUS) ×3 IMPLANT
CUFF TOURN 24 STER (MISCELLANEOUS) IMPLANT
DRAPE FLUOR MINI C-ARM 54X84 (DRAPES) ×3 IMPLANT
DRAPE SURG 17X11 SM STRL (DRAPES) ×6 IMPLANT
ELECT REM PT RETURN 9FT ADLT (ELECTROSURGICAL) ×3
ELECTRODE REM PT RTRN 9FT ADLT (ELECTROSURGICAL) ×1 IMPLANT
FORCEPS JEWEL BIP 4-3/4 STR (INSTRUMENTS) IMPLANT
GLOVE BIO SURGEON STRL SZ8 (GLOVE) ×9 IMPLANT
GLOVE INDICATOR 8.0 STRL GRN (GLOVE) ×3 IMPLANT
GOWN STRL REUS W/ TWL LRG LVL3 (GOWN DISPOSABLE) ×2 IMPLANT
GOWN STRL REUS W/ TWL XL LVL3 (GOWN DISPOSABLE) ×2 IMPLANT
GOWN STRL REUS W/TWL LRG LVL3 (GOWN DISPOSABLE) ×4
GOWN STRL REUS W/TWL XL LVL3 (GOWN DISPOSABLE) ×4
IMMOBILIZER SHDR LG LX 900803 (SOFTGOODS) ×3 IMPLANT
IMPL HEAD (Orthopedic Implant) ×1 IMPLANT
IMPL STEM WITH SCREW 8X29 (Stem) ×1 IMPLANT
IMPLANT HEAD (Orthopedic Implant) ×3 IMPLANT
IMPLANT STEM WITH SCREW 8X29 (Stem) ×3 IMPLANT
KIT RM TURNOVER STRD PROC AR (KITS) ×3 IMPLANT
LABEL OR SOLS (LABEL) ×3 IMPLANT
LOOP RED MAXI  1X406MM (MISCELLANEOUS)
LOOP VESSEL MAXI 1X406 RED (MISCELLANEOUS) IMPLANT
NDL SAFETY ECLIPSE 18X1.5 (NEEDLE) ×1 IMPLANT
NEEDLE HYPO 18GX1.5 SHARP (NEEDLE) ×2
NS IRRIG 500ML POUR BTL (IV SOLUTION) ×3 IMPLANT
PACK EXTREMITY ARMC (MISCELLANEOUS) ×3 IMPLANT
PAD CAST CTTN 4X4 STRL (SOFTGOODS) ×2 IMPLANT
PADDING CAST COTTON 4X4 STRL (SOFTGOODS) ×4
SPONGE LAP 18X18 5 PK (GAUZE/BANDAGES/DRESSINGS) ×3 IMPLANT
STAPLER SKIN PROX 35W (STAPLE) ×3 IMPLANT
STOCKINETTE IMPERVIOUS 9X36 MD (GAUZE/BANDAGES/DRESSINGS) ×3 IMPLANT
SUT PROLENE 4 0 PS 2 18 (SUTURE) ×3 IMPLANT
SUT VIC AB 0 CT2 27 (SUTURE) ×3 IMPLANT
SUT VIC AB 2-0 CT1 (SUTURE) ×6 IMPLANT
SUT VIC AB 4-0 SH 27 (SUTURE)
SUT VIC AB 4-0 SH 27XANBCTRL (SUTURE) IMPLANT
SUT VICRYL+ 3-0 36IN CT-1 (SUTURE) IMPLANT
SYR 10ML LL (SYRINGE) ×3 IMPLANT

## 2017-06-21 NOTE — Anesthesia Procedure Notes (Signed)
Anesthesia Regional Block: Supraclavicular block   Pre-Anesthetic Checklist: ,, timeout performed, Correct Patient, Correct Site, Correct Laterality, Correct Procedure, Correct Position, site marked, Risks and benefits discussed,  Surgical consent,  Pre-op evaluation,  At surgeon's request and post-op pain management  Laterality: Left  Prep: chloraprep       Needles:  Injection technique: Single-shot  Needle Type: Stimiplex     Needle Length: 10cm  Needle Gauge: 20     Additional Needles:   Procedures:,,,, ultrasound used (permanent image in chart),,,,  Narrative:  Start time: 06/21/2017 2:20 PM End time: 06/21/2017 2:27 PM Injection made incrementally with aspirations every 5 mL.  Performed by: Personally  Anesthesiologist: Emmie Niemann, MD  Additional Notes: Functioning IV was confirmed and monitors were applied.  A Stimuplex needle was used. Sterile prep and drape,hand hygiene and sterile gloves were used.  Negative aspiration and negative test dose prior to incremental administration of local anesthetic. The patient tolerated the procedure well.

## 2017-06-21 NOTE — OR Nursing (Signed)
TO PACU for Block

## 2017-06-21 NOTE — Op Note (Signed)
06/21/2017  5:09 PM  Patient:   Natalie Gardner  Pre-Op Diagnosis:   Displaced radial head fracture, left elbow.  Post-Op Diagnosis:   Same.  Procedure:   Radial head arthroplasty, left elbow.  Surgeon:   Pascal Lux, MD  Assistant:   Clearnce Sorrel, PA-S  Anesthesia:   IV sedation with interscalene block  Findings:   As above.  Complications:   None  EBL:   5 cc  Fluids:   1100 cc crystalloid  TT:   71 minutes at 250 mmHg  Drains:   None  Closure:   Staples  Implants:   Biomet ExploR 8 mm radial stem with 10 x 22 mm radial head implant  Brief Clinical Note:   The patient is a 72 year old female who sustained the above-noted injury several days ago as a result of a fall.  X-rays at that time demonstrated a displaced radial head fracture.  She was splinted and followed up with orthopedics. The patient presents at this time for definitive management of this injury.  Procedure:   The patient underwent placement of an interscalene block in the preoperative holding area by the anesthesiologist before she was brought into the operating room and lain in the supine position. After adequate IV sedation was obtained, the patient's left upper extremity was prepped with ChloraPrep solution before being draped sterilely. Preoperative antibiotics were administered. A timeout was performed to verify the appropriate surgical site before the limb was exsanguinated with an Esmarch and the tourniquet inflated to 250 mmHg. The elbow was assessed using FluoroScan imaging. There was no opening medially with valgus stressing, and no opening laterally to varus stressing. An approximately 5-6 inch curvilinear incision was made over the posterolateral aspect of the elbow. The incision was carried down through the subcutaneous tissues to expose the fascia. The anterior flap was elevated sufficiently to expose the common extensor origin. The extensor digitorum communis tendon was identified and incised  longitudinally to expose the annular ligament. The annular ligament was released to expose the radial head fracture. Some dissection was carried out anteriorly to elevate the capsular tissues off the lateral aspect of the lateral epicondylar region to improve visualization. The radial head fragments were manipulated and removed. The pieces were taken to the back table where they were put together, sized, and found to be best replicated by a 22 mm implant. The micro-oscillating saw was used to recut the heel neck before the proximal radius was rasped sequentially beginning with a 5 mm rasp and progressing to an 8 mm rasp. This provided excellent cortical chatter. The trial 8 mm stem was inserted and the 10 x 22 mm implant was trialed. The 10 x 22 mm implant demonstrated excellent stability to flexion and extension, as well as with pronation and supination. It also appeared to fit well under FluoroScan imaging in AP and lateral projections. The joint also was stable to varus and valgus stressing with this implant in place.   The trial implants were removed and the elbow joint thoroughly irrigated with sterile saline solution to remove any fracture hematoma. The permanent 8 mm stem was inserted before the 10 x 22 mm radial head implant was positioned and secured using the appropriate locking screw. Again the elbow was assessed both dynamically and by FluoroScan imaging with the findings as described above.  The wound was copiously irrigated with sterile saline solution before the annular ligament was reapproximated using 2-0 Vicryl interrupted sutures. The extensor digitorum communis tendon was reapproximated  using 2-0 Vicryl interrupted sutures before the subcutaneous tissues were closed in two layers using 2-0 Vicryl interrupted sutures. The skin was closed using staples. A total of 20 cc of 0.5% Sensorcaine with epinephrine was injected in and around the incision to help with postoperative analgesia before a  sterile bulky dressing was applied to the elbow. The patient was placed into a shoulder immobilizer before she was awakened, extubated, and returned to the recovery room in satisfactory condition after tolerating the procedure well.

## 2017-06-21 NOTE — H&P (Signed)
Paper H&P to be scanned into permanent record. H&P reviewed and patient re-examined. No changes. 

## 2017-06-21 NOTE — Discharge Instructions (Addendum)
AMBULATORY SURGERY  DISCHARGE INSTRUCTIONS   1) The drugs that you were given will stay in your system until tomorrow so for the next 24 hours you should not:  A) Drive an automobile B) Make any legal decisions C) Drink any alcoholic beverage   2) You may resume regular meals tomorrow.  Today it is better to start with liquids and gradually work up to solid foods.  You may eat anything you prefer, but it is better to start with liquids, then soup and crackers, and gradually work up to solid foods.   3) Please notify your doctor immediately if you have any unusual bleeding, trouble breathing, redness and pain at the surgery site, drainage, fever, or pain not relieved by medication. 4)   5) Your post-operative visit with Dr.                                     is: Date:                        Time:    Please call to schedule your post-operative visit.  6) Additional Instructions:     Interscalene Nerve Block (ISNB) Discharge Instructions   1.  For your surgery you have received an Interscalene Nerve Block. 2. Nerve Blocks affect many types of nerves, including nerves that control movement, pain and normal sensation.  You may experience feelings such as numbness, tingling, heaviness, weakness or the inability to move your arm or the feeling or sensation that your arm has "fallen asleep". 3. A nerve block can last for 2 - 36 hours or more depending on the medication used.  Usually the weakness wears off first.  The tingling and heaviness usually wear off next.  Finally you may start to notice pain.  Keep in mind that this may occur in any order.  once a nerve block starts to wear off it is usually completely gone within 60 minutes. 4. ISNB may cause mild shortness of breath, a hoarse voice, blurry vision, unequal pupils, or drooping of the face on the same side as the nerve block.  These symptoms will usually go away within 12 hours.  Very rarely the procedure itself can cause mild  seizures. 5. If needed, your surgeon will give you a prescription for pain medication.  It will take about 60 minutes for the oral pain medication to become fully effective.  So, it is recommended that you start taking this medication before the nerve block first begins to wear off, or when you first begin to feel discomfort. 6. Keep in mind that nerve blocks often wear off in the middle of the night.   If you are going to bed and the block has not started to wear off or you have not started to have any discomfort, consider setting an alarm for 2 to 3 hours, so you can assess your block.  If you notice the block is wearing off or you are starting to have discomfort, you can take your pain medication. 7. Take your pain medication only as prescribed.  Pain medication can cause sedation and decrease your breathing if you take more than you need for the level of pain that you have. 8. Nausea is a common side effect of many pain medications.  You may want to eat something before taking your pain medicine to prevent nausea. 9. After an Interscalene  nerve block, you cannot feel pain, pressure or extremes in temperature in the effected arm.  Because your arm is numb it is at an increased risk for injury.  To decrease the possibility of injury, please practice the following:  a. While you are awake change the position of your arm frequently to prevent too much pressure on any one area for prolonged periods of time. b.  If you have a cast or tight dressing, check the color or your fingers every couple of hours.  Call your surgeon with the appearance of any discoloration (white or blue). c. If you are given a sling to wear before you go home, please wear it  at all times until the block has completely worn off.  Do not get up at night without your sling. d. If you experience any problems or concerns, please contact your surgeon's office. e. If you experience severe or prolonged shortness of breath go to the nearest  emergency department.   Keep splint dry and intact. Keep hand elevated above heart level. Apply ice to affected area frequently. Take ibuprofen 600 mg TID with meals for 7-10 days, then as necessary. Take pain medication as prescribed or ES Tylenol when needed.  Return for follow-up in 10-14 days or as scheduled.

## 2017-06-21 NOTE — Anesthesia Post-op Follow-up Note (Signed)
Anesthesia QCDR form completed.        

## 2017-06-21 NOTE — Anesthesia Preprocedure Evaluation (Signed)
Anesthesia Evaluation  Patient identified by MRN, date of birth, ID band Patient awake    Reviewed: Allergy & Precautions, NPO status , Patient's Chart, lab work & pertinent test results  History of Anesthesia Complications Negative for: history of anesthetic complications  Airway Mallampati: II  TM Distance: >3 FB Neck ROM: Full    Dental no notable dental hx.    Pulmonary neg pulmonary ROS, neg sleep apnea, neg COPD,    breath sounds clear to auscultation- rhonchi (-) wheezing      Cardiovascular Exercise Tolerance: Good hypertension, Pt. on medications +CHF  (-) CAD, (-) Past MI, (-) Cardiac Stents and (-) CABG + dysrhythmias  Rhythm:Regular Rate:Normal - Systolic murmurs and - Diastolic murmurs Echo 0/17/49: NORMAL LEFT VENTRICULAR SYSTOLIC FUNCTION WITH MILD LVH MODERATE VALVULAR REGURGITATION (See above) NO VALVULAR STENOSIS MODERATE PHTN EF 55%   Neuro/Psych negative neurological ROS  negative psych ROS   GI/Hepatic hiatal hernia, GERD  ,  Endo/Other  neg diabetes  Renal/GU Renal InsufficiencyRenal disease     Musculoskeletal  (+) Arthritis ,   Abdominal (+) - obese,   Peds  Hematology  (+) anemia ,   Anesthesia Other Findings Past Medical History: No date: Anemia No date: Aortic valve regurgitation No date: Arthritis No date: Atrial fibrillation ([place]) 12/2016: Cancer ([place])     Comment:  Basal Cell Skin Cancer No date: Cardiomyopathy ([place]) No date: CHF (congestive heart failure) ([facility]) No date: CKD (chronic kidney disease) No date: Diverticulosis No date: Dysrhythmia No date: GERD (gastroesophageal reflux disease) No date: GI bleed No date: Heart murmur No date: History of hiatal hernia No date: Hypercholesterolemia No date: Hyperlipidemia No date: Hypertension No date: Insomnia No date: Osteopenia No date: Pre-diabetes No date: Urinary, incontinence, stress female No date: Vertigo   Reproductive/Obstetrics                             Anesthesia Physical Anesthesia Plan  ASA: III  Anesthesia Plan: General   Post-op Pain Management:  Regional for Post-op pain   Induction: Intravenous  PONV Risk Score and Plan: 2 and Propofol infusion  Airway Management Planned: Natural Airway  Additional Equipment:   Intra-op Plan:   Post-operative Plan:   Informed Consent: I have reviewed the patients History and Physical, chart, labs and discussed the procedure including the risks, benefits and alternatives for the proposed anesthesia with the patient or authorized representative who has indicated his/her understanding and acceptance.   Dental advisory given  Plan Discussed with: CRNA and Anesthesiologist  Anesthesia Plan Comments:         Anesthesia Quick Evaluation

## 2017-06-21 NOTE — Transfer of Care (Signed)
Immediate Anesthesia Transfer of Care Note  Patient: Natalie Gardner  Procedure(s) Performed: RADIAL HEAD ARTHROPLASTY (Left Arm Lower)  Patient Location: PACU  Anesthesia Type:General and Regional  Level of Consciousness: drowsy and patient cooperative  Airway & Oxygen Therapy: Patient Spontanous Breathing and Patient connected to face mask oxygen  Post-op Assessment: Report given to RN and Post -op Vital signs reviewed and stable  Post vital signs: Reviewed and stable  Last Vitals:  Vitals:   06/21/17 1511 06/21/17 1715  BP: (!) 93/53 (!) 86/54  Pulse: 89 75  Resp: 19 17  Temp:  36.8 C  SpO2: 100% 100%    Last Pain:  Vitals:   06/21/17 1456  TempSrc:   PainSc: Asleep         Complications: No apparent anesthesia complications

## 2017-06-22 ENCOUNTER — Encounter: Payer: Self-pay | Admitting: Surgery

## 2017-06-22 NOTE — Anesthesia Postprocedure Evaluation (Signed)
Anesthesia Post Note  Patient: Natalie Gardner  Procedure(s) Performed: RADIAL HEAD ARTHROPLASTY (Left Arm Lower)  Patient location during evaluation: PACU Anesthesia Type: Regional Level of consciousness: awake and alert and oriented Pain management: pain level controlled Vital Signs Assessment: post-procedure vital signs reviewed and stable Respiratory status: spontaneous breathing, nonlabored ventilation and respiratory function stable Cardiovascular status: blood pressure returned to baseline and stable Postop Assessment: no signs of nausea or vomiting Anesthetic complications: no     Last Vitals:  Vitals:   06/21/17 1755 06/21/17 1819  BP: (!) 92/47 (!) 94/45  Pulse: 73 67  Resp: 16 16  Temp:    SpO2: 100% 99%    Last Pain:  Vitals:   06/21/17 1755  TempSrc:   PainSc: 0-No pain                 Tayja Manzer

## 2017-07-04 DIAGNOSIS — Z96622 Presence of left artificial elbow joint: Secondary | ICD-10-CM | POA: Diagnosis not present

## 2017-07-04 DIAGNOSIS — M25522 Pain in left elbow: Secondary | ICD-10-CM | POA: Diagnosis not present

## 2017-07-04 DIAGNOSIS — S52122D Displaced fracture of head of left radius, subsequent encounter for closed fracture with routine healing: Secondary | ICD-10-CM | POA: Diagnosis not present

## 2017-07-04 DIAGNOSIS — R29898 Other symptoms and signs involving the musculoskeletal system: Secondary | ICD-10-CM | POA: Diagnosis not present

## 2017-07-04 DIAGNOSIS — M25622 Stiffness of left elbow, not elsewhere classified: Secondary | ICD-10-CM | POA: Diagnosis not present

## 2017-07-05 DIAGNOSIS — I482 Chronic atrial fibrillation: Secondary | ICD-10-CM | POA: Diagnosis not present

## 2017-07-05 DIAGNOSIS — I340 Nonrheumatic mitral (valve) insufficiency: Secondary | ICD-10-CM | POA: Diagnosis not present

## 2017-07-05 DIAGNOSIS — I10 Essential (primary) hypertension: Secondary | ICD-10-CM | POA: Diagnosis not present

## 2017-07-05 DIAGNOSIS — I071 Rheumatic tricuspid insufficiency: Secondary | ICD-10-CM | POA: Diagnosis not present

## 2017-07-05 DIAGNOSIS — E782 Mixed hyperlipidemia: Secondary | ICD-10-CM | POA: Diagnosis not present

## 2017-07-11 DIAGNOSIS — M25522 Pain in left elbow: Secondary | ICD-10-CM | POA: Diagnosis not present

## 2017-07-11 DIAGNOSIS — I482 Chronic atrial fibrillation: Secondary | ICD-10-CM | POA: Diagnosis not present

## 2017-07-13 DIAGNOSIS — I071 Rheumatic tricuspid insufficiency: Secondary | ICD-10-CM | POA: Diagnosis not present

## 2017-07-13 DIAGNOSIS — I10 Essential (primary) hypertension: Secondary | ICD-10-CM | POA: Diagnosis not present

## 2017-07-13 DIAGNOSIS — I482 Chronic atrial fibrillation: Secondary | ICD-10-CM | POA: Diagnosis not present

## 2017-07-13 DIAGNOSIS — R791 Abnormal coagulation profile: Secondary | ICD-10-CM | POA: Diagnosis not present

## 2017-07-13 DIAGNOSIS — I340 Nonrheumatic mitral (valve) insufficiency: Secondary | ICD-10-CM | POA: Diagnosis not present

## 2017-07-13 DIAGNOSIS — R0602 Shortness of breath: Secondary | ICD-10-CM | POA: Diagnosis not present

## 2017-07-18 DIAGNOSIS — M25522 Pain in left elbow: Secondary | ICD-10-CM | POA: Diagnosis not present

## 2017-07-20 DIAGNOSIS — M25522 Pain in left elbow: Secondary | ICD-10-CM | POA: Diagnosis not present

## 2017-07-23 DIAGNOSIS — M25522 Pain in left elbow: Secondary | ICD-10-CM | POA: Diagnosis not present

## 2017-07-26 DIAGNOSIS — M25522 Pain in left elbow: Secondary | ICD-10-CM | POA: Diagnosis not present

## 2017-07-31 DIAGNOSIS — D509 Iron deficiency anemia, unspecified: Secondary | ICD-10-CM | POA: Diagnosis not present

## 2017-08-03 DIAGNOSIS — M25522 Pain in left elbow: Secondary | ICD-10-CM | POA: Diagnosis not present

## 2017-08-03 DIAGNOSIS — S52122D Displaced fracture of head of left radius, subsequent encounter for closed fracture with routine healing: Secondary | ICD-10-CM | POA: Diagnosis not present

## 2017-08-03 DIAGNOSIS — Z7901 Long term (current) use of anticoagulants: Secondary | ICD-10-CM | POA: Diagnosis not present

## 2017-08-07 DIAGNOSIS — M25522 Pain in left elbow: Secondary | ICD-10-CM | POA: Diagnosis not present

## 2017-08-09 DIAGNOSIS — M25622 Stiffness of left elbow, not elsewhere classified: Secondary | ICD-10-CM | POA: Diagnosis not present

## 2017-08-09 DIAGNOSIS — R29898 Other symptoms and signs involving the musculoskeletal system: Secondary | ICD-10-CM | POA: Diagnosis not present

## 2017-08-09 DIAGNOSIS — M25522 Pain in left elbow: Secondary | ICD-10-CM | POA: Diagnosis not present

## 2017-08-13 DIAGNOSIS — R791 Abnormal coagulation profile: Secondary | ICD-10-CM | POA: Diagnosis not present

## 2017-08-13 DIAGNOSIS — D509 Iron deficiency anemia, unspecified: Secondary | ICD-10-CM | POA: Diagnosis not present

## 2017-08-14 DIAGNOSIS — M25522 Pain in left elbow: Secondary | ICD-10-CM | POA: Diagnosis not present

## 2017-08-17 ENCOUNTER — Encounter: Payer: Self-pay | Admitting: *Deleted

## 2017-08-17 DIAGNOSIS — M25522 Pain in left elbow: Secondary | ICD-10-CM | POA: Diagnosis not present

## 2017-08-20 ENCOUNTER — Ambulatory Visit: Payer: Medicare Other | Admitting: Anesthesiology

## 2017-08-20 ENCOUNTER — Ambulatory Visit
Admission: RE | Admit: 2017-08-20 | Discharge: 2017-08-20 | Disposition: A | Discharge status: Home / Self Care | Payer: Medicare Other | Source: Ambulatory Visit | Attending: Unknown Physician Specialty | Admitting: Unknown Physician Specialty

## 2017-08-20 ENCOUNTER — Encounter
Admission: RE | Disposition: A | Discharge status: Home / Self Care | Payer: Self-pay | Source: Ambulatory Visit | Attending: Unknown Physician Specialty

## 2017-08-20 ENCOUNTER — Encounter: Payer: Self-pay | Admitting: *Deleted

## 2017-08-20 DIAGNOSIS — I429 Cardiomyopathy, unspecified: Secondary | ICD-10-CM | POA: Insufficient documentation

## 2017-08-20 DIAGNOSIS — K635 Polyp of colon: Secondary | ICD-10-CM | POA: Diagnosis not present

## 2017-08-20 DIAGNOSIS — Z1211 Encounter for screening for malignant neoplasm of colon: Secondary | ICD-10-CM | POA: Insufficient documentation

## 2017-08-20 DIAGNOSIS — Z86010 Personal history of colonic polyps: Secondary | ICD-10-CM | POA: Diagnosis not present

## 2017-08-20 DIAGNOSIS — M1990 Unspecified osteoarthritis, unspecified site: Secondary | ICD-10-CM | POA: Insufficient documentation

## 2017-08-20 DIAGNOSIS — E1122 Type 2 diabetes mellitus with diabetic chronic kidney disease: Secondary | ICD-10-CM | POA: Insufficient documentation

## 2017-08-20 DIAGNOSIS — N189 Chronic kidney disease, unspecified: Secondary | ICD-10-CM | POA: Diagnosis not present

## 2017-08-20 DIAGNOSIS — R011 Cardiac murmur, unspecified: Secondary | ICD-10-CM | POA: Insufficient documentation

## 2017-08-20 DIAGNOSIS — D122 Benign neoplasm of ascending colon: Secondary | ICD-10-CM | POA: Diagnosis not present

## 2017-08-20 DIAGNOSIS — I509 Heart failure, unspecified: Secondary | ICD-10-CM | POA: Diagnosis not present

## 2017-08-20 DIAGNOSIS — I4891 Unspecified atrial fibrillation: Secondary | ICD-10-CM | POA: Insufficient documentation

## 2017-08-20 DIAGNOSIS — G4700 Insomnia, unspecified: Secondary | ICD-10-CM | POA: Diagnosis not present

## 2017-08-20 DIAGNOSIS — Z800 Family history of malignant neoplasm of digestive organs: Secondary | ICD-10-CM | POA: Insufficient documentation

## 2017-08-20 DIAGNOSIS — K2950 Unspecified chronic gastritis without bleeding: Secondary | ICD-10-CM | POA: Insufficient documentation

## 2017-08-20 DIAGNOSIS — K640 First degree hemorrhoids: Secondary | ICD-10-CM | POA: Insufficient documentation

## 2017-08-20 DIAGNOSIS — K210 Gastro-esophageal reflux disease with esophagitis: Secondary | ICD-10-CM | POA: Diagnosis not present

## 2017-08-20 DIAGNOSIS — D649 Anemia, unspecified: Secondary | ICD-10-CM | POA: Diagnosis not present

## 2017-08-20 DIAGNOSIS — Z8249 Family history of ischemic heart disease and other diseases of the circulatory system: Secondary | ICD-10-CM | POA: Insufficient documentation

## 2017-08-20 DIAGNOSIS — K2970 Gastritis, unspecified, without bleeding: Secondary | ICD-10-CM | POA: Diagnosis not present

## 2017-08-20 DIAGNOSIS — I130 Hypertensive heart and chronic kidney disease with heart failure and stage 1 through stage 4 chronic kidney disease, or unspecified chronic kidney disease: Secondary | ICD-10-CM | POA: Insufficient documentation

## 2017-08-20 DIAGNOSIS — Z96652 Presence of left artificial knee joint: Secondary | ICD-10-CM | POA: Insufficient documentation

## 2017-08-20 DIAGNOSIS — D509 Iron deficiency anemia, unspecified: Secondary | ICD-10-CM | POA: Insufficient documentation

## 2017-08-20 DIAGNOSIS — Z79899 Other long term (current) drug therapy: Secondary | ICD-10-CM | POA: Insufficient documentation

## 2017-08-20 DIAGNOSIS — I351 Nonrheumatic aortic (valve) insufficiency: Secondary | ICD-10-CM | POA: Insufficient documentation

## 2017-08-20 DIAGNOSIS — Z820 Family history of epilepsy and other diseases of the nervous system: Secondary | ICD-10-CM | POA: Insufficient documentation

## 2017-08-20 DIAGNOSIS — E7800 Pure hypercholesterolemia, unspecified: Secondary | ICD-10-CM | POA: Insufficient documentation

## 2017-08-20 DIAGNOSIS — Z7901 Long term (current) use of anticoagulants: Secondary | ICD-10-CM | POA: Insufficient documentation

## 2017-08-20 DIAGNOSIS — K621 Rectal polyp: Secondary | ICD-10-CM | POA: Insufficient documentation

## 2017-08-20 DIAGNOSIS — Z888 Allergy status to other drugs, medicaments and biological substances status: Secondary | ICD-10-CM | POA: Insufficient documentation

## 2017-08-20 DIAGNOSIS — K315 Obstruction of duodenum: Secondary | ICD-10-CM | POA: Diagnosis not present

## 2017-08-20 DIAGNOSIS — K449 Diaphragmatic hernia without obstruction or gangrene: Secondary | ICD-10-CM | POA: Insufficient documentation

## 2017-08-20 DIAGNOSIS — Z882 Allergy status to sulfonamides status: Secondary | ICD-10-CM | POA: Insufficient documentation

## 2017-08-20 DIAGNOSIS — Z85828 Personal history of other malignant neoplasm of skin: Secondary | ICD-10-CM | POA: Insufficient documentation

## 2017-08-20 DIAGNOSIS — M8580 Other specified disorders of bone density and structure, unspecified site: Secondary | ICD-10-CM | POA: Diagnosis not present

## 2017-08-20 DIAGNOSIS — Z833 Family history of diabetes mellitus: Secondary | ICD-10-CM | POA: Insufficient documentation

## 2017-08-20 DIAGNOSIS — Z880 Allergy status to penicillin: Secondary | ICD-10-CM | POA: Insufficient documentation

## 2017-08-20 DIAGNOSIS — K2960 Other gastritis without bleeding: Secondary | ICD-10-CM | POA: Diagnosis not present

## 2017-08-20 HISTORY — PX: COLONOSCOPY WITH PROPOFOL: SHX5780

## 2017-08-20 HISTORY — DX: Dyspnea, unspecified: R06.00

## 2017-08-20 HISTORY — DX: Noninfective gastroenteritis and colitis, unspecified: K52.9

## 2017-08-20 HISTORY — PX: ESOPHAGOGASTRODUODENOSCOPY (EGD) WITH PROPOFOL: SHX5813

## 2017-08-20 SURGERY — ESOPHAGOGASTRODUODENOSCOPY (EGD) WITH PROPOFOL
Anesthesia: General

## 2017-08-20 MED ORDER — SODIUM CHLORIDE 0.9 % IV SOLN
INTRAVENOUS | Status: DC
  Administered 2017-08-20: 15:00:00 via INTRAVENOUS

## 2017-08-20 MED ORDER — LIDOCAINE HCL (PF) 2 % IJ SOLN
INTRAMUSCULAR | Status: AC
  Filled 2017-08-20: qty 10

## 2017-08-20 MED ORDER — LIDOCAINE HCL (PF) 2 % IJ SOLN
INTRAMUSCULAR | Status: DC | PRN
  Administered 2017-08-20: 60 mg

## 2017-08-20 MED ORDER — VANCOMYCIN HCL IN DEXTROSE 750-5 MG/150ML-% IV SOLN
750.0000 mg | Freq: Once | INTRAVENOUS | Status: AC
  Administered 2017-08-20: 750 mg via INTRAVENOUS
  Filled 2017-08-20: qty 150

## 2017-08-20 MED ORDER — FENTANYL CITRATE (PF) 100 MCG/2ML IJ SOLN
INTRAMUSCULAR | Status: AC
  Filled 2017-08-20: qty 2

## 2017-08-20 MED ORDER — PHENYLEPHRINE HCL 10 MG/ML IJ SOLN
INTRAMUSCULAR | Status: DC | PRN
  Administered 2017-08-20: 200 ug via INTRAVENOUS
  Administered 2017-08-20 (×5): 100 ug via INTRAVENOUS
  Administered 2017-08-20 (×2): 200 ug via INTRAVENOUS
  Administered 2017-08-20: 100 ug via INTRAVENOUS

## 2017-08-20 MED ORDER — SODIUM CHLORIDE 0.9 % IV SOLN
INTRAVENOUS | Status: DC
  Administered 2017-08-20: 13:00:00 via INTRAVENOUS

## 2017-08-20 MED ORDER — MIDAZOLAM HCL 5 MG/5ML IJ SOLN
INTRAMUSCULAR | Status: DC | PRN
  Administered 2017-08-20: 2 mg via INTRAVENOUS

## 2017-08-20 MED ORDER — PROPOFOL 10 MG/ML IV BOLUS
INTRAVENOUS | Status: DC | PRN
  Administered 2017-08-20: 30 mg via INTRAVENOUS

## 2017-08-20 MED ORDER — PROPOFOL 500 MG/50ML IV EMUL
INTRAVENOUS | Status: DC | PRN
  Administered 2017-08-20: 50 ug/kg/min via INTRAVENOUS

## 2017-08-20 MED ORDER — FENTANYL CITRATE (PF) 100 MCG/2ML IJ SOLN
INTRAMUSCULAR | Status: DC | PRN
  Administered 2017-08-20 (×2): 50 ug via INTRAVENOUS

## 2017-08-20 MED ORDER — GENTAMICIN IN SALINE 1.6-0.9 MG/ML-% IV SOLN
80.0000 mg | Freq: Once | INTRAVENOUS | Status: AC
  Administered 2017-08-20: 80 mg via INTRAVENOUS
  Filled 2017-08-20: qty 50

## 2017-08-20 MED ORDER — MIDAZOLAM HCL 2 MG/2ML IJ SOLN
INTRAMUSCULAR | Status: AC
  Filled 2017-08-20: qty 2

## 2017-08-20 NOTE — Transfer of Care (Signed)
Immediate Anesthesia Transfer of Care Note  Patient: Natalie Gardner  Procedure(s) Performed: ESOPHAGOGASTRODUODENOSCOPY (EGD) WITH PROPOFOL (N/A ) COLONOSCOPY WITH PROPOFOL (N/A )  Patient Location: PACU  Anesthesia Type:General  Level of Consciousness: sedated  Airway & Oxygen Therapy: Patient Spontanous Breathing and Patient connected to nasal cannula oxygen  Post-op Assessment: Report given to RN and Post -op Vital signs reviewed and stable  Post vital signs: Reviewed and stable  Last Vitals:  Vitals Value Taken Time  BP 92/52 08/20/2017  3:55 PM  Temp 35.8 C 08/20/2017  3:54 PM  Pulse 78 08/20/2017  3:55 PM  Resp 17 08/20/2017  3:55 PM  SpO2 99 % 08/20/2017  3:55 PM  Vitals shown include unvalidated device data.  Last Pain:  Vitals:   08/20/17 1554  TempSrc: Tympanic  PainSc:          Complications: No apparent anesthesia complications

## 2017-08-20 NOTE — H&P (Signed)
Primary Care Physician:  Guadalupe Maple, MD Primary Gastroenterologist:  Dr. Vira Agar  Pre-Procedure History & Physical: HPI:  Natalie Gardner is a 72 y.o. female is here for an endoscopy and colonoscopy.  Done for PH colon polyps and iron def anemia.   Past Medical History:  Diagnosis Date  . Anemia   . Aortic valve regurgitation   . Arthritis   . Atrial fibrillation ([place])   . Cancer ([place]) 12/2016   Basal Cell Skin Cancer  . Cardiomyopathy ([place])   . CHF (congestive heart failure) ([place])   . CKD (chronic kidney disease)   . Colitis   . Diverticulosis   . Dyspnea   . Dysrhythmia   . GERD (gastroesophageal reflux disease)   . GI bleed   . Heart murmur   . History of hiatal hernia   . Hypercholesterolemia   . Hyperlipidemia   . Hypertension   . Insomnia   . Osteopenia   . Pre-diabetes   . Urinary, incontinence, stress female   . Vertigo     Past Surgical History:  Procedure Laterality Date  . ANKLE SURGERY Right 1997   Fractured ankle  . COLONOSCOPY    . COLONOSCOPY WITH PROPOFOL N/A 02/08/2015   Procedure: COLONOSCOPY WITH PROPOFOL;  Surgeon: Manya Silvas, MD;  Location: [facility] ENDOSCOPY;  Service: Endoscopy;  Laterality: N/A;  . KNEE ARTHROPLASTY Left 02/12/2017   Procedure: COMPUTER ASSISTED TOTAL KNEE ARTHROPLASTY;  Surgeon: Dereck Leep, MD;  Location: [facility] ORS;  Service: Orthopedics;  Laterality: Left;  . RADIAL HEAD ARTHROPLASTY Left 06/21/2017   Procedure: RADIAL HEAD ARTHROPLASTY;  Surgeon: Corky Mull, MD;  Location: [facility] ORS;  Service: Orthopedics;  Laterality: Left;  . SACROCOCCYGEAL ULCER REMOVAL    . TONSILLECTOMY    . TOTAL KNEE ARTHROPLASTY Left     Prior to Admission medications   Medication Sig Start Date End Date Taking? Authorizing Provider  acetaminophen (TYLENOL) 650 MG CR tablet Take 1,300 mg by mouth at bedtime.   Yes [provider]  atorvastatin (LIPITOR) 10 MG tablet TAKE 1 TABLET BY MOUTH AT BEDTIME FOR CHOLESTEROL 09/28/16   Yes Crissman, Jeannette How, MD  diltiazem (DILACOR XR) 180 MG 24 hr capsule Take 180 mg by mouth daily.   Yes [provider]  diphenhydrAMINE (BENADRYL) 25 MG tablet Take 25 mg by mouth 2 (two) times daily as needed for allergies.   Yes [provider]  ferrous sulfate 325 (65 FE) MG tablet Take 1 tablet (325 mg total) by mouth 2 (two) times daily with a meal. 06/12/17  Yes Crissman, Jeannette How, MD  furosemide (LASIX) 20 MG tablet Take 20 mg by mouth daily as needed for edema.  09/21/14  Yes [provider]  hydrochlorothiazide (HYDRODIURIL) 25 MG tablet Take 1 tablet (25 mg total) by mouth daily. 09/28/16  Yes Crissman, Jeannette How, MD  lisinopril (PRINIVIL,ZESTRIL) 40 MG tablet Take 1 tablet (40 mg total) by mouth daily. 09/28/16  Yes Crissman, Jeannette How, MD  metoprolol succinate (TOPROL-XL) 50 MG 24 hr tablet Take 50 mg by mouth every evening.  03/23/16  Yes [provider]  Multiple Vitamin (MULTI-VITAMINS) TABS Take 1 tablet by mouth daily.   Yes [provider]  Omega-3 1000 MG CAPS Take 1,000 mg by mouth daily.    Yes [provider]  oxyCODONE (ROXICODONE) 5 MG immediate release tablet Take 1-2 tablets (5-10 mg total) by mouth every 4 (four) hours as needed. 06/21/17  Yes Poggi,  Marshall Cork, MD  ranitidine (ZANTAC) 150 MG tablet Take 150 mg by mouth daily.   Yes [provider]  spironolactone (ALDACTONE) 25 MG tablet Take 25 mg by mouth every evening.    Yes [provider]  sucralfate (CARAFATE) 1 G tablet Take 1 g by mouth 2 (two) times daily.  02/18/14  Yes [provider]  traZODone (DESYREL) 50 MG tablet Take 0.5-1 tablets (25-50 mg total) at bedtime as needed by mouth for sleep. 04/10/17  Yes Crissman, Jeannette How, MD  warfarin (COUMADIN) 2.5 MG tablet Take 2.5 mg every other day with the 5 mg tablet 03/27/16  Yes [provider]  warfarin (COUMADIN) 5 MG tablet Take 5 mg by mouth daily.   Yes [provider]     Allergies as of 08/09/2017 - Review Complete 06/21/2017  Allergen Reaction Noted  . Penicillins  01/26/2017  . Sulfa antibiotics  02/05/2015  . Protonix [pantoprazole sodium] Rash 03/16/2015    Family History  Problem Relation Age of Onset  . Heart disease Mother   . Diabetes Mother   . Heart disease Father   . Parkinson's disease Father   . Diabetes Brother   . Cancer Brother        Colon    Social History   Socioeconomic History  . Marital status: Married    Spouse name: Not on file  . Number of children: Not on file  . Years of education: Not on file  . Highest education level: Not on file  Occupational History  . Not on file  Social Needs  . Financial resource strain: Not on file  . Food insecurity:    Worry: Not on file    Inability: Not on file  . Transportation needs:    Medical: Not on file    Non-medical: Not on file  Tobacco Use  . Smoking status: Never Smoker  . Smokeless tobacco: Never Used  Substance and Sexual Activity  . Alcohol use: No  . Drug use: No  . Sexual activity: Not on file  Lifestyle  . Physical activity:    Days per week: Not on file    Minutes per session: Not on file  . Stress: Not on file  Relationships  . Social connections:    Talks on phone: Not on file    Gets together: Not on file    Attends religious service: Not on file    Active member of club or organization: Not on file    Attends meetings of clubs or organizations: Not on file    Relationship status: Not on file  . Intimate partner violence:    Fear of current or ex partner: Not on file    Emotionally abused: Not on file    Physically abused: Not on file    Forced sexual activity: Not on file  Other Topics Concern  . Not on file  Social History Narrative  . Not on file    Review of Systems: See HPI, otherwise negative ROS  Physical Exam: BP (!) 100/52   Pulse 69   Temp (!) 97.5 F (36.4 C) (Tympanic)   Resp 18   Ht 5\' 3"  (1.6 m)   Wt 80.7 kg  (178 lb)   SpO2 99%   BMI 31.53 kg/m  General:   Alert,  pleasant and cooperative in NAD Head:  Normocephalic and atraumatic. Neck:  Supple; no masses or thyromegaly. Lungs:  Clear throughout to auscultation.    Heart:  Regular rate and rhythm. Abdomen:  Soft, nontender and nondistended. Normal bowel sounds, without guarding, and without rebound.   Neurologic:  Alert and  oriented x4;  grossly normal neurologically.  Impression/Plan: Natalie Gardner is here for an endoscopy and colonoscopy to be performed for [facility] colon polyps and iron def anemia.  Risks, benefits, limitations, and alternatives regarding  endoscopy and colonoscopy have been reviewed with the patient.  Questions have been answered.  All parties agreeable.   Gaylyn Cheers, MD  08/20/2017, 3:00 PM

## 2017-08-20 NOTE — Anesthesia Post-op Follow-up Note (Signed)
Anesthesia QCDR form completed.        

## 2017-08-20 NOTE — Op Note (Signed)
[facility] Patient Name: Natalie Gardner Procedure Date: 08/20/2017 2:59 PM MRN: [id number] Account #: [id number] Date of Birth: [birth year] Admit Type: Outpatient Age: 72 Room: [facility] ENDO ROOM 3 Gender: Female Note Status: Finalized Procedure:            Upper GI endoscopy Indications:          Unexplained iron deficiency anemia Providers:            Manya Silvas, MD Referring MD:         Guadalupe Maple, MD (Referring MD) Medicines:            Propofol per Anesthesia Complications:        No immediate complications. Procedure:            Pre-Anesthesia Assessment:                       - After reviewing the risks and benefits, the patient                        was deemed in satisfactory condition to undergo the                        procedure.                       After obtaining informed consent, the endoscope was                        passed under direct vision. Throughout the procedure,                        the patient's blood pressure, pulse, and oxygen                        saturations were monitored continuously. The Endoscope                        was introduced through the mouth, and advanced to the                        second part of duodenum. The upper GI endoscopy was                        accomplished without difficulty. The patient tolerated                        the procedure well. Findings:      LA Grade A (one or more mucosal breaks less than 5 mm, not extending       between tops of 2 mucosal folds) esophagitis with no bleeding was found       40 cm from the incisors. Biopsies were taken with a cold forceps for       histology.      Diffuse and patchy mild inflammation characterized by erythema and       granularity was found in the gastric body and in the gastric antrum.       Biopsies were taken with a cold forceps for histology. Biopsies were       taken with a cold forceps for Helicobacter pylori testing.      The duodenal bulb and second  portion of the duodenum were normal. A mild       stricture between bulb and second portion. Impression:           - LA Grade A reflux esophagitis. Rule out Barrett's                        esophagus. Biopsied.                       - Gastritis. Biopsied.                       - Normal duodenal bulb and second portion of the                        duodenum. Recommendation:       - Await pathology results. Manya Silvas, MD 08/20/2017 3:17:00 PM This report has been signed electronically. Number of Addenda: 0 Note Initiated On: 08/20/2017 2:59 PM      [facility]

## 2017-08-20 NOTE — Op Note (Signed)
[facility] Patient Name: Natalie Gardner Procedure Date: 08/20/2017 2:59 PM MRN: [id number] Account #: [id number] Date of Birth: 1946-03-20 Admit Type: Outpatient Age: 72 Room: [facility] ENDO ROOM 3 Gender: Female Note Status: Finalized Procedure:            Colonoscopy Indications:          High risk colon cancer surveillance: Personal history                        of colonic polyps Providers:            Manya Silvas, MD Referring MD:         Guadalupe Maple, MD (Referring MD) Medicines:            Propofol per Anesthesia Complications:        No immediate complications. Procedure:            Pre-Anesthesia Assessment:                       - After reviewing the risks and benefits, the patient                        was deemed in satisfactory condition to undergo the                        procedure.                       After obtaining informed consent, the colonoscope was                        passed under direct vision. Throughout the procedure,                        the patient's blood pressure, pulse, and oxygen                        saturations were monitored continuously. The                        Colonoscope was introduced through the anus and                        advanced to the the cecum, identified by appendiceal                        orifice and ileocecal valve. The colonoscopy was                        somewhat difficult due to restricted mobility of the                        colon and significant looping. Successful completion of                        the procedure was aided by applying abdominal pressure.                        The patient tolerated the procedure well. The quality  of the bowel preparation was good. Findings:      A diminutive polyp was found in the rectum. The polyp was sessile. The       polyp was removed with a jumbo cold forceps. Resection and retrieval       were  complete.      A small polyp was found in the proximal ascending colon. The polyp was       sessile. The polyp was removed with a hot snare. Resection and retrieval       were complete.      Two sessile polyps were found in the proximal ascending colon. The       polyps were small in size. These polyps were removed with a hot snare.       Resection and retrieval were complete.      Internal hemorrhoids were found during endoscopy. The hemorrhoids were       small and Grade I (internal hemorrhoids that do not prolapse).      The exam was otherwise without abnormality. Impression:           - One diminutive polyp in the rectum, removed with a                        jumbo cold forceps. Resected and retrieved.                       - One small polyp in the proximal ascending colon,                        removed with a hot snare. Resected and retrieved.                       - Two small polyps in the proximal ascending colon,                        removed with a hot snare. Resected and retrieved.                       - Internal hemorrhoids.                       - The examination was otherwise normal. Recommendation:       - Await pathology results. Manya Silvas, MD 08/20/2017 3:56:28 PM This report has been signed electronically. Number of Addenda: 0 Note Initiated On: 08/20/2017 2:59 PM Scope Withdrawal Time: 0 hours 22 minutes 43 seconds  Total Procedure Duration: 0 hours 32 minutes 28 seconds       [facility]

## 2017-08-20 NOTE — Anesthesia Preprocedure Evaluation (Signed)
Anesthesia Evaluation  Patient identified by MRN, date of birth, ID band Patient awake    Reviewed: Allergy & Precautions, H&P , NPO status , reviewed documented beta blocker date and time   Airway Mallampati: III  TM Distance: >3 FB     Dental no notable dental hx. (+) Teeth Intact   Pulmonary shortness of breath,    Pulmonary exam normal        Cardiovascular hypertension, +CHF  + dysrhythmias + Valvular Problems/Murmurs  Rhythm:irregular  Echo 09/17/15: NORMAL LEFT VENTRICULAR SYSTOLIC FUNCTION WITH MILD LVH MODERATE VALVULAR REGURGITATION (See above) NO VALVULAR STENOSIS MODERATE PHTN EF 55%   Neuro/Psych    GI/Hepatic hiatal hernia, GERD  Controlled,  Endo/Other  diabetes  Renal/GU Renal disease     Musculoskeletal   Abdominal   Peds  Hematology  (+) anemia ,   Anesthesia Other Findings Past Medical History: No date: Anemia No date: Aortic valve regurgitation No date: Arthritis No date: Atrial fibrillation ([place]) 12/2016: Cancer ([place])     Comment:  Basal Cell Skin Cancer No date: Cardiomyopathy ([place]) No date: CHF (congestive heart failure) ([facility]) No date: CKD (chronic kidney disease) No date: Diverticulosis No date: Dysrhythmia No date: GERD (gastroesophageal reflux disease) No date: GI bleed No date: Heart murmur No date: History of hiatal hernia No date: Hypercholesterolemia No date: Hyperlipidemia No date: Hypertension No date: Insomnia No date: Osteopenia No date: Pre-diabetes No date: Urinary, incontinence, stress female No date: Vertigo  Reproductive/Obstetrics                             Anesthesia Physical Anesthesia Plan  ASA: III  Anesthesia Plan: General   Post-op Pain Management:    Induction:   PONV Risk Score and Plan: 3 and Propofol infusion and TIVA  Airway Management Planned:   Additional Equipment:   Intra-op Plan:   Post-operative  Plan:   Informed Consent: I have reviewed the patients History and Physical, chart, labs and discussed the procedure including the risks, benefits and alternatives for the proposed anesthesia with the patient or authorized representative who has indicated his/her understanding and acceptance.   Dental Advisory Given  Plan Discussed with: CRNA  Anesthesia Plan Comments:         Anesthesia Quick Evaluation

## 2017-08-21 ENCOUNTER — Encounter: Payer: Self-pay | Admitting: Unknown Physician Specialty

## 2017-08-21 NOTE — Anesthesia Postprocedure Evaluation (Signed)
Anesthesia Post Note  Patient: Natalie Gardner  Procedure(s) Performed: ESOPHAGOGASTRODUODENOSCOPY (EGD) WITH PROPOFOL (N/A ) COLONOSCOPY WITH PROPOFOL (N/A )  Patient location during evaluation: Endoscopy Anesthesia Type: General Level of consciousness: awake and alert Pain management: pain level controlled Vital Signs Assessment: post-procedure vital signs reviewed and stable Respiratory status: spontaneous breathing, nonlabored ventilation and respiratory function stable Cardiovascular status: blood pressure returned to baseline and stable Postop Assessment: no apparent nausea or vomiting Anesthetic complications: no     Last Vitals:  Vitals:   08/20/17 1620 08/20/17 1630  BP: (!) 92/47 (!) 98/56  Pulse: 92 92  Resp: (!) 21 16  Temp:    SpO2: 98% 98%    Last Pain:  Vitals:   08/21/17 0754  TempSrc:   PainSc: 0-No pain                 Alphonsus Sias

## 2017-08-22 LAB — SURGICAL PATHOLOGY

## 2017-09-05 ENCOUNTER — Telehealth: Payer: Self-pay

## 2017-09-05 DIAGNOSIS — R937 Abnormal findings on diagnostic imaging of other parts of musculoskeletal system: Secondary | ICD-10-CM

## 2017-09-05 NOTE — Telephone Encounter (Signed)
Copied from [place] [id number]. Topic: Inquiry >> Aug 31, 2017 10:08 AM Aurelio Brash B wrote: Reason for CRM:  Vicente Males from [facility] called to say pt is due for her bone density test  and wants to know if  the office will be setting up that apt for her.       Anna's contact number is  [phone] >> Sep 04, 2017  8:17 AM Amada Kingfisher, CMA wrote: Patient reported bone density done in 2011. See no indication that patient has d/x of osteoporosis. Per guidelines 1 scan after 65 unless d/x of osteoporosis or starting a new osteoporosis medication. Please advise.  >> Sep 04, 2017  8:45 AM Guadalupe Maple, MD wrote: Please do schedule

## 2017-09-05 NOTE — Telephone Encounter (Signed)
done

## 2017-09-05 NOTE — Telephone Encounter (Signed)
Please place order. Unsure of appropriate to D/X due to screening guidelines.

## 2017-09-05 NOTE — Telephone Encounter (Signed)
Pt was called and given [place]'s contact information to schedule.

## 2017-09-17 ENCOUNTER — Other Ambulatory Visit: Payer: Self-pay | Admitting: Nurse Practitioner

## 2017-09-17 DIAGNOSIS — N183 Chronic kidney disease, stage 3 (moderate): Secondary | ICD-10-CM | POA: Diagnosis not present

## 2017-09-17 DIAGNOSIS — D509 Iron deficiency anemia, unspecified: Secondary | ICD-10-CM | POA: Diagnosis not present

## 2017-09-21 ENCOUNTER — Other Ambulatory Visit: Payer: Self-pay | Admitting: Nurse Practitioner

## 2017-09-21 DIAGNOSIS — D509 Iron deficiency anemia, unspecified: Secondary | ICD-10-CM

## 2017-09-24 ENCOUNTER — Ambulatory Visit: Payer: Medicare Other

## 2017-09-24 DIAGNOSIS — S52122D Displaced fracture of head of left radius, subsequent encounter for closed fracture with routine healing: Secondary | ICD-10-CM | POA: Diagnosis not present

## 2017-09-25 ENCOUNTER — Ambulatory Visit
Admission: RE | Admit: 2017-09-25 | Discharge: 2017-09-25 | Disposition: A | Discharge status: Home / Self Care | Payer: Medicare Other | Source: Ambulatory Visit | Attending: Nurse Practitioner | Admitting: Nurse Practitioner

## 2017-09-25 DIAGNOSIS — D509 Iron deficiency anemia, unspecified: Secondary | ICD-10-CM | POA: Diagnosis not present

## 2017-09-25 DIAGNOSIS — K449 Diaphragmatic hernia without obstruction or gangrene: Secondary | ICD-10-CM | POA: Insufficient documentation

## 2017-09-25 DIAGNOSIS — K219 Gastro-esophageal reflux disease without esophagitis: Secondary | ICD-10-CM | POA: Insufficient documentation

## 2017-10-24 DIAGNOSIS — Z7901 Long term (current) use of anticoagulants: Secondary | ICD-10-CM | POA: Diagnosis not present

## 2017-11-01 ENCOUNTER — Other Ambulatory Visit: Payer: Self-pay | Admitting: Family Medicine

## 2017-11-01 DIAGNOSIS — I10 Essential (primary) hypertension: Secondary | ICD-10-CM

## 2017-11-01 NOTE — Telephone Encounter (Signed)
Lisinopril 40 mg refill request  LOV 05/14/17 with [name]   Needs appt  Last refill:  09/28/16  #90   Refills:  4  [facility], [place]  - [address]

## 2017-11-07 ENCOUNTER — Ambulatory Visit: Payer: Medicare Other

## 2017-11-07 DIAGNOSIS — I482 Chronic atrial fibrillation: Secondary | ICD-10-CM | POA: Diagnosis not present

## 2017-11-07 DIAGNOSIS — I10 Essential (primary) hypertension: Secondary | ICD-10-CM | POA: Diagnosis not present

## 2017-11-07 DIAGNOSIS — E782 Mixed hyperlipidemia: Secondary | ICD-10-CM | POA: Diagnosis not present

## 2017-11-07 DIAGNOSIS — I5032 Chronic diastolic (congestive) heart failure: Secondary | ICD-10-CM | POA: Diagnosis not present

## 2017-11-14 ENCOUNTER — Encounter: Payer: Self-pay | Admitting: Family Medicine

## 2017-11-14 ENCOUNTER — Ambulatory Visit (INDEPENDENT_AMBULATORY_CARE_PROVIDER_SITE_OTHER): Payer: Medicare Other | Admitting: Family Medicine

## 2017-11-14 VITALS — BP 106/72 | HR 89 | Ht 62.0 in | Wt 184.0 lb

## 2017-11-14 DIAGNOSIS — E119 Type 2 diabetes mellitus without complications: Secondary | ICD-10-CM | POA: Diagnosis not present

## 2017-11-14 DIAGNOSIS — D649 Anemia, unspecified: Secondary | ICD-10-CM | POA: Diagnosis not present

## 2017-11-14 DIAGNOSIS — Z13220 Encounter for screening for lipoid disorders: Secondary | ICD-10-CM

## 2017-11-14 DIAGNOSIS — Z1329 Encounter for screening for other suspected endocrine disorder: Secondary | ICD-10-CM

## 2017-11-14 DIAGNOSIS — F321 Major depressive disorder, single episode, moderate: Secondary | ICD-10-CM

## 2017-11-14 DIAGNOSIS — Z0001 Encounter for general adult medical examination with abnormal findings: Secondary | ICD-10-CM

## 2017-11-14 DIAGNOSIS — E7849 Other hyperlipidemia: Secondary | ICD-10-CM

## 2017-11-14 DIAGNOSIS — I10 Essential (primary) hypertension: Secondary | ICD-10-CM

## 2017-11-14 DIAGNOSIS — I482 Chronic atrial fibrillation, unspecified: Secondary | ICD-10-CM

## 2017-11-14 DIAGNOSIS — Z1239 Encounter for other screening for malignant neoplasm of breast: Secondary | ICD-10-CM

## 2017-11-14 DIAGNOSIS — Z1231 Encounter for screening mammogram for malignant neoplasm of breast: Secondary | ICD-10-CM

## 2017-11-14 DIAGNOSIS — Z0000 Encounter for general adult medical examination without abnormal findings: Secondary | ICD-10-CM

## 2017-11-14 DIAGNOSIS — Z1159 Encounter for screening for other viral diseases: Secondary | ICD-10-CM | POA: Diagnosis not present

## 2017-11-14 DIAGNOSIS — Z7189 Other specified counseling: Secondary | ICD-10-CM

## 2017-11-14 LAB — MICROSCOPIC EXAMINATION

## 2017-11-14 LAB — URINALYSIS, ROUTINE W REFLEX MICROSCOPIC
Bilirubin, UA: NEGATIVE
Glucose, UA: NEGATIVE
KETONES UA: NEGATIVE
NITRITE UA: NEGATIVE
Protein, UA: NEGATIVE
SPEC GRAV UA: 1.01 (ref 1.005–1.030)
Urobilinogen, Ur: 0.2 mg/dL (ref 0.2–1.0)
pH, UA: 5 (ref 5.0–7.5)

## 2017-11-14 LAB — BAYER DCA HB A1C WAIVED: HB A1C (BAYER DCA - WAIVED): 5.9 % (ref <7.0)

## 2017-11-14 MED ORDER — VENLAFAXINE HCL ER 75 MG PO CP24: 75.0000 mg | ORAL_CAPSULE | Freq: Every day | ORAL | 1 refills | Status: DC

## 2017-11-14 MED ORDER — VENLAFAXINE HCL ER 37.5 MG PO CP24: 37.5000 mg | ORAL_CAPSULE | Freq: Every day | ORAL | 0 refills | Status: DC

## 2017-11-14 MED ORDER — TRAZODONE HCL 50 MG PO TABS: 25.0000 mg | ORAL_TABLET | Freq: Every evening | ORAL | 3 refills | Status: DC | PRN

## 2017-11-14 MED ORDER — LISINOPRIL 40 MG PO TABS: 40.0000 mg | ORAL_TABLET | Freq: Every day | ORAL | 4 refills | Status: DC

## 2017-11-14 MED ORDER — ATORVASTATIN CALCIUM 10 MG PO TABS: ORAL_TABLET | ORAL | 4 refills | Status: DC

## 2017-11-14 MED ORDER — HYDROCHLOROTHIAZIDE 25 MG PO TABS: 25.0000 mg | ORAL_TABLET | Freq: Every day | ORAL | 4 refills | Status: DC

## 2017-11-14 MED ORDER — FERROUS SULFATE 325 (65 FE) MG PO TABS: 325.0000 mg | ORAL_TABLET | Freq: Two times a day (BID) | ORAL | 6 refills | Status: DC

## 2017-11-14 MED ORDER — FUROSEMIDE 20 MG PO TABS: 20.0000 mg | ORAL_TABLET | Freq: Every day | ORAL | 2 refills | Status: DC | PRN

## 2017-11-14 NOTE — Assessment & Plan Note (Signed)
Discussed depression care and treatment will start Effexor.  Avoiding citalopram because of potential drug interactions patient education given

## 2017-11-14 NOTE — Progress Notes (Signed)
BP 106/72   Pulse 89   Ht 5\' 2"  (1.575 m)   Wt 184 lb (83.5 kg)   SpO2 98%   BMI 33.65 kg/m    Subjective:    Patient ID: Natalie Gardner, female    DOB: 08-22-1945, 72 y.o.   MRN: [id number]  HPI: Natalie Gardner is a 72 y.o. female  Chief Complaint  Patient presents with  . Annual Exam    Needs monofilament done, and eye exam  Patient follow-up all in all doing well reviewed medications will take care of refills good report from Dr. Nehemiah Massed regarding heart condition. Blood pressure doing well no complaints.  No bleeding bruising issues from warfarin. Taking cholesterol medicine without problems. Reviewed hospital notes with mention of diabetes and elevated glucose we will check A1c today patient with no complaints from diabetes type symptoms. Also reviewed anemia patient has been taking iron will check CBC also today. Patient having episodes of days 3 to 4 days a week of just not been able to get off the sofa.  Tired fatigue no energy loss of interest in usual things feeling sad blue depressed.  No suicidal ideation.  Relevant past medical, surgical, family and social history reviewed and updated as indicated. Interim medical history since our last visit reviewed. Allergies and medications reviewed and updated.  Review of Systems  Constitutional: Negative.   HENT: Negative.   Eyes: Negative.   Respiratory: Negative.   Cardiovascular: Negative.   Gastrointestinal: Negative.   Endocrine: Negative.   Genitourinary: Negative.   Musculoskeletal: Negative.   Skin: Negative.   Allergic/Immunologic: Negative.   Neurological: Negative.   Hematological: Negative.   Psychiatric/Behavioral: Negative.     Per HPI unless specifically indicated above     Objective:    BP 106/72   Pulse 89   Ht 5\' 2"  (1.575 m)   Wt 184 lb (83.5 kg)   SpO2 98%   BMI 33.65 kg/m   Wt Readings from Last 3 Encounters:  11/14/17 184 lb (83.5 kg)  08/20/17 178 lb (80.7 kg)  06/21/17 180 lb  (81.6 kg)    Physical Exam  Constitutional: She is oriented to person, place, and time. She appears well-developed and well-nourished.  HENT:  Head: Normocephalic and atraumatic.  Right Ear: External ear normal.  Left Ear: External ear normal.  Nose: Nose normal.  Mouth/Throat: Oropharynx is clear and moist.  Eyes: Pupils are equal, round, and reactive to light. Conjunctivae and EOM are normal.  Neck: Normal range of motion. Neck supple. Carotid bruit is not present.  Cardiovascular: Normal rate, regular rhythm and normal heart sounds.  No murmur heard. Pulmonary/Chest: Effort normal and breath sounds normal. She exhibits no mass. Right breast exhibits no mass, no skin change and no tenderness. Left breast exhibits no mass, no skin change and no tenderness. Breasts are symmetrical.  Abdominal: Soft. Bowel sounds are normal. There is no hepatosplenomegaly.  Musculoskeletal: Normal range of motion.  Neurological: She is alert and oriented to person, place, and time.  Skin: No rash noted.  Psychiatric: She has a normal mood and affect. Her behavior is normal. Judgment and thought content normal.        Assessment & Plan:   Problem List Items Addressed This Visit      Cardiovascular and Mediastinum   Essential hypertension - Primary    The current medical regimen is effective;  continue present plan and medications.       Relevant Medications   lisinopril (  PRINIVIL,ZESTRIL) 40 MG tablet   hydrochlorothiazide (HYDRODIURIL) 25 MG tablet   furosemide (LASIX) 20 MG tablet   atorvastatin (LIPITOR) 10 MG tablet   Other Relevant Orders   CBC with Differential/Platelet   Comprehensive metabolic panel   Lipid panel   Bayer DCA Hb A1c Waived   Atrial fibrillation (HCC)    Followed by cardiology and stable      Relevant Medications   lisinopril (PRINIVIL,ZESTRIL) 40 MG tablet   hydrochlorothiazide (HYDRODIURIL) 25 MG tablet   furosemide (LASIX) 20 MG tablet   atorvastatin  (LIPITOR) 10 MG tablet     Endocrine   Diabetes mellitus without complication (HCC)    The current medical regimen is effective;  continue present plan and medications.       Relevant Medications   lisinopril (PRINIVIL,ZESTRIL) 40 MG tablet   atorvastatin (LIPITOR) 10 MG tablet   Other Relevant Orders   CBC with Differential/Platelet   Comprehensive metabolic panel   Lipid panel   Bayer DCA Hb A1c Waived     Other   Hyperlipidemia   Relevant Medications   lisinopril (PRINIVIL,ZESTRIL) 40 MG tablet   hydrochlorothiazide (HYDRODIURIL) 25 MG tablet   furosemide (LASIX) 20 MG tablet   atorvastatin (LIPITOR) 10 MG tablet   Other Relevant Orders   CBC with Differential/Platelet   Comprehensive metabolic panel   Lipid panel   Bayer DCA Hb A1c Waived   Advanced care planning/counseling discussion    A voluntary discussion about advanced care planning including explanation and discussion of advanced directives was extentively discussed with the patient.  Explained about the healthcare proxy and living will was reviewed and packet with forms with expiration of how to fill them out was given.  Time spent: Encounter 16+ min individuals present: Patient      Anemia, unspecified    The current medical regimen is effective;  continue present plan and medications.       Relevant Medications   ferrous sulfate 325 (65 FE) MG tablet   Other Relevant Orders   CBC with Differential/Platelet   Depression, major, single episode, moderate ([place])    Discussed depression care and treatment will start Effexor.  Avoiding citalopram because of potential drug interactions patient education given      Relevant Medications   traZODone (DESYREL) 50 MG tablet   venlafaxine XR (EFFEXOR XR) 37.5 MG 24 hr capsule   venlafaxine XR (EFFEXOR XR) 75 MG 24 hr capsule    Other Visit Diagnoses    Annual physical exam       Relevant Orders   CBC with Differential/Platelet   Comprehensive metabolic panel    Lipid panel   TSH   Urinalysis, Routine w reflex microscopic   Bayer DCA Hb A1c Waived   Hepatitis C antibody   MM DIGITAL SCREENING BILATERAL   Screening cholesterol level       Relevant Orders   Lipid panel   Thyroid disorder screen       Relevant Orders   TSH   Need for hepatitis C screening test       Relevant Orders   Hepatitis C antibody   Breast cancer screening       Relevant Orders   MM DIGITAL SCREENING BILATERAL       Follow up plan: Return in about 1 month (around 12/12/2017) for Recheck mood and nerves.Marland Kitchen

## 2017-11-14 NOTE — Assessment & Plan Note (Signed)
The current medical regimen is effective;  continue present plan and medications.  

## 2017-11-14 NOTE — Assessment & Plan Note (Signed)
Followed by cardiology and stable 

## 2017-11-14 NOTE — Assessment & Plan Note (Signed)
A voluntary discussion about advanced care planning including explanation and discussion of advanced directives was extentively discussed with the patient.  Explained about the healthcare proxy and living will was reviewed and packet with forms with expiration of how to fill them out was given.  Time spent: Encounter 16+ min individuals present: Patient 

## 2017-11-15 LAB — COMPREHENSIVE METABOLIC PANEL
ALK PHOS: 73 IU/L (ref 39–117)
ALT: 14 IU/L (ref 0–32)
AST: 13 IU/L (ref 0–40)
Albumin/Globulin Ratio: 1.2 (ref 1.2–2.2)
Albumin: 3.5 g/dL (ref 3.5–4.8)
BUN/Creatinine Ratio: 24 (ref 12–28)
BUN: 27 mg/dL (ref 8–27)
Bilirubin Total: 0.3 mg/dL (ref 0.0–1.2)
CALCIUM: 9.1 mg/dL (ref 8.7–10.3)
CO2: 20 mmol/L (ref 20–29)
CREATININE: 1.14 mg/dL — AB (ref 0.57–1.00)
Chloride: 106 mmol/L (ref 96–106)
GFR calc Af Amer: 56 mL/min/{1.73_m2} — ABNORMAL LOW (ref >59)
GFR, EST NON AFRICAN AMERICAN: 48 mL/min/{1.73_m2} — AB (ref >59)
GLUCOSE: 127 mg/dL — AB (ref 65–99)
Globulin, Total: 3 g/dL (ref 1.5–4.5)
Potassium: 5.2 mmol/L (ref 3.5–5.2)
SODIUM: 139 mmol/L (ref 134–144)
Total Protein: 6.5 g/dL (ref 6.0–8.5)

## 2017-11-15 LAB — LIPID PANEL
CHOLESTEROL TOTAL: 161 mg/dL (ref 100–199)
Chol/HDL Ratio: 3.2 ratio (ref 0.0–4.4)
HDL: 50 mg/dL (ref >39)
LDL CALC: 80 mg/dL (ref 0–99)
TRIGLYCERIDES: 153 mg/dL — AB (ref 0–149)
VLDL CHOLESTEROL CAL: 31 mg/dL (ref 5–40)

## 2017-11-15 LAB — CBC WITH DIFFERENTIAL/PLATELET
BASOS ABS: 0 10*3/uL (ref 0.0–0.2)
Basos: 0 %
EOS (ABSOLUTE): 0.2 10*3/uL (ref 0.0–0.4)
EOS: 3 %
Hematocrit: 34.8 % (ref 34.0–46.6)
Hemoglobin: 11.5 g/dL (ref 11.1–15.9)
Immature Grans (Abs): 0 10*3/uL (ref 0.0–0.1)
Immature Granulocytes: 0 %
LYMPHS ABS: 1.3 10*3/uL (ref 0.7–3.1)
Lymphs: 18 %
MCH: 31.4 pg (ref 26.6–33.0)
MCHC: 33 g/dL (ref 31.5–35.7)
MCV: 95 fL (ref 79–97)
MONOS ABS: 0.4 10*3/uL (ref 0.1–0.9)
Monocytes: 6 %
NEUTROS PCT: 73 %
Neutrophils Absolute: 5.4 10*3/uL (ref 1.4–7.0)
PLATELETS: 254 10*3/uL (ref 150–450)
RBC: 3.66 x10E6/uL — AB (ref 3.77–5.28)
RDW: 14.5 % (ref 12.3–15.4)
WBC: 7.4 10*3/uL (ref 3.4–10.8)

## 2017-11-15 LAB — HEPATITIS C ANTIBODY: Hep C Virus Ab: <0.1 s/co ratio (ref 0.0–0.9)

## 2017-11-15 LAB — TSH: TSH: 1.98 u[IU]/mL (ref 0.450–4.500)

## 2017-11-22 DIAGNOSIS — Z7901 Long term (current) use of anticoagulants: Secondary | ICD-10-CM | POA: Diagnosis not present

## 2017-12-12 DIAGNOSIS — Z1283 Encounter for screening for malignant neoplasm of skin: Secondary | ICD-10-CM | POA: Diagnosis not present

## 2017-12-12 DIAGNOSIS — L578 Other skin changes due to chronic exposure to nonionizing radiation: Secondary | ICD-10-CM | POA: Diagnosis not present

## 2017-12-12 DIAGNOSIS — L570 Actinic keratosis: Secondary | ICD-10-CM | POA: Diagnosis not present

## 2017-12-12 DIAGNOSIS — C4441 Basal cell carcinoma of skin of scalp and neck: Secondary | ICD-10-CM | POA: Diagnosis not present

## 2017-12-12 DIAGNOSIS — D485 Neoplasm of uncertain behavior of skin: Secondary | ICD-10-CM | POA: Diagnosis not present

## 2017-12-12 DIAGNOSIS — D2260 Melanocytic nevi of unspecified upper limb, including shoulder: Secondary | ICD-10-CM | POA: Diagnosis not present

## 2017-12-13 ENCOUNTER — Ambulatory Visit (INDEPENDENT_AMBULATORY_CARE_PROVIDER_SITE_OTHER): Payer: Medicare Other

## 2017-12-13 ENCOUNTER — Ambulatory Visit (INDEPENDENT_AMBULATORY_CARE_PROVIDER_SITE_OTHER): Payer: Medicare Other | Admitting: Family Medicine

## 2017-12-13 VITALS — BP 128/64 | HR 61 | Temp 97.5°F | Resp 16 | Ht 63.0 in | Wt 175.1 lb

## 2017-12-13 DIAGNOSIS — E119 Type 2 diabetes mellitus without complications: Secondary | ICD-10-CM | POA: Diagnosis not present

## 2017-12-13 DIAGNOSIS — D649 Anemia, unspecified: Secondary | ICD-10-CM | POA: Diagnosis not present

## 2017-12-13 DIAGNOSIS — Z0000 Encounter for general adult medical examination without abnormal findings: Secondary | ICD-10-CM | POA: Diagnosis not present

## 2017-12-13 DIAGNOSIS — F321 Major depressive disorder, single episode, moderate: Secondary | ICD-10-CM

## 2017-12-13 MED ORDER — VENLAFAXINE HCL ER 75 MG PO CP24: 75.0000 mg | ORAL_CAPSULE | Freq: Every day | ORAL | 3 refills | Status: DC

## 2017-12-13 NOTE — Assessment & Plan Note (Signed)
Reviewed last A1c and excellent will recheck 6 months from physical.

## 2017-12-13 NOTE — Assessment & Plan Note (Signed)
Anemia resolving with iron therapy patient will continue iron through the year.

## 2017-12-13 NOTE — Assessment & Plan Note (Signed)
Marked improvement of depression.  Will continue Effexor 75 once a day

## 2017-12-13 NOTE — Progress Notes (Addendum)
BP 128/64   Pulse 61   Ht 5\' 3"  (1.6 m)   Wt 175 lb (79.4 kg)   SpO2 98%   BMI 31.00 kg/m    Subjective:    Patient ID: Natalie Gardner, female    DOB: 01/28/1946, 72 y.o.   MRN: [id number]  HPI: Natalie Gardner is a 72 y.o. female  Chief Complaint  Patient presents with  . Medication Management   Patient recheck venlafaxine has been doing well with medication no side effects or issues noticed some improvement after a week or so.  Is sleeping well energy is better not as sad blue or tearful. Reports about 80% improved overall. Reviewed labs from blood work last month hemoglobin has returned back to normal for patient. Renal function has remained stable may be slight improvement. Reviewed other blood work all stable and normal.  Relevant past medical, surgical, family and social history reviewed and updated as indicated. Interim medical history since our last visit reviewed. Allergies and medications reviewed and updated.  Review of Systems  Constitutional: Negative.   Respiratory: Negative.   Cardiovascular: Negative.     Per HPI unless specifically indicated above     Objective:    BP 128/64   Pulse 61   Ht 5\' 3"  (1.6 m)   Wt 175 lb (79.4 kg)   SpO2 98%   BMI 31.00 kg/m   Wt Readings from Last 3 Encounters:  12/13/17 175 lb (79.4 kg)  12/13/17 175 lb 1.6 oz (79.4 kg)  11/14/17 184 lb (83.5 kg)    Physical Exam  Constitutional: She is oriented to person, place, and time. She appears well-developed and well-nourished.  HENT:  Head: Normocephalic and atraumatic.  Eyes: Conjunctivae and EOM are normal.  Neck: Normal range of motion.  Cardiovascular: Normal rate, regular rhythm and normal heart sounds.  Pulmonary/Chest: Effort normal and breath sounds normal.  Musculoskeletal: Normal range of motion.  Neurological: She is alert and oriented to person, place, and time.  Skin: No erythema.  Psychiatric: She has a normal mood and affect. Her behavior is  normal. Judgment and thought content normal.    Results for orders placed or performed in visit on 11/14/17  Microscopic Examination  Result Value Ref Range   WBC, UA 6-10 (A) 0 - 5 /hpf   RBC, UA 0-2 0 - 2 /hpf   Epithelial Cells (non renal) 0-10 0 - 10 /hpf   Bacteria, UA Few None seen/Few  CBC with Differential/Platelet  Result Value Ref Range   WBC 7.4 3.4 - 10.8 x10E3/uL   RBC 3.66 (L) 3.77 - 5.28 x10E6/uL   Hemoglobin 11.5 11.1 - 15.9 g/dL   Hematocrit 34.8 34.0 - 46.6 %   MCV 95 79 - 97 fL   MCH 31.4 26.6 - 33.0 pg   MCHC 33.0 31.5 - 35.7 g/dL   RDW 14.5 12.3 - 15.4 %   Platelets 254 150 - 450 x10E3/uL   Neutrophils 73 Not Estab. %   Lymphs 18 Not Estab. %   Monocytes 6 Not Estab. %   Eos 3 Not Estab. %   Basos 0 Not Estab. %   Neutrophils Absolute 5.4 1.4 - 7.0 x10E3/uL   Lymphocytes Absolute 1.3 0.7 - 3.1 x10E3/uL   Monocytes Absolute 0.4 0.1 - 0.9 x10E3/uL   EOS (ABSOLUTE) 0.2 0.0 - 0.4 x10E3/uL   Basophils Absolute 0.0 0.0 - 0.2 x10E3/uL   Immature Granulocytes 0 Not Estab. %   Immature Grans (Abs) 0.0  0.0 - 0.1 x10E3/uL  Comprehensive metabolic panel  Result Value Ref Range   Glucose 127 (H) 65 - 99 mg/dL   BUN 27 8 - 27 mg/dL   Creatinine, Ser 1.14 (H) 0.57 - 1.00 mg/dL   GFR calc non Af Amer 48 (L) >59 mL/min/1.73   GFR calc Af Amer 56 (L) >59 mL/min/1.73   BUN/Creatinine Ratio 24 12 - 28   Sodium 139 134 - 144 mmol/L   Potassium 5.2 3.5 - 5.2 mmol/L   Chloride 106 96 - 106 mmol/L   CO2 20 20 - 29 mmol/L   Calcium 9.1 8.7 - 10.3 mg/dL   Total Protein 6.5 6.0 - 8.5 g/dL   Albumin 3.5 3.5 - 4.8 g/dL   Globulin, Total 3.0 1.5 - 4.5 g/dL   Albumin/Globulin Ratio 1.2 1.2 - 2.2   Bilirubin Total 0.3 0.0 - 1.2 mg/dL   Alkaline Phosphatase 73 39 - 117 IU/L   AST 13 0 - 40 IU/L   ALT 14 0 - 32 IU/L  Lipid panel  Result Value Ref Range   Cholesterol, Total 161 100 - 199 mg/dL   Triglycerides 153 (H) 0 - 149 mg/dL   HDL 50 >39 mg/dL   VLDL Cholesterol Cal  31 5 - 40 mg/dL   LDL Calculated 80 0 - 99 mg/dL   Chol/HDL Ratio 3.2 0.0 - 4.4 ratio  TSH  Result Value Ref Range   TSH 1.980 0.450 - 4.500 uIU/mL  Urinalysis, Routine w reflex microscopic  Result Value Ref Range   Specific Gravity, UA 1.010 1.005 - 1.030   pH, UA 5.0 5.0 - 7.5   Color, UA Yellow Yellow   Appearance Ur Cloudy (A) Clear   Leukocytes, UA 1+ (A) Negative   Protein, UA Negative Negative/Trace   Glucose, UA Negative Negative   Ketones, UA Negative Negative   RBC, UA 1+ (A) Negative   Bilirubin, UA Negative Negative   Urobilinogen, Ur 0.2 0.2 - 1.0 mg/dL   Nitrite, UA Negative Negative   Microscopic Examination See below:   Bayer DCA Hb A1c Waived  Result Value Ref Range   HB A1C (BAYER DCA - WAIVED) 5.9 <7.0 %  Hepatitis C antibody  Result Value Ref Range   Hep C Virus Ab <0.1 0.0 - 0.9 s/co ratio      Assessment & Plan:   Problem List Items Addressed This Visit      Endocrine   Diabetes mellitus without complication ([place])    Reviewed last A1c and excellent will recheck 6 months from physical.        Other   Anemia, unspecified    Anemia resolving with iron therapy patient will continue iron through the year.      Depression, major, single episode, moderate ([facility])    Marked improvement of depression.  Will continue Effexor 75 once a day      Relevant Medications   venlafaxine XR (EFFEXOR XR) 75 MG 24 hr capsule       Follow up plan: Return in about 5 months (around 05/15/2018) for Hemoglobin A1c, BMP,  Lipids, ALT, AST.

## 2017-12-13 NOTE — Progress Notes (Signed)
Subjective:   Natalie Gardner is a 72 y.o. female who presents for Medicare Annual (Subsequent) preventive examination.  Review of Systems:   Cardiac Risk Factors include: advanced age (>18men, >102 women);hypertension;obesity (BMI >30kg/m2)     Objective:     Vitals: BP 128/64 (BP Location: Left Arm, Patient Position: Sitting)   Pulse 61   Temp (!) 97.5 F (36.4 C) (Temporal)   Resp 16   Ht 5\' 3"  (1.6 m)   Wt 175 lb 1.6 oz (79.4 kg)   BMI 31.02 kg/m   Body mass index is 31.02 kg/m.  Advanced Directives 12/13/2017 08/20/2017 06/21/2017 02/12/2017 01/31/2017 02/08/2015  Does Patient Have a Medical Advance Directive? Yes Yes Yes No No No  Type of Advance Directive Living will;Healthcare Power of [place];Living will - - -  Copy of [place] in Chart? No - copy requested - - - - -  Would patient like information on creating a medical advance directive? - - - Yes (MAU/Ambulatory/Procedural Areas - Information given) Yes (MAU/Ambulatory/Procedural Areas - Information given) -    Tobacco Social History   Tobacco Use  Smoking Status Never Smoker  Smokeless Tobacco Never Used     Counseling given: Not Answered   Clinical Intake:  Pre-visit preparation completed: Yes  Pain : No/denies pain Pain Score: 0-No pain     Nutritional Status: BMI > 30  Obese Nutritional Risks: None Diabetes: No  How often do you need to have someone help you when you read instructions, pamphlets, or other written materials from your doctor or pharmacy?: 1 - Never What is the last grade level you completed in school?: 12th grade  Interpreter Needed?: No  Information entered by :: Lunabelle Oatley,LPN   Past Medical History:  Diagnosis Date  . Anemia   . Aortic valve regurgitation   . Arthritis   . Atrial fibrillation ([place])   . Cancer ([place]) 12/2016   Basal Cell Skin Cancer  . Cardiomyopathy ([place])   . CHF (congestive heart failure) ([place])   .  CKD (chronic kidney disease)   . Colitis   . Diverticulosis   . Dyspnea   . Dysrhythmia   . GERD (gastroesophageal reflux disease)   . GI bleed   . Heart murmur   . History of hiatal hernia   . Hypercholesterolemia   . Hyperlipidemia   . Hypertension   . Insomnia   . Osteopenia   . Pre-diabetes   . Urinary, incontinence, stress female   . Vertigo    Past Surgical History:  Procedure Laterality Date  . ANKLE SURGERY Right 1997   Fractured ankle  . COLONOSCOPY    . COLONOSCOPY WITH PROPOFOL N/A 02/08/2015   Procedure: COLONOSCOPY WITH PROPOFOL;  Surgeon: Manya Silvas, MD;  Location: [facility] ENDOSCOPY;  Service: Endoscopy;  Laterality: N/A;  . COLONOSCOPY WITH PROPOFOL N/A 08/20/2017   Procedure: COLONOSCOPY WITH PROPOFOL;  Surgeon: Manya Silvas, MD;  Location: [facility] ENDOSCOPY;  Service: Endoscopy;  Laterality: N/A;  . ESOPHAGOGASTRODUODENOSCOPY (EGD) WITH PROPOFOL N/A 08/20/2017   Procedure: ESOPHAGOGASTRODUODENOSCOPY (EGD) WITH PROPOFOL;  Surgeon: Manya Silvas, MD;  Location: [facility] ENDOSCOPY;  Service: Endoscopy;  Laterality: N/A;  . KNEE ARTHROPLASTY Left 02/12/2017   Procedure: COMPUTER ASSISTED TOTAL KNEE ARTHROPLASTY;  Surgeon: Dereck Leep, MD;  Location: [facility] ORS;  Service: Orthopedics;  Laterality: Left;  . RADIAL HEAD ARTHROPLASTY Left 06/21/2017   Procedure: RADIAL HEAD ARTHROPLASTY;  Surgeon: Corky Mull, MD;  Location: [facility] ORS;  Service: Orthopedics;  Laterality: Left;  . SACROCOCCYGEAL ULCER REMOVAL    . TONSILLECTOMY    . TOTAL KNEE ARTHROPLASTY Left    Family History  Problem Relation Age of Onset  . Heart disease Mother   . Diabetes Mother   . Heart disease Father   . Parkinson's disease Father   . Diabetes Brother   . Cancer Brother        Colon   Social History   Socioeconomic History  . Marital status: Married    Spouse name: Not on file  . Number of children: Not on file  . Years of education: Not on file  . Highest education level:  12th grade  Occupational History  . Not on file  Social Needs  . Financial resource strain: Not hard at all  . Food insecurity:    Worry: Never true    Inability: Never true  . Transportation needs:    Medical: No    Non-medical: No  Tobacco Use  . Smoking status: Never Smoker  . Smokeless tobacco: Never Used  Substance and Sexual Activity  . Alcohol use: No  . Drug use: No  . Sexual activity: Not on file  Lifestyle  . Physical activity:    Days per week: 0 days    Minutes per session: 0 min  . Stress: Not at all  Relationships  . Social connections:    Talks on phone: More than three times a week    Gets together: More than three times a week    Attends religious service: More than 4 times per year    Active member of club or organization: No    Attends meetings of clubs or organizations: Never    Relationship status: Married  Other Topics Concern  . Not on file  Social History Narrative  . Not on file    Outpatient Encounter Medications as of 12/13/2017  Medication Sig  . acetaminophen (TYLENOL) 650 MG CR tablet Take 1,300 mg by mouth at bedtime.  Marland Kitchen atorvastatin (LIPITOR) 10 MG tablet TAKE 1 TABLET BY MOUTH AT BEDTIME FOR CHOLESTEROL  . diltiazem (DILACOR XR) 180 MG 24 hr capsule Take 180 mg by mouth daily.  . diphenhydrAMINE (BENADRYL) 25 MG tablet Take 25 mg by mouth 2 (two) times daily as needed for allergies.  . ferrous sulfate 325 (65 FE) MG tablet Take 1 tablet (325 mg total) by mouth 2 (two) times daily with a meal.  . furosemide (LASIX) 20 MG tablet Take 1 tablet (20 mg total) by mouth daily as needed for edema.  . hydrochlorothiazide (HYDRODIURIL) 25 MG tablet Take 1 tablet (25 mg total) by mouth daily.  Marland Kitchen lisinopril (PRINIVIL,ZESTRIL) 40 MG tablet Take 1 tablet (40 mg total) by mouth daily.  . metoprolol succinate (TOPROL-XL) 50 MG 24 hr tablet Take 50 mg by mouth every evening.   . Multiple Vitamin (MULTI-VITAMINS) TABS Take 1 tablet by mouth daily.  .  ranitidine (ZANTAC) 150 MG tablet Take 150 mg by mouth daily.  Marland Kitchen spironolactone (ALDACTONE) 25 MG tablet Take 25 mg by mouth every evening.   . sucralfate (CARAFATE) 1 G tablet Take 1 g by mouth 2 (two) times daily.   . traZODone (DESYREL) 50 MG tablet Take 0.5-1 tablets (25-50 mg total) by mouth at bedtime as needed for sleep.  Marland Kitchen venlafaxine XR (EFFEXOR XR) 75 MG 24 hr capsule Take 1 capsule (75 mg total) by mouth daily with breakfast.  . warfarin (  COUMADIN) 2.5 MG tablet Take 2.5 mg every other day with the 5 mg tablet  . warfarin (COUMADIN) 5 MG tablet Take 5 mg by mouth daily.  . Omega-3 1000 MG CAPS Take 1,000 mg by mouth daily.   Marland Kitchen venlafaxine XR (EFFEXOR XR) 37.5 MG 24 hr capsule Take 1 capsule (37.5 mg total) by mouth daily with breakfast. (Patient not taking: Reported on 12/13/2017)   No facility-administered encounter medications on file as of 12/13/2017.     Activities of Daily Living In your present state of health, do you have any difficulty performing the following activities: 12/13/2017 02/12/2017  Hearing? N N  Vision? N N  Difficulty concentrating or making decisions? N N  Walking or climbing stairs? N Y  Dressing or bathing? N N  Doing errands, shopping? N N  Preparing Food and eating ? N -  Using the Toilet? N -  In the past six months, have you accidently leaked urine? Y -  Comment wears pads for protection  -  Do you have problems with loss of bowel control? N -  Managing your Medications? N -  Managing your Finances? N -  Housekeeping or managing your Housekeeping? N -  Some recent data might be hidden    Patient Care Team: Guadalupe Maple, MD as PCP - General (Family Medicine)    Assessment:   This is a routine wellness examination for [place].  Exercise Activities and Dietary recommendations Current Exercise Habits: Home exercise routine, Type of exercise: walking, Time (Minutes): 30, Frequency (Times/Week): 3, Weekly Exercise (Minutes/Week): 90, Intensity:  Mild, Exercise limited by: None identified  Goals    . DIET - INCREASE WATER INTAKE     recommend drinking at least 6-8 glasses of water a day        Fall Risk Fall Risk  12/13/2017 11/14/2017 05/14/2017 04/10/2017 09/28/2016  Falls in the past year? No No No No No   Is the patient's home free of loose throw rugs in walkways, pet beds, electrical cords, etc?   yes      Grab bars in the bathroom? no      Handrails on the stairs?   no stairs       Adequate lighting?   yes  Timed Get Up and Go performed: Completed in 8 seconds with no use of assistive devices, steady gait. No intervention needed at this time.   Depression Screen PHQ 2/9 Scores 12/13/2017 11/14/2017 05/14/2017 04/10/2017  PHQ - 2 Score 0 0 0 0     Cognitive Function     6CIT Screen 12/13/2017  What Year? 0 points  What month? 0 points  What time? 0 points  Count back from 20 0 points  Months in reverse 0 points  Repeat phrase 2 points  Total Score 2    Immunization History  Administered Date(s) Administered  . Influenza, High Dose Seasonal PF 03/31/2016, 02/13/2017  . Influenza,inj,Quad PF,6+ Mos 03/16/2015  . Influenza-Unspecified 02/18/2014  . Pneumococcal Conjugate-13 09/13/2015  . Pneumococcal-Unspecified 05/26/2014  . Tdap 10/26/2010  . Zoster 02/20/2008    Qualifies for Shingles Vaccine? Yes, discussed shingrix vaccine   Screening Tests Health Maintenance  Topic Date Due  . FOOT EXAM  05/19/1956  . OPHTHALMOLOGY EXAM  05/19/1956  . MAMMOGRAM  09/29/2017  . INFLUENZA VACCINE  12/27/2017  . HEMOGLOBIN A1C  05/16/2018  . TETANUS/TDAP  10/25/2020  . COLONOSCOPY  08/21/2027  . DEXA SCAN  Completed  . Hepatitis C Screening  Completed  .  PNA vac Low Risk Adult  Completed    Cancer Screenings: Lung: Low Dose CT Chest recommended if Age 74-80 years, 30 pack-year currently smoking OR have quit w/in 15years. Patient does not qualify. Breast:  Up to date on Mammogram? No  ordered Up to date of Bone  Density/Dexa? Yes 09/20/2009 Colorectal: completed 08/20/2017  Additional Screenings:  Hepatitis C Screening: completed 11/14/2017     Plan:    I have personally reviewed and addressed the Medicare Annual Wellness questionnaire and have noted the following in the patient's chart:  A. Medical and social history B. Use of alcohol, tobacco or illicit drugs  C. Current medications and supplements D. Functional ability and status E.  Nutritional status F.  Physical activity G. Advance directives H. List of other physicians I.  Hospitalizations, surgeries, and ER visits in previous 12 months J.  [place] such as hearing and vision if needed, cognitive and depression L. Referrals and appointments   In addition, I have reviewed and discussed with patient certain preventive protocols, quality metrics, and best practice recommendations. A written personalized care plan for preventive services as well as general preventive health recommendations were provided to patient.   Signed,  Tyler Aas, LPN Nurse Health Advisor   Nurse Notes:none

## 2017-12-13 NOTE — Patient Instructions (Addendum)
[name] , Thank you for taking time to come for your Medicare Wellness Visit. I appreciate your ongoing commitment to your health goals. Please review the following plan we discussed and let me know if I can assist you in the future.   Screening recommendations/referrals: Colonoscopy: completed 08/20/2017 Mammogram: Please call [phone] to schedule your mammogram.  Bone Density: Please call [phone] to schedule. Recommended yearly ophthalmology/optometry visit for glaucoma screening and checkup Recommended yearly dental visit for hygiene and checkup  Vaccinations: Influenza vaccine:  Due 01/2018 Pneumococcal vaccine: completed series Tdap vaccine: up to date  Shingles vaccine: shingrix eligible, check with your insurance company for coverage   Advanced directives: Please bring a copy of your health care power of attorney and living will to the office at your convenience.  Conditions/risks identified: recommend drinking at least 6-8 glasses of water a day   Next appointment: Follow up in one year for your annual wellness exam.    Preventive Care 65 Years and Older, Female Preventive care refers to lifestyle choices and visits with your health care provider that can promote health and wellness. What does preventive care include?  A yearly physical exam. This is also called an annual well check.  Dental exams once or twice a year.  Routine eye exams. Ask your health care provider how often you should have your eyes checked.  Personal lifestyle choices, including:  Daily care of your teeth and gums.  Regular physical activity.  Eating a healthy diet.  Avoiding tobacco and drug use.  Limiting alcohol use.  Practicing safe sex.  Taking low-dose aspirin every day.  Taking vitamin and mineral supplements as recommended by your health care provider. What happens during an annual well check? The services and screenings done by your health care provider during your  annual well check will depend on your age, overall health, lifestyle risk factors, and family history of disease. Counseling  Your health care provider may ask you questions about your:  Alcohol use.  Tobacco use.  Drug use.  Emotional well-being.  Home and relationship well-being.  Sexual activity.  Eating habits.  History of falls.  Memory and ability to understand (cognition).  Work and work Statistician.  Reproductive health. Screening  You may have the following tests or measurements:  Height, weight, and BMI.  Blood pressure.  Lipid and cholesterol levels. These may be checked every 5 years, or more frequently if you are over 94 years old.  Skin check.  Lung cancer screening. You may have this screening every year starting at age 41 if you have a 30-pack-year history of smoking and currently smoke or have quit within the past 15 years.  Fecal occult blood test (FOBT) of the stool. You may have this test every year starting at age 73.  Flexible sigmoidoscopy or colonoscopy. You may have a sigmoidoscopy every 5 years or a colonoscopy every 10 years starting at age 22.  Hepatitis C blood test.  Hepatitis B blood test.  Sexually transmitted disease (STD) testing.  Diabetes screening. This is done by checking your blood sugar (glucose) after you have not eaten for a while (fasting). You may have this done every 1-3 years.  Bone density scan. This is done to screen for osteoporosis. You may have this done starting at age 61.  Mammogram. This may be done every 1-2 years. Talk to your health care provider about how often you should have regular mammograms. Talk with your health care provider about your test results, treatment  options, and if necessary, the need for more tests. Vaccines  Your health care provider may recommend certain vaccines, such as:  Influenza vaccine. This is recommended every year.  Tetanus, diphtheria, and acellular pertussis (Tdap, Td)  vaccine. You may need a Td booster every 10 years.  Zoster vaccine. You may need this after age 29.  Pneumococcal 13-valent conjugate (PCV13) vaccine. One dose is recommended after age 84.  Pneumococcal polysaccharide (PPSV23) vaccine. One dose is recommended after age 23. Talk to your health care provider about which screenings and vaccines you need and how often you need them. This information is not intended to replace advice given to you by your health care provider. Make sure you discuss any questions you have with your health care provider. Document Released: 06/11/2015 Document Revised: 02/02/2016 Document Reviewed: 03/16/2015 Elsevier Interactive Patient Education  2017 [place] Prevention in the Home Falls can cause injuries. They can happen to people of all ages. There are many things you can do to make your home safe and to help prevent falls. What can I do on the outside of my home?  Regularly fix the edges of walkways and driveways and fix any cracks.  Remove anything that might make you trip as you walk through a door, such as a raised step or threshold.  Trim any bushes or trees on the path to your home.  Use bright outdoor lighting.  Clear any walking paths of anything that might make someone trip, such as rocks or tools.  Regularly check to see if handrails are loose or broken. Make sure that both sides of any steps have handrails.  Any raised decks and porches should have guardrails on the edges.  Have any leaves, snow, or ice cleared regularly.  Use sand or salt on walking paths during winter.  Clean up any spills in your garage right away. This includes oil or grease spills. What can I do in the bathroom?  Use night lights.  Install grab bars by the toilet and in the tub and shower. Do not use towel bars as grab bars.  Use non-skid mats or decals in the tub or shower.  If you need to sit down in the shower, use a plastic, non-slip  stool.  Keep the floor dry. Clean up any water that spills on the floor as soon as it happens.  Remove soap buildup in the tub or shower regularly.  Attach bath mats securely with double-sided non-slip rug tape.  Do not have throw rugs and other things on the floor that can make you trip. What can I do in the bedroom?  Use night lights.  Make sure that you have a light by your bed that is easy to reach.  Do not use any sheets or blankets that are too big for your bed. They should not hang down onto the floor.  Have a firm chair that has side arms. You can use this for support while you get dressed.  Do not have throw rugs and other things on the floor that can make you trip. What can I do in the kitchen?  Clean up any spills right away.  Avoid walking on wet floors.  Keep items that you use a lot in easy-to-reach places.  If you need to reach something above you, use a strong step stool that has a grab bar.  Keep electrical cords out of the way.  Do not use floor polish or wax that makes floors slippery.  If you must use wax, use non-skid floor wax.  Do not have throw rugs and other things on the floor that can make you trip. What can I do with my stairs?  Do not leave any items on the stairs.  Make sure that there are handrails on both sides of the stairs and use them. Fix handrails that are broken or loose. Make sure that handrails are as long as the stairways.  Check any carpeting to make sure that it is firmly attached to the stairs. Fix any carpet that is loose or worn.  Avoid having throw rugs at the top or bottom of the stairs. If you do have throw rugs, attach them to the floor with carpet tape.  Make sure that you have a light switch at the top of the stairs and the bottom of the stairs. If you do not have them, ask someone to add them for you. What else can I do to help prevent falls?  Wear shoes that:  Do not have high heels.  Have rubber bottoms.  Are  comfortable and fit you well.  Are closed at the toe. Do not wear sandals.  If you use a stepladder:  Make sure that it is fully opened. Do not climb a closed stepladder.  Make sure that both sides of the stepladder are locked into place.  Ask someone to hold it for you, if possible.  Clearly mark and make sure that you can see:  Any grab bars or handrails.  First and last steps.  Where the edge of each step is.  Use tools that help you move around (mobility aids) if they are needed. These include:  Canes.  Walkers.  Scooters.  Crutches.  Turn on the lights when you go into a dark area. Replace any light bulbs as soon as they burn out.  Set up your furniture so you have a clear path. Avoid moving your furniture around.  If any of your floors are uneven, fix them.  If there are any pets around you, be aware of where they are.  Review your medicines with your doctor. Some medicines can make you feel dizzy. This can increase your chance of falling. Ask your doctor what other things that you can do to help prevent falls. This information is not intended to replace advice given to you by your health care provider. Make sure you discuss any questions you have with your health care provider. Document Released: 03/11/2009 Document Revised: 10/21/2015 Document Reviewed: 06/19/2014 Elsevier Interactive Patient Education  2017 [employer].

## 2017-12-17 DIAGNOSIS — K210 Gastro-esophageal reflux disease with esophagitis: Secondary | ICD-10-CM | POA: Diagnosis not present

## 2017-12-17 DIAGNOSIS — Z86010 Personal history of colonic polyps: Secondary | ICD-10-CM | POA: Diagnosis not present

## 2017-12-17 DIAGNOSIS — D509 Iron deficiency anemia, unspecified: Secondary | ICD-10-CM | POA: Diagnosis not present

## 2018-01-15 DIAGNOSIS — C4441 Basal cell carcinoma of skin of scalp and neck: Secondary | ICD-10-CM | POA: Diagnosis not present

## 2018-02-19 DIAGNOSIS — Z96652 Presence of left artificial knee joint: Secondary | ICD-10-CM | POA: Diagnosis not present

## 2018-03-05 DIAGNOSIS — Z7901 Long term (current) use of anticoagulants: Secondary | ICD-10-CM | POA: Diagnosis not present

## 2018-04-23 ENCOUNTER — Other Ambulatory Visit: Payer: Self-pay | Admitting: Family Medicine

## 2018-04-23 NOTE — Telephone Encounter (Signed)
Courtesy refill  

## 2018-05-06 ENCOUNTER — Ambulatory Visit: Payer: Medicare Other | Admitting: Family Medicine

## 2018-05-08 DIAGNOSIS — Z7901 Long term (current) use of anticoagulants: Secondary | ICD-10-CM | POA: Diagnosis not present

## 2018-05-20 DIAGNOSIS — I482 Chronic atrial fibrillation, unspecified: Secondary | ICD-10-CM | POA: Diagnosis not present

## 2018-05-20 DIAGNOSIS — E782 Mixed hyperlipidemia: Secondary | ICD-10-CM | POA: Diagnosis not present

## 2018-05-20 DIAGNOSIS — E119 Type 2 diabetes mellitus without complications: Secondary | ICD-10-CM | POA: Diagnosis not present

## 2018-05-20 DIAGNOSIS — R001 Bradycardia, unspecified: Secondary | ICD-10-CM | POA: Diagnosis not present

## 2018-05-20 DIAGNOSIS — I10 Essential (primary) hypertension: Secondary | ICD-10-CM | POA: Diagnosis not present

## 2018-06-08 ENCOUNTER — Emergency Department: Payer: Medicare Other

## 2018-06-08 ENCOUNTER — Inpatient Hospital Stay
Admission: EM | Admit: 2018-06-08 | Discharge: 2018-06-11 | DRG: 481 | Disposition: A | Discharge status: Skilled Nursing Facility | Payer: Medicare Other | Attending: Specialist | Admitting: Specialist

## 2018-06-08 ENCOUNTER — Other Ambulatory Visit: Payer: Self-pay

## 2018-06-08 DIAGNOSIS — Z833 Family history of diabetes mellitus: Secondary | ICD-10-CM

## 2018-06-08 DIAGNOSIS — Z880 Allergy status to penicillin: Secondary | ICD-10-CM | POA: Diagnosis not present

## 2018-06-08 DIAGNOSIS — K449 Diaphragmatic hernia without obstruction or gangrene: Secondary | ICD-10-CM | POA: Diagnosis present

## 2018-06-08 DIAGNOSIS — S72142A Displaced intertrochanteric fracture of left femur, initial encounter for closed fracture: Secondary | ICD-10-CM | POA: Diagnosis not present

## 2018-06-08 DIAGNOSIS — Z96652 Presence of left artificial knee joint: Secondary | ICD-10-CM | POA: Diagnosis present

## 2018-06-08 DIAGNOSIS — N183 Chronic kidney disease, stage 3 (moderate): Secondary | ICD-10-CM | POA: Diagnosis present

## 2018-06-08 DIAGNOSIS — Z888 Allergy status to other drugs, medicaments and biological substances status: Secondary | ICD-10-CM

## 2018-06-08 DIAGNOSIS — N189 Chronic kidney disease, unspecified: Secondary | ICD-10-CM | POA: Diagnosis not present

## 2018-06-08 DIAGNOSIS — W010XXA Fall on same level from slipping, tripping and stumbling without subsequent striking against object, initial encounter: Secondary | ICD-10-CM | POA: Diagnosis present

## 2018-06-08 DIAGNOSIS — Z8249 Family history of ischemic heart disease and other diseases of the circulatory system: Secondary | ICD-10-CM

## 2018-06-08 DIAGNOSIS — Z882 Allergy status to sulfonamides status: Secondary | ICD-10-CM | POA: Diagnosis not present

## 2018-06-08 DIAGNOSIS — E1122 Type 2 diabetes mellitus with diabetic chronic kidney disease: Secondary | ICD-10-CM | POA: Diagnosis present

## 2018-06-08 DIAGNOSIS — Z85828 Personal history of other malignant neoplasm of skin: Secondary | ICD-10-CM | POA: Diagnosis not present

## 2018-06-08 DIAGNOSIS — D631 Anemia in chronic kidney disease: Secondary | ICD-10-CM | POA: Diagnosis present

## 2018-06-08 DIAGNOSIS — I428 Other cardiomyopathies: Secondary | ICD-10-CM | POA: Diagnosis not present

## 2018-06-08 DIAGNOSIS — F329 Major depressive disorder, single episode, unspecified: Secondary | ICD-10-CM | POA: Diagnosis present

## 2018-06-08 DIAGNOSIS — R001 Bradycardia, unspecified: Secondary | ICD-10-CM | POA: Diagnosis present

## 2018-06-08 DIAGNOSIS — S72145A Nondisplaced intertrochanteric fracture of left femur, initial encounter for closed fracture: Secondary | ICD-10-CM | POA: Diagnosis not present

## 2018-06-08 DIAGNOSIS — I482 Chronic atrial fibrillation, unspecified: Secondary | ICD-10-CM | POA: Diagnosis not present

## 2018-06-08 DIAGNOSIS — D62 Acute posthemorrhagic anemia: Secondary | ICD-10-CM | POA: Diagnosis not present

## 2018-06-08 DIAGNOSIS — Z5181 Encounter for therapeutic drug level monitoring: Secondary | ICD-10-CM | POA: Diagnosis not present

## 2018-06-08 DIAGNOSIS — Z7901 Long term (current) use of anticoagulants: Secondary | ICD-10-CM | POA: Diagnosis not present

## 2018-06-08 DIAGNOSIS — E785 Hyperlipidemia, unspecified: Secondary | ICD-10-CM | POA: Diagnosis not present

## 2018-06-08 DIAGNOSIS — S72002A Fracture of unspecified part of neck of left femur, initial encounter for closed fracture: Secondary | ICD-10-CM | POA: Diagnosis not present

## 2018-06-08 DIAGNOSIS — I5032 Chronic diastolic (congestive) heart failure: Secondary | ICD-10-CM | POA: Diagnosis not present

## 2018-06-08 DIAGNOSIS — M25552 Pain in left hip: Secondary | ICD-10-CM | POA: Diagnosis not present

## 2018-06-08 DIAGNOSIS — E1169 Type 2 diabetes mellitus with other specified complication: Secondary | ICD-10-CM | POA: Diagnosis not present

## 2018-06-08 DIAGNOSIS — W19XXXA Unspecified fall, initial encounter: Secondary | ICD-10-CM

## 2018-06-08 DIAGNOSIS — Z9889 Other specified postprocedural states: Secondary | ICD-10-CM

## 2018-06-08 DIAGNOSIS — I429 Cardiomyopathy, unspecified: Secondary | ICD-10-CM | POA: Diagnosis not present

## 2018-06-08 DIAGNOSIS — Z79899 Other long term (current) drug therapy: Secondary | ICD-10-CM | POA: Diagnosis not present

## 2018-06-08 DIAGNOSIS — Z79891 Long term (current) use of opiate analgesic: Secondary | ICD-10-CM | POA: Diagnosis not present

## 2018-06-08 DIAGNOSIS — G4700 Insomnia, unspecified: Secondary | ICD-10-CM | POA: Diagnosis not present

## 2018-06-08 DIAGNOSIS — I5022 Chronic systolic (congestive) heart failure: Secondary | ICD-10-CM | POA: Diagnosis not present

## 2018-06-08 DIAGNOSIS — E1159 Type 2 diabetes mellitus with other circulatory complications: Secondary | ICD-10-CM | POA: Diagnosis not present

## 2018-06-08 DIAGNOSIS — I130 Hypertensive heart and chronic kidney disease with heart failure and stage 1 through stage 4 chronic kidney disease, or unspecified chronic kidney disease: Secondary | ICD-10-CM | POA: Diagnosis not present

## 2018-06-08 DIAGNOSIS — S299XXA Unspecified injury of thorax, initial encounter: Secondary | ICD-10-CM | POA: Diagnosis not present

## 2018-06-08 DIAGNOSIS — Z8781 Personal history of (healed) traumatic fracture: Secondary | ICD-10-CM

## 2018-06-08 DIAGNOSIS — I4891 Unspecified atrial fibrillation: Secondary | ICD-10-CM | POA: Diagnosis not present

## 2018-06-08 DIAGNOSIS — K219 Gastro-esophageal reflux disease without esophagitis: Secondary | ICD-10-CM | POA: Diagnosis present

## 2018-06-08 DIAGNOSIS — D509 Iron deficiency anemia, unspecified: Secondary | ICD-10-CM | POA: Diagnosis not present

## 2018-06-08 DIAGNOSIS — M810 Age-related osteoporosis without current pathological fracture: Secondary | ICD-10-CM | POA: Diagnosis not present

## 2018-06-08 DIAGNOSIS — E782 Mixed hyperlipidemia: Secondary | ICD-10-CM | POA: Diagnosis not present

## 2018-06-08 DIAGNOSIS — S72142D Displaced intertrochanteric fracture of left femur, subsequent encounter for closed fracture with routine healing: Secondary | ICD-10-CM | POA: Diagnosis not present

## 2018-06-08 DIAGNOSIS — I959 Hypotension, unspecified: Secondary | ICD-10-CM | POA: Diagnosis not present

## 2018-06-08 DIAGNOSIS — Z9181 History of falling: Secondary | ICD-10-CM | POA: Diagnosis not present

## 2018-06-08 DIAGNOSIS — I129 Hypertensive chronic kidney disease with stage 1 through stage 4 chronic kidney disease, or unspecified chronic kidney disease: Secondary | ICD-10-CM | POA: Diagnosis not present

## 2018-06-08 DIAGNOSIS — M6281 Muscle weakness (generalized): Secondary | ICD-10-CM | POA: Diagnosis not present

## 2018-06-08 DIAGNOSIS — I10 Essential (primary) hypertension: Secondary | ICD-10-CM | POA: Diagnosis not present

## 2018-06-08 DIAGNOSIS — R000 Tachycardia, unspecified: Secondary | ICD-10-CM | POA: Diagnosis not present

## 2018-06-08 LAB — TYPE AND SCREEN
ABO/RH(D): A NEG
Antibody Screen: NEGATIVE

## 2018-06-08 LAB — BASIC METABOLIC PANEL
Anion gap: 9 (ref 5–15)
BUN: 36 mg/dL — ABNORMAL HIGH (ref 8–23)
CHLORIDE: 107 mmol/L (ref 98–111)
CO2: 21 mmol/L — ABNORMAL LOW (ref 22–32)
Calcium: 9.1 mg/dL (ref 8.9–10.3)
Creatinine, Ser: 1.28 mg/dL — ABNORMAL HIGH (ref 0.44–1.00)
GFR calc Af Amer: 48 mL/min — ABNORMAL LOW (ref >60)
GFR calc non Af Amer: 41 mL/min — ABNORMAL LOW (ref >60)
Glucose, Bld: 135 mg/dL — ABNORMAL HIGH (ref 70–99)
Potassium: 4.9 mmol/L (ref 3.5–5.1)
Sodium: 137 mmol/L (ref 135–145)

## 2018-06-08 LAB — PROTIME-INR
INR: 2.58
Prothrombin Time: 27.3 seconds — ABNORMAL HIGH (ref 11.4–15.2)

## 2018-06-08 LAB — CBC
HEMATOCRIT: 38.6 % (ref 36.0–46.0)
HEMOGLOBIN: 12.6 g/dL (ref 12.0–15.0)
MCH: 32.6 pg (ref 26.0–34.0)
MCHC: 32.6 g/dL (ref 30.0–36.0)
MCV: 100 fL (ref 80.0–100.0)
Platelets: 277 10*3/uL (ref 150–400)
RBC: 3.86 MIL/uL — ABNORMAL LOW (ref 3.87–5.11)
RDW: 12.9 % (ref 11.5–15.5)
WBC: 9.1 10*3/uL (ref 4.0–10.5)
nRBC: 0 % (ref 0.0–0.2)

## 2018-06-08 MED ORDER — ADULT MULTIVITAMIN W/MINERALS CH
1.0000 | ORAL_TABLET | Freq: Every day | ORAL | Status: DC
  Administered 2018-06-08 – 2018-06-11 (×3): 1 via ORAL
  Filled 2018-06-08 (×3): qty 1

## 2018-06-08 MED ORDER — MORPHINE SULFATE (PF) 2 MG/ML IV SOLN
2.0000 mg | INTRAVENOUS | Status: DC | PRN
  Administered 2018-06-08 (×2): 2 mg via INTRAVENOUS
  Filled 2018-06-08 (×2): qty 1

## 2018-06-08 MED ORDER — SUCRALFATE 1 G PO TABS
1.0000 g | ORAL_TABLET | Freq: Two times a day (BID) | ORAL | Status: DC
  Administered 2018-06-08 – 2018-06-11 (×6): 1 g via ORAL
  Filled 2018-06-08 (×6): qty 1

## 2018-06-08 MED ORDER — ACETAMINOPHEN 650 MG RE SUPP: 650.0000 mg | Freq: Four times a day (QID) | RECTAL | Status: DC | PRN

## 2018-06-08 MED ORDER — FAMOTIDINE 20 MG PO TABS
20.0000 mg | ORAL_TABLET | Freq: Every day | ORAL | Status: DC
  Administered 2018-06-08 – 2018-06-11 (×3): 20 mg via ORAL
  Filled 2018-06-08 (×3): qty 1

## 2018-06-08 MED ORDER — DOCUSATE SODIUM 100 MG PO CAPS
100.0000 mg | ORAL_CAPSULE | Freq: Two times a day (BID) | ORAL | Status: DC
  Administered 2018-06-08 (×2): 100 mg via ORAL
  Filled 2018-06-08 (×2): qty 1

## 2018-06-08 MED ORDER — VENLAFAXINE HCL ER 75 MG PO CP24
75.0000 mg | ORAL_CAPSULE | Freq: Every day | ORAL | Status: DC
  Administered 2018-06-10 – 2018-06-11 (×2): 75 mg via ORAL
  Filled 2018-06-08 (×3): qty 1

## 2018-06-08 MED ORDER — FERROUS SULFATE 325 (65 FE) MG PO TABS
325.0000 mg | ORAL_TABLET | Freq: Two times a day (BID) | ORAL | Status: DC
  Administered 2018-06-08 – 2018-06-11 (×5): 325 mg via ORAL
  Filled 2018-06-08 (×5): qty 1

## 2018-06-08 MED ORDER — ONDANSETRON HCL 4 MG PO TABS: 4.0000 mg | ORAL_TABLET | Freq: Four times a day (QID) | ORAL | Status: DC | PRN

## 2018-06-08 MED ORDER — LISINOPRIL 20 MG PO TABS
40.0000 mg | ORAL_TABLET | Freq: Every day | ORAL | Status: DC
  Filled 2018-06-08: qty 2

## 2018-06-08 MED ORDER — VITAMIN K1 10 MG/ML IJ SOLN
10.0000 mg | Freq: Once | INTRAVENOUS | Status: AC
  Administered 2018-06-08: 10 mg via INTRAVENOUS
  Filled 2018-06-08: qty 1

## 2018-06-08 MED ORDER — DILTIAZEM HCL ER COATED BEADS 180 MG PO TB24
180.0000 mg | ORAL_TABLET | Freq: Every day | ORAL | Status: DC
  Administered 2018-06-08 – 2018-06-11 (×4): 180 mg via ORAL
  Filled 2018-06-08 (×4): qty 1

## 2018-06-08 MED ORDER — TRAZODONE HCL 50 MG PO TABS: 25.0000 mg | ORAL_TABLET | Freq: Every evening | ORAL | Status: DC | PRN

## 2018-06-08 MED ORDER — SODIUM CHLORIDE 0.9 % IV SOLN
INTRAVENOUS | Status: DC
  Administered 2018-06-08: 17:00:00 via INTRAVENOUS

## 2018-06-08 MED ORDER — POLYETHYLENE GLYCOL 3350 17 G PO PACK
17.0000 g | PACK | Freq: Every day | ORAL | Status: DC | PRN
  Administered 2018-06-10: 17 g via ORAL
  Filled 2018-06-08: qty 1

## 2018-06-08 MED ORDER — FENTANYL CITRATE (PF) 100 MCG/2ML IJ SOLN
50.0000 ug | Freq: Once | INTRAMUSCULAR | Status: AC
  Administered 2018-06-08: 50 ug via INTRAVENOUS
  Filled 2018-06-08: qty 2

## 2018-06-08 MED ORDER — ATORVASTATIN CALCIUM 10 MG PO TABS
10.0000 mg | ORAL_TABLET | Freq: Every day | ORAL | Status: DC
  Administered 2018-06-08 – 2018-06-10 (×3): 10 mg via ORAL
  Filled 2018-06-08 (×3): qty 1

## 2018-06-08 MED ORDER — ACETAMINOPHEN 325 MG PO TABS: 650.0000 mg | ORAL_TABLET | Freq: Four times a day (QID) | ORAL | Status: DC | PRN

## 2018-06-08 MED ORDER — ONDANSETRON HCL 4 MG/2ML IJ SOLN
4.0000 mg | Freq: Once | INTRAMUSCULAR | Status: AC
  Administered 2018-06-08: 4 mg via INTRAVENOUS
  Filled 2018-06-08: qty 2

## 2018-06-08 MED ORDER — ONDANSETRON HCL 4 MG/2ML IJ SOLN: 4.0000 mg | Freq: Four times a day (QID) | INTRAMUSCULAR | Status: DC | PRN

## 2018-06-08 MED ORDER — METOPROLOL SUCCINATE ER 50 MG PO TB24
50.0000 mg | ORAL_TABLET | Freq: Every evening | ORAL | Status: DC
  Administered 2018-06-08 – 2018-06-10 (×3): 50 mg via ORAL
  Filled 2018-06-08 (×3): qty 1

## 2018-06-08 MED ORDER — HYDROCODONE-ACETAMINOPHEN 5-325 MG PO TABS
1.0000 | ORAL_TABLET | ORAL | Status: DC | PRN
  Administered 2018-06-08: 2 via ORAL
  Filled 2018-06-08: qty 2

## 2018-06-08 MED ORDER — VANCOMYCIN HCL IN DEXTROSE 1-5 GM/200ML-% IV SOLN
1000.0000 mg | Freq: Once | INTRAVENOUS | Status: AC
  Administered 2018-06-09: 1000 mg via INTRAVENOUS
  Filled 2018-06-08: qty 200

## 2018-06-08 NOTE — Progress Notes (Signed)
Chaplain responded to an OR for prayer. Spouse was at the bedside. Introductions were made and Chaplain prayed for restored health. Chaplain let Pt know Chaplain availability is 24/7 if needed    06/08/18 1600  Clinical Encounter Type  Visited With Patient and family together  Visit Type Initial  Referral From Nurse  Spiritual Encounters  Spiritual Needs Prayer

## 2018-06-08 NOTE — ED Provider Notes (Signed)
[facility] Emergency Department Provider Note  ____________________________________________  Time seen: Approximately 1:36 PM  I have reviewed the triage vital signs and the nursing notes.   HISTORY  Chief Complaint Hip Pain and Fall   HPI Natalie Gardner is a 73 y.o. female with a history of A. fib on Coumadin, chronic kidney disease, CHF, hypertension, hyperlipidemia, anemia who presents for evaluation of left hip pain status post mechanical fall.  Patient was shopping with her daughter and was returning to the electric shopping buggy in the store when she lost her balance and fell.  The electric buggy fell on top of her.  She denies head trauma or LOC.  She denies back pain or neck pain.  She is complaining of pain in her left hip which has been constant since the fall which happened just prior to arrival.  The pain is mild with no movement but becomes severe with any movement of the leg.  Past Medical History:  Diagnosis Date  . Anemia   . Aortic valve regurgitation   . Arthritis   . Atrial fibrillation ([place])   . Cancer ([place]) 12/2016   Basal Cell Skin Cancer  . Cardiomyopathy ([place])   . CHF (congestive heart failure) ([place])   . CKD (chronic kidney disease)   . Colitis   . Diverticulosis   . Dyspnea   . Dysrhythmia   . GERD (gastroesophageal reflux disease)   . GI bleed   . Heart murmur   . History of hiatal hernia   . Hypercholesterolemia   . Hyperlipidemia   . Hypertension   . Insomnia   . Osteopenia   . Pre-diabetes   . Urinary, incontinence, stress female   . Vertigo     Patient Active Problem List   Diagnosis Date Noted  . Depression, major, single episode, moderate ([place]) 11/14/2017  . IDA (iron deficiency anemia) 07/31/2017  . Closed displaced fracture of head of left radius 06/20/2017  . Anemia, unspecified 05/14/2017  . Insomnia 04/10/2017  . Status post total left knee replacement 02/12/2017  . 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  Advanced care planning/counseling discussion 09/28/2016  . Essential hypertension 09/13/2015  . CHF (congestive heart failure) ([place]) 09/13/2015  . Atrial fibrillation ([place]) 09/13/2015  . Hyperlipidemia 09/13/2015  . Diabetes mellitus without complication ([place]) 20/25/4270    Past Surgical History:  Procedure Laterality Date  . ANKLE SURGERY Right 1997   Fractured ankle  . COLONOSCOPY    . COLONOSCOPY WITH PROPOFOL N/A 02/08/2015   Procedure: COLONOSCOPY WITH PROPOFOL;  Surgeon: Manya Silvas, MD;  Location: [facility] ENDOSCOPY;  Service: Endoscopy;  Laterality: N/A;  . COLONOSCOPY WITH PROPOFOL N/A 08/20/2017   Procedure: COLONOSCOPY WITH PROPOFOL;  Surgeon: Manya Silvas, MD;  Location: [facility] ENDOSCOPY;  Service: Endoscopy;  Laterality: N/A;  . ESOPHAGOGASTRODUODENOSCOPY (EGD) WITH PROPOFOL N/A 08/20/2017   Procedure: ESOPHAGOGASTRODUODENOSCOPY (EGD) WITH PROPOFOL;  Surgeon: Manya Silvas, MD;  Location: [facility] ENDOSCOPY;  Service: Endoscopy;  Laterality: N/A;  . KNEE ARTHROPLASTY Left 02/12/2017   Procedure: COMPUTER ASSISTED TOTAL KNEE ARTHROPLASTY;  Surgeon: Dereck Leep, MD;  Location: [facility] ORS;  Service: Orthopedics;  Laterality: Left;  . RADIAL HEAD ARTHROPLASTY Left 06/21/2017   Procedure: RADIAL HEAD ARTHROPLASTY;  Surgeon: Corky Mull, MD;  Location: [facility] ORS;  Service: Orthopedics;  Laterality: Left;  . SACROCOCCYGEAL ULCER REMOVAL    . TONSILLECTOMY    . TOTAL KNEE ARTHROPLASTY Left     Prior to Admission medications  Medication Sig Start Date End Date Taking? Authorizing Provider  acetaminophen (TYLENOL) 650 MG CR tablet Take 1,300 mg by mouth at bedtime.    [provider]  atorvastatin (LIPITOR) 10 MG tablet TAKE 1 TABLET BY MOUTH AT BEDTIME FOR CHOLESTEROL 11/14/17   Guadalupe Maple, MD  diltiazem (CARDIZEM CD) 180 MG 24 hr capsule TAKE 1 CAPSULE (180 MG TOTAL) BY MOUTH ONCE DAILY. 03/28/18   [provider]  diltiazem (DILACOR XR) 180 MG 24 hr  capsule Take 180 mg by mouth daily.    [provider]  diphenhydrAMINE (BENADRYL) 25 MG tablet Take 25 mg by mouth 2 (two) times daily as needed for allergies.    [provider]  ferrous sulfate 325 (65 FE) MG tablet Take 1 tablet (325 mg total) by mouth 2 (two) times daily with a meal. 11/14/17   Crissman, Jeannette How, MD  furosemide (LASIX) 20 MG tablet Take 1 tablet (20 mg total) by mouth daily as needed for edema. 11/14/17   Guadalupe Maple, MD  hydrochlorothiazide (HYDRODIURIL) 25 MG tablet Take 1 tablet (25 mg total) by mouth daily. 11/14/17   Guadalupe Maple, MD  lisinopril (PRINIVIL,ZESTRIL) 40 MG tablet Take 1 tablet (40 mg total) by mouth daily. 11/14/17   Guadalupe Maple, MD  metoprolol succinate (TOPROL-XL) 50 MG 24 hr tablet Take 50 mg by mouth every evening.  03/23/16   [provider]  Multiple Vitamin (MULTI-VITAMINS) TABS Take 1 tablet by mouth daily.    [provider]  Omega-3 1000 MG CAPS Take 1,000 mg by mouth daily.     [provider]  ranitidine (ZANTAC) 150 MG tablet Take 150 mg by mouth daily.    [provider]  spironolactone (ALDACTONE) 25 MG tablet Take 25 mg by mouth every evening.     [provider]  sucralfate (CARAFATE) 1 G tablet Take 1 g by mouth 2 (two) times daily.  02/18/14   [provider]  traZODone (DESYREL) 50 MG tablet Take 0.5-1 tablets (25-50 mg total) by mouth at bedtime as needed for sleep. 11/14/17   Guadalupe Maple, MD  traZODone (DESYREL) 50 MG tablet TAKE 0.5-1 TABLETS (25-50 MG TOTAL) AT BEDTIME AS NEEDED BY MOUTH FOR SLEEP. 04/23/18   Guadalupe Maple, MD  venlafaxine XR (EFFEXOR XR) 75 MG 24 hr capsule Take 1 capsule (75 mg total) by mouth daily with breakfast. 12/13/17   Guadalupe Maple, MD  warfarin (COUMADIN) 2.5 MG tablet Take 2.5 mg every other day with the 5 mg tablet 03/27/16   [provider]  warfarin (COUMADIN) 5 MG tablet Take 5 mg by mouth daily.     [provider]    Allergies Penicillins; Penicillin v potassium; Protonix [pantoprazole sodium]; and Sulfa antibiotics  Family History  Problem Relation Age of Onset  . Heart disease Mother   . Diabetes Mother   . Heart disease Father   . Parkinson's disease Father   . Diabetes Brother   . Cancer Brother        Colon    Social History Social History   Tobacco Use  . Smoking status: Never Smoker  . Smokeless tobacco: Never Used  Substance Use Topics  . Alcohol use: No  . Drug use: No    Review of Systems Constitutional: Negative for fever. Eyes: Negative for visual changes. ENT: Negative for facial injury or neck injury Cardiovascular: Negative for chest injury. Respiratory: Negative for shortness of breath. Negative  for chest wall injury. Gastrointestinal: Negative for abdominal pain or injury. Genitourinary: Negative for dysuria. Musculoskeletal: Negative for back injury, + L hip pain Skin: Negative for laceration/abrasions. Neurological: Negative for head injury.   ____________________________________________   PHYSICAL EXAM:  VITAL SIGNS: ED Triage Vitals  Enc Vitals Group     BP 06/08/18 1232 134/77     Pulse Rate 06/08/18 1232 100     Resp 06/08/18 1232 14     Temp 06/08/18 1232 98.9 F (37.2 C)     Temp Source 06/08/18 1232 Oral     SpO2 06/08/18 1232 100 %     Weight 06/08/18 1233 170 lb (77.1 kg)     Height --      Head Circumference --      Peak Flow --      Pain Score 06/08/18 1233 8     Pain Loc --      Pain Edu? --      Excl. in [place]? --    Full spinal precautions maintained throughout the trauma exam. Constitutional: Alert and oriented. No acute distress. Does not appear intoxicated. HEENT Head: Normocephalic and atraumatic. Face: No facial bony tenderness. Stable midface Ears: No hemotympanum bilaterally. No Battle sign Eyes: No eye injury. PERRL. No raccoon eyes Nose: Nontender. No epistaxis. No rhinorrhea Mouth/Throat:  Mucous membranes are moist. No oropharyngeal blood. No dental injury. Airway patent without stridor. Normal voice. Neck: C-collar in place. No midline c-spine tenderness.  Cardiovascular: Normal rate, regular rhythm. Normal and symmetric distal pulses are present in all extremities. Pulmonary/Chest: Chest wall is stable and nontender to palpation/compression. Normal respiratory effort. Breath sounds are normal. No crepitus.  Abdominal: Soft, nontender, non distended. Musculoskeletal: Shortened externally rotated left lower extremity with tenderness to palpation over the hip, nontender with normal full range of motion in all other extremities. No thoracic or lumbar midline spinal tenderness. Pelvis is stable. Skin: Skin is warm, dry and intact. No abrasions or contutions. Psychiatric: Speech and behavior are appropriate. Neurological: Normal speech and language. Moves all extremities to command. No gross focal neurologic deficits are appreciated.  Glascow Coma Score: 4 - Opens eyes on own 6 - Follows simple motor commands 5 - Alert and oriented GCS: 15  ____________________________________________   LABS (all labs ordered are listed, but only abnormal results are displayed)  Labs Reviewed  CBC - Abnormal; Notable for the following components:      Result Value   RBC 3.86 (*)    All other components within normal limits  BASIC METABOLIC PANEL - Abnormal; Notable for the following components:   CO2 21 (*)    Glucose, Bld 135 (*)    BUN 36 (*)    Creatinine, Ser 1.28 (*)    GFR calc non Af Amer 41 (*)    GFR calc Af Amer 48 (*)    All other components within normal limits  PROTIME-INR - Abnormal; Notable for the following components:   Prothrombin Time 27.3 (*)    All other components within normal limits   ____________________________________________  EKG  ED ECG REPORT I, Rudene Re, the attending physician, personally viewed and interpreted this ECG.  Afib, rate  100, normal QTc, no ST elevations or depressions. ____________________________________________  RADIOLOGY  I have personally reviewed the images performed during this visit and I agree with the Radiologist's read.   Interpretation by Radiologist:  Dg Chest 1 View  Result Date: 06/08/2018 CLINICAL DATA:  Fall today. Left hip fracture. Hx of A-fib,  CHF, HTN. Nonsmoker. EXAM: CHEST  1 VIEW COMPARISON:  05/25/2011 FINDINGS: Stable linear scarring laterally at the left lung base. Lungs otherwise clear. Mild cardiomegaly. No effusion. Visualized bones unremarkable. IMPRESSION: 1. mild cardiomegaly. No acute cardiopulmonary findings. Electronically Signed   By: Lucrezia Europe M.D.   On: 06/08/2018 13:28   Dg Hip Unilat With Pelvis 2-3 Views Left  Result Date: 06/08/2018 CLINICAL DATA:  Left hip pain after fall today. Left leg shortened and externally rotated. No previous injury or surgery to left hip. EXAM: DG HIP (WITH OR WITHOUT PELVIS) 2-3V LEFT COMPARISON:  CT 12/29/2014 FINDINGS: Comminuted intertrochanteric intertrochanteric fracture of the left femur. The lesser trochanter is a separate fragment, distracted approximately 9 mm. No dislocation. Bony pelvis intact. Spondylitic changes in the visualized lower lumbar spine IMPRESSION: Comminuted intertrochanteric fracture, left femur. Electronically Signed   By: Lucrezia Europe M.D.   On: 06/08/2018 13:27    ____________________________________________   PROCEDURES  Procedure(s) performed: None Procedures Critical Care performed:  None ____________________________________________   INITIAL IMPRESSION / ASSESSMENT AND PLAN / ED COURSE   73 y.o. female with a history of A. fib on Coumadin, chronic kidney disease, CHF, hypertension, hyperlipidemia, anemia who presents for evaluation of left hip pain status post mechanical fall.  Patient arrives with left lower extremity shortened externally rotated.  Strong distal pulses.  No other injuries based on  history and physical exam.  X-ray confirms a comminuted intertrochanteric fracture of the left femur.  Discussed with Dr. Mack Guise from orthopedics who recommended admission to the hospitalist for surgical management once patient is medically cleared.  Labs for medical clearance and chest x-ray have been done.      As part of my medical decision making, I reviewed the following data within the [facility] notes reviewed and incorporated, Labs reviewed , EKG interpreted , Old chart reviewed, Radiograph reviewed , Discussed with admitting physician , A consult was requested and obtained from this/these consultant(s) Orthopedics, Notes from prior ED visits and [place] Controlled Substance Database    Pertinent labs & imaging results that were available during my care of the patient were reviewed by me and considered in my medical decision making (see chart for details).    ____________________________________________   FINAL CLINICAL IMPRESSION(S) / ED DIAGNOSES  Final diagnoses:  Fall, initial encounter  Closed fracture of left hip, initial encounter ([place])      NEW MEDICATIONS STARTED DURING THIS VISIT:  ED Discharge Orders    None       Note:  This document was prepared using Dragon voice recognition software and may include unintentional dictation errors.    Alfred Levins, [place], MD 06/08/18 1359

## 2018-06-08 NOTE — ED Triage Notes (Signed)
Pt presents via EMS s/p Mechanical fall while shopping. C/o left hip pain with left leg shortening per EMS.

## 2018-06-08 NOTE — Consult Note (Signed)
ORTHOPAEDIC CONSULTATION  REQUESTING PHYSICIAN: Gladstone Lighter, MD  Chief Complaint: Left hip fracture  HPI: Natalie Gardner is a 73 y.o. female who complains of pain in the left hip after a fall while shopping today.  Patient had deformity and inability to bear weight after her fall.  She denies other injuries.  She denies any numbness or tingling in the left lower extremity.  She is seen in the emergency department with her family at the bedside.  Past Medical History:  Diagnosis Date  . Anemia   . Aortic valve regurgitation   . Arthritis   . Atrial fibrillation ([place])   . Cancer ([place]) 12/2016   Basal Cell Skin Cancer  . Cardiomyopathy ([place])   . CHF (congestive heart failure) ([place])   . CKD (chronic kidney disease)   . Colitis   . Diverticulosis   . Dyspnea   . Dysrhythmia   . GERD (gastroesophageal reflux disease)   . GI bleed   . Heart murmur   . History of hiatal hernia   . Hypercholesterolemia   . Hyperlipidemia   . Hypertension   . Insomnia   . Osteopenia   . Pre-diabetes   . Urinary, incontinence, stress female   . Vertigo    Past Surgical History:  Procedure Laterality Date  . ANKLE SURGERY Right 1997   Fractured ankle  . COLONOSCOPY    . COLONOSCOPY WITH PROPOFOL N/A 02/08/2015   Procedure: COLONOSCOPY WITH PROPOFOL;  Surgeon: Manya Silvas, MD;  Location: [facility] ENDOSCOPY;  Service: Endoscopy;  Laterality: N/A;  . COLONOSCOPY WITH PROPOFOL N/A 08/20/2017   Procedure: COLONOSCOPY WITH PROPOFOL;  Surgeon: Manya Silvas, MD;  Location: [facility] ENDOSCOPY;  Service: Endoscopy;  Laterality: N/A;  . ESOPHAGOGASTRODUODENOSCOPY (EGD) WITH PROPOFOL N/A 08/20/2017   Procedure: ESOPHAGOGASTRODUODENOSCOPY (EGD) WITH PROPOFOL;  Surgeon: Manya Silvas, MD;  Location: [facility] ENDOSCOPY;  Service: Endoscopy;  Laterality: N/A;  . KNEE ARTHROPLASTY Left 02/12/2017   Procedure: COMPUTER ASSISTED TOTAL KNEE ARTHROPLASTY;  Surgeon: Dereck Leep, MD;  Location: [facility] ORS;   Service: Orthopedics;  Laterality: Left;  . RADIAL HEAD ARTHROPLASTY Left 06/21/2017   Procedure: RADIAL HEAD ARTHROPLASTY;  Surgeon: Corky Mull, MD;  Location: [facility] ORS;  Service: Orthopedics;  Laterality: Left;  . SACROCOCCYGEAL ULCER REMOVAL    . TONSILLECTOMY    . TOTAL KNEE ARTHROPLASTY Left    Social History   Socioeconomic History  . Marital status: Married    Spouse name: Not on file  . Number of children: Not on file  . Years of education: Not on file  . Highest education level: 12th grade  Occupational History  . Not on file  Social Needs  . Financial resource strain: Not hard at all  . Food insecurity:    Worry: Never true    Inability: Never true  . Transportation needs:    Medical: No    Non-medical: No  Tobacco Use  . Smoking status: Never Smoker  . Smokeless tobacco: Never Used  Substance and Sexual Activity  . Alcohol use: No  . Drug use: No  . Sexual activity: Not on file  Lifestyle  . Physical activity:    Days per week: 0 days    Minutes per session: 0 min  . Stress: Not at all  Relationships  . Social connections:    Talks on phone: More than three times a week    Gets together: More than three times a week    Attends religious service:  More than 4 times per year    Active member of club or organization: No    Attends meetings of clubs or organizations: Never    Relationship status: Married  Other Topics Concern  . Not on file  Social History Narrative  . Not on file   Family History  Problem Relation Age of Onset  . Heart disease Mother   . Diabetes Mother   . Heart disease Father   . Parkinson's disease Father   . Diabetes Brother   . Cancer Brother        Colon   Allergies  Allergen Reactions  . Penicillins     Yeast infections Has patient had a PCN reaction causing immediate rash, facial/tongue/throat swelling, SOB or lightheadedness with hypotension: No Has patient had a PCN reaction causing severe rash involving mucus  membranes or skin necrosis: No Has patient had a PCN reaction that required hospitalization: No Has patient had a PCN reaction occurring within the last 10 years: No If all of the above answers are "NO", then may proceed with Cephalosporin use.   Marland Kitchen Penicillin V Potassium Other (See Comments)    Yeast infection  . Protonix [Pantoprazole Sodium] Rash  . Sulfa Antibiotics     Yeast infections  Other reaction(s): Other (See Comments) Yeast infection   Prior to Admission medications   Medication Sig Start Date End Date Taking? Authorizing Provider  acetaminophen (TYLENOL) 650 MG CR tablet Take 1,300 mg by mouth at bedtime.    [provider]  atorvastatin (LIPITOR) 10 MG tablet TAKE 1 TABLET BY MOUTH AT BEDTIME FOR CHOLESTEROL 11/14/17   Guadalupe Maple, MD  diltiazem (CARDIZEM CD) 180 MG 24 hr capsule TAKE 1 CAPSULE (180 MG TOTAL) BY MOUTH ONCE DAILY. 03/28/18   [provider]  diltiazem (DILACOR XR) 180 MG 24 hr capsule Take 180 mg by mouth daily.    [provider]  diphenhydrAMINE (BENADRYL) 25 MG tablet Take 25 mg by mouth 2 (two) times daily as needed for allergies.    [provider]  ferrous sulfate 325 (65 FE) MG tablet Take 1 tablet (325 mg total) by mouth 2 (two) times daily with a meal. 11/14/17   Crissman, Jeannette How, MD  furosemide (LASIX) 20 MG tablet Take 1 tablet (20 mg total) by mouth daily as needed for edema. 11/14/17   Guadalupe Maple, MD  hydrochlorothiazide (HYDRODIURIL) 25 MG tablet Take 1 tablet (25 mg total) by mouth daily. 11/14/17   Guadalupe Maple, MD  lisinopril (PRINIVIL,ZESTRIL) 40 MG tablet Take 1 tablet (40 mg total) by mouth daily. 11/14/17   Guadalupe Maple, MD  metoprolol succinate (TOPROL-XL) 50 MG 24 hr tablet Take 50 mg by mouth every evening.  03/23/16   [provider]  Multiple Vitamin (MULTI-VITAMINS) TABS Take 1 tablet by mouth daily.    [provider]  Omega-3 1000 MG CAPS Take 1,000 mg by mouth  daily.     [provider]  ranitidine (ZANTAC) 150 MG tablet Take 150 mg by mouth daily.    [provider]  spironolactone (ALDACTONE) 25 MG tablet Take 25 mg by mouth every evening.     [provider]  sucralfate (CARAFATE) 1 G tablet Take 1 g by mouth 2 (two) times daily.  02/18/14   [provider]  traZODone (DESYREL) 50 MG tablet Take 0.5-1 tablets (25-50 mg total) by mouth at bedtime as needed for sleep. 11/14/17   Guadalupe Maple, MD  traZODone (DESYREL) 50 MG tablet TAKE 0.5-1 TABLETS (25-50 MG TOTAL) AT BEDTIME AS NEEDED BY MOUTH FOR SLEEP. 04/23/18   Guadalupe Maple, MD  venlafaxine XR (EFFEXOR XR) 75 MG 24 hr capsule Take 1 capsule (75 mg total) by mouth daily with breakfast. 12/13/17   Guadalupe Maple, MD  warfarin (COUMADIN) 2.5 MG tablet Take 2.5 mg every other day with the 5 mg tablet 03/27/16   [provider]  warfarin (COUMADIN) 5 MG tablet Take 5 mg by mouth daily.    [provider]   Dg Chest 1 View  Result Date: 06/08/2018 CLINICAL DATA:  Fall today. Left hip fracture. Hx of A-fib, CHF, HTN. Nonsmoker. EXAM: CHEST  1 VIEW COMPARISON:  05/25/2011 FINDINGS: Stable linear scarring laterally at the left lung base. Lungs otherwise clear. Mild cardiomegaly. No effusion. Visualized bones unremarkable. IMPRESSION: 1. mild cardiomegaly. No acute cardiopulmonary findings. Electronically Signed   By: Lucrezia Europe M.D.   On: 06/08/2018 13:28   Dg Hip Unilat With Pelvis 2-3 Views Left  Result Date: 06/08/2018 CLINICAL DATA:  Left hip pain after fall today. Left leg shortened and externally rotated. No previous injury or surgery to left hip. EXAM: DG HIP (WITH OR WITHOUT PELVIS) 2-3V LEFT COMPARISON:  CT 12/29/2014 FINDINGS: Comminuted intertrochanteric intertrochanteric fracture of the left femur. The lesser trochanter is a separate fragment, distracted approximately 9 mm. No dislocation. Bony pelvis intact. Spondylitic changes in the  visualized lower lumbar spine IMPRESSION: Comminuted intertrochanteric fracture, left femur. Electronically Signed   By: Lucrezia Europe M.D.   On: 06/08/2018 13:27    Positive ROS: All other systems have been reviewed and were otherwise negative with the exception of those mentioned in the HPI and as above.  Physical Exam: General: Alert, no acute distress  MUSCULOSKELETAL: Left lower extremity: Patient's lower extremity is shortened and externally rotated.  Her skin is intact.  Her thigh compartments are soft and compressible.  She has palpable pedal pulses, intact sensation light touch and intact motor function distally.  Assessment: Closed, comminuted four-part intertrochanteric left hip fracture  Plan: Patient is currently on Coumadin.  Her INR is 2.58.  Patient will be admitted for preop clearance by Dr. Tressia Miners.  Is reported to have recently seen Dr. Nehemiah Massed.  Patient will be given a dose of IV vitamin K for reversal of the Coumadin.  Patient will be n.p.o. after midnight.    Patient will require intramedullary fixation of this left comminuted intertrochanteric hip fracture.  I have reviewed the details of the operation as well as the postoperative course with the patient and her family.  The risks and benefits of the surgery were also discussed.  They understand the risks include infection, bleeding requiring blood transfusion, nerve or blood vessel injury, possible leg length discrepancy or change in lower extremity rotation, persistent left hip pain, malunion, nonunion, hardware failure and the need for further surgery.  The medical risks include but are not limited to DVT and pulmonary embolism, myocardial infarction, stroke, pneumonia, respiratory failure and death.  Patient and family understood these risks and agreed with the plan for surgery    Thornton Park, MD    06/08/2018 2:06 PM

## 2018-06-08 NOTE — H&P (Signed)
[facility] [place] NAME: Natalie Gardner    MR#:  [id number]  DATE OF BIRTH:  09/08/1945  DATE OF ADMISSION:  06/08/2018  PRIMARY CARE PHYSICIAN: Guadalupe Maple, MD   REQUESTING/REFERRING PHYSICIAN: Dr. Rudene Re  CHIEF COMPLAINT:   Chief Complaint  Patient presents with  . Hip Pain  . Fall    HISTORY OF PRESENT ILLNESS:  Natalie Gardner  is a 73 y.o. female with a known history of hypertension, A. fib on Coumadin, GERD, depression, CKD presents to hospital secondary to a fall and left hip pain. Patient states she has been in her normal state of health up until today, she was with her family shopping today.  She walked over to grab some fingers after leaving her shopping cart, next thing she realized that she lost her balance and fell on the floor with a cart over her.  She immediately felt pain in her left hip and could not get up.  Was brought here to the hospital by ambulance.  Denies any dizziness or lightheadedness.  No chest pain, nausea or syncopal episodes.  Denies hitting her head, denies losing consciousness after the episode.   X-ray shows left hip intertrochanteric fracture.  INR is greater than 2 due to being on Coumadin.  Being admitted for left hip fracture.  PAST MEDICAL HISTORY:   Past Medical History:  Diagnosis Date  . Anemia   . Aortic valve regurgitation   . Arthritis   . Atrial fibrillation ([place])   . Cancer ([place]) 12/2016   Basal Cell Skin Cancer  . Cardiomyopathy ([place])   . CHF (congestive heart failure) ([place])   . CKD (chronic kidney disease)   . Colitis   . Diverticulosis   . Dyspnea   . Dysrhythmia   . GERD (gastroesophageal reflux disease)   . GI bleed   . Heart murmur   . History of hiatal hernia   . Hypercholesterolemia   . Hyperlipidemia   . Hypertension   . Insomnia   . Osteopenia   . Pre-diabetes   . Urinary, incontinence, stress female   . Vertigo     PAST SURGICAL HISTORY:    Past Surgical History:  Procedure Laterality Date  . ANKLE SURGERY Right 1997   Fractured ankle  . COLONOSCOPY    . COLONOSCOPY WITH PROPOFOL N/A 02/08/2015   Procedure: COLONOSCOPY WITH PROPOFOL;  Surgeon: Manya Silvas, MD;  Location: [facility] ENDOSCOPY;  Service: Endoscopy;  Laterality: N/A;  . COLONOSCOPY WITH PROPOFOL N/A 08/20/2017   Procedure: COLONOSCOPY WITH PROPOFOL;  Surgeon: Manya Silvas, MD;  Location: [facility] ENDOSCOPY;  Service: Endoscopy;  Laterality: N/A;  . ESOPHAGOGASTRODUODENOSCOPY (EGD) WITH PROPOFOL N/A 08/20/2017   Procedure: ESOPHAGOGASTRODUODENOSCOPY (EGD) WITH PROPOFOL;  Surgeon: Manya Silvas, MD;  Location: [facility] ENDOSCOPY;  Service: Endoscopy;  Laterality: N/A;  . KNEE ARTHROPLASTY Left 02/12/2017   Procedure: COMPUTER ASSISTED TOTAL KNEE ARTHROPLASTY;  Surgeon: Dereck Leep, MD;  Location: [facility] ORS;  Service: Orthopedics;  Laterality: Left;  . RADIAL HEAD ARTHROPLASTY Left 06/21/2017   Procedure: RADIAL HEAD ARTHROPLASTY;  Surgeon: Corky Mull, MD;  Location: [facility] ORS;  Service: Orthopedics;  Laterality: Left;  . SACROCOCCYGEAL ULCER REMOVAL    . TONSILLECTOMY    . TOTAL KNEE ARTHROPLASTY Left     SOCIAL HISTORY:   Social History   Tobacco Use  . Smoking status: Never Smoker  . Smokeless tobacco: Never Used  Substance Use Topics  .  Alcohol use: No    FAMILY HISTORY:   Family History  Problem Relation Age of Onset  . Heart disease Mother   . Diabetes Mother   . Heart disease Father   . Parkinson's disease Father   . Diabetes Brother   . Cancer Brother        Colon    DRUG ALLERGIES:   Allergies  Allergen Reactions  . Penicillins     Yeast infections Has patient had a PCN reaction causing immediate rash, facial/tongue/throat swelling, SOB or lightheadedness with hypotension: No Has patient had a PCN reaction causing severe rash involving mucus membranes or skin necrosis: No Has patient had a PCN reaction that required  hospitalization: No Has patient had a PCN reaction occurring within the last 10 years: No If all of the above answers are "NO", then may proceed with Cephalosporin use.   Marland Kitchen Penicillin V Potassium Other (See Comments)    Yeast infection  . Protonix [Pantoprazole Sodium] Rash  . Sulfa Antibiotics     Yeast infections  Other reaction(s): Other (See Comments) Yeast infection    REVIEW OF SYSTEMS:   Review of Systems  Constitutional: Negative for chills, fever, malaise/fatigue and weight loss.  HENT: Negative for ear discharge, ear pain, hearing loss, nosebleeds and tinnitus.   Eyes: Negative for blurred vision, double vision and photophobia.  Respiratory: Negative for cough, hemoptysis, shortness of breath and wheezing.   Cardiovascular: Negative for chest pain, palpitations, orthopnea and leg swelling.  Gastrointestinal: Negative for abdominal pain, constipation, diarrhea, heartburn, melena, nausea and vomiting.  Genitourinary: Negative for dysuria, frequency, hematuria and urgency.  Musculoskeletal: Positive for joint pain and myalgias. Negative for back pain and neck pain.  Skin: Negative for rash.  Neurological: Positive for focal weakness. Negative for dizziness, tingling, tremors, sensory change, speech change and headaches.  Endo/Heme/Allergies: Does not bruise/bleed easily.  Psychiatric/Behavioral: Negative for depression. The patient is nervous/anxious.     MEDICATIONS AT HOME:   Prior to Admission medications   Medication Sig Start Date End Date Taking? Authorizing Provider  acetaminophen (TYLENOL) 650 MG CR tablet Take 1,300 mg by mouth at bedtime.   Yes [provider]  atorvastatin (LIPITOR) 10 MG tablet TAKE 1 TABLET BY MOUTH AT BEDTIME FOR CHOLESTEROL Patient taking differently: Take 10 mg by mouth daily.  11/14/17  Yes Crissman, Jeannette How, MD  diltiazem (DILACOR XR) 180 MG 24 hr capsule Take 180 mg by mouth daily.   Yes [provider]  diphenhydrAMINE  (BENADRYL) 25 MG tablet Take 25 mg by mouth 2 (two) times daily as needed for allergies.   Yes [provider]  ferrous sulfate 325 (65 FE) MG tablet Take 1 tablet (325 mg total) by mouth 2 (two) times daily with a meal. 11/14/17  Yes Crissman, Jeannette How, MD  furosemide (LASIX) 20 MG tablet Take 1 tablet (20 mg total) by mouth daily as needed for edema. 11/14/17  Yes Crissman, Jeannette How, MD  hydrochlorothiazide (HYDRODIURIL) 25 MG tablet Take 1 tablet (25 mg total) by mouth daily. 11/14/17  Yes Crissman, Jeannette How, MD  lisinopril (PRINIVIL,ZESTRIL) 40 MG tablet Take 1 tablet (40 mg total) by mouth daily. 11/14/17  Yes Crissman, Jeannette How, MD  metoprolol succinate (TOPROL-XL) 50 MG 24 hr tablet Take 50 mg by mouth every evening.  03/23/16  Yes [provider]  Multiple Vitamin (MULTI-VITAMINS) TABS Take 1 tablet by mouth daily.   Yes [provider]  ranitidine (ZANTAC) 150 MG tablet Take 150  mg by mouth daily.   Yes [provider]  spironolactone (ALDACTONE) 25 MG tablet Take 25 mg by mouth every evening.    Yes [provider]  sucralfate (CARAFATE) 1 G tablet Take 1 g by mouth 2 (two) times daily.  02/18/14  Yes [provider]  traZODone (DESYREL) 50 MG tablet Take 0.5-1 tablets (25-50 mg total) by mouth at bedtime as needed for sleep. 11/14/17  Yes Guadalupe Maple, MD  venlafaxine XR (EFFEXOR XR) 75 MG 24 hr capsule Take 1 capsule (75 mg total) by mouth daily with breakfast. 12/13/17  Yes Crissman, Jeannette How, MD  warfarin (COUMADIN) 5 MG tablet Take 2.5-5 mg by mouth See admin instructions. Take 1 tablet (5mg ) by mouth every evening 6 days a week and take 1 tablets (7.5mg ) by mouth in the evening once weekly   Yes [provider]  traZODone (DESYREL) 50 MG tablet TAKE 0.5-1 TABLETS (25-50 MG TOTAL) AT BEDTIME AS NEEDED BY MOUTH FOR SLEEP. 04/23/18   Guadalupe Maple, MD      VITAL SIGNS:  Blood pressure 134/77, pulse (!) 109, temperature 98.9 F (37.2  C), temperature source Oral, resp. rate 14, weight 77.1 kg, SpO2 97 %.  PHYSICAL EXAMINATION:   Physical Exam  GENERAL:  73 y.o.-year-old patient lying in the bed with no acute distress.  EYES: Pupils equal, round, reactive to light and accommodation. No scleral icterus. Extraocular muscles intact.  HEENT: Head atraumatic, normocephalic. Oropharynx and nasopharynx clear.  NECK:  Supple, no jugular venous distention. No thyroid enlargement, no tenderness.  LUNGS: Normal breath sounds bilaterally, no wheezing, rales,rhonchi or crepitation. No use of accessory muscles of respiration.  CARDIOVASCULAR: S1, S2 normal. No murmurs, rubs, or gallops.  ABDOMEN: Soft, nontender, nondistended. Bowel sounds present. No organomegaly or mass.  EXTREMITIES: left hip is abducted and externally rotated -No pedal edema, cyanosis, or clubbing.  NEUROLOGIC: Cranial nerves II through XII are intact. Muscle strength 5/5 in all extremities except left leg mobility is limited secondary to pain.. Sensation intact. Gait not checked.  PSYCHIATRIC: The patient is alert and oriented x 3.  SKIN: No obvious rash, lesion, or ulcer.   LABORATORY PANEL:   CBC Recent Labs  Lab 06/08/18 1238  WBC 9.1  HGB 12.6  HCT 38.6  PLT 277   ------------------------------------------------------------------------------------------------------------------  Chemistries  Recent Labs  Lab 06/08/18 1238  NA 137  K 4.9  CL 107  CO2 21*  GLUCOSE 135*  BUN 36*  CREATININE 1.28*  CALCIUM 9.1   ------------------------------------------------------------------------------------------------------------------  Cardiac Enzymes No results for input(s): TROPONINI in the last 168 hours. ------------------------------------------------------------------------------------------------------------------  RADIOLOGY:  Dg Chest 1 View  Result Date: 06/08/2018 CLINICAL DATA:  Fall today. Left hip fracture. Hx of A-fib, CHF, HTN.  Nonsmoker. EXAM: CHEST  1 VIEW COMPARISON:  05/25/2011 FINDINGS: Stable linear scarring laterally at the left lung base. Lungs otherwise clear. Mild cardiomegaly. No effusion. Visualized bones unremarkable. IMPRESSION: 1. mild cardiomegaly. No acute cardiopulmonary findings. Electronically Signed   By: Lucrezia Europe M.D.   On: 06/08/2018 13:28   Dg Hip Unilat With Pelvis 2-3 Views Left  Result Date: 06/08/2018 CLINICAL DATA:  Left hip pain after fall today. Left leg shortened and externally rotated. No previous injury or surgery to left hip. EXAM: DG HIP (WITH OR WITHOUT PELVIS) 2-3V LEFT COMPARISON:  CT 12/29/2014 FINDINGS: Comminuted intertrochanteric intertrochanteric fracture of the left femur. The lesser trochanter is a separate fragment, distracted approximately 9 mm. No dislocation. Bony pelvis intact.  Spondylitic changes in the visualized lower lumbar spine IMPRESSION: Comminuted intertrochanteric fracture, left femur. Electronically Signed   By: Lucrezia Europe M.D.   On: 06/08/2018 13:27    EKG:   Orders placed or performed during the hospital encounter of 06/08/18  . ED EKG  . ED EKG  . EKG 12-Lead  . EKG 12-Lead    IMPRESSION AND PLAN:   Natalie Gardner  is a 73 y.o. female with a known history of hypertension, A. fib on Coumadin, GERD, depression, CKD presents to hospital secondary to a fall and left hip pain.  1. Left hip fracture-secondary to mechanical fall and left intertrochanteric fracture. -Has chronic nonvalvular atrial fibrillation.  Recently seen her cardiologist.  Asymptomatic, heart rate seems to be well controlled with her oral medications. -INR is at 2.5 due to being on Coumadin. -Orthopedics consulted, hold Coumadin and will receive vitamin K.  Once INR is less than 1.5, can proceed with surgery. -Pain control recommended.  Physical therapy and DVT prophylaxis will be started after surgery  2.  Atrial fibrillation-has permanent nonvalvular A. fib. -Continue oral  metoprolol and Cardizem.  Patient has.  So bradycardia with resting according to cardiology notes but asymptomatic.  Continue to monitor on off unit telemetry while here -Coumadin will be held in the hospital until after her surgery  3.  CKD stage III-seems to be stable at this time.  Continue to monitor.  Gentle hydration now  4.  GERD-continue home medications  5.  Depression-on Effexor  6.  DVT prophylaxis-will be started after her hip surgery.  INR is therapeutic now.    All the records are reviewed and case discussed with ED provider. Management plans discussed with the patient, family and they are in agreement.  CODE STATUS: Full Code  TOTAL TIME TAKING CARE OF THIS PATIENT: 55 minutes.    Gladstone Lighter M.D on 06/08/2018 at 2:26 PM  Between 7am to 6pm - Pager - [phone]  After 6pm go to [email or web address] - password [email or web address] [facility]  Office  [phone]  CC: Primary care physician; Guadalupe Maple, MD

## 2018-06-09 ENCOUNTER — Inpatient Hospital Stay: Payer: Medicare Other | Admitting: Anesthesiology

## 2018-06-09 ENCOUNTER — Encounter
Admission: EM | Disposition: A | Discharge status: Skilled Nursing Facility | Payer: Self-pay | Source: Home / Self Care | Attending: Specialist

## 2018-06-09 ENCOUNTER — Inpatient Hospital Stay: Payer: Medicare Other

## 2018-06-09 DIAGNOSIS — S72142A Displaced intertrochanteric fracture of left femur, initial encounter for closed fracture: Secondary | ICD-10-CM | POA: Diagnosis not present

## 2018-06-09 HISTORY — PX: INTRAMEDULLARY (IM) NAIL INTERTROCHANTERIC: SHX5875

## 2018-06-09 LAB — BASIC METABOLIC PANEL
Anion gap: 9 (ref 5–15)
BUN: 33 mg/dL — ABNORMAL HIGH (ref 8–23)
CALCIUM: 8.6 mg/dL — AB (ref 8.9–10.3)
CO2: 19 mmol/L — ABNORMAL LOW (ref 22–32)
Chloride: 105 mmol/L (ref 98–111)
Creatinine, Ser: 0.97 mg/dL (ref 0.44–1.00)
GFR calc Af Amer: >60 mL/min (ref >60)
GFR calc non Af Amer: 58 mL/min — ABNORMAL LOW (ref >60)
Glucose, Bld: 149 mg/dL — ABNORMAL HIGH (ref 70–99)
Potassium: 4.4 mmol/L (ref 3.5–5.1)
Sodium: 133 mmol/L — ABNORMAL LOW (ref 135–145)

## 2018-06-09 LAB — CBC
HCT: 34 % — ABNORMAL LOW (ref 36.0–46.0)
Hemoglobin: 11.2 g/dL — ABNORMAL LOW (ref 12.0–15.0)
MCH: 32.8 pg (ref 26.0–34.0)
MCHC: 32.9 g/dL (ref 30.0–36.0)
MCV: 99.7 fL (ref 80.0–100.0)
Platelets: 252 10*3/uL (ref 150–400)
RBC: 3.41 MIL/uL — ABNORMAL LOW (ref 3.87–5.11)
RDW: 12.7 % (ref 11.5–15.5)
WBC: 11.9 10*3/uL — ABNORMAL HIGH (ref 4.0–10.5)
nRBC: 0 % (ref 0.0–0.2)

## 2018-06-09 LAB — PROTIME-INR
INR: 1.3
PROTHROMBIN TIME: 16.1 s — AB (ref 11.4–15.2)

## 2018-06-09 SURGERY — FIXATION, FRACTURE, INTERTROCHANTERIC, WITH INTRAMEDULLARY ROD
Anesthesia: General | Laterality: Left

## 2018-06-09 MED ORDER — FENTANYL CITRATE (PF) 100 MCG/2ML IJ SOLN
INTRAMUSCULAR | Status: AC
  Filled 2018-06-09: qty 2

## 2018-06-09 MED ORDER — ONDANSETRON HCL 4 MG/2ML IJ SOLN
INTRAMUSCULAR | Status: AC
  Filled 2018-06-09: qty 2

## 2018-06-09 MED ORDER — FENTANYL CITRATE (PF) 100 MCG/2ML IJ SOLN
INTRAMUSCULAR | Status: DC | PRN
  Administered 2018-06-09: 25 ug via INTRAVENOUS
  Administered 2018-06-09: 100 ug via INTRAVENOUS
  Administered 2018-06-09: 25 ug via INTRAVENOUS
  Administered 2018-06-09: 50 ug via INTRAVENOUS

## 2018-06-09 MED ORDER — DOCUSATE SODIUM 100 MG PO CAPS
100.0000 mg | ORAL_CAPSULE | Freq: Two times a day (BID) | ORAL | Status: DC
  Administered 2018-06-09 – 2018-06-11 (×5): 100 mg via ORAL
  Filled 2018-06-09 (×5): qty 1

## 2018-06-09 MED ORDER — FENTANYL CITRATE (PF) 100 MCG/2ML IJ SOLN: 25.0000 ug | INTRAMUSCULAR | Status: DC | PRN

## 2018-06-09 MED ORDER — HYDROCODONE-ACETAMINOPHEN 7.5-325 MG PO TABS
1.0000 | ORAL_TABLET | ORAL | Status: DC | PRN
  Administered 2018-06-10 – 2018-06-11 (×2): 1 via ORAL
  Filled 2018-06-09 (×2): qty 1

## 2018-06-09 MED ORDER — ONDANSETRON HCL 4 MG/2ML IJ SOLN: 4.0000 mg | Freq: Once | INTRAMUSCULAR | Status: DC | PRN

## 2018-06-09 MED ORDER — SUGAMMADEX SODIUM 200 MG/2ML IV SOLN
INTRAVENOUS | Status: AC
  Filled 2018-06-09: qty 2

## 2018-06-09 MED ORDER — DEXAMETHASONE SODIUM PHOSPHATE 10 MG/ML IJ SOLN
INTRAMUSCULAR | Status: AC
  Filled 2018-06-09: qty 1

## 2018-06-09 MED ORDER — ONDANSETRON HCL 4 MG/2ML IJ SOLN: 4.0000 mg | Freq: Four times a day (QID) | INTRAMUSCULAR | Status: DC | PRN

## 2018-06-09 MED ORDER — WARFARIN - PHARMACIST DOSING INPATIENT: Freq: Every day | Status: DC

## 2018-06-09 MED ORDER — PROPOFOL 10 MG/ML IV BOLUS
INTRAVENOUS | Status: AC
  Filled 2018-06-09: qty 20

## 2018-06-09 MED ORDER — LIDOCAINE HCL (CARDIAC) PF 100 MG/5ML IV SOSY
PREFILLED_SYRINGE | INTRAVENOUS | Status: DC | PRN
  Administered 2018-06-09: 60 mg via INTRAVENOUS
  Administered 2018-06-09: 40 mg via INTRAVENOUS

## 2018-06-09 MED ORDER — NEOMYCIN-POLYMYXIN B GU 40-200000 IR SOLN
Status: AC
  Filled 2018-06-09: qty 4

## 2018-06-09 MED ORDER — MORPHINE SULFATE (PF) 2 MG/ML IV SOLN: 1.0000 mg | INTRAVENOUS | Status: DC | PRN

## 2018-06-09 MED ORDER — VANCOMYCIN HCL IN DEXTROSE 1-5 GM/200ML-% IV SOLN
1000.0000 mg | Freq: Two times a day (BID) | INTRAVENOUS | Status: AC
  Administered 2018-06-09: 1000 mg via INTRAVENOUS
  Filled 2018-06-09: qty 200

## 2018-06-09 MED ORDER — MAGNESIUM CITRATE PO SOLN
1.0000 | Freq: Once | ORAL | Status: DC | PRN
  Filled 2018-06-09: qty 296

## 2018-06-09 MED ORDER — ROCURONIUM BROMIDE 50 MG/5ML IV SOLN
INTRAVENOUS | Status: AC
  Filled 2018-06-09: qty 1

## 2018-06-09 MED ORDER — METHOCARBAMOL 1000 MG/10ML IJ SOLN: 500.0000 mg | Freq: Four times a day (QID) | INTRAVENOUS | Status: DC | PRN

## 2018-06-09 MED ORDER — PHENYLEPHRINE HCL 10 MG/ML IJ SOLN
INTRAMUSCULAR | Status: DC | PRN
  Administered 2018-06-09 (×5): 100 ug via INTRAVENOUS

## 2018-06-09 MED ORDER — METHOCARBAMOL 500 MG PO TABS
500.0000 mg | ORAL_TABLET | Freq: Four times a day (QID) | ORAL | Status: DC | PRN
  Administered 2018-06-11: 500 mg via ORAL
  Filled 2018-06-09: qty 1

## 2018-06-09 MED ORDER — WARFARIN SODIUM 7.5 MG PO TABS
7.5000 mg | ORAL_TABLET | Freq: Once | ORAL | Status: DC
  Filled 2018-06-09: qty 1

## 2018-06-09 MED ORDER — SUCCINYLCHOLINE CHLORIDE 20 MG/ML IJ SOLN
INTRAMUSCULAR | Status: DC | PRN
  Administered 2018-06-09: 100 mg via INTRAVENOUS

## 2018-06-09 MED ORDER — ROCURONIUM BROMIDE 100 MG/10ML IV SOLN
INTRAVENOUS | Status: DC | PRN
  Administered 2018-06-09: 5 mg via INTRAVENOUS
  Administered 2018-06-09: 45 mg via INTRAVENOUS

## 2018-06-09 MED ORDER — SUGAMMADEX SODIUM 200 MG/2ML IV SOLN
INTRAVENOUS | Status: DC | PRN
  Administered 2018-06-09: 150 mg via INTRAVENOUS

## 2018-06-09 MED ORDER — ACETAMINOPHEN 500 MG PO TABS
500.0000 mg | ORAL_TABLET | Freq: Four times a day (QID) | ORAL | Status: AC
  Administered 2018-06-09 – 2018-06-10 (×3): 500 mg via ORAL
  Filled 2018-06-09 (×3): qty 1

## 2018-06-09 MED ORDER — METHOCARBAMOL 1000 MG/10ML IJ SOLN
500.0000 mg | Freq: Four times a day (QID) | INTRAVENOUS | Status: DC | PRN
  Filled 2018-06-09: qty 5

## 2018-06-09 MED ORDER — ONDANSETRON HCL 4 MG/2ML IJ SOLN
INTRAMUSCULAR | Status: DC | PRN
  Administered 2018-06-09: 4 mg via INTRAVENOUS

## 2018-06-09 MED ORDER — BISACODYL 10 MG RE SUPP: 10.0000 mg | Freq: Every day | RECTAL | Status: DC | PRN

## 2018-06-09 MED ORDER — KETOROLAC TROMETHAMINE 15 MG/ML IJ SOLN
7.5000 mg | Freq: Four times a day (QID) | INTRAMUSCULAR | Status: AC
  Administered 2018-06-09 – 2018-06-10 (×3): 7.5 mg via INTRAVENOUS
  Filled 2018-06-09 (×3): qty 1

## 2018-06-09 MED ORDER — HYDROCODONE-ACETAMINOPHEN 5-325 MG PO TABS: 1.0000 | ORAL_TABLET | ORAL | Status: DC | PRN

## 2018-06-09 MED ORDER — DEXAMETHASONE SODIUM PHOSPHATE 10 MG/ML IJ SOLN
INTRAMUSCULAR | Status: DC | PRN
  Administered 2018-06-09: 5 mg via INTRAVENOUS

## 2018-06-09 MED ORDER — ONDANSETRON HCL 4 MG PO TABS: 4.0000 mg | ORAL_TABLET | Freq: Four times a day (QID) | ORAL | Status: DC | PRN

## 2018-06-09 MED ORDER — PHENOL 1.4 % MT LIQD
1.0000 | OROMUCOSAL | Status: DC | PRN
  Filled 2018-06-09: qty 177

## 2018-06-09 MED ORDER — ACETAMINOPHEN 10 MG/ML IV SOLN
INTRAVENOUS | Status: AC
  Filled 2018-06-09: qty 100

## 2018-06-09 MED ORDER — SEVOFLURANE IN SOLN
RESPIRATORY_TRACT | Status: AC
  Filled 2018-06-09: qty 250

## 2018-06-09 MED ORDER — MENTHOL 3 MG MT LOZG
1.0000 | LOZENGE | OROMUCOSAL | Status: DC | PRN
  Filled 2018-06-09: qty 9

## 2018-06-09 MED ORDER — ACETAMINOPHEN 10 MG/ML IV SOLN
INTRAVENOUS | Status: DC | PRN
  Administered 2018-06-09: 1000 mg via INTRAVENOUS

## 2018-06-09 MED ORDER — POTASSIUM CHLORIDE IN NACL 20-0.9 MEQ/L-% IV SOLN
INTRAVENOUS | Status: DC
  Administered 2018-06-09: 21:00:00 via INTRAVENOUS
  Filled 2018-06-09 (×3): qty 1000

## 2018-06-09 MED ORDER — ALUM & MAG HYDROXIDE-SIMETH 200-200-20 MG/5ML PO SUSP: 30.0000 mL | ORAL | Status: DC | PRN

## 2018-06-09 MED ORDER — LACTATED RINGERS IV SOLN
INTRAVENOUS | Status: DC | PRN
  Administered 2018-06-09: 10:00:00 via INTRAVENOUS

## 2018-06-09 MED ORDER — METHOCARBAMOL 500 MG PO TABS: 500.0000 mg | ORAL_TABLET | Freq: Four times a day (QID) | ORAL | Status: DC | PRN

## 2018-06-09 MED ORDER — LIDOCAINE HCL (PF) 2 % IJ SOLN
INTRAMUSCULAR | Status: AC
  Filled 2018-06-09: qty 10

## 2018-06-09 SURGICAL SUPPLY — 39 items
BIT DRILL 4.3MMS DISTAL GRDTED (BIT) ×1 IMPLANT
BNDG COHESIVE 6X5 TAN STRL LF (GAUZE/BANDAGES/DRESSINGS) ×4 IMPLANT
CANISTER SUCT 1200ML W/VALVE (MISCELLANEOUS) ×2 IMPLANT
COVER WAND RF STERILE (DRAPES) ×2 IMPLANT
DRAPE SHEET LG 3/4 BI-LAMINATE (DRAPES) ×4 IMPLANT
DRAPE SURG 17X11 SM STRL (DRAPES) ×4 IMPLANT
DRAPE U-SHAPE 47X51 STRL (DRAPES) ×2 IMPLANT
DRILL 4.3MMS DISTAL GRADUATED (BIT) ×2
DRSG OPSITE POSTOP 4X14 (GAUZE/BANDAGES/DRESSINGS) ×2 IMPLANT
DURAPREP 26ML APPLICATOR (WOUND CARE) ×4 IMPLANT
ELECT REM PT RETURN 9FT ADLT (ELECTROSURGICAL) ×2
ELECTRODE REM PT RTRN 9FT ADLT (ELECTROSURGICAL) ×1 IMPLANT
GLOVE BIOGEL PI IND STRL 9 (GLOVE) ×1 IMPLANT
GLOVE BIOGEL PI INDICATOR 9 (GLOVE) ×1
GLOVE SURG 9.0 ORTHO LTXF (GLOVE) ×4 IMPLANT
GOWN STRL REUS TWL 2XL XL LVL4 (GOWN DISPOSABLE) ×2 IMPLANT
GOWN STRL REUS W/ TWL LRG LVL3 (GOWN DISPOSABLE) ×1 IMPLANT
GOWN STRL REUS W/TWL LRG LVL3 (GOWN DISPOSABLE) ×1
GUIDEPIN VERSANAIL DSP 3.2X444 (ORTHOPEDIC DISPOSABLE SUPPLIES) ×2 IMPLANT
GUIDEWIRE BALL NOSE 80CM (WIRE) ×2 IMPLANT
HEMOVAC 400CC 10FR (MISCELLANEOUS) IMPLANT
HFN LH 130 DEG 9MM X 360MM (Nail) ×2 IMPLANT
KIT TURNOVER CYSTO (KITS) ×2 IMPLANT
MAT ABSORB  FLUID 56X50 GRAY (MISCELLANEOUS) ×1
MAT ABSORB FLUID 56X50 GRAY (MISCELLANEOUS) ×1 IMPLANT
NS IRRIG 1000ML POUR BTL (IV SOLUTION) ×2 IMPLANT
PACK HIP COMPR (MISCELLANEOUS) ×2 IMPLANT
SCREW BONE CORTICAL 5.0X38 (Screw) ×2 IMPLANT
SCREW BONE CORTICAL 5.0X40 (Screw) ×2 IMPLANT
SCREW LAG 10.5MMX105MM HFN (Screw) ×2 IMPLANT
SCREW LAG HIP NAIL 10.5X95 (Screw) ×2 IMPLANT
STAPLER SKIN PROX 35W (STAPLE) ×2 IMPLANT
SUCTION FRAZIER HANDLE 10FR (MISCELLANEOUS)
SUCTION TUBE FRAZIER 10FR DISP (MISCELLANEOUS) IMPLANT
SUT VIC AB 0 CT1 36 (SUTURE) ×4 IMPLANT
SUT VIC AB 2-0 CT1 27 (SUTURE) ×1
SUT VIC AB 2-0 CT1 TAPERPNT 27 (SUTURE) ×1 IMPLANT
SUT VICRYL 0 AB UR-6 (SUTURE) ×2 IMPLANT
SYR 30ML LL (SYRINGE) ×2 IMPLANT

## 2018-06-09 NOTE — Anesthesia Post-op Follow-up Note (Signed)
Anesthesia QCDR form completed.        

## 2018-06-09 NOTE — Progress Notes (Signed)
Subjective:  Patient reports left hip pain as mild at rest.  Patient's family is at the bedside.  Left hip marked according the hospital's correct site of surgery protocol.  No other acute issues with the patient preop.  INR is 1.3.  Objective:   VITALS:   Vitals:   06/08/18 1500 06/08/18 1537 06/08/18 2358 06/09/18 0749  BP: 122/77 119/77 (!) 123/93 111/83  Pulse: (!) 103 (!) 110 (!) 122 (!) 109  Resp: 12 18 18    Temp:  98.6 F (37 C) 98.4 F (36.9 C) 99 F (37.2 C)  TempSrc:  Oral Oral Oral  SpO2: 96% 95% 97% 97%  Weight:        PHYSICAL EXAM: Left lower extremity: Neurovascular intact Sensation intact distally Intact pulses distally Dorsiflexion/Plantar flexion intact No cellulitis present Compartment soft  LABS  Results for orders placed or performed during the hospital encounter of 06/08/18 (from the past 24 hour(s))  CBC     Status: Abnormal   Collection Time: 06/08/18 12:38 PM  Result Value Ref Range   WBC 9.1 4.0 - 10.5 K/uL   RBC 3.86 (L) 3.87 - 5.11 MIL/uL   Hemoglobin 12.6 12.0 - 15.0 g/dL   HCT 38.6 36.0 - 46.0 %   MCV 100.0 80.0 - 100.0 fL   MCH 32.6 26.0 - 34.0 pg   MCHC 32.6 30.0 - 36.0 g/dL   RDW 12.9 11.5 - 15.5 %   Platelets 277 150 - 400 K/uL   nRBC 0.0 0.0 - 0.2 %  Basic metabolic panel     Status: Abnormal   Collection Time: 06/08/18 12:38 PM  Result Value Ref Range   Sodium 137 135 - 145 mmol/L   Potassium 4.9 3.5 - 5.1 mmol/L   Chloride 107 98 - 111 mmol/L   CO2 21 (L) 22 - 32 mmol/L   Glucose, Bld 135 (H) 70 - 99 mg/dL   BUN 36 (H) 8 - 23 mg/dL   Creatinine, Ser 1.28 (H) 0.44 - 1.00 mg/dL   Calcium 9.1 8.9 - 10.3 mg/dL   GFR calc non Af Amer 41 (L) >60 mL/min   GFR calc Af Amer 48 (L) >60 mL/min   Anion gap 9 5 - 15  Protime-INR     Status: Abnormal   Collection Time: 06/08/18 12:38 PM  Result Value Ref Range   Prothrombin Time 27.3 (H) 11.4 - 15.2 seconds   INR 2.58   Type and screen     Status: None   Collection Time:  06/08/18  1:56 PM  Result Value Ref Range   ABO/RH(D) A NEG    Antibody Screen NEG    Sample Expiration      06/11/2018 Performed at [facility], [address] [place],  [address]   Protime-INR     Status: Abnormal   Collection Time: 06/09/18  5:53 AM  Result Value Ref Range   Prothrombin Time 16.1 (H) 11.4 - 15.2 seconds   INR 0.98   Basic metabolic panel     Status: Abnormal   Collection Time: 06/09/18  5:53 AM  Result Value Ref Range   Sodium 133 (L) 135 - 145 mmol/L   Potassium 4.4 3.5 - 5.1 mmol/L   Chloride 105 98 - 111 mmol/L   CO2 19 (L) 22 - 32 mmol/L   Glucose, Bld 149 (H) 70 - 99 mg/dL   BUN 33 (H) 8 - 23 mg/dL   Creatinine, Ser 0.97 0.44 - 1.00 mg/dL  Calcium 8.6 (L) 8.9 - 10.3 mg/dL   GFR calc non Af Amer 58 (L) >60 mL/min   GFR calc Af Amer >60 >60 mL/min   Anion gap 9 5 - 15  CBC     Status: Abnormal   Collection Time: 06/09/18  5:53 AM  Result Value Ref Range   WBC 11.9 (H) 4.0 - 10.5 K/uL   RBC 3.41 (L) 3.87 - 5.11 MIL/uL   Hemoglobin 11.2 (L) 12.0 - 15.0 g/dL   HCT 34.0 (L) 36.0 - 46.0 %   MCV 99.7 80.0 - 100.0 fL   MCH 32.8 26.0 - 34.0 pg   MCHC 32.9 30.0 - 36.0 g/dL   RDW 12.7 11.5 - 15.5 %   Platelets 252 150 - 400 K/uL   nRBC 0.0 0.0 - 0.2 %    Dg Chest 1 View  Result Date: 06/08/2018 CLINICAL DATA:  Fall today. Left hip fracture. Hx of A-fib, CHF, HTN. Nonsmoker. EXAM: CHEST  1 VIEW COMPARISON:  05/25/2011 FINDINGS: Stable linear scarring laterally at the left lung base. Lungs otherwise clear. Mild cardiomegaly. No effusion. Visualized bones unremarkable. IMPRESSION: 1. mild cardiomegaly. No acute cardiopulmonary findings. Electronically Signed   By: Lucrezia Europe M.D.   On: 06/08/2018 13:28   Dg Hip Unilat With Pelvis 2-3 Views Left  Result Date: 06/08/2018 CLINICAL DATA:  Left hip pain after fall today. Left leg shortened and externally rotated. No previous injury or surgery to left hip. EXAM: DG HIP (WITH OR WITHOUT PELVIS)  2-3V LEFT COMPARISON:  CT 12/29/2014 FINDINGS: Comminuted intertrochanteric intertrochanteric fracture of the left femur. The lesser trochanter is a separate fragment, distracted approximately 9 mm. No dislocation. Bony pelvis intact. Spondylitic changes in the visualized lower lumbar spine IMPRESSION: Comminuted intertrochanteric fracture, left femur. Electronically Signed   By: Lucrezia Europe M.D.   On: 06/08/2018 13:27    Assessment/Plan: Day of Surgery   Active Problems:   Closed left hip fracture [facility])   Patient is stable.  She has been n.p.o.  She has been cleared by the medical service for surgery.  Her INR today is 1.3.  Plan for surgery this morning for intramedullary fixation of left comminuted four-part intertrochanteric hip fracture.  The risks and benefits have been explained and documented.   Thornton Park , MD 06/09/2018, 9:53 AM

## 2018-06-09 NOTE — Progress Notes (Signed)
[place] [facility] [place] NAME: Natalie Gardner    MR#:  [id number]  DATE OF BIRTH:  [birth year]  SUBJECTIVE:   Patient presented to the hospital after a mechanical fall noted to have a left hip fracture.  Status post intramedullary fixation of the left hip fracture.  Seen in the PACU postoperatively.  Denies any pain.  No other acute complaints presently.  REVIEW OF SYSTEMS:    Review of Systems  Constitutional: Negative for chills and fever.  HENT: Negative for congestion and tinnitus.   Eyes: Negative for blurred vision and double vision.  Respiratory: Negative for cough, shortness of breath and wheezing.   Cardiovascular: Negative for chest pain, orthopnea and PND.  Gastrointestinal: Negative for abdominal pain, diarrhea, nausea and vomiting.  Genitourinary: Negative for dysuria and hematuria.  Neurological: Negative for dizziness, sensory change and focal weakness.  All other systems reviewed and are negative.   Nutrition: Heart Healthy Tolerating Diet: Yes Tolerating PT: Await Eval.   DRUG ALLERGIES:   Allergies  Allergen Reactions  . Penicillins     Yeast infections Has patient had a PCN reaction causing immediate rash, facial/tongue/throat swelling, SOB or lightheadedness with hypotension: No Has patient had a PCN reaction causing severe rash involving mucus membranes or skin necrosis: No Has patient had a PCN reaction that required hospitalization: No Has patient had a PCN reaction occurring within the last 10 years: No If all of the above answers are "NO", then may proceed with Cephalosporin use.   Marland Kitchen Penicillin V Potassium Other (See Comments)    Yeast infection  . Protonix [Pantoprazole Sodium] Rash  . Sulfa Antibiotics     Yeast infections  Other reaction(s): Other (See Comments) Yeast infection    VITALS:  Blood pressure 121/81, pulse (!) 118, temperature 98.2 F (36.8 C), resp. rate 18, weight 77.1 kg, SpO2 98  %.  PHYSICAL EXAMINATION:   Physical Exam  GENERAL:  73 y.o.-year-old patient lying in bed in no acute distress.  EYES: Pupils equal, round, reactive to light and accommodation. No scleral icterus. Extraocular muscles intact.  HEENT: Head atraumatic, normocephalic. Oropharynx and nasopharynx clear.  NECK:  Supple, no jugular venous distention. No thyroid enlargement, no tenderness.  LUNGS: Normal breath sounds bilaterally, no wheezing, rales, rhonchi. No use of accessory muscles of respiration.  CARDIOVASCULAR: S1, S2 normal. No murmurs, rubs, or gallops.  ABDOMEN: Soft, nontender, nondistended. Bowel sounds present. No organomegaly or mass.  EXTREMITIES: No cyanosis, clubbing or edema b/l. Left hip dressing in place. No acute bleeding.    NEUROLOGIC: Cranial nerves II through XII are intact. No focal Motor or sensory deficits b/l.   PSYCHIATRIC: The patient is alert and oriented x 3.  SKIN: No obvious rash, lesion, or ulcer.    LABORATORY PANEL:   CBC Recent Labs  Lab 06/09/18 0553  WBC 11.9*  HGB 11.2*  HCT 34.0*  PLT 252   ------------------------------------------------------------------------------------------------------------------  Chemistries  Recent Labs  Lab 06/09/18 0553  NA 133*  K 4.4  CL 105  CO2 19*  GLUCOSE 149*  BUN 33*  CREATININE 0.97  CALCIUM 8.6*   ------------------------------------------------------------------------------------------------------------------  Cardiac Enzymes No results for input(s): TROPONINI in the last 168 hours. ------------------------------------------------------------------------------------------------------------------  RADIOLOGY:  Dg Chest 1 View  Result Date: 06/08/2018 CLINICAL DATA:  Fall today. Left hip fracture. Hx of A-fib, CHF, HTN. Nonsmoker. EXAM: CHEST  1 VIEW COMPARISON:  05/25/2011 FINDINGS: Stable linear scarring laterally at the left lung  base. Lungs otherwise clear. Mild cardiomegaly. No effusion.  Visualized bones unremarkable. IMPRESSION: 1. mild cardiomegaly. No acute cardiopulmonary findings. Electronically Signed   By: Lucrezia Europe M.D.   On: 06/08/2018 13:28   Dg Hip Unilat With Pelvis 2-3 Views Left  Result Date: 06/08/2018 CLINICAL DATA:  Left hip pain after fall today. Left leg shortened and externally rotated. No previous injury or surgery to left hip. EXAM: DG HIP (WITH OR WITHOUT PELVIS) 2-3V LEFT COMPARISON:  CT 12/29/2014 FINDINGS: Comminuted intertrochanteric intertrochanteric fracture of the left femur. The lesser trochanter is a separate fragment, distracted approximately 9 mm. No dislocation. Bony pelvis intact. Spondylitic changes in the visualized lower lumbar spine IMPRESSION: Comminuted intertrochanteric fracture, left femur. Electronically Signed   By: Lucrezia Europe M.D.   On: 06/08/2018 13:27     ASSESSMENT AND PLAN:   Natalie Gardner  is a 73 y.o. female with a known history of hypertension, A. fib on Coumadin, GERD, depression, CKD presents to hospital secondary to a fall and left hip pain.  1. Left hip fracture-secondary to mechanical fall and left intertrochanteric fracture. -Seen by orthopedics and status post left hip intramedullary fixation today. -Continue pain control as per orthopedics.  Await physical therapy evaluation.  2.  Atrial fibrillation-has permanent nonvalvular A. Fib. -Rate controlled.  Continue metoprolol, Cardizem.  Patient was on Coumadin but INR reversed due to surgery. -Discussed with orthopedics can resume Coumadin again tonight and notified pharmacy.   3.  CKD stage III-seems to be stable at this time.  Continue to monitor.   4.  GERD-continue pepcid.   5.  Depression-on Effexor   All the records are reviewed and case discussed with Care Management/Social Worker. Management plans discussed with the patient, family and they are in agreement.  CODE STATUS: Full code  DVT Prophylaxis: Coumadin  TOTAL TIME TAKING CARE OF THIS  PATIENT: 30 minutes.   POSSIBLE D/C IN 2-3 DAYS, DEPENDING ON CLINICAL CONDITION.   Henreitta Leber M.D on 06/09/2018 at 1:32 PM  Between 7am to 6pm - Pager - [phone]  After 6pm go to [email or web address] - Proofreader  [facility]  Office  [phone]  CC: Primary care physician; Guadalupe Maple, MD

## 2018-06-09 NOTE — Transfer of Care (Signed)
Immediate Anesthesia Transfer of Care Note  Patient: Natalie Gardner  Procedure(s) Performed: INTRAMEDULLARY (IM) NAIL INTERTROCHANTRIC (Left )  Patient Location: PACU  Anesthesia Type:General  Level of Consciousness: drowsy and patient cooperative  Airway & Oxygen Therapy: Patient Spontanous Breathing and Patient connected to face mask oxygen  Post-op Assessment: Report given to RN and Post -op Vital signs reviewed and stable  Post vital signs: Reviewed and stable  Last Vitals:  Vitals Value Taken Time  BP 128/89 06/09/2018 12:00 PM  Temp 36.8 C 06/09/2018 12:00 PM  Pulse 102 06/09/2018 12:01 PM  Resp 18 06/09/2018 12:01 PM  SpO2 100 % 06/09/2018 12:01 PM  Vitals shown include unvalidated device data.  Last Pain:  Vitals:   06/09/18 0749  TempSrc: Oral  PainSc:          Complications: No apparent anesthesia complications

## 2018-06-09 NOTE — Progress Notes (Signed)
Report called to OR charge nurse. Vanc on call to OR given to transporter.

## 2018-06-09 NOTE — Anesthesia Postprocedure Evaluation (Signed)
Anesthesia Post Note  Patient: Natalie Gardner  Procedure(s) Performed: INTRAMEDULLARY (IM) NAIL INTERTROCHANTRIC (Left )  Patient location during evaluation: PACU Anesthesia Type: General Level of consciousness: awake and alert Pain management: pain level controlled Vital Signs Assessment: post-procedure vital signs reviewed and stable Respiratory status: spontaneous breathing and respiratory function stable Cardiovascular status: stable Anesthetic complications: no     Last Vitals:  Vitals:   06/09/18 1345 06/09/18 1446  BP: (!) 109/51 109/74  Pulse: 70 (!) 127  Resp:    Temp:  37.2 C  SpO2: 95% 96%    Last Pain:  Vitals:   06/09/18 1446  TempSrc: Oral  PainSc:                  [name]

## 2018-06-09 NOTE — Anesthesia Procedure Notes (Signed)
Procedure Name: Intubation Date/Time: 06/09/2018 9:53 AM Performed by: Jonna Clark, CRNA Pre-anesthesia Checklist: Patient identified, Patient being monitored, Timeout performed, Emergency Drugs available and Suction available Patient Re-evaluated:Patient Re-evaluated prior to induction Oxygen Delivery Method: Circle system utilized Preoxygenation: Pre-oxygenation with 100% oxygen Induction Type: IV induction Ventilation: Mask ventilation without difficulty Laryngoscope Size: Miller and 2 Grade View: Grade II Tube type: Oral Tube size: 7.0 mm Number of attempts: 1 Placement Confirmation: ETT inserted through vocal cords under direct vision,  positive ETCO2 and breath sounds checked- equal and bilateral Secured at: 21 cm Tube secured with: Tape Dental Injury: Teeth and Oropharynx as per pre-operative assessment

## 2018-06-09 NOTE — Op Note (Signed)
06/09/2018  12:16 PM  PATIENT:  Natalie Gardner    PRE-OPERATIVE DIAGNOSIS:  left intertrochanteric hip fracture with subtrochanteric extension  POST-OPERATIVE DIAGNOSIS:  Same  PROCEDURE:  INTRAMEDULLARY FIXATION OF LEFT COMMINUTED 4 PART INTERTROCHANTRIC hip fracture with subtrochanteric extension  SURGEON:  Thornton Park, MD  ANESTHESIA:   General  PREOPERATIVE INDICATIONS:  Natalie Gardner is a  73 y.o. female with a diagnosis of left hip comminuted four-part intertrochanteric hip fracture with subtrochanteric extension.  Given the fracture displacement and comminution it was recommended she undergo surgical fixation for this fracture.  The risks, benefits and alternatives were discussed with the patient and their family.  The risks include but are not limited to infection, bleeding, nerve or blood vessel injury, malunion, nonunion, hardware prominence, hardware failure, change in leg lengths or lower extremity rotation need for further surgery including hardware removal with conversion to a total hip arthroplasty. Medical risks include but are not limited to DVT and pulmonary embolism, myocardial infarction, stroke, pneumonia, respiratory failure and death. The patient and their family understood these risks and wished to proceed with surgery.  OPERATIVE PROCEDURE:  The patient was brought to the operating room and placed in the supine position on the fracture table. General anesthesia was administered.  A closed reduction was performed under C-arm guidance.  The fracture reduction was confirmed on both AP and lateral views. A time out was performed to verify the patient's name, date of birth, medical record number, correct site of surgery correct procedure to be performed. The timeout was also used to verify the patient received antibiotics and all appropriate instruments, implants and radiographic studies were available in the room. Once all in attendance were in agreement, the case  began. The patient was prepped and draped in a sterile fashion.  The patient received preoperative antibiotics with vancomycin given her allergy to penicillin.  Close reduction of the fracture was performed with longitudinal traction and internal rotation.  Once adequate reduction was achieved the case began.  An incision was made proximal to the greater trochanter in line with the femur. A guidewire was placed over the tip of the greater trochanter and advanced into the proximal femur to the level of the lesser trochanter.  Confirmation of the drill pin position was made on AP and lateral C-arm images.  The threaded guidepin was then overdrilled with the proximal femoral drill.  A ball-tipped guidewire was then advanced across the fracture and down the femoral shaft to the knee. Its position at both the hip and the knee was confirmed on C-arm imaging. A depth gauge was used to measure the length of the long nail to be used. It was measured to be 360 mm. The nail was then inserted into the proximal femur, across the fracture site and down the femoral shaft. Its position was confirmed on AP and lateral C-arm images.   Once the nail was completely seated, the drill guide for the lag screw was placed through the guide arm for the Affixus nail. A guidepin was then placed through this drill guide and advanced through the lateral cortex of the femur, across the fracture site and into the femoral head achieving a tip apex distance of less than 25 mm. The length of the drill pin was measured, and then the drill for the lag screw was advanced through the lateral cortex, across the fracture site and up into the femoral head to the depth of the lag screw..  The lag screw was then  advanced by hand into position across the fracture site into the femoral head. Its final position was confirmed on AP and lateral C-arm images. Compression was applied as traction was carefully released. The set screw in the top of the  intramedullary rod was tightened by hand using a screwdriver. It was backed off a quarter turn to allow for compression at the fracture site.  The attention was then turned to placement of the distal interlocking screws. A perfect circle technique was used. 2 small stab incisions were made over the distal interlocking screw holes. A free hand technique was used to drill both distal interlocking screws. The depth of the screws was measured with a depth gauge. The appropriate length screw was then advanced into position and tightened by hand. Final C-arm images of the entire intramedullary construct were taken in both the AP and lateral planes.   The wounds were irrigated copiously and closed with 0 Vicryl for closure of the deep fascia and 2-0 Vicryl for subcutaneous closure. The skin was approximated with staples. A dry sterile dressing was applied. I was scrubbed and present the entire case and all sharp, sponge and instrument counts were correct at the conclusion of the case. Patient was transferred to hospital bed and brought to PACU in stable condition. I left a message for the patient's family by phone to let them know the case was performed without complication and the patient was stable in the recovery room.     Timoteo Gaul, MD

## 2018-06-09 NOTE — Anesthesia Preprocedure Evaluation (Signed)
Anesthesia Evaluation  Patient identified by MRN, date of birth, ID band Patient awake    Reviewed: Allergy & Precautions, NPO status , Patient's Chart, lab work & pertinent test results  History of Anesthesia Complications Negative for: history of anesthetic complications  Airway Mallampati: III       Dental   Pulmonary neg sleep apnea, neg COPD,           Cardiovascular hypertension, Pt. on medications +CHF  + dysrhythmias Atrial Fibrillation + Valvular Problems/Murmurs      Neuro/Psych neg Seizures Depression    GI/Hepatic hiatal hernia, GERD  Medicated and Controlled,  Endo/Other  neg diabetes  Renal/GU Renal InsufficiencyRenal disease     Musculoskeletal   Abdominal   Peds  Hematology  (+) anemia ,   Anesthesia Other Findings   Reproductive/Obstetrics                             Anesthesia Physical Anesthesia Plan  ASA: III and emergent  Anesthesia Plan: General   Post-op Pain Management:    Induction: Intravenous  PONV Risk Score and Plan: 3  Airway Management Planned: Oral ETT  Additional Equipment:   Intra-op Plan:   Post-operative Plan:   Informed Consent: I have reviewed the patients History and Physical, chart, labs and discussed the procedure including the risks, benefits and alternatives for the proposed anesthesia with the patient or authorized representative who has indicated his/her understanding and acceptance.     Plan Discussed with:   Anesthesia Plan Comments:         Anesthesia Quick Evaluation

## 2018-06-09 NOTE — Progress Notes (Signed)
[place] for warfarin Indication: atrial fibrillation  Allergies  Allergen Reactions  . Penicillins     Yeast infections Has patient had a PCN reaction causing immediate rash, facial/tongue/throat swelling, SOB or lightheadedness with hypotension: No Has patient had a PCN reaction causing severe rash involving mucus membranes or skin necrosis: No Has patient had a PCN reaction that required hospitalization: No Has patient had a PCN reaction occurring within the last 10 years: No If all of the above answers are "NO", then may proceed with Cephalosporin use.   Marland Kitchen Penicillin V Potassium Other (See Comments)    Yeast infection  . Protonix [Pantoprazole Sodium] Rash  . Sulfa Antibiotics     Yeast infections  Other reaction(s): Other (See Comments) Yeast infection    Patient Measurements: Weight: 170 lb (77.1 kg)   Vital Signs: Temp: 98.2 F (36.8 C) (01/12 1200) Temp Source: Oral (01/12 0749) BP: 109/51 (01/12 1345) Pulse Rate: 70 (01/12 1345)  Labs: Recent Labs    06/08/18 1238 06/09/18 0553  HGB 12.6 11.2*  HCT 38.6 34.0*  PLT 277 252  LABPROT 27.3* 16.1*  INR 2.58 1.30  CREATININE 1.28* 0.97    Estimated Creatinine Clearance: 50.8 mL/min (by C-G formula based on SCr of 0.97 mg/dL).   Medical History: Past Medical History:  Diagnosis Date  . Anemia   . Aortic valve regurgitation   . Arthritis   . Atrial fibrillation ([place])   . Cancer ([place]) 12/2016   Basal Cell Skin Cancer  . Cardiomyopathy ([place])   . CHF (congestive heart failure) ([place])   . CKD (chronic kidney disease)   . Colitis   . Diverticulosis   . Dyspnea   . Dysrhythmia   . GERD (gastroesophageal reflux disease)   . GI bleed   . Heart murmur   . History of hiatal hernia   . Hypercholesterolemia   . Hyperlipidemia   . Hypertension   . Insomnia   . Osteopenia   . Pre-diabetes   . Urinary, incontinence, stress female   . Vertigo     Assessment: 73  year old female with h/o atrial fibrillation on warfarin PTA. Spoke with patient and confirmed home dose. She takes 5 mg daily, except on Wednesday when she takes 7.5 mg. Patient s/p intramedullary fixation of left hip fracture. Per consult from Dr. Mack Guise, okay to resume warfarin tonight. Also spoke with Dr. Verdell Carmine and we will not bridge with heparin.  Patient reports that she has been therapeutic on home regimen. She did present with a therapeutic INR 2.58, which was reversed for surgery.  Date INR Dose 1/12 1.3  Goal of Therapy:  INR 2-3 Monitor platelets by anticoagulation protocol: Yes   Plan:  Will order warfarin 7.5 mg PO x 1 tonight for subtherapeutic INR. INR ordered with morning labs.  Tawnya Crook, PharmD Pharmacy Resident  06/09/2018 2:50 PM

## 2018-06-09 NOTE — Progress Notes (Signed)
Incentive spirometer teaching done.  

## 2018-06-09 NOTE — Progress Notes (Signed)
15 minute call to floor. 

## 2018-06-10 ENCOUNTER — Encounter: Payer: Self-pay | Admitting: Orthopedic Surgery

## 2018-06-10 LAB — BASIC METABOLIC PANEL
Anion gap: 7 (ref 5–15)
BUN: 33 mg/dL — ABNORMAL HIGH (ref 8–23)
CO2: 21 mmol/L — AB (ref 22–32)
Calcium: 8 mg/dL — ABNORMAL LOW (ref 8.9–10.3)
Chloride: 102 mmol/L (ref 98–111)
Creatinine, Ser: 1.08 mg/dL — ABNORMAL HIGH (ref 0.44–1.00)
GFR calc Af Amer: 59 mL/min — ABNORMAL LOW (ref >60)
GFR calc non Af Amer: 51 mL/min — ABNORMAL LOW (ref >60)
GLUCOSE: 195 mg/dL — AB (ref 70–99)
Potassium: 5 mmol/L (ref 3.5–5.1)
Sodium: 130 mmol/L — ABNORMAL LOW (ref 135–145)

## 2018-06-10 LAB — PROTIME-INR
INR: 1.09
Prothrombin Time: 14 seconds (ref 11.4–15.2)

## 2018-06-10 LAB — CBC
HEMATOCRIT: 26.8 % — AB (ref 36.0–46.0)
Hemoglobin: 8.8 g/dL — ABNORMAL LOW (ref 12.0–15.0)
MCH: 32.7 pg (ref 26.0–34.0)
MCHC: 32.8 g/dL (ref 30.0–36.0)
MCV: 99.6 fL (ref 80.0–100.0)
Platelets: 185 10*3/uL (ref 150–400)
RBC: 2.69 MIL/uL — ABNORMAL LOW (ref 3.87–5.11)
RDW: 12.4 % (ref 11.5–15.5)
WBC: 13.5 10*3/uL — ABNORMAL HIGH (ref 4.0–10.5)
nRBC: 0 % (ref 0.0–0.2)

## 2018-06-10 MED ORDER — WARFARIN SODIUM 10 MG PO TABS
10.0000 mg | ORAL_TABLET | ORAL | Status: AC
  Administered 2018-06-10: 10 mg via ORAL
  Filled 2018-06-10 (×2): qty 1

## 2018-06-10 MED ORDER — ENOXAPARIN SODIUM 80 MG/0.8ML ~~LOC~~ SOLN
1.0000 mg/kg | Freq: Two times a day (BID) | SUBCUTANEOUS | Status: DC
  Administered 2018-06-10 – 2018-06-11 (×3): 75 mg via SUBCUTANEOUS
  Filled 2018-06-10 (×4): qty 0.8

## 2018-06-10 NOTE — Evaluation (Signed)
Physical Therapy Evaluation Patient Details Name: Natalie Gardner MRN: [id number] DOB: 01-Feb-1946 Today's Date: 06/10/2018   History of Present Illness   Pt is 73 y.o. female s/p IM nailing of L hip 06/09/2018, WBAT. PMH of HTN, A. fib on Coumadin, GERD, CHF, depression, CKD.  Clinical Impression  Patient alert, oriented, bahavior appropriate throughout session, no complaints of L hip pain at rest. Pt reported that she lives with her husband in a one story home, previously independent in IADLs/ADLs/ambulation, no AD.   Pt demonstrated therapeutic exercises with AAROM of L heel slides and SLR, verbal cues throughout for proper exercise technique. Pt was able to mobilize to EOB with minAx1 for LLE management and complete trunk elevation. Pt demonstrated sit <> stand transfers and ambulated ~50ft in room with RW and CGA, further mobility held due to pt request to finish breakfast. Pt needed verbal/visual cues for hand placement/AD management with good carryover. The patient demonstrated limitations (see "PT Problem List") that impede the patients ability to perform functional activities and would benefit from skilled PT intervention. Recommendation is HHPT.    Follow Up Recommendations Home health PT    Equipment Recommendations  Other (comment)(Pt may need RW, family member instructed to see if she still has RW at home.)    Recommendations for Other Services       Precautions / Restrictions Precautions Precautions: Fall Restrictions Weight Bearing Restrictions: Yes LLE Weight Bearing: Weight bearing as tolerated      Mobility  Bed Mobility Overal bed mobility: Needs Assistance Bed Mobility: Supine to Sit     Supine to sit: Min assist     General bed mobility comments: very little physical assist needed for LLE to slide across bed, complete trunk elevation  Transfers Overall transfer level: Needs assistance Equipment used: Rolling walker (2 wheeled) Transfers: Sit to/from  Stand Sit to Stand: Min guard         General transfer comment: Heavy use of UE. Verbal/visual cues for proper hand placement  Ambulation/Gait Ambulation/Gait assistance: Min guard Gait Distance (Feet): 6 Feet   Gait Pattern/deviations: Step-to pattern Gait velocity: decreased   General Gait Details: Antalgic step to gait pattern, verbal cues for sequencing, upright posture  Stairs            Wheelchair Mobility    Modified Rankin (Stroke Patients Only)       Balance Overall balance assessment: Needs assistance Sitting-balance support: Feet supported Sitting balance-Leahy Scale: Good     Standing balance support: No upper extremity supported Standing balance-Leahy Scale: Fair                               Pertinent Vitals/Pain Pain Assessment: No/denies pain(PT complained of mild pain after mobility, less than 3/10)    Home Living Family/patient expects to be discharged to:: Private residence Living Arrangements: Spouse/significant other Available Help at Discharge: Family;Available PRN/intermittently Type of Home: House Home Access: Stairs to enter Entrance Stairs-Rails: None Entrance Stairs-Number of Steps: 1 Home Layout: One level Home Equipment: Cane - single point Additional Comments: Pt unsure if she stay has her RW. will have family member check. Reported only 1 fall (that led to this admission) in the last 6 months    Prior Function Level of Independence: Independent         Comments: Does not use AD at baseline     Hand Dominance   Dominant Hand: Left    Extremity/Trunk  Assessment   Upper Extremity Assessment Upper Extremity Assessment: Overall WFL for tasks assessed    Lower Extremity Assessment Lower Extremity Assessment: RLE deficits/detail;LLE deficits/detail RLE Deficits / Details: grossly 4+/5 LLE Deficits / Details: unable to perform SLR independently LLE: Unable to fully assess due to pain    Cervical /  Trunk Assessment Cervical / Trunk Assessment: Normal  Communication   Communication: No difficulties  Cognition Arousal/Alertness: Awake/alert Behavior During Therapy: WFL for tasks assessed/performed Overall Cognitive Status: Within Functional Limits for tasks assessed                                        General Comments      Exercises Total Joint Exercises Quad Sets: AROM;Left;10 reps Short Arc Quad: AROM;Left;10 reps Heel Slides: AAROM;Left;10 reps Straight Leg Raises: AAROM;Left;10 reps   Assessment/Plan    PT Assessment Patient needs continued PT services  PT Problem List Decreased strength;Pain;Decreased range of motion;Decreased activity tolerance;Decreased knowledge of use of DME;Decreased balance;Decreased safety awareness;Decreased mobility;Decreased knowledge of precautions       PT Treatment Interventions DME instruction;Therapeutic exercise;Gait training;Balance training;Stair training;Neuromuscular re-education;Functional mobility training;Therapeutic activities;Patient/family education    PT Goals (Current goals can be found in the Care Plan section)  Acute Rehab PT Goals Patient Stated Goal: to get back to PLOF PT Goal Formulation: With patient Time For Goal Achievement: 06/24/18 Potential to Achieve Goals: Good    Frequency BID   Barriers to discharge        Co-evaluation               AM-PAC PT "6 Clicks" Mobility  Outcome Measure Help needed turning from your back to your side while in a flat bed without using bedrails?: A Little Help needed moving from lying on your back to sitting on the side of a flat bed without using bedrails?: A Little Help needed moving to and from a bed to a chair (including a wheelchair)?: A Little Help needed standing up from a chair using your arms (e.g., wheelchair or bedside chair)?: A Little Help needed to walk in hospital room?: A Little Help needed climbing 3-5 steps with a railing? : A  Little 6 Click Score: 18    End of Session Equipment Utilized During Treatment: Gait belt Activity Tolerance: Patient tolerated treatment well Patient left: with chair alarm set;in chair;with call bell/phone within reach;with family/visitor present Nurse Communication: Mobility status PT Visit Diagnosis: Unsteadiness on feet (R26.81);Difficulty in walking, not elsewhere classified (R26.2);Other abnormalities of gait and mobility (R26.89);Muscle weakness (generalized) (M62.81);History of falling (Z91.81);Pain Pain - Right/Left: Left Pain - part of body: Hip    Time: 8756-4332 PT Time Calculation (min) (ACUTE ONLY): 26 min   Charges:   PT Evaluation $PT Eval Low Complexity: 1 Low PT Treatments $Therapeutic Activity: 8-22 mins       Lieutenant Diego PT, DPT 10:00 AM,06/10/18 [phone]

## 2018-06-10 NOTE — Progress Notes (Signed)
Subjective:  POD #1 s/p left IM fixation for IT hip fracture.  Patient reports left hip pain as mild.  Patient without other complaints.  She states she feels well.  Objective:   VITALS:   Vitals:   06/09/18 1818 06/09/18 2239 06/10/18 0457 06/10/18 0821  BP: 95/60 108/75 (!) 103/56 109/69  Pulse: 84 (!) 120 74 95  Resp:  18 18 18   Temp: 98.8 F (37.1 C) 97.8 F (36.6 C) 98 F (36.7 C) 97.6 F (36.4 C)  TempSrc: Oral Oral Oral Oral  SpO2: 98% 100% 99%   Weight:        PHYSICAL EXAM: Left lower extremity: Neurovascular intact Sensation intact distally Dorsiflexion/Plantar flexion intact Incision: dressing C/D/I No cellulitis present Compartment soft  LABS  Results for orders placed or performed during the hospital encounter of 06/08/18 (from the past 24 hour(s))  CBC     Status: Abnormal   Collection Time: 06/10/18  2:49 AM  Result Value Ref Range   WBC 13.5 (H) 4.0 - 10.5 K/uL   RBC 2.69 (L) 3.87 - 5.11 MIL/uL   Hemoglobin 8.8 (L) 12.0 - 15.0 g/dL   HCT 26.8 (L) 36.0 - 46.0 %   MCV 99.6 80.0 - 100.0 fL   MCH 32.7 26.0 - 34.0 pg   MCHC 32.8 30.0 - 36.0 g/dL   RDW 12.4 11.5 - 15.5 %   Platelets 185 150 - 400 K/uL   nRBC 0.0 0.0 - 0.2 %  Basic metabolic panel     Status: Abnormal   Collection Time: 06/10/18  2:49 AM  Result Value Ref Range   Sodium 130 (L) 135 - 145 mmol/L   Potassium 5.0 3.5 - 5.1 mmol/L   Chloride 102 98 - 111 mmol/L   CO2 21 (L) 22 - 32 mmol/L   Glucose, Bld 195 (H) 70 - 99 mg/dL   BUN 33 (H) 8 - 23 mg/dL   Creatinine, Ser 1.08 (H) 0.44 - 1.00 mg/dL   Calcium 8.0 (L) 8.9 - 10.3 mg/dL   GFR calc non Af Amer 51 (L) >60 mL/min   GFR calc Af Amer 59 (L) >60 mL/min   Anion gap 7 5 - 15  Protime-INR     Status: None   Collection Time: 06/10/18  2:49 AM  Result Value Ref Range   Prothrombin Time 14.0 11.4 - 15.2 seconds   INR 1.09     Dg Chest 1 View  Result Date: 06/08/2018 CLINICAL DATA:  Fall today. Left hip fracture. Hx of A-fib,  CHF, HTN. Nonsmoker. EXAM: CHEST  1 VIEW COMPARISON:  05/25/2011 FINDINGS: Stable linear scarring laterally at the left lung base. Lungs otherwise clear. Mild cardiomegaly. No effusion. Visualized bones unremarkable. IMPRESSION: 1. mild cardiomegaly. No acute cardiopulmonary findings. Electronically Signed   By: Lucrezia Europe M.D.   On: 06/08/2018 13:28   Dg Hip Operative Unilat W Or W/o Pelvis Left  Result Date: 06/09/2018 CLINICAL DATA:  Left IM nail EXAM: OPERATIVE LEFT HIP (WITH PELVIS IF PERFORMED) 6 VIEWS TECHNIQUE: Fluoroscopic spot image(s) were submitted for interpretation post-operatively. FLUOROSCOPY TIME:  2 minutes 16 seconds COMPARISON:  Left hip radiographs dated 06/08/2018 FINDINGS: Intraoperative fluoroscopic radiographs during IM nail with dynamic hip screw fixation of an intertrochanteric left hip fracture. Two distal interlocking screws. Fracture fragments are in near anatomic alignment and position. IMPRESSION: Intraoperative fluoroscopic radiographs during ORIF of an intertrochanteric left hip fracture, as above. Electronically Signed   By: Julian Hy M.D.   On:  06/09/2018 20:35   Dg Hip Unilat With Pelvis 2-3 Views Left  Result Date: 06/08/2018 CLINICAL DATA:  Left hip pain after fall today. Left leg shortened and externally rotated. No previous injury or surgery to left hip. EXAM: DG HIP (WITH OR WITHOUT PELVIS) 2-3V LEFT COMPARISON:  CT 12/29/2014 FINDINGS: Comminuted intertrochanteric intertrochanteric fracture of the left femur. The lesser trochanter is a separate fragment, distracted approximately 9 mm. No dislocation. Bony pelvis intact. Spondylitic changes in the visualized lower lumbar spine IMPRESSION: Comminuted intertrochanteric fracture, left femur. Electronically Signed   By: Lucrezia Europe M.D.   On: 06/08/2018 13:27   Dg Femur Port Min 2 Views Left  Result Date: 06/09/2018 CLINICAL DATA:  Status post left IM nail EXAM: LEFT FEMUR PORTABLE 2 VIEWS COMPARISON:  Left  hip radiographs dated 06/08/2018 FINDINGS: IM nail with dynamic hip screw fixation. Two distal interlocking screws. Minimal residual distraction of the femoral shaft at the subtrochanteric region. Bilateral hip joint spaces are preserved. Visualized bony pelvis appears intact. Degenerative changes of the lower lumbar spine. Left knee arthroplasty, incompletely visualized. IMPRESSION: Status post ORIF of an intertrochanteric left hip fracture, as above. Electronically Signed   By: Julian Hy M.D.   On: 06/09/2018 20:40    Assessment/Plan: 1 Day Post-Op   Active Problems:   Closed left hip fracture [facility])  Patient getting up with physical therapy.  She is doing well.  Patient's hemoglobin and hematocrit have dropped but she does not require a transfusion at this point.  Patient has sodium of 130.  Will defer to hospitalist regarding correction.  Continue PT and current pain management.    Thornton Park , MD 06/10/2018, 8:50 AM

## 2018-06-10 NOTE — Progress Notes (Addendum)
[facility] for warfarin and enoxaparin  Indication: atrial fibrillation  Allergies  Allergen Reactions  . Penicillins     Yeast infections Has patient had a PCN reaction causing immediate rash, facial/tongue/throat swelling, SOB or lightheadedness with hypotension: No Has patient had a PCN reaction causing severe rash involving mucus membranes or skin necrosis: No Has patient had a PCN reaction that required hospitalization: No Has patient had a PCN reaction occurring within the last 10 years: No If all of the above answers are "NO", then may proceed with Cephalosporin use.   Marland Kitchen Penicillin V Potassium Other (See Comments)    Yeast infection  . Protonix [Pantoprazole Sodium] Rash  . Sulfa Antibiotics     Yeast infections  Other reaction(s): Other (See Comments) Yeast infection    Patient Measurements: Weight: 170 lb (77.1 kg)   Vital Signs: Temp: 97.6 F (36.4 C) (01/13 0821) Temp Source: Oral (01/13 0821) BP: 109/69 (01/13 0821) Pulse Rate: 95 (01/13 0821)  Labs: Recent Labs    06/08/18 1238 06/09/18 0553 06/10/18 0249  HGB 12.6 11.2* 8.8*  HCT 38.6 34.0* 26.8*  PLT 277 252 185  LABPROT 27.3* 16.1* 14.0  INR 2.58 1.30 1.09  CREATININE 1.28* 0.97 1.08*    Estimated Creatinine Clearance: 45.6 mL/min (A) (by C-G formula based on SCr of 1.08 mg/dL (H)).   Medical History: Past Medical History:  Diagnosis Date  . Anemia   . Aortic valve regurgitation   . Arthritis   . Atrial fibrillation ([place])   . Cancer ([place]) 12/2016   Basal Cell Skin Cancer  . Cardiomyopathy ([place])   . CHF (congestive heart failure) ([place])   . CKD (chronic kidney disease)   . Colitis   . Diverticulosis   . Dyspnea   . Dysrhythmia   . GERD (gastroesophageal reflux disease)   . GI bleed   . Heart murmur   . History of hiatal hernia   . Hypercholesterolemia   . Hyperlipidemia   . Hypertension   . Insomnia   . Osteopenia   . Pre-diabetes   .  Urinary, incontinence, stress female   . Vertigo     Assessment: 73 year old female with h/o atrial fibrillation on warfarin PTA. Spoke with patient and confirmed home dose. She takes 5 mg daily, except on Wednesday when she takes 7.5 mg. Patient s/p intramedullary fixation of left hip fracture. Patient received Vitamin K 10mg  IV prior to surgery. Per consult from Dr. Mack Guise, okay to resume warfarin tonight. Will bridge with enoxaparin.  Patient reports that she has been therapeutic on home regimen. She did present with a therapeutic INR 2.58, which was reversed for surgery.  Date INR Dose 1/12 1.3 Dose not given  1/13 1.09  Goal of Therapy:  INR 2-3 Monitor platelets by anticoagulation protocol: Yes   Plan:  1. Dose not given 1/12 according to [facility]. Will order warfarin 10 mg PO x 1  tonight for subtherapeutic INR. INR ordered with morning labs.  2. Start bridge therapy with Enoxaparin 1mg /kg (75mg ) SQ every 12 hours until INR therapeutic     Pernell Dupre, PharmD, BCPS Clinical Pharmacist 06/10/2018 10:15 AM

## 2018-06-10 NOTE — Evaluation (Signed)
Occupational Therapy Evaluation Patient Details Name: Natalie Gardner MRN: [id number] DOB: Nov 03, 1945 Today's Date: 06/10/2018    History of Present Illness  Pt is 73 y.o. female s/p IM nailing of L hip 06/09/2018, WBAT. PMH of HTN, A. fib on Coumadin, GERD, CHF, depression, CKD.   Clinical Impression   Pt seen for OT evaluation this date, POD#1 from above surgery. Pt was independent in all ADLs prior to surgery. Pt was active in her community, driving, and caring for her small dog. Pt is eager to return to PLOF with less pain and improved safety and independence. Pt currently requires minimal assist for LB dressing and bathing while in seated position due to pain and limited AROM of L hip. Pt instructed in self care skills, falls prevention strategies, home/routines modifications, DME/AE for LB bathing and dressing tasks, pet mgt, and compression stocking mgt strategies. Pt would benefit from additional instruction in self care skills and techniques to help maintain safety with or without assistive devices to support recall and carryover prior to discharge. Recommend *HHOT upon discharge.         Follow Up Recommendations  Home health OT    Equipment Recommendations  Other (comment)(TBD at next venue of care. )    Recommendations for Other Services       Precautions / Restrictions Precautions Precautions: Fall Restrictions Weight Bearing Restrictions: Yes LLE Weight Bearing: Weight bearing as tolerated      Mobility Bed Mobility Overal bed mobility: Needs Assistance Bed Mobility: Supine to Sit     Supine to sit: Min assist     General bed mobility comments: Defer to PT evaluation. Pt seated upright in chair at beginning/end of session.  Transfers Overall transfer level: Needs assistance Equipment used: Rolling walker (2 wheeled) Transfers: Sit to/from Stand Sit to Stand: Min guard         General transfer comment: Pt reports that she found ambulating with PT  somewhat difficult, but she is eager to return to baseline function. Provided reinforcement on proper hand placement when moving from sit<>stand while using RW.     Balance Overall balance assessment: Needs assistance Sitting-balance support: Feet supported Sitting balance-Leahy Scale: Good Sitting balance - Comments: Pt able to maintain upright seated balance without any difficulty during this evaluation. Steady reaching outside of BOS with feet supported.    Standing balance support: No upper extremity supported Standing balance-Leahy Scale: Fair Standing balance comment: Pt seated throughout session. Defer to PT.                            ADL either performed or assessed with clinical judgement   ADL Overall ADL's : Needs assistance/impaired                                       General ADL Comments: Overall, Pt would require supervision to min-a for safety as she completes LB bathing/dressing, TED Hose mgmt, etc. Pt reported feeling near baseline for most ADL/IADL. However, she recognizes need to make certain changes to her usual routine such as using RW for functional mobility as her hip heals. She is eager to get back to her baseline level of independence and family is eager to support this goal.      Vision Baseline Vision/History: Wears glasses Wears Glasses: At all times Patient Visual Report: No change from baseline  Perception     Praxis      Pertinent Vitals/Pain Pain Assessment: No/denies pain(Pt reports her operative hip is "sore but not painful". )     Hand Dominance Left   Extremity/Trunk Assessment Upper Extremity Assessment Upper Extremity Assessment: Overall WFL for tasks assessed   Lower Extremity Assessment Lower Extremity Assessment: Defer to PT evaluation RLE Deficits / Details: grossly 4+/5 LLE Deficits / Details: unable to perform SLR independently LLE: Unable to fully assess due to pain   Cervical / Trunk  Assessment Cervical / Trunk Assessment: Normal   Communication Communication Communication: No difficulties   Cognition Arousal/Alertness: Awake/alert Behavior During Therapy: WFL for tasks assessed/performed Overall Cognitive Status: Within Functional Limits for tasks assessed                                     General Comments  Pt endorses feeling "very good" after her surgery with little to no pain. She stated that she feels confident with ADL tasks and has gone through a similar recovery process after having knee surgery several years ago.     Exercises Total Joint Exercises Quad Sets: AROM;Left;10 reps Short Arc Quad: AROM;Left;10 reps Heel Slides: AAROM;Left;10 reps Straight Leg Raises: AAROM;Left;10 reps Other Exercises Other Exercises: Instruced pt and pt daughter on safe use of AE/DME, falls prevention strategies, compression stocking mgmt, pet mgmt strategies upon returning home, and safe mobilities practices while using RW in order to increase safety and decrease risk of falls.    Shoulder Instructions      Home Living Family/patient expects to be discharged to:: Private residence Living Arrangements: Spouse/significant other Available Help at Discharge: Family;Available PRN/intermittently Type of Home: House Home Access: Stairs to enter [employer] of Steps: 1 Entrance Stairs-Rails: None Home Layout: One level     Bathroom Shower/Tub: Walk-in shower;Tub/shower unit(Pt able to use either tub shower or walk-in prn. )   Bathroom Toilet: Handicapped height Bathroom Accessibility: Yes   Home Equipment: Cane - single point;Walker - 2 wheels   Additional Comments: Pt unsure if she stay has her RW. will have family member check. Reported only 1 fall (that led to this admission) in the last 6 months.      Prior Functioning/Environment Level of Independence: Independent        Comments: Does not use AD at baseline        OT Problem  List: Decreased strength;Decreased range of motion;Decreased activity tolerance;Impaired balance (sitting and/or standing);Decreased safety awareness;Decreased knowledge of use of DME or AE      OT Treatment/Interventions:      OT Goals(Current goals can be found in the care plan section) Acute Rehab OT Goals Patient Stated Goal: to get back to PLOF OT Goal Formulation: With patient/family Time For Goal Achievement: 06/24/18 Potential to Achieve Goals: Good  OT Frequency: Min 1X/week   Barriers to D/C: Decreased caregiver support          Co-evaluation              AM-PAC OT "6 Clicks" Daily Activity     Outcome Measure Help from another person eating meals?: None Help from another person taking care of personal grooming?: A Little Help from another person toileting, which includes using toliet, bedpan, or urinal?: A Little Help from another person bathing (including washing, rinsing, drying)?: A Little Help from another person to put on and taking off regular upper body  clothing?: A Little Help from another person to put on and taking off regular lower body clothing?: A Lot 6 Click Score: 18   End of Session Equipment Utilized During Treatment: Rolling walker;Other (comment)(Long handle reacher, sock aid.)  Activity Tolerance: Patient tolerated treatment well Patient left: in chair;with call bell/phone within reach;with chair alarm set;with family/visitor present;with SCD's reapplied  OT Visit Diagnosis: Unsteadiness on feet (R26.81);Other abnormalities of gait and mobility (R26.89);History of falling (Z91.81)                Time: 0930-1001 OT Time Calculation (min): 31 min Charges:  OT General Charges $OT Visit: 1 Visit OT Evaluation $OT Eval Low Complexity: 1 Low OT Treatments $Self Care/Home Management : 23-37 mins  Ramona Ruark, OTR/L 06/10/18, 11:50 AM

## 2018-06-10 NOTE — Progress Notes (Signed)
[place] [facility] [place] NAME: Natalie Gardner    MR#:  [id number]  DATE OF BIRTH:  06-Jan-1946  SUBJECTIVE:   Patient presented to the hospital after a mechanical fall noted to have a left hip fracture.  Status post intramedullary fixation of the left hip fracture POD # 1. Today.  No complaints this a.m.   REVIEW OF SYSTEMS:    Review of Systems  Constitutional: Negative for chills and fever.  HENT: Negative for congestion and tinnitus.   Eyes: Negative for blurred vision and double vision.  Respiratory: Negative for cough, shortness of breath and wheezing.   Cardiovascular: Negative for chest pain, orthopnea and PND.  Gastrointestinal: Negative for abdominal pain, diarrhea, nausea and vomiting.  Genitourinary: Negative for dysuria and hematuria.  Neurological: Negative for dizziness, sensory change and focal weakness.  All other systems reviewed and are negative.   Nutrition: Heart Healthy Tolerating Diet: Yes Tolerating PT: Eval noted.   DRUG ALLERGIES:   Allergies  Allergen Reactions  . Penicillins     Yeast infections Has patient had a PCN reaction causing immediate rash, facial/tongue/throat swelling, SOB or lightheadedness with hypotension: No Has patient had a PCN reaction causing severe rash involving mucus membranes or skin necrosis: No Has patient had a PCN reaction that required hospitalization: No Has patient had a PCN reaction occurring within the last 10 years: No If all of the above answers are "NO", then may proceed with Cephalosporin use.   Marland Kitchen Penicillin V Potassium Other (See Comments)    Yeast infection  . Protonix [Pantoprazole Sodium] Rash  . Sulfa Antibiotics     Yeast infections  Other reaction(s): Other (See Comments) Yeast infection    VITALS:  Blood pressure 109/69, pulse 95, temperature 97.6 F (36.4 C), temperature source Oral, resp. rate 18, weight 77.1 kg, SpO2 99 %.  PHYSICAL EXAMINATION:    Physical Exam  GENERAL:  73 y.o.-year-old patient lying in bed in no acute distress.  EYES: Pupils equal, round, reactive to light and accommodation. No scleral icterus. Extraocular muscles intact.  HEENT: Head atraumatic, normocephalic. Oropharynx and nasopharynx clear.  NECK:  Supple, no jugular venous distention. No thyroid enlargement, no tenderness.  LUNGS: Normal breath sounds bilaterally, no wheezing, rales, rhonchi. No use of accessory muscles of respiration.  CARDIOVASCULAR: S1, S2 normal. No murmurs, rubs, or gallops.  ABDOMEN: Soft, nontender, nondistended. Bowel sounds present. No organomegaly or mass.  EXTREMITIES: No cyanosis, clubbing or edema b/l. Left hip dressing in place. No acute bleeding.    NEUROLOGIC: Cranial nerves II through XII are intact. No focal Motor or sensory deficits b/l.   PSYCHIATRIC: The patient is alert and oriented x 3.  SKIN: No obvious rash, lesion, or ulcer.    LABORATORY PANEL:   CBC Recent Labs  Lab 06/10/18 0249  WBC 13.5*  HGB 8.8*  HCT 26.8*  PLT 185   ------------------------------------------------------------------------------------------------------------------  Chemistries  Recent Labs  Lab 06/10/18 0249  NA 130*  K 5.0  CL 102  CO2 21*  GLUCOSE 195*  BUN 33*  CREATININE 1.08*  CALCIUM 8.0*   ------------------------------------------------------------------------------------------------------------------  Cardiac Enzymes No results for input(s): TROPONINI in the last 168 hours. ------------------------------------------------------------------------------------------------------------------  RADIOLOGY:  Dg Hip Operative Unilat W Or W/o Pelvis Left  Result Date: 06/09/2018 CLINICAL DATA:  Left IM nail EXAM: OPERATIVE LEFT HIP (WITH PELVIS IF PERFORMED) 6 VIEWS TECHNIQUE: Fluoroscopic spot image(s) were submitted for interpretation post-operatively. FLUOROSCOPY TIME:  2 minutes  16 seconds COMPARISON:  Left hip  radiographs dated 06/08/2018 FINDINGS: Intraoperative fluoroscopic radiographs during IM nail with dynamic hip screw fixation of an intertrochanteric left hip fracture. Two distal interlocking screws. Fracture fragments are in near anatomic alignment and position. IMPRESSION: Intraoperative fluoroscopic radiographs during ORIF of an intertrochanteric left hip fracture, as above. Electronically Signed   By: Julian Hy M.D.   On: 06/09/2018 20:35   Dg Femur Port Min 2 Views Left  Result Date: 06/09/2018 CLINICAL DATA:  Status post left IM nail EXAM: LEFT FEMUR PORTABLE 2 VIEWS COMPARISON:  Left hip radiographs dated 06/08/2018 FINDINGS: IM nail with dynamic hip screw fixation. Two distal interlocking screws. Minimal residual distraction of the femoral shaft at the subtrochanteric region. Bilateral hip joint spaces are preserved. Visualized bony pelvis appears intact. Degenerative changes of the lower lumbar spine. Left knee arthroplasty, incompletely visualized. IMPRESSION: Status post ORIF of an intertrochanteric left hip fracture, as above. Electronically Signed   By: Julian Hy M.D.   On: 06/09/2018 20:40     ASSESSMENT AND PLAN:   Natalie Gardner  is a 73 y.o. female with a known history of hypertension, A. fib on Coumadin, GERD, depression, CKD presents to hospital secondary to a fall and left hip pain.  1. Left hip fracture-secondary to mechanical fall and left intertrochanteric fracture. -Seen by orthopedics and status post left hip intramedullary fixation POD # 1. -Continue pain control as per orthopedics.   - appreciate PT/OT eval.    2.  Atrial fibrillation-has permanent nonvalvular A. Fib. -Rate controlled.  Continue metoprolol, Cardizem.  Patient was on Coumadin but INR reversed due to surgery. -INR subtherapeutic, will bridge with Lovenox, continue Coumadin.  3.  CKD stage III-seems to be stable at this time.  Continue to monitor.   4.  GERD-continue pepcid.   5.   Depression-on Effexor  6. Anemia - likely combination of anemia of chronic disease due to CKD with acute blood loss anemia from recent surgery.  Hemoglobin stable and no acute need for transfusion we will continue to monitor.   All the records are reviewed and case discussed with Care Management/Social Worker. Management plans discussed with the patient, family and they are in agreement.  CODE STATUS: Full code  DVT Prophylaxis: Coumadin  TOTAL TIME TAKING CARE OF THIS PATIENT: 30 minutes.   POSSIBLE D/C IN 1-2 DAYS, DEPENDING ON CLINICAL CONDITION.   Henreitta Leber M.D on 06/10/2018 at 2:17 PM  Between 7am to 6pm - Pager - [phone]  After 6pm go to [email or web address] - Proofreader  [facility]  Office  [phone]  CC: Primary care physician; Guadalupe Maple, MD

## 2018-06-10 NOTE — Clinical Social Work Placement (Signed)
   CLINICAL SOCIAL WORK PLACEMENT  NOTE  Date:  06/10/2018  Patient Details  Name: Natalie Gardner MRN: [id number] Date of Birth: [birth year]  Clinical Social Work is seeking post-discharge placement for this patient at the [place] level of care (*CSW will initial, date and re-position this form in  chart as items are completed):  Yes   Patient/family provided with [place] Work Department's list of facilities offering this level of care within the geographic area requested by the patient (or if unable, by the patient's family).  Yes   Patient/family informed of their freedom to choose among providers that offer the needed level of care, that participate in Medicare, Medicaid or managed care program needed by the patient, have an available bed and are willing to accept the patient.  Yes   Patient/family informed of [facility]'s ownership interest in [facility] and [facility], as well as of the fact that they are under no obligation to receive care at these facilities.  PASRR submitted to EDS on 06/10/18     PASRR number received on 06/10/18     Existing PASRR number confirmed on       FL2 transmitted to all facilities in geographic area requested by pt/family on 06/10/18     FL2 transmitted to all facilities within larger geographic area on       Patient informed that his/her managed care company has contracts with or will negotiate with certain facilities, including the following:        Yes   Patient/family informed of bed offers received.  Patient chooses bed at [facility] )     Physician recommends and patient chooses bed at      Patient to be transferred to   on  .  Patient to be transferred to facility by       Patient family notified on   of transfer.  Name of family member notified:        PHYSICIAN       Additional Comment:    _______________________________________________ Reuel Lamadrid, Veronia Beets,  LCSW 06/10/2018, 3:50 PM

## 2018-06-10 NOTE — Clinical Social Work Note (Addendum)
Clinical Social Work Assessment  Patient Details  Name: Natalie Gardner MRN: [id number] Date of Birth: [birth year]  Date of referral:  06/10/18               Reason for consult:  Facility Placement                Permission sought to share information with:  Natalie Gardner granted to share information::  Yes, Verbal Permission Granted  Name::      Natalie Gardner::   [place]   Relationship::     Contact Information:     Housing/Transportation Living arrangements for the past 2 months:  [place] of Information:  Patient, Spouse Patient Interpreter Needed:    Criminal Activity/Legal Involvement Pertinent to Current Situation/Hospitalization:  No - Comment as needed Significant Relationships:  Adult Children, Spouse Lives with:  Spouse Do you feel safe going back to the place where you live?  Yes Need for family participation in patient care:  Yes (Comment)  Care giving concerns:  Patient lives in [place] with her husband Natalie Gardner.    Social Worker assessment / plan:  Holiday representative (CSW) received SNF consult. Per RN patient wants SNF. PT is recommending home health. CSW met with patient alone at bedside to discuss D/C plan. Patient was alert and oriented X4 and was sitting up in the chair at bedside. CSW introduced self and explained role of CSW department. Per patient she lives in [place] with her husband. CSW explained that PT is recommending home health and that Natalie Gardner will likely not approve SNF. Patient requested for CSW to see if Natalie Gardner will approve SNF. FL2 complete and faxed out.   CSW presented bed offers to patient and she chose [facility]. Patient is okay with a semi-private room at Natalie Gardner. Per Natalie Gardner admissions coordinator at Natalie Gardner she will start [facility] SNF authorization today. CSW attempted to contact patient's daughter Natalie Gardner [phone] at patient's request however Natalie Gardner did not answer and a  voicemail was left. RN case manager aware of above. CSW will continue to follow and assist as needed.   Patient's daughter Natalie Gardner called CSW back and was made aware of above.   Employment status:  Disabled (Comment on whether or not currently receiving Disability), Retired Nurse, adult PT Recommendations:  Home with [place] / Referral to community resources:  Natalie Gardner  Patient/Family's Response to care: Patient requested [facility] Gardner.   Patient/Family's Understanding of and Emotional Response to Diagnosis, Current Treatment, and Prognosis: Patient was very pleasant and thanked CSW for assistance.   Emotional Assessment Appearance:  Appears stated age Attitude/Demeanor/Rapport:    Affect (typically observed):  Accepting, Adaptable, Pleasant Orientation:  Oriented to Self, Oriented to Place, Oriented to  Time, Oriented to Situation Alcohol / Substance use:  Not Applicable Psych involvement (Current and /or in the community):  No (Comment)  Discharge Needs  Concerns to be addressed:  Discharge Planning Concerns Readmission within the last 30 days:  No Current discharge risk:  None Barriers to Discharge:  Continued Medical Work up   [employer], Natalie Beets, LCSW 06/10/2018, 3:51 PM

## 2018-06-10 NOTE — NC FL2 (Signed)
[place] LEVEL OF CARE SCREENING TOOL     IDENTIFICATION  Patient Name: Natalie Gardner Birthdate: [birth year] Sex: female Admission Date (Current Location): 06/08/2018  [place] and [place] Number:  Engineering geologist and Address:  [facility], [address], [place], [place] [address]      Provider Number: [id number]  Attending Physician Name and Address:  Henreitta Leber, MD  Relative Name and Phone Number:       Current Level of Care: Hospital Recommended Level of Care: [place] Prior Approval Number:    Date Approved/Denied:   PASRR Number: [id number])  Discharge Plan: SNF    Current Diagnoses: Patient Active Problem List   Diagnosis Date Noted  . Closed left hip fracture ([place]) 06/08/2018  . Depression, major, single episode, moderate ([place]) 11/14/2017  . IDA (iron deficiency anemia) 07/31/2017  . Closed displaced fracture of head of left radius 06/20/2017  . Anemia, unspecified 05/14/2017  . Insomnia 04/10/2017  . Status post total left knee replacement 02/12/2017  . 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 Advanced care planning/counseling discussion 09/28/2016  . Essential hypertension 09/13/2015  . CHF (congestive heart failure) ([place]) 09/13/2015  . Atrial fibrillation ([place]) 09/13/2015  . Hyperlipidemia 09/13/2015  . Diabetes mellitus without complication ([place]) 18/56/3149    Orientation RESPIRATION BLADDER Height & Weight     Self, Time, Situation, Place  Normal Continent Weight: 170 lb (77.1 kg) Height:     BEHAVIORAL SYMPTOMS/MOOD NEUROLOGICAL BOWEL NUTRITION STATUS      Continent Diet(Diet: Regular )  AMBULATORY STATUS COMMUNICATION OF NEEDS Skin   Extensive Assist Verbally Surgical wounds                       Personal Care Assistance Level of Assistance  Bathing, Feeding, Dressing Bathing Assistance: Limited assistance Feeding assistance: Independent Dressing Assistance: Limited  assistance     Functional Limitations Info  Sight, Hearing, Speech Sight Info: Adequate Hearing Info: Adequate Speech Info: Adequate    SPECIAL CARE FACTORS FREQUENCY  PT (By licensed PT), OT (By licensed OT)     PT Frequency: (5) OT Frequency: (5)            Contractures      Additional Factors Info  Code Status, Allergies Code Status Info: (Full Code. ) Allergies Info: (Penicillins, Penicillin V Potassium, Protonix Pantoprazole Sodium, Sulfa Antibiotics)           Current Medications (06/10/2018):  This is the current hospital active medication list Current Facility-Administered Medications  Medication Dose Route Frequency Provider Last Rate Last Dose  . 0.9 % NaCl with KCl 20 mEq/ L  infusion   Intravenous Continuous Thornton Park, MD 75 mL/hr at 06/10/18 0500    . acetaminophen (TYLENOL) tablet 650 mg  650 mg Oral Q6H PRN Thornton Park, MD       Or  . acetaminophen (TYLENOL) suppository 650 mg  650 mg Rectal Q6H PRN Thornton Park, MD      . acetaminophen (TYLENOL) tablet 500 mg  500 mg Oral Q6H Thornton Park, MD   500 mg at 06/10/18 0555  . alum & mag hydroxide-simeth (MAALOX/MYLANTA) 200-200-20 MG/5ML suspension 30 mL  30 mL Oral Q4H PRN Thornton Park, MD      . atorvastatin (LIPITOR) tablet 10 mg  10 mg Oral QHS Thornton Park, MD   10 mg at 06/09/18 2042  . bisacodyl (DULCOLAX) suppository 10 mg  10 mg Rectal Daily PRN  Thornton Park, MD      . diltiazem (CARDIZEM LA) 24 hr tablet 180 mg  180 mg Oral Daily Thornton Park, MD   180 mg at 06/09/18 0840  . docusate sodium (COLACE) capsule 100 mg  100 mg Oral BID Thornton Park, MD   100 mg at 06/09/18 2042  . famotidine (PEPCID) tablet 20 mg  20 mg Oral Daily Thornton Park, MD   20 mg at 06/08/18 1626  . ferrous sulfate tablet 325 mg  325 mg Oral BID WC Thornton Park, MD   325 mg at 06/10/18 7622  . HYDROcodone-acetaminophen (NORCO) 7.5-325 MG per tablet 1-2 tablet  1-2 tablet Oral Q4H PRN  Thornton Park, MD   1 tablet at 06/10/18 6333  . HYDROcodone-acetaminophen (NORCO/VICODIN) 5-325 MG per tablet 1-2 tablet  1-2 tablet Oral Q4H PRN Thornton Park, MD      . ketorolac (TORADOL) 15 MG/ML injection 7.5 mg  7.5 mg Intravenous Q6H Thornton Park, MD   7.5 mg at 06/10/18 0555  . lisinopril (PRINIVIL,ZESTRIL) tablet 40 mg  40 mg Oral Daily Thornton Park, MD      . magnesium citrate solution 1 Bottle  1 Bottle Oral Once PRN Thornton Park, MD      . menthol-cetylpyridinium (CEPACOL) lozenge 3 mg  1 lozenge Oral PRN Thornton Park, MD       Or  . phenol (CHLORASEPTIC) mouth spray 1 spray  1 spray Mouth/Throat PRN Thornton Park, MD      . methocarbamol (ROBAXIN) tablet 500 mg  500 mg Oral Q6H PRN Thornton Park, MD       Or  . methocarbamol (ROBAXIN) 500 mg in dextrose 5 % 50 mL IVPB  500 mg Intravenous Q6H PRN Thornton Park, MD      . metoprolol succinate (TOPROL-XL) 24 hr tablet 50 mg  50 mg Oral QPM Thornton Park, MD   50 mg at 06/09/18 1643  . morphine 2 MG/ML injection 1 mg  1 mg Intravenous Q2H PRN Thornton Park, MD      . multivitamin with minerals tablet 1 tablet  1 tablet Oral Daily Thornton Park, MD   1 tablet at 06/10/18 5456  . ondansetron (ZOFRAN) tablet 4 mg  4 mg Oral Q6H PRN Thornton Park, MD       Or  . ondansetron [facility]) injection 4 mg  4 mg Intravenous Q6H PRN Thornton Park, MD      . polyethylene glycol (MIRALAX / GLYCOLAX) packet 17 g  17 g Oral Daily PRN Thornton Park, MD   17 g at 06/10/18 2563  . sucralfate (CARAFATE) tablet 1 g  1 g Oral BID Thornton Park, MD   1 g at 06/10/18 8937  . traZODone (DESYREL) tablet 25-50 mg  25-50 mg Oral QHS PRN Thornton Park, MD      . venlafaxine XR (EFFEXOR-XR) 24 hr capsule 75 mg  75 mg Oral Q breakfast Thornton Park, MD   75 mg at 06/10/18 3428  . warfarin (COUMADIN) tablet 7.5 mg  7.5 mg Oral ONCE-1800 Tawnya Crook, [facility]   Stopped at 06/09/18 1920  . Warfarin - Pharmacist  Dosing Inpatient   Does not apply q1800 Tawnya Crook, [place] at 06/09/18 7681     Discharge Medications: Please see discharge summary for a list of discharge medications.  Relevant Imaging Results:  Relevant Lab Results:   Additional Information (SSN: [id number])  Candy Ziegler, Veronia Beets, LCSW

## 2018-06-10 NOTE — Progress Notes (Signed)
Physical Therapy Treatment Patient Details Name: Natalie Gardner MRN: [id number] DOB: [birth year] Today's Date: 06/10/2018    History of Present Illness  Pt is 73 y.o. female s/p IM nailing of L hip 06/09/2018, WBAT. PMH of HTN, A. fib on Coumadin, GERD, CHF, depression, CKD.    PT Comments    Pt up in chair, agreeable to PT, no L hip pain at rest, though pt does endorse some "soreness" after ambulation. Pt able to perform therapeutic exercises with verbal cues/tactile cues, AAROM for heel slides/hip abduction, good quad control noted during L LAQ. Pt transferred with CGA and moderate verbal cues for proper hand placement, of note pt has poor eccentric control from stand to sit, instructed to use UE to improve safety. Pt ambulated ~186ft with RW and CGA. Pt with improved step through gait pattern, initially with circumduction during swing phase, progressed to improved knee flexion in swing with time. Pt needed 2 standing rest breaks due to fatigue/SOB but spO2 >92% throughout. Pt reported at home she has similar endurance and needs seated/standing rest breaks as well. Pt fatigued at end of session, in chair with all needs in reach. The patient would benefit from further skilled PT to continue to progress towards goals.     Follow Up Recommendations  Home health PT     Equipment Recommendations  Other (comment)    Recommendations for Other Services       Precautions / Restrictions Precautions Precautions: Fall Restrictions Weight Bearing Restrictions: Yes LLE Weight Bearing: Weight bearing as tolerated    Mobility  Bed Mobility               General bed mobility comments: deferred up in chair at this time  Transfers Overall transfer level: Needs assistance Equipment used: Rolling walker (2 wheeled) Transfers: Sit to/from Stand Sit to Stand: Min guard         General transfer comment: Pt needed cues for hand placement for safe transfer  technique  Ambulation/Gait Ambulation/Gait assistance: Min guard Gait Distance (Feet): 110 Feet     Gait velocity: decreased   General Gait Details: Pt with improved step through gait pattern, initially with circumduction during swing phase, progressed to improved knee flexion in swing with time. Pt needed 2 standing rest breaks due to fatigue/SOB but spO2 >92% throughout.    Stairs             Wheelchair Mobility    Modified Rankin (Stroke Patients Only)       Balance Overall balance assessment: Needs assistance Sitting-balance support: Feet supported Sitting balance-Leahy Scale: Good Sitting balance - Comments: Pt able to maintain upright seated balance without any difficulty during this evaluation. Steady reaching outside of BOS with feet supported.    Standing balance support: No upper extremity supported Standing balance-Leahy Scale: Fair Standing balance comment: Able to use sanitizer without UE support                            Cognition Arousal/Alertness: Awake/alert Behavior During Therapy: WFL for tasks assessed/performed Overall Cognitive Status: Within Functional Limits for tasks assessed                                        Exercises Total Joint Exercises Ankle Circles/Pumps: AROM;Both;10 reps;Seated Quad Sets: AROM;Left;10 reps;Seated Heel Slides: AAROM;Left;10 reps;Seated Hip ABduction/ADduction: AAROM;Left;10 reps;Seated Long Arc  Quad: AROM;Strengthening;10 reps;Both;Seated Other Exercises Other Exercises: Pt performed commode transfer with bedside commode over toilet for elevated surface (pt uses this method at home as well.) Supervision for toileting needs, CGA for transfers.    General Comments General comments (skin integrity, edema, etc.): Pt complained of some "tailbone" pain, repositioned up in chair, instructed to shift weight intermittently.      Pertinent Vitals/Pain Pain Assessment: No/denies  pain(stated she had some soreness but did not want to say it was pain.)    Home Living Family/patient expects to be discharged to:: Private residence Living Arrangements: Spouse/significant other Available Help at Discharge: Family;Available PRN/intermittently Type of Home: House Home Access: Stairs to enter Entrance Stairs-Rails: None Home Layout: One level Home Equipment: Cane - single point;Walker - 2 wheels Additional Comments: Pt unsure if she stay has her RW. will have family member check. Reported only 1 fall (that led to this admission) in the last 6 months.    Prior Function Level of Independence: Independent      Comments: Does not use AD at baseline   PT Goals (current goals can now be found in the care plan section) Acute Rehab PT Goals Patient Stated Goal: to get back to PLOF Progress towards PT goals: Progressing toward goals    Frequency    BID      PT Plan Current plan remains appropriate    Co-evaluation              AM-PAC PT "6 Clicks" Mobility   Outcome Measure  Help needed turning from your back to your side while in a flat bed without using bedrails?: A Little Help needed moving from lying on your back to sitting on the side of a flat bed without using bedrails?: A Little Help needed moving to and from a bed to a chair (including a wheelchair)?: A Little Help needed standing up from a chair using your arms (e.g., wheelchair or bedside chair)?: A Little Help needed to walk in hospital room?: A Little Help needed climbing 3-5 steps with a railing? : A Little 6 Click Score: 18    End of Session Equipment Utilized During Treatment: Gait belt Activity Tolerance: Patient tolerated treatment well Patient left: with chair alarm set;in chair;with call bell/phone within reach;with family/visitor present Nurse Communication: Mobility status PT Visit Diagnosis: Unsteadiness on feet (R26.81);Difficulty in walking, not elsewhere classified (R26.2);Other  abnormalities of gait and mobility (R26.89);Muscle weakness (generalized) (M62.81);History of falling (Z91.81);Pain Pain - Right/Left: Left Pain - part of body: Hip     Time: 1305-1330 PT Time Calculation (min) (ACUTE ONLY): 25 min  Charges:  $Therapeutic Exercise: 8-22 mins $Therapeutic Activity: 8-22 mins                    Lieutenant Diego PT, DPT 2:27 PM,06/10/18 [phone]

## 2018-06-11 ENCOUNTER — Encounter
Admission: RE | Admit: 2018-06-11 | Discharge: 2018-06-11 | Disposition: A | Discharge status: Home / Self Care | Payer: Medicare Other | Source: Ambulatory Visit | Attending: Internal Medicine | Admitting: Internal Medicine

## 2018-06-11 DIAGNOSIS — D631 Anemia in chronic kidney disease: Secondary | ICD-10-CM | POA: Diagnosis not present

## 2018-06-11 DIAGNOSIS — M6281 Muscle weakness (generalized): Secondary | ICD-10-CM | POA: Diagnosis not present

## 2018-06-11 DIAGNOSIS — E1159 Type 2 diabetes mellitus with other circulatory complications: Secondary | ICD-10-CM | POA: Diagnosis not present

## 2018-06-11 DIAGNOSIS — Z5181 Encounter for therapeutic drug level monitoring: Secondary | ICD-10-CM | POA: Diagnosis not present

## 2018-06-11 DIAGNOSIS — I480 Paroxysmal atrial fibrillation: Secondary | ICD-10-CM | POA: Diagnosis not present

## 2018-06-11 DIAGNOSIS — I130 Hypertensive heart and chronic kidney disease with heart failure and stage 1 through stage 4 chronic kidney disease, or unspecified chronic kidney disease: Secondary | ICD-10-CM | POA: Diagnosis not present

## 2018-06-11 DIAGNOSIS — Z9181 History of falling: Secondary | ICD-10-CM | POA: Diagnosis not present

## 2018-06-11 DIAGNOSIS — M8000XD Age-related osteoporosis with current pathological fracture, unspecified site, subsequent encounter for fracture with routine healing: Secondary | ICD-10-CM | POA: Diagnosis not present

## 2018-06-11 DIAGNOSIS — I429 Cardiomyopathy, unspecified: Secondary | ICD-10-CM | POA: Diagnosis not present

## 2018-06-11 DIAGNOSIS — D509 Iron deficiency anemia, unspecified: Secondary | ICD-10-CM | POA: Diagnosis not present

## 2018-06-11 DIAGNOSIS — I5032 Chronic diastolic (congestive) heart failure: Secondary | ICD-10-CM | POA: Diagnosis not present

## 2018-06-11 DIAGNOSIS — S72002A Fracture of unspecified part of neck of left femur, initial encounter for closed fracture: Secondary | ICD-10-CM | POA: Diagnosis not present

## 2018-06-11 DIAGNOSIS — E782 Mixed hyperlipidemia: Secondary | ICD-10-CM | POA: Diagnosis not present

## 2018-06-11 DIAGNOSIS — E1169 Type 2 diabetes mellitus with other specified complication: Secondary | ICD-10-CM | POA: Diagnosis not present

## 2018-06-11 DIAGNOSIS — I482 Chronic atrial fibrillation, unspecified: Secondary | ICD-10-CM | POA: Diagnosis not present

## 2018-06-11 DIAGNOSIS — K219 Gastro-esophageal reflux disease without esophagitis: Secondary | ICD-10-CM | POA: Diagnosis not present

## 2018-06-11 DIAGNOSIS — Z7901 Long term (current) use of anticoagulants: Secondary | ICD-10-CM | POA: Diagnosis not present

## 2018-06-11 DIAGNOSIS — S72142D Displaced intertrochanteric fracture of left femur, subsequent encounter for closed fracture with routine healing: Secondary | ICD-10-CM | POA: Diagnosis not present

## 2018-06-11 DIAGNOSIS — M25552 Pain in left hip: Secondary | ICD-10-CM | POA: Diagnosis not present

## 2018-06-11 DIAGNOSIS — I4891 Unspecified atrial fibrillation: Secondary | ICD-10-CM | POA: Diagnosis not present

## 2018-06-11 DIAGNOSIS — E1122 Type 2 diabetes mellitus with diabetic chronic kidney disease: Secondary | ICD-10-CM | POA: Diagnosis not present

## 2018-06-11 DIAGNOSIS — M810 Age-related osteoporosis without current pathological fracture: Secondary | ICD-10-CM | POA: Diagnosis not present

## 2018-06-11 DIAGNOSIS — N183 Chronic kidney disease, stage 3 (moderate): Secondary | ICD-10-CM | POA: Diagnosis not present

## 2018-06-11 DIAGNOSIS — S72002S Fracture of unspecified part of neck of left femur, sequela: Secondary | ICD-10-CM | POA: Diagnosis not present

## 2018-06-11 DIAGNOSIS — E119 Type 2 diabetes mellitus without complications: Secondary | ICD-10-CM | POA: Diagnosis not present

## 2018-06-11 DIAGNOSIS — I10 Essential (primary) hypertension: Secondary | ICD-10-CM | POA: Diagnosis not present

## 2018-06-11 LAB — PROTIME-INR
INR: 1.27
Prothrombin Time: 15.8 seconds — ABNORMAL HIGH (ref 11.4–15.2)

## 2018-06-11 LAB — BASIC METABOLIC PANEL
Anion gap: 8 (ref 5–15)
BUN: 41 mg/dL — ABNORMAL HIGH (ref 8–23)
CO2: 19 mmol/L — ABNORMAL LOW (ref 22–32)
Calcium: 7.9 mg/dL — ABNORMAL LOW (ref 8.9–10.3)
Chloride: 103 mmol/L (ref 98–111)
Creatinine, Ser: 1.02 mg/dL — ABNORMAL HIGH (ref 0.44–1.00)
GFR calc Af Amer: >60 mL/min (ref >60)
GFR calc non Af Amer: 55 mL/min — ABNORMAL LOW (ref >60)
Glucose, Bld: 154 mg/dL — ABNORMAL HIGH (ref 70–99)
Potassium: 4.4 mmol/L (ref 3.5–5.1)
SODIUM: 130 mmol/L — AB (ref 135–145)

## 2018-06-11 LAB — CBC
HCT: 24.7 % — ABNORMAL LOW (ref 36.0–46.0)
HEMOGLOBIN: 8.2 g/dL — AB (ref 12.0–15.0)
MCH: 32.7 pg (ref 26.0–34.0)
MCHC: 33.2 g/dL (ref 30.0–36.0)
MCV: 98.4 fL (ref 80.0–100.0)
Platelets: 189 10*3/uL (ref 150–400)
RBC: 2.51 MIL/uL — ABNORMAL LOW (ref 3.87–5.11)
RDW: 12.6 % (ref 11.5–15.5)
WBC: 11 10*3/uL — ABNORMAL HIGH (ref 4.0–10.5)
nRBC: 0 % (ref 0.0–0.2)

## 2018-06-11 MED ORDER — WARFARIN SODIUM 5 MG PO TABS
5.0000 mg | ORAL_TABLET | Freq: Once | ORAL | Status: DC
  Filled 2018-06-11: qty 1

## 2018-06-11 MED ORDER — POLYETHYLENE GLYCOL 3350 17 G PO PACK: 17.0000 g | PACK | Freq: Every day | ORAL | 0 refills | Status: DC | PRN

## 2018-06-11 MED ORDER — HYDROCODONE-ACETAMINOPHEN 7.5-325 MG PO TABS: 1.0000 | ORAL_TABLET | ORAL | 0 refills | Status: DC | PRN

## 2018-06-11 MED ORDER — DOCUSATE SODIUM 100 MG PO CAPS: 100.0000 mg | ORAL_CAPSULE | Freq: Two times a day (BID) | ORAL | 0 refills | Status: DC | PRN

## 2018-06-11 MED ORDER — ENOXAPARIN SODIUM 80 MG/0.8ML ~~LOC~~ SOLN: 1.0000 mg/kg | Freq: Two times a day (BID) | SUBCUTANEOUS | Status: DC

## 2018-06-11 NOTE — Care Management Important Message (Signed)
Important Message  Patient Details  Name: Natalie Gardner MRN: [id number] Date of Birth: [birth year]   Medicare Important Message Given:  Yes    Juliann Pulse A Erdine Hulen 06/11/2018, 11:10 AM

## 2018-06-11 NOTE — Clinical Social Work Placement (Signed)
   CLINICAL SOCIAL WORK PLACEMENT  NOTE  Date:  06/11/2018  Patient Details  Name: Natalie Gardner MRN: [id number] Date of Birth: 07-12-1945  Clinical Social Work is seeking post-discharge placement for this patient at the [place] level of care (*CSW will initial, date and re-position this form in  chart as items are completed):  Yes   Patient/family provided with [place] Work Department's list of facilities offering this level of care within the geographic area requested by the patient (or if unable, by the patient's family).  Yes   Patient/family informed of their freedom to choose among providers that offer the needed level of care, that participate in Medicare, Medicaid or managed care program needed by the patient, have an available bed and are willing to accept the patient.  Yes   Patient/family informed of [place]'s ownership interest in [facility] and [facility], as well as of the fact that they are under no obligation to receive care at these facilities.  PASRR submitted to EDS on 06/10/18     PASRR number received on 06/10/18     Existing PASRR number confirmed on       FL2 transmitted to all facilities in geographic area requested by pt/family on 06/10/18     FL2 transmitted to all facilities within larger geographic area on       Patient informed that his/her managed care company has contracts with or will negotiate with certain facilities, including the following:        Yes   Patient/family informed of bed offers received.  Patient chooses bed at [facility] )     Physician recommends and patient chooses bed at      Patient to be transferred to [facility] ) on 06/11/18.  Patient to be transferred to facility by (Patient's daughter [name] will transport. )     Patient family notified on 06/11/18 of transfer.  Name of family member notified:  (Patient's daughter [name] is aware of D/C today. )      PHYSICIAN       Additional Comment:    _______________________________________________ Chelan Heringer, Veronia Beets, LCSW 06/11/2018, 3:44 PM

## 2018-06-11 NOTE — Progress Notes (Signed)
Physical Therapy Treatment Patient Details Name: Natalie Gardner MRN: [id number] DOB: 1945/09/17 Today's Date: 06/11/2018    History of Present Illness 73 y.o. female s/p IM nailing of L hip 06/09/2018, WBAT. PMH of HTN, A. fib on Coumadin, GERD, CHF, depression, CKD.    PT Comments    Pt is eager to leave the hospital this afternoon, but willing to go for short walk and trial the steps.  She did well with ambulation and though she again had fatigue and elevated HR (120s -130s with ambulation).  Pt shows good effort with PT sessions, does fatigue quickly.  Follow Up Recommendations  Home health PT     Equipment Recommendations       Recommendations for Other Services       Precautions / Restrictions Precautions Precautions: Fall Restrictions LLE Weight Bearing: Weight bearing as tolerated    Mobility  Bed Mobility Overal bed mobility: Needs Assistance Bed Mobility: Supine to Sit     Supine to sit: Min assist     General bed mobility comments: in recliner on arrival, returned to recliner post session  Transfers Overall transfer level: Modified independent Equipment used: Rolling walker (2 wheeled) Transfers: Sit to/from Stand Sit to Stand: Min guard         General transfer comment: Again cues for UE use, set up, sequencing, did not need direct assist  Ambulation/Gait Ambulation/Gait assistance: Min guard Gait Distance (Feet): 75 Feet Assistive device: Rolling walker (2 wheeled)       General Gait Details: Pt initially slow and guarded but quickly appeared more comfortable and did well with consistent cadence despite considerable fatigue.   Stairs Stairs: Yes Stairs assistance: Min guard Stair Management: Backwards;No rails;With walker Number of Stairs: 2 General stair comments: Pt anxious on the steps and wanting to use rails, did ultimately negotiate up/down 2 steps w/o rails with heavy cuing but only very light physical assist   Wheelchair  Mobility    Modified Rankin (Stroke Patients Only)       Balance Overall balance assessment: Modified Independent   Sitting balance-Leahy Scale: Good     Standing balance support: Bilateral upper extremity supported Standing balance-Leahy Scale: Good(with walker)                              Cognition Arousal/Alertness: Awake/alert Behavior During Therapy: WFL for tasks assessed/performed Overall Cognitive Status: Within Functional Limits for tasks assessed                                        Exercises Total Joint Exercises Ankle Circles/Pumps: AROM;10 reps Quad Sets: Strengthening;10 reps Gluteal Sets: AROM;10 reps Heel Slides: Strengthening;10 reps Hip ABduction/ADduction: Strengthening;10 reps Long Arc Quad: Strengthening;10 reps Marching in Standing: Seated;10 reps;AAROM    General Comments        Pertinent Vitals/Pain Pain Assessment: 0-10 Pain Score: 6     Home Living                      Prior Function            PT Goals (current goals can now be found in the care plan section) Progress towards PT goals: Progressing toward goals    Frequency    BID      PT Plan Current plan remains appropriate    Co-evaluation  AM-PAC PT "6 Clicks" Mobility   Outcome Measure  Help needed turning from your back to your side while in a flat bed without using bedrails?: A Little Help needed moving from lying on your back to sitting on the side of a flat bed without using bedrails?: A Little Help needed moving to and from a bed to a chair (including a wheelchair)?: A Little Help needed standing up from a chair using your arms (e.g., wheelchair or bedside chair)?: A Little Help needed to walk in hospital room?: A Little Help needed climbing 3-5 steps with a railing? : A Little 6 Click Score: 18    End of Session Equipment Utilized During Treatment: Gait belt Activity Tolerance: Patient tolerated  treatment well Patient left: with chair alarm set;in chair;with call bell/phone within reach;with family/visitor present   PT Visit Diagnosis: Unsteadiness on feet (R26.81);Difficulty in walking, not elsewhere classified (R26.2);Other abnormalities of gait and mobility (R26.89);Muscle weakness (generalized) (M62.81);History of falling (Z91.81);Pain Pain - Right/Left: Left Pain - part of body: Hip     Time: 1345-1402 PT Time Calculation (min) (ACUTE ONLY): 17 min  Charges:  $Gait Training: 8-22 mins $Therapeutic Exercise: 8-22 mins                     Kreg Shropshire, DPT 06/11/2018, 3:14 PM

## 2018-06-11 NOTE — Progress Notes (Addendum)
Subjective:  POD #2 s/p IM nail for left IT hip fracture.  Patient OOB to chair, eating lunch.  Patient reports left hip pain as mild.  Had a BM.  No complaints.  Going to [employer] today.  Objective:   VITALS:   Vitals:   06/10/18 1711 06/10/18 2254 06/10/18 2311 06/11/18 0812  BP: (!) 112/98 (!) 97/55 113/64 101/65  Pulse: 86 (!) 121 88 (!) 109  Resp:  19  16  Temp: 97.9 F (36.6 C) 98.5 F (36.9 C)  (!) 97.4 F (36.3 C)  TempSrc: Oral Oral  Oral  SpO2: 99% 99%  100%  Weight:        PHYSICAL EXAM: Left lower extremity Neurovascular intact Sensation intact distally Intact pulses distally Dorsiflexion/Plantar flexion intact Incision: scant drainage No cellulitis present Compartment soft  LABS  Results for orders placed or performed during the hospital encounter of 06/08/18 (from the past 24 hour(s))  CBC     Status: Abnormal   Collection Time: 06/11/18  4:29 AM  Result Value Ref Range   WBC 11.0 (H) 4.0 - 10.5 K/uL   RBC 2.51 (L) 3.87 - 5.11 MIL/uL   Hemoglobin 8.2 (L) 12.0 - 15.0 g/dL   HCT 24.7 (L) 36.0 - 46.0 %   MCV 98.4 80.0 - 100.0 fL   MCH 32.7 26.0 - 34.0 pg   MCHC 33.2 30.0 - 36.0 g/dL   RDW 12.6 11.5 - 15.5 %   Platelets 189 150 - 400 K/uL   nRBC 0.0 0.0 - 0.2 %  Basic metabolic panel     Status: Abnormal   Collection Time: 06/11/18  4:29 AM  Result Value Ref Range   Sodium 130 (L) 135 - 145 mmol/L   Potassium 4.4 3.5 - 5.1 mmol/L   Chloride 103 98 - 111 mmol/L   CO2 19 (L) 22 - 32 mmol/L   Glucose, Bld 154 (H) 70 - 99 mg/dL   BUN 41 (H) 8 - 23 mg/dL   Creatinine, Ser 1.02 (H) 0.44 - 1.00 mg/dL   Calcium 7.9 (L) 8.9 - 10.3 mg/dL   GFR calc non Af Amer 55 (L) >60 mL/min   GFR calc Af Amer >60 >60 mL/min   Anion gap 8 5 - 15  Protime-INR     Status: Abnormal   Collection Time: 06/11/18  4:29 AM  Result Value Ref Range   Prothrombin Time 15.8 (H) 11.4 - 15.2 seconds   INR 1.27     Dg Femur Port Min 2 Views Left  Result Date:  06/09/2018 CLINICAL DATA:  Status post left IM nail EXAM: LEFT FEMUR PORTABLE 2 VIEWS COMPARISON:  Left hip radiographs dated 06/08/2018 FINDINGS: IM nail with dynamic hip screw fixation. Two distal interlocking screws. Minimal residual distraction of the femoral shaft at the subtrochanteric region. Bilateral hip joint spaces are preserved. Visualized bony pelvis appears intact. Degenerative changes of the lower lumbar spine. Left knee arthroplasty, incompletely visualized. IMPRESSION: Status post ORIF of an intertrochanteric left hip fracture, as above. Electronically Signed   By: Julian Hy M.D.   On: 06/09/2018 20:40    Assessment/Plan: 2 Days Post-Op   Active Problems:   Closed left hip fracture [facility])   Patient remains stable.  Labs stable.  Patient progressing well with PT.   Ready for discharge to [facility].  WBAT on left lower extremity.  Patient will continue on her coumadin for a.fib. Follow up in the office in 10-14 days.   Thornton Park , MD  06/11/2018, 12:08 PM

## 2018-06-11 NOTE — Progress Notes (Signed)
Pt. Discharged to [facility]. This RN called report to RN at facility. Prescriptions in packet with pt. Patient assessment unchanged from this morning. IV discontinued per policy.

## 2018-06-11 NOTE — Progress Notes (Signed)
[place] for warfarin and enoxaparin  Indication: atrial fibrillation  Allergies  Allergen Reactions  . Penicillins     Yeast infections Has patient had a PCN reaction causing immediate rash, facial/tongue/throat swelling, SOB or lightheadedness with hypotension: No Has patient had a PCN reaction causing severe rash involving mucus membranes or skin necrosis: No Has patient had a PCN reaction that required hospitalization: No Has patient had a PCN reaction occurring within the last 10 years: No If all of the above answers are "NO", then may proceed with Cephalosporin use.   Marland Kitchen Penicillin V Potassium Other (See Comments)    Yeast infection  . Protonix [Pantoprazole Sodium] Rash  . Sulfa Antibiotics     Yeast infections  Other reaction(s): Other (See Comments) Yeast infection    Patient Measurements: Weight: 170 lb (77.1 kg)   Vital Signs: Temp: 98.5 F (36.9 C) (01/13 2254) Temp Source: Oral (01/13 2254) BP: 113/64 (01/13 2311) Pulse Rate: 88 (01/13 2311)  Labs: Recent Labs    06/09/18 0553 06/10/18 0249 06/11/18 0429  HGB 11.2* 8.8* 8.2*  HCT 34.0* 26.8* 24.7*  PLT 252 185 189  LABPROT 16.1* 14.0 15.8*  INR 1.30 1.09 1.27  CREATININE 0.97 1.08* 1.02*    Estimated Creatinine Clearance: 48.3 mL/min (A) (by C-G formula based on SCr of 1.02 mg/dL (H)).   Medical History: Past Medical History:  Diagnosis Date  . Anemia   . Aortic valve regurgitation   . Arthritis   . Atrial fibrillation ([place])   . Cancer ([place]) 12/2016   Basal Cell Skin Cancer  . Cardiomyopathy ([place])   . CHF (congestive heart failure) ([place])   . CKD (chronic kidney disease)   . Colitis   . Diverticulosis   . Dyspnea   . Dysrhythmia   . GERD (gastroesophageal reflux disease)   . GI bleed   . Heart murmur   . History of hiatal hernia   . Hypercholesterolemia   . Hyperlipidemia   . Hypertension   . Insomnia   . Osteopenia   . Pre-diabetes   .  Urinary, incontinence, stress female   . Vertigo     Assessment: 73 year old female with h/o atrial fibrillation on warfarin PTA. Spoke with patient and confirmed home dose. She takes 5 mg daily, except on Wednesday when she takes 7.5 mg. Patient s/p intramedullary fixation of left hip fracture. Patient received Vitamin K 10mg  IV prior to surgery. Per consult from Dr. Mack Guise, okay to resume warfarin tonight. Will bridge with enoxaparin.  Patient reports that she has been therapeutic on home regimen. She did present with a therapeutic INR 2.58, which was reversed for surgery.  Dose not given 1/12 according to [facility]- Nurse documented "surgery today."  Date INR Dose 1/12 1.3 Dose not given (7.5mg  ordered)  1/13 1.09 Warfarin 10mg  1/14 1.27   Goal of Therapy:  INR 2-3 Monitor platelets by anticoagulation protocol: Yes   Plan:  1. INR remains subtherapeutic. Will order warfarin 5mg  PO x 1 tonight.   2. Continue bridge therapy with Enoxaparin 1mg /kg (75mg ) SQ every 12 hours until INR therapeutic.   Continue to monitor Hgb/Hct/Plts. CBC ordered with AM labs.     Pernell Dupre, PharmD, BCPS Clinical Pharmacist 06/11/2018 7:29 AM

## 2018-06-11 NOTE — Progress Notes (Signed)
Patient is medically stable for D/C to [facility] today. Per [facility] admissions coordinator at [facility] authorization has been received and patient can come today to room 209-A. RN will call report at [phone]. Patient's daughter [name] will transport patient. Clinical Education officer, museum (CSW) sent D/C orders to [employer] via [employer]. Patient is aware of above. Patient's daughter [name] is aware of above. Please reconsult if future social work needs arise. CSW signing off.   [employer], LCSW [phone]

## 2018-06-11 NOTE — Progress Notes (Signed)
Physical Therapy Treatment Patient Details Name: Natalie GARCIAGARCIA MRN: [id number] DOB: [birth year] Today's Date: 06/11/2018    History of Present Illness  Pt is 73 y.o. female s/p IM nailing of L hip 06/09/2018, WBAT. PMH of HTN, A. fib on Coumadin, GERD, CHF, depression, CKD.    PT Comments    Pt is able to ambulate fully around the nurses' station this AM with consistent cadence and did not need excessive use of the walker.  She did have some fatigue and took a few brief standing rest breaks (O2 stayed in the high 90s, HR did increase to 130s).  She has some continued expected weakness in L hip with ABd and flexion but overall is making good gains and improving nicely.  She should be able to safely return home with HHPT on discharge once medically cleared.   Follow Up Recommendations  Home health PT     Equipment Recommendations       Recommendations for Other Services       Precautions / Restrictions Precautions Precautions: Fall Restrictions LLE Weight Bearing: Weight bearing as tolerated    Mobility  Bed Mobility Overal bed mobility: Needs Assistance Bed Mobility: Supine to Sit     Supine to sit: Min assist     General bed mobility comments: Pt needed assist to get LEs toward EOB, but showed good effort and did pull herself up  Transfers Overall transfer level: Needs assistance Equipment used: Rolling walker (2 wheeled) Transfers: Sit to/from Stand Sit to Stand: Min guard         General transfer comment: cues for set up and slightly raised bed height  Ambulation/Gait Ambulation/Gait assistance: Min guard Gait Distance (Feet): 200 Feet Assistive device: Rolling walker (2 wheeled)       General Gait Details: Pt did well with ambulation and after warming up was able to show consistent and safe cadence without excessive UE use on walker.  She did have some fatigue and needed 3 brief standing rest breaks (HR into the 130s at times) but no LOBs or overt safety  issues.    Stairs             Wheelchair Mobility    Modified Rankin (Stroke Patients Only)       Balance Overall balance assessment: Modified Independent   Sitting balance-Leahy Scale: Good     Standing balance support: Bilateral upper extremity supported Standing balance-Leahy Scale: Good                              Cognition Arousal/Alertness: Awake/alert Behavior During Therapy: WFL for tasks assessed/performed Overall Cognitive Status: Within Functional Limits for tasks assessed                                        Exercises Total Joint Exercises Ankle Circles/Pumps: AROM;10 reps Quad Sets: Strengthening;10 reps Gluteal Sets: AROM;10 reps Heel Slides: Strengthening;10 reps Hip ABduction/ADduction: Strengthening;10 reps Long Arc Quad: Strengthening;10 reps Marching in Standing: Seated;10 reps;AAROM    General Comments        Pertinent Vitals/Pain Pain Assessment: 0-10 Pain Score: 5     Home Living                      Prior Function            PT Goals (  current goals can now be found in the care plan section) Progress towards PT goals: Progressing toward goals    Frequency    BID      PT Plan Current plan remains appropriate    Co-evaluation              AM-PAC PT "6 Clicks" Mobility   Outcome Measure  Help needed turning from your back to your side while in a flat bed without using bedrails?: A Little Help needed moving from lying on your back to sitting on the side of a flat bed without using bedrails?: A Little Help needed moving to and from a bed to a chair (including a wheelchair)?: A Little Help needed standing up from a chair using your arms (e.g., wheelchair or bedside chair)?: A Little Help needed to walk in hospital room?: A Little Help needed climbing 3-5 steps with a railing? : A Little 6 Click Score: 18    End of Session Equipment Utilized During Treatment: Gait  belt Activity Tolerance: Patient tolerated treatment well Patient left: with chair alarm set;in chair;with call bell/phone within reach;with family/visitor present   PT Visit Diagnosis: Unsteadiness on feet (R26.81);Difficulty in walking, not elsewhere classified (R26.2);Other abnormalities of gait and mobility (R26.89);Muscle weakness (generalized) (M62.81);History of falling (Z91.81);Pain Pain - Right/Left: Left Pain - part of body: Hip     Time: 4469-5072 PT Time Calculation (min) (ACUTE ONLY): 29 min  Charges:  $Gait Training: 8-22 mins $Therapeutic Exercise: 8-22 mins                      Kreg Shropshire, DPT 06/11/2018, 12:11 PM

## 2018-06-11 NOTE — Discharge Summary (Signed)
[facility] NAME: Natalie Gardner    MR#:  [id number]  DATE OF BIRTH:  1946-02-16  DATE OF ADMISSION:  06/08/2018 ADMITTING PHYSICIAN: Gladstone Lighter, MD  DATE OF DISCHARGE: 06/11/2018  PRIMARY CARE PHYSICIAN: Guadalupe Maple, MD    ADMISSION DIAGNOSIS:  Closed fracture of left hip, initial encounter ([place]) [S72.002A] Fall, initial encounter [W19.XXXA]  DISCHARGE DIAGNOSIS:  Active Problems:   Closed left hip fracture ([place])   SECONDARY DIAGNOSIS:   Past Medical History:  Diagnosis Date  . Anemia   . Aortic valve regurgitation   . Arthritis   . Atrial fibrillation ([place])   . Cancer ([place]) 12/2016   Basal Cell Skin Cancer  . Cardiomyopathy ([place])   . CHF (congestive heart failure) ([place])   . CKD (chronic kidney disease)   . Colitis   . Diverticulosis   . Dyspnea   . Dysrhythmia   . GERD (gastroesophageal reflux disease)   . GI bleed   . Heart murmur   . History of hiatal hernia   . Hypercholesterolemia   . Hyperlipidemia   . Hypertension   . Insomnia   . Osteopenia   . Pre-diabetes   . Urinary, incontinence, stress female   . Vertigo     HOSPITAL COURSE:   CarolynStoutis a73 y.o.femalewith a known history of hypertension, A. fib on Coumadin, GERD, depression, CKD presents to hospital secondary to a fall and left hip pain.  1. Lefthip fracture-secondary to mechanical fall and left intertrochanteric fracture. -Seen by orthopedics and status post left hip intramedullary fixation POD # 2. -Patient's pain is well controlled on some oral Vicodin, she is tolerating physical therapy well.  She will be discharged to a skilled nursing facility for ongoing rehab.  Follow-up with orthopedics in the next 10 to 14 days.  Follow-up with Dr. Mack Guise.  2. Atrial fibrillation-has permanent nonvalvular A. Fib. -This currently remains rate controlled.  Patient will continue her metoprolol and Cardizem -Patient will  continue her Coumadin but I will bridge her with Lovenox for a few days to get a therapeutic up to an INR over 2.0.  Patient should have daily INR checks for the next 3 to 4 days until her INR is therapeutic.  3. CKD stage III This is at baseline and can be further followed as an outpatient.  4. GERD-we will continue Zantac, Carafate.  5. Depression- pt. Will cont. Her Effexor  6. Anemia - likely combination of anemia of chronic disease due to CKD with acute blood loss anemia from recent surgery.  - pt's. hemoglobin has remained stable and does not require any transfusions presently.  7. Essential hypertension-patient will continue her Toprol, hydrochlorothiazide, lisinopril, Aldactone. -Presently hemodynamically stable.  8.  Hyperlipidemia-patient will continue her atorvastatin.  DISCHARGE CONDITIONS:   Stable.   CONSULTS OBTAINED:  Treatment Team:  Thornton Park, MD  DRUG ALLERGIES:   Allergies  Allergen Reactions  . Penicillins     Yeast infections Has patient had a PCN reaction causing immediate rash, facial/tongue/throat swelling, SOB or lightheadedness with hypotension: No Has patient had a PCN reaction causing severe rash involving mucus membranes or skin necrosis: No Has patient had a PCN reaction that required hospitalization: No Has patient had a PCN reaction occurring within the last 10 years: No If all of the above answers are "NO", then may proceed with Cephalosporin use.   Marland Kitchen Penicillin V Potassium Other (See Comments)    Yeast infection  . Protonix [  Pantoprazole Sodium] Rash  . Sulfa Antibiotics     Yeast infections  Other reaction(s): Other (See Comments) Yeast infection    DISCHARGE MEDICATIONS:   Allergies as of 06/11/2018      Reactions   Penicillins    Yeast infections Has patient had a PCN reaction causing immediate rash, facial/tongue/throat swelling, SOB or lightheadedness with hypotension: No Has patient had a PCN reaction causing  severe rash involving mucus membranes or skin necrosis: No Has patient had a PCN reaction that required hospitalization: No Has patient had a PCN reaction occurring within the last 10 years: No If all of the above answers are "NO", then may proceed with Cephalosporin use.   Penicillin V Potassium Other (See Comments)   Yeast infection   Protonix [pantoprazole Sodium] Rash   Sulfa Antibiotics    Yeast infections  Other reaction(s): Other (See Comments) Yeast infection      Medication List    TAKE these medications   acetaminophen 650 MG CR tablet Commonly known as:  TYLENOL Take 1,300 mg by mouth at bedtime.   atorvastatin 10 MG tablet Commonly known as:  LIPITOR TAKE 1 TABLET BY MOUTH AT BEDTIME FOR CHOLESTEROL What changed:    how much to take  how to take this  when to take this  additional instructions   diltiazem 180 MG 24 hr capsule Commonly known as:  DILACOR XR Take 180 mg by mouth daily.   diphenhydrAMINE 25 MG tablet Commonly known as:  BENADRYL Take 25 mg by mouth 2 (two) times daily as needed for allergies.   docusate sodium 100 MG capsule Commonly known as:  COLACE Take 1 capsule (100 mg total) by mouth 2 (two) times daily as needed for mild constipation.   enoxaparin 80 MG/0.8ML injection Commonly known as:  LOVENOX Inject 0.75 mLs (75 mg total) into the skin every 12 (twelve) hours for 4 days.   ferrous sulfate 325 (65 FE) MG tablet Take 1 tablet (325 mg total) by mouth 2 (two) times daily with a meal.   furosemide 20 MG tablet Commonly known as:  LASIX Take 1 tablet (20 mg total) by mouth daily as needed for edema.   hydrochlorothiazide 25 MG tablet Commonly known as:  HYDRODIURIL Take 1 tablet (25 mg total) by mouth daily.   HYDROcodone-acetaminophen 7.5-325 MG tablet Commonly known as:  NORCO Take 1-2 tablets by mouth every 4 (four) hours as needed for severe pain (pain score 7-10).   lisinopril 40 MG tablet Commonly known as:   PRINIVIL,ZESTRIL Take 1 tablet (40 mg total) by mouth daily.   metoprolol succinate 50 MG 24 hr tablet Commonly known as:  TOPROL-XL Take 50 mg by mouth every evening.   MULTI-VITAMINS Tabs Take 1 tablet by mouth daily.   polyethylene glycol packet Commonly known as:  MIRALAX / GLYCOLAX Take 17 g by mouth daily as needed for mild constipation.   ranitidine 150 MG tablet Commonly known as:  ZANTAC Take 150 mg by mouth daily.   spironolactone 25 MG tablet Commonly known as:  ALDACTONE Take 25 mg by mouth every evening.   sucralfate 1 g tablet Commonly known as:  CARAFATE Take 1 g by mouth 2 (two) times daily.   traZODone 50 MG tablet Commonly known as:  DESYREL Take 0.5-1 tablets (25-50 mg total) by mouth at bedtime as needed for sleep.   traZODone 50 MG tablet Commonly known as:  DESYREL TAKE 0.5-1 TABLETS (25-50 MG TOTAL) AT BEDTIME AS NEEDED BY MOUTH  FOR SLEEP.   venlafaxine XR 75 MG 24 hr capsule Commonly known as:  EFFEXOR XR Take 1 capsule (75 mg total) by mouth daily with breakfast.   warfarin 5 MG tablet Commonly known as:  COUMADIN Take 2.5-5 mg by mouth See admin instructions. Take 1 tablet (5mg ) by mouth every evening 6 days a week and take 1 tablets (7.5mg ) by mouth in the evening once weekly         DISCHARGE INSTRUCTIONS:   DIET:  Cardiac diet  DISCHARGE CONDITION:  Stable  ACTIVITY:  Activity as tolerated  OXYGEN:  Home Oxygen: No.   Oxygen Delivery: room air  DISCHARGE LOCATION:  nursing home   If you experience worsening of your admission symptoms, develop shortness of breath, life threatening emergency, suicidal or homicidal thoughts you must seek medical attention immediately by calling 911 or calling your MD immediately  if symptoms less severe.  You Must read complete instructions/literature along with all the possible adverse reactions/side effects for all the Medicines you take and that have been prescribed to you. Take any new  Medicines after you have completely understood and accpet all the possible adverse reactions/side effects.   Please note  You were cared for by a hospitalist during your hospital stay. If you have any questions about your discharge medications or the care you received while you were in the hospital after you are discharged, you can call the unit and asked to speak with the hospitalist on call if the hospitalist that took care of you is not available. Once you are discharged, your primary care physician will handle any further medical issues. Please note that NO REFILLS for any discharge medications will be authorized once you are discharged, as it is imperative that you return to your primary care physician (or establish a relationship with a primary care physician if you do not have one) for your aftercare needs so that they can reassess your need for medications and monitor your lab values.     Today   Ambulating with the help of physical therapy and doing fairly well.  Denies any significant pain in her left hip.  Denies any dizziness, chest pain or any other associated symptoms.  Will discharge patient to skilled nursing facility today.  VITAL SIGNS:  Blood pressure 101/65, pulse (!) 109, temperature (!) 97.4 F (36.3 C), temperature source Oral, resp. rate 16, weight 77.1 kg, SpO2 100 %.  I/O:    Intake/Output Summary (Last 24 hours) at 06/11/2018 1328 Last data filed at 06/11/2018 0900 Gross per 24 hour  Intake 240 ml  Output -  Net 240 ml    PHYSICAL EXAMINATION:   GENERAL:  73 y.o.-year-old patient lying in bed in no acute distress.  EYES: Pupils equal, round, reactive to light and accommodation. No scleral icterus. Extraocular muscles intact.  HEENT: Head atraumatic, normocephalic. Oropharynx and nasopharynx clear.  NECK:  Supple, no jugular venous distention. No thyroid enlargement, no tenderness.  LUNGS: Normal breath sounds bilaterally, no wheezing, rales, rhonchi. No use  of accessory muscles of respiration.  CARDIOVASCULAR: S1, S2 normal. No murmurs, rubs, or gallops.  ABDOMEN: Soft, nontender, nondistended. Bowel sounds present. No organomegaly or mass.  EXTREMITIES: No cyanosis, clubbing or edema b/l. Left hip dressing in place. No acute bleeding.    NEUROLOGIC: Cranial nerves II through XII are intact. No focal Motor or sensory deficits b/l.   PSYCHIATRIC: The patient is alert and oriented x 3.  SKIN: No obvious rash, lesion, or ulcer.  DATA REVIEW:   CBC Recent Labs  Lab 06/11/18 0429  WBC 11.0*  HGB 8.2*  HCT 24.7*  PLT 189    Chemistries  Recent Labs  Lab 06/11/18 0429  NA 130*  K 4.4  CL 103  CO2 19*  GLUCOSE 154*  BUN 41*  CREATININE 1.02*  CALCIUM 7.9*    Cardiac Enzymes No results for input(s): TROPONINI in the last 168 hours.  Microbiology Results  Results for orders placed or performed in visit on 11/14/17  Microscopic Examination     Status: Abnormal   Collection Time: 11/14/17  9:56 AM  Result Value Ref Range Status   WBC, UA 6-10 (A) 0 - 5 /hpf Final   RBC, UA 0-2 0 - 2 /hpf Final   Epithelial Cells (non renal) 0-10 0 - 10 /hpf Final   Bacteria, UA Few None seen/Few Final    RADIOLOGY:  No results found.    Management plans discussed with the patient, family and they are in agreement.  CODE STATUS:  Code Status History    Date Active Date Inactive Code Status Order ID Comments User Context   06/09/2018 0111 06/09/2018 1351 Full Code [id number]  Thornton Park, MD Inpatient   TOTAL TIME TAKING CARE OF THIS PATIENT: 40 minutes.    Henreitta Leber M.D on 06/11/2018 at 1:28 PM  Between 7am to 6pm - Pager - [phone]  After 6pm go to [email or web address] - Proofreader  [facility]  Office  [phone]  CC: Primary care physician; Guadalupe Maple, MD

## 2018-06-12 ENCOUNTER — Non-Acute Institutional Stay (SKILLED_NURSING_FACILITY): Payer: Medicare Other | Admitting: Adult Health

## 2018-06-12 ENCOUNTER — Other Ambulatory Visit: Payer: Self-pay | Admitting: Adult Health

## 2018-06-12 ENCOUNTER — Encounter: Payer: Self-pay | Admitting: Adult Health

## 2018-06-12 DIAGNOSIS — E785 Hyperlipidemia, unspecified: Secondary | ICD-10-CM

## 2018-06-12 DIAGNOSIS — E119 Type 2 diabetes mellitus without complications: Secondary | ICD-10-CM

## 2018-06-12 DIAGNOSIS — S72002S Fracture of unspecified part of neck of left femur, sequela: Secondary | ICD-10-CM

## 2018-06-12 DIAGNOSIS — M8000XD Age-related osteoporosis with current pathological fracture, unspecified site, subsequent encounter for fracture with routine healing: Secondary | ICD-10-CM | POA: Diagnosis not present

## 2018-06-12 DIAGNOSIS — I10 Essential (primary) hypertension: Secondary | ICD-10-CM

## 2018-06-12 DIAGNOSIS — E1159 Type 2 diabetes mellitus with other circulatory complications: Secondary | ICD-10-CM

## 2018-06-12 DIAGNOSIS — I5032 Chronic diastolic (congestive) heart failure: Secondary | ICD-10-CM

## 2018-06-12 DIAGNOSIS — I482 Chronic atrial fibrillation, unspecified: Secondary | ICD-10-CM | POA: Diagnosis not present

## 2018-06-12 DIAGNOSIS — E1169 Type 2 diabetes mellitus with other specified complication: Secondary | ICD-10-CM

## 2018-06-12 DIAGNOSIS — Z7901 Long term (current) use of anticoagulants: Secondary | ICD-10-CM

## 2018-06-12 DIAGNOSIS — K219 Gastro-esophageal reflux disease without esophagitis: Secondary | ICD-10-CM

## 2018-06-12 DIAGNOSIS — D649 Anemia, unspecified: Secondary | ICD-10-CM

## 2018-06-12 DIAGNOSIS — I480 Paroxysmal atrial fibrillation: Secondary | ICD-10-CM | POA: Diagnosis not present

## 2018-06-12 DIAGNOSIS — F321 Major depressive disorder, single episode, moderate: Secondary | ICD-10-CM

## 2018-06-12 MED ORDER — HYDROCODONE-ACETAMINOPHEN 7.5-325 MG PO TABS: 1.0000 | ORAL_TABLET | ORAL | 0 refills | Status: DC | PRN

## 2018-06-12 NOTE — Progress Notes (Signed)
Location:   [place] [facility] Room Number: [place] of Service:  SNF (31)   CODE STATUS: Full Code  Allergies  Allergen Reactions  . Penicillins     Yeast infections Has patient had a PCN reaction causing immediate rash, facial/tongue/throat swelling, SOB or lightheadedness with hypotension: No Has patient had a PCN reaction causing severe rash involving mucus membranes or skin necrosis: No Has patient had a PCN reaction that required hospitalization: No Has patient had a PCN reaction occurring within the last 10 years: No If all of the above answers are "NO", then may proceed with Cephalosporin use.   Marland Kitchen Penicillin V Potassium Other (See Comments)    Yeast infection  . Protonix [Pantoprazole Sodium] Rash  . Sulfa Antibiotics     Yeast infections  Other reaction(s): Other (See Comments) Yeast infection    Chief Complaint  Patient presents with  . Hospitalization Follow-up    Hospital follow up    HPI:  She is a 73 year old woman who had a fall at home and suffered a left hip fracture. She is here for short term rehab with her goal to return home. She denies any uncontrolled hip pain. She states that she is sleeping well at night; no palpitations. She denies any constipation. She will continue to be followed for her chronic illnesses including: afib; dyslipidemia; hypertension.   Past Medical History:  Diagnosis Date  . Anemia   . Aortic valve regurgitation   . Arthritis   . Atrial fibrillation ([place])   . Cancer ([place]) 12/2016   Basal Cell Skin Cancer  . Cardiomyopathy ([place])   . CHF (congestive heart failure) ([place])   . CKD (chronic kidney disease)   . Colitis   . Diverticulosis   . Dyspnea   . Dysrhythmia   . GERD (gastroesophageal reflux disease)   . GI bleed   . Heart murmur   . History of hiatal hernia   . Hypercholesterolemia   . Hyperlipidemia   . Hypertension   . Insomnia   . Osteopenia   . Pre-diabetes   . Urinary, incontinence,  stress female   . Vertigo     Past Surgical History:  Procedure Laterality Date  . ANKLE SURGERY Right 1997   Fractured ankle  . COLONOSCOPY    . COLONOSCOPY WITH PROPOFOL N/A 02/08/2015   Procedure: COLONOSCOPY WITH PROPOFOL;  Surgeon: Manya Silvas, MD;  Location: [facility] ENDOSCOPY;  Service: Endoscopy;  Laterality: N/A;  . COLONOSCOPY WITH PROPOFOL N/A 08/20/2017   Procedure: COLONOSCOPY WITH PROPOFOL;  Surgeon: Manya Silvas, MD;  Location: [facility] ENDOSCOPY;  Service: Endoscopy;  Laterality: N/A;  . ESOPHAGOGASTRODUODENOSCOPY (EGD) WITH PROPOFOL N/A 08/20/2017   Procedure: ESOPHAGOGASTRODUODENOSCOPY (EGD) WITH PROPOFOL;  Surgeon: Manya Silvas, MD;  Location: [facility] ENDOSCOPY;  Service: Endoscopy;  Laterality: N/A;  . INTRAMEDULLARY (IM) NAIL INTERTROCHANTERIC Left 06/09/2018   Procedure: INTRAMEDULLARY (IM) NAIL INTERTROCHANTRIC;  Surgeon: Thornton Park, MD;  Location: [facility] ORS;  Service: Orthopedics;  Laterality: Left;  . KNEE ARTHROPLASTY Left 02/12/2017   Procedure: COMPUTER ASSISTED TOTAL KNEE ARTHROPLASTY;  Surgeon: Dereck Leep, MD;  Location: [facility] ORS;  Service: Orthopedics;  Laterality: Left;  . RADIAL HEAD ARTHROPLASTY Left 06/21/2017   Procedure: RADIAL HEAD ARTHROPLASTY;  Surgeon: Corky Mull, MD;  Location: [facility] ORS;  Service: Orthopedics;  Laterality: Left;  . SACROCOCCYGEAL ULCER REMOVAL    . TONSILLECTOMY    . TOTAL KNEE ARTHROPLASTY Left     Social History  Socioeconomic History  . Marital status: Married    Spouse name: Not on file  . Number of children: Not on file  . Years of education: Not on file  . Highest education level: 12th grade  Occupational History  . Not on file  Social Needs  . Financial resource strain: Not hard at all  . Food insecurity:    Worry: Never true    Inability: Never true  . Transportation needs:    Medical: No    Non-medical: No  Tobacco Use  . Smoking status: Never Smoker  . Smokeless tobacco: Never Used    Substance and Sexual Activity  . Alcohol use: No  . Drug use: No  . Sexual activity: Not on file  Lifestyle  . Physical activity:    Days per week: 0 days    Minutes per session: 0 min  . Stress: Not at all  Relationships  . Social connections:    Talks on phone: More than three times a week    Gets together: More than three times a week    Attends religious service: More than 4 times per year    Active member of club or organization: No    Attends meetings of clubs or organizations: Never    Relationship status: Married  . Intimate partner violence:    Fear of current or ex partner: No    Emotionally abused: No    Physically abused: No    Forced sexual activity: No  Other Topics Concern  . Not on file  Social History Narrative   Independent at baseline.  Lives at home with family   Family History  Problem Relation Age of Onset  . Heart disease Mother   . Diabetes Mother   . Heart disease Father   . Parkinson's disease Father   . Diabetes Brother   . Cancer Brother        Colon      VITAL SIGNS BP 105/63   Pulse 88   Temp 98.3 F (36.8 C)   Resp 20   Ht 5\' 3"  (1.6 m)   Wt 183 lb (83 kg)   SpO2 96%   BMI 32.42 kg/m   Outpatient Encounter Medications as of 06/12/2018  Medication Sig  . acetaminophen (TYLENOL) 650 MG CR tablet Take 1,300 mg by mouth at bedtime.  Marland Kitchen atorvastatin (LIPITOR) 10 MG tablet Take 10 mg by mouth at bedtime.  Marland Kitchen diltiazem (DILACOR XR) 180 MG 24 hr capsule Take 180 mg by mouth daily.  . diphenhydrAMINE (BENADRYL) 25 MG tablet Take 25 mg by mouth 2 (two) times daily as needed for allergies.  Marland Kitchen docusate sodium (COLACE) 100 MG capsule Take 1 capsule (100 mg total) by mouth 2 (two) times daily as needed for mild constipation.  . enoxaparin (LOVENOX) 80 MG/0.8ML injection Inject 0.75 mLs (75 mg total) into the skin every 12 (twelve) hours for 4 days.  . ferrous sulfate 325 (65 FE) MG tablet Take 1 tablet (325 mg total) by mouth 2 (two) times  daily with a meal.  . furosemide (LASIX) 20 MG tablet Take 1 tablet (20 mg total) by mouth daily as needed for edema.  . hydrochlorothiazide (HYDRODIURIL) 25 MG tablet Take 1 tablet (25 mg total) by mouth daily.  Marland Kitchen HYDROcodone-acetaminophen (NORCO) 7.5-325 MG tablet Take 1-2 tablets by mouth every 4 (four) hours as needed for severe pain (1 tab for 5-7 and 2 tabs for 8-10/10).  Marland Kitchen lisinopril (PRINIVIL,ZESTRIL) 40 MG tablet Take  1 tablet (40 mg total) by mouth daily.  . metoprolol succinate (TOPROL-XL) 50 MG 24 hr tablet Take 50 mg by mouth every evening.   . Multiple Vitamin (MULTI-VITAMINS) TABS Take 1 tablet by mouth daily.  . polyethylene glycol (MIRALAX / GLYCOLAX) packet Take 17 g by mouth daily as needed for mild constipation.  . ranitidine (ZANTAC) 150 MG tablet Take 150 mg by mouth daily.  Marland Kitchen spironolactone (ALDACTONE) 25 MG tablet Take 25 mg by mouth every evening.   . sucralfate (CARAFATE) 1 G tablet Take 1 g by mouth 2 (two) times daily.   . traZODone (DESYREL) 50 MG tablet Take 0.5-1 tablets (25-50 mg total) by mouth at bedtime as needed for sleep.  Marland Kitchen venlafaxine XR (EFFEXOR XR) 75 MG 24 hr capsule Take 1 capsule (75 mg total) by mouth daily with breakfast.  . warfarin (COUMADIN) 5 MG tablet Take 2.5-5 mg by mouth See admin instructions. Take 1 tablet (5mg ) by mouth every evening 6 days a week and take 1 tablets (7.5mg ) by mouth in the evening once weekly  . [DISCONTINUED] atorvastatin (LIPITOR) 10 MG tablet TAKE 1 TABLET BY MOUTH AT BEDTIME FOR CHOLESTEROL (Patient not taking: Reported on 06/12/2018)  . [DISCONTINUED] traZODone (DESYREL) 50 MG tablet TAKE 0.5-1 TABLETS (25-50 MG TOTAL) AT BEDTIME AS NEEDED BY MOUTH FOR SLEEP. (Patient not taking: Reported on 06/12/2018)   No facility-administered encounter medications on file as of 06/12/2018.      SIGNIFICANT DIAGNOSTIC EXAMS  LABS REVIEWED:   06-10-18: wbc 13.5; hgb 8.8; hct 26.8; mcv 99.6; plt 185; glucose 195; bun 33; creat 1.08;  k+ 5.0; na++ 130; ca 8.0  06-11-18: wbc 11.0; hgb 8.2; hct 24.7; mcv 98.4 ;plt 189; glucose 154; bun 41; creat 1.02; k+ 4.4; na++ 130; ca 7.9    Review of Systems  Constitutional: Negative for malaise/fatigue.  Respiratory: Negative for cough and shortness of breath.   Cardiovascular: Negative for chest pain, palpitations and leg swelling.  Gastrointestinal: Negative for abdominal pain, constipation and heartburn.  Musculoskeletal: Negative for back pain, joint pain and myalgias.  Skin: Negative.   Neurological: Negative for dizziness.  Psychiatric/Behavioral: The patient is not nervous/anxious.     Physical Exam Constitutional:      General: She is not in acute distress.    Appearance: She is well-developed. She is not diaphoretic.  Neck:     Musculoskeletal: Neck supple.     Thyroid: No thyromegaly.  Cardiovascular:     Rate and Rhythm: Normal rate and regular rhythm.     Heart sounds: Normal heart sounds.  Pulmonary:     Effort: Pulmonary effort is normal. No respiratory distress.     Breath sounds: Normal breath sounds.  Abdominal:     General: Bowel sounds are normal. There is no distension.     Palpations: Abdomen is soft.     Tenderness: There is no abdominal tenderness.  Musculoskeletal:     Right lower leg: No edema.     Left lower leg: No edema.     Comments: Is able to move all extremities  Is status post left hip fracture  Lymphadenopathy:     Cervical: No cervical adenopathy.  Skin:    General: Skin is warm and dry.     Comments: Incision line without signs of infection present   Neurological:     Mental Status: She is alert and oriented to person, place, and time.  Psychiatric:        Mood and Affect:  Mood normal.    ASSESSMENT/ PLAN:   TODAY:   1. Paroxysmal atrial fibrillation: heart rate is stable will continue toprol xl 05 mg daily diltiazem xr 180 mg daily is on long term coumadin therapy will check INR 06-14-18  2. Hypertension associated with  diabetes; is stable 105/63: will continue toprol xl 50 mg daily hctz 25 mg daily and lisinopril 40 mg daily   3. Dyslipidemia associated with type 2 diabetes mellitus: is stable will continue lipitor 10 mg daily   4. Chronic diastolic heart failure: is stable will continue aldactone 25 mg daily toprol xl 50 mg daily and lasix 20 mg daily as needed  5.  Type 2 diabetes mellitus without complication with no history of insulin use: is stable is on ace; statin and coumadin  6. GERD without esophagitis: is stable will continue zantac 150 mg daily and carafate 1 gm  twice daily   7. Acute on chronic anemia: is without change hgb 8.8; will continue iron twice daily   8. Depression major single episode moderate: is stable effexor xr 75 mg daily   9. Closed left hip fracture: is stable will continue therapy as directed and will follow up with orthopedics; will continue tylenol 1300 mg nightly and vicodin 7.5/325 mg 1 or 2 tabs every 4 hours as needed   10. Long term anticoagulation therapy: will continue lovenox 80 mg twice daily and coumadin will check INR on 06-14-18.         MD is aware of resident's narcotic use and is in agreement with current plan of care. We will attempt to wean resident as apropriate   Ok Edwards NP [facility]  Contact [phone] Monday through Friday 8am- 5pm  After hours call [phone]

## 2018-06-14 ENCOUNTER — Other Ambulatory Visit
Admission: RE | Admit: 2018-06-14 | Discharge: 2018-06-14 | Disposition: A | Discharge status: Home / Self Care | Payer: Medicare Other | Source: Ambulatory Visit | Attending: Adult Health | Admitting: Adult Health

## 2018-06-14 ENCOUNTER — Encounter: Payer: Self-pay | Admitting: Adult Health

## 2018-06-14 ENCOUNTER — Non-Acute Institutional Stay (SKILLED_NURSING_FACILITY): Payer: Medicare Other | Admitting: Adult Health

## 2018-06-14 DIAGNOSIS — D649 Anemia, unspecified: Secondary | ICD-10-CM | POA: Insufficient documentation

## 2018-06-14 DIAGNOSIS — Z7901 Long term (current) use of anticoagulants: Secondary | ICD-10-CM

## 2018-06-14 DIAGNOSIS — E1169 Type 2 diabetes mellitus with other specified complication: Secondary | ICD-10-CM | POA: Insufficient documentation

## 2018-06-14 DIAGNOSIS — I480 Paroxysmal atrial fibrillation: Secondary | ICD-10-CM | POA: Diagnosis not present

## 2018-06-14 DIAGNOSIS — E785 Hyperlipidemia, unspecified: Secondary | ICD-10-CM

## 2018-06-14 DIAGNOSIS — E1159 Type 2 diabetes mellitus with other circulatory complications: Secondary | ICD-10-CM | POA: Insufficient documentation

## 2018-06-14 DIAGNOSIS — I10 Essential (primary) hypertension: Secondary | ICD-10-CM

## 2018-06-14 DIAGNOSIS — I4891 Unspecified atrial fibrillation: Secondary | ICD-10-CM | POA: Insufficient documentation

## 2018-06-14 DIAGNOSIS — K219 Gastro-esophageal reflux disease without esophagitis: Secondary | ICD-10-CM | POA: Insufficient documentation

## 2018-06-14 DIAGNOSIS — I152 Hypertension secondary to endocrine disorders: Secondary | ICD-10-CM | POA: Insufficient documentation

## 2018-06-14 LAB — CBC WITH DIFFERENTIAL/PLATELET
Abs Immature Granulocytes: 0.1 10*3/uL — ABNORMAL HIGH (ref 0.00–0.07)
BASOS ABS: 0 10*3/uL (ref 0.0–0.1)
Basophils Relative: 0 %
Eosinophils Absolute: 0.1 10*3/uL (ref 0.0–0.5)
Eosinophils Relative: 0 %
HCT: 25.3 % — ABNORMAL LOW (ref 36.0–46.0)
Hemoglobin: 8.3 g/dL — ABNORMAL LOW (ref 12.0–15.0)
Immature Granulocytes: 1 %
Lymphocytes Relative: 7 %
Lymphs Abs: 1 10*3/uL (ref 0.7–4.0)
MCH: 33.1 pg (ref 26.0–34.0)
MCHC: 32.8 g/dL (ref 30.0–36.0)
MCV: 100.8 fL — ABNORMAL HIGH (ref 80.0–100.0)
Monocytes Absolute: 0.8 10*3/uL (ref 0.1–1.0)
Monocytes Relative: 6 %
Neutro Abs: 12.2 10*3/uL — ABNORMAL HIGH (ref 1.7–7.7)
Neutrophils Relative %: 86 %
Platelets: 348 10*3/uL (ref 150–400)
RBC: 2.51 MIL/uL — AB (ref 3.87–5.11)
RDW: 13.4 % (ref 11.5–15.5)
WBC: 14.1 10*3/uL — ABNORMAL HIGH (ref 4.0–10.5)
nRBC: 0.2 % (ref 0.0–0.2)

## 2018-06-14 LAB — COMPREHENSIVE METABOLIC PANEL
ALT: 37 U/L (ref 0–44)
AST: 39 U/L (ref 15–41)
Albumin: 3.2 g/dL — ABNORMAL LOW (ref 3.5–5.0)
Alkaline Phosphatase: 84 U/L (ref 38–126)
Anion gap: 10 (ref 5–15)
BUN: 33 mg/dL — ABNORMAL HIGH (ref 8–23)
CHLORIDE: 94 mmol/L — AB (ref 98–111)
CO2: 22 mmol/L (ref 22–32)
Calcium: 8.3 mg/dL — ABNORMAL LOW (ref 8.9–10.3)
Creatinine, Ser: 0.99 mg/dL (ref 0.44–1.00)
GFR calc Af Amer: >60 mL/min (ref >60)
GFR, EST NON AFRICAN AMERICAN: 57 mL/min — AB (ref >60)
Glucose, Bld: 114 mg/dL — ABNORMAL HIGH (ref 70–99)
Potassium: 4.8 mmol/L (ref 3.5–5.1)
Sodium: 126 mmol/L — ABNORMAL LOW (ref 135–145)
Total Bilirubin: 1.1 mg/dL (ref 0.3–1.2)
Total Protein: 6.4 g/dL — ABNORMAL LOW (ref 6.5–8.1)

## 2018-06-14 LAB — PROTIME-INR
INR: 1.22
INR: 1.35
PROTHROMBIN TIME: 15.3 s — AB (ref 11.4–15.2)
PROTHROMBIN TIME: 16.5 s — AB (ref 11.4–15.2)

## 2018-06-14 NOTE — Progress Notes (Signed)
Location:   [facility] Room Number: [place] of Service:      CODE STATUS: Full Code  Allergies  Allergen Reactions  . Penicillins     Yeast infections Has patient had a PCN reaction causing immediate rash, facial/tongue/throat swelling, SOB or lightheadedness with hypotension: No Has patient had a PCN reaction causing severe rash involving mucus membranes or skin necrosis: No Has patient had a PCN reaction that required hospitalization: No Has patient had a PCN reaction occurring within the last 10 years: No If all of the above answers are "NO", then may proceed with Cephalosporin use.   Marland Kitchen Penicillin V Potassium Other (See Comments)    Yeast infection  . Protonix [Pantoprazole Sodium] Rash  . Sulfa Antibiotics     Yeast infections  Other reaction(s): Other (See Comments) Yeast infection    Chief Complaint  Patient presents with  . Acute Visit    INR follow up    HPI:  She is on long term coumadin therapy for afib. There are no reports of missed doses of coumadin. She continues on therapeutic lovenox therapy. Her heart rate today is elevated in the 130's; she is having palpitations; and is short of breath. Her INR today is 1.27    Past Medical History:  Diagnosis Date  . Anemia   . Aortic valve regurgitation   . Arthritis   . Atrial fibrillation ([place])   . Cancer ([place]) 12/2016   Basal Cell Skin Cancer  . Cardiomyopathy ([place])   . CHF (congestive heart failure) ([place])   . CKD (chronic kidney disease)   . Colitis   . Diverticulosis   . Dyspnea   . Dysrhythmia   . GERD (gastroesophageal reflux disease)   . GI bleed   . Heart murmur   . History of hiatal hernia   . Hypercholesterolemia   . Hyperlipidemia   . Hypertension   . Insomnia   . Osteopenia   . Pre-diabetes   . Urinary, incontinence, stress female   . Vertigo     Past Surgical History:  Procedure Laterality Date  . ANKLE SURGERY Right 1997   Fractured ankle  .  COLONOSCOPY    . COLONOSCOPY WITH PROPOFOL N/A 02/08/2015   Procedure: COLONOSCOPY WITH PROPOFOL;  Surgeon: Manya Silvas, MD;  Location[facility] ENDOSCOPY;  Service: Endoscopy;  Laterality: N/A;  . COLONOSCOPY WITH PROPOFOL N/A 08/20/2017   Procedure: COLONOSCOPY WITH PROPOFOL;  Surgeon: Manya Silvas, MD;  Location: [facility] ENDOSCOPY;  Service: Endoscopy;  Laterality: N/A;  . ESOPHAGOGASTRODUODENOSCOPY (EGD) WITH PROPOFOL N/A 08/20/2017   Procedure: ESOPHAGOGASTRODUODENOSCOPY (EGD) WITH PROPOFOL;  Surgeon: Manya Silvas, MD;  Location: [facility] ENDOSCOPY;  Service: Endoscopy;  Laterality: N/A;  . INTRAMEDULLARY (IM) NAIL INTERTROCHANTERIC Left 06/09/2018   Procedure: INTRAMEDULLARY (IM) NAIL INTERTROCHANTRIC;  Surgeon: Thornton Park, MD;  Location: [facility] ORS;  Service: Orthopedics;  Laterality: Left;  . KNEE ARTHROPLASTY Left 02/12/2017   Procedure: COMPUTER ASSISTED TOTAL KNEE ARTHROPLASTY;  Surgeon: Dereck Leep, MD;  Location: [facility] ORS;  Service: Orthopedics;  Laterality: Left;  . RADIAL HEAD ARTHROPLASTY Left 06/21/2017   Procedure: RADIAL HEAD ARTHROPLASTY;  Surgeon: Corky Mull, MD;  Location: [facility] ORS;  Service: Orthopedics;  Laterality: Left;  . SACROCOCCYGEAL ULCER REMOVAL    . TONSILLECTOMY    . TOTAL KNEE ARTHROPLASTY Left     Social History   Socioeconomic History  . Marital status: Married    Spouse name: Not on file  .  Number of children: Not on file  . Years of education: Not on file  . Highest education level: 12th grade  Occupational History  . Not on file  Social Needs  . Financial resource strain: Not hard at all  . Food insecurity:    Worry: Never true    Inability: Never true  . Transportation needs:    Medical: No    Non-medical: No  Tobacco Use  . Smoking status: Never Smoker  . Smokeless tobacco: Never Used  Substance and Sexual Activity  . Alcohol use: No  . Drug use: No  . Sexual activity: Not on file  Lifestyle  . Physical activity:    Days  per week: 0 days    Minutes per session: 0 min  . Stress: Not at all  Relationships  . Social connections:    Talks on phone: More than three times a week    Gets together: More than three times a week    Attends religious service: More than 4 times per year    Active member of club or organization: No    Attends meetings of clubs or organizations: Never    Relationship status: Married  . Intimate partner violence:    Fear of current or ex partner: No    Emotionally abused: No    Physically abused: No    Forced sexual activity: No  Other Topics Concern  . Not on file  Social History Narrative   Independent at baseline.  Lives at home with family   Family History  Problem Relation Age of Onset  . Heart disease Mother   . Diabetes Mother   . Heart disease Father   . Parkinson's disease Father   . Diabetes Brother   . Cancer Brother        Colon      VITAL SIGNS BP 106/73   Pulse (!) 130   Temp 97.8 F (36.6 C)   Resp 18   Ht 5\' 3"  (1.6 m)   Wt 183 lb 6.4 oz (83.2 kg)   SpO2 100%   BMI 32.49 kg/m   Outpatient Encounter Medications as of 06/14/2018  Medication Sig  . acetaminophen (TYLENOL) 650 MG CR tablet Take 1,300 mg by mouth at bedtime.  Marland Kitchen atorvastatin (LIPITOR) 10 MG tablet Take 10 mg by mouth at bedtime.  Marland Kitchen diltiazem (DILACOR XR) 180 MG 24 hr capsule Take 180 mg by mouth daily.  . diphenhydrAMINE (BENADRYL) 25 MG tablet Take 25 mg by mouth 2 (two) times daily as needed for allergies.  Marland Kitchen docusate sodium (COLACE) 100 MG capsule Take 1 capsule (100 mg total) by mouth 2 (two) times daily as needed for mild constipation.  . enoxaparin (LOVENOX) 80 MG/0.8ML injection Inject 0.75 mLs (75 mg total) into the skin every 12 (twelve) hours for 4 days.  . ferrous sulfate 325 (65 FE) MG tablet Take 1 tablet (325 mg total) by mouth 2 (two) times daily with a meal.  . furosemide (LASIX) 20 MG tablet Take 1 tablet (20 mg total) by mouth daily as needed for edema.  .  hydrochlorothiazide (HYDRODIURIL) 25 MG tablet Take 1 tablet (25 mg total) by mouth daily.  Marland Kitchen HYDROcodone-acetaminophen (NORCO) 7.5-325 MG tablet Take 1-2 tablets by mouth every 4 (four) hours as needed for severe pain (1 tab for 5-7 and 2 tabs for 8-10/10).  Marland Kitchen lisinopril (PRINIVIL,ZESTRIL) 40 MG tablet Take 1 tablet (40 mg total) by mouth daily.  . metoprolol succinate (TOPROL-XL) 50 MG  24 hr tablet Take 50 mg by mouth every evening.   . Multiple Vitamin (MULTI-VITAMINS) TABS Take 1 tablet by mouth daily.  . NON FORMULARY Diet Type: NAS  . polyethylene glycol (MIRALAX / GLYCOLAX) packet Take 17 g by mouth daily as needed for mild constipation.  . ranitidine (ZANTAC) 150 MG tablet Take 150 mg by mouth daily.  Marland Kitchen spironolactone (ALDACTONE) 25 MG tablet Take 25 mg by mouth every evening.   . sucralfate (CARAFATE) 1 G tablet Take 1 g by mouth 2 (two) times daily.   . traZODone (DESYREL) 50 MG tablet Take 0.5-1 tablets (25-50 mg total) by mouth at bedtime as needed for sleep.  Marland Kitchen venlafaxine XR (EFFEXOR XR) 75 MG 24 hr capsule Take 1 capsule (75 mg total) by mouth daily with breakfast.  . warfarin (COUMADIN) 5 MG tablet Give 1 tablet by mouth daily on Monday through Saturday  . warfarin (COUMADIN) 7.5 MG tablet Give one tablet by mouth once daily on Sunday  . [DISCONTINUED] warfarin (COUMADIN) 5 MG tablet Take 2.5-5 mg by mouth See admin instructions. Take 1 tablet (5mg ) by mouth every evening 6 days a week and take 1 tablets (7.5mg ) by mouth in the evening once weekly   No facility-administered encounter medications on file as of 06/14/2018.      SIGNIFICANT DIAGNOSTIC EXAMS   LABS REVIEWED PREVIOUS:   06-10-18: wbc 13.5; hgb 8.8; hct 26.8; mcv 99.6; plt 185; glucose 195; bun 33; creat 1.08; k+ 5.0; na++ 130; ca 8.0  06-11-18: wbc 11.0; hgb 8.2; hct 24.7; mcv 98.4 ;plt 189; glucose 154; bun 41; creat 1.02; k+ 4.4; na++ 130; ca 7.9   TODAY:   06-14-18; INR 1.27    Review of Systems    Constitutional: Negative for malaise/fatigue.  Respiratory: Positive for shortness of breath. Negative for cough.   Cardiovascular: Positive for palpitations. Negative for chest pain and leg swelling.  Gastrointestinal: Negative for abdominal pain, constipation and heartburn.  Musculoskeletal: Negative for back pain, joint pain and myalgias.  Skin: Negative.   Neurological: Negative for dizziness.  Psychiatric/Behavioral: The patient is not nervous/anxious.     Physical Exam Constitutional:      General: She is not in acute distress.    Appearance: She is well-developed. She is not diaphoretic.  Neck:     Musculoskeletal: Neck supple.     Thyroid: No thyromegaly.  Cardiovascular:     Rate and Rhythm: Tachycardia present. Rhythm irregular.     Pulses: Normal pulses.     Heart sounds: Normal heart sounds.  Pulmonary:     Effort: Pulmonary effort is normal. No respiratory distress.     Breath sounds: Rhonchi present.     Comments: In upper lobes  Abdominal:     General: Bowel sounds are normal. There is no distension.     Palpations: Abdomen is soft.     Tenderness: There is no abdominal tenderness.  Musculoskeletal:     Right lower leg: No edema.     Left lower leg: No edema.     Comments: Is able to move all extremities  Is status post left hip fracture.   Lymphadenopathy:     Cervical: No cervical adenopathy.  Skin:    General: Skin is warm and dry.     Comments: Hip incision line without signs of infection present.   Neurological:     Mental Status: She is alert and oriented to person, place, and time.  Psychiatric:  Mood and Affect: Mood normal.     ASSESSMENT/ PLAN:  TODAY:   1. Atrial fibrillation 2. Current use of long term anticoagulation  For INR 1.27 give coumadin 6 mg nightly  Will recheck INR on 06-17-18 Will increase diltiazem to 240 mg daily  Will continue lovenox therapy    MD is aware of resident's narcotic use and is in agreement with  current plan of care. We will attempt to wean resident as apropriate   Ok Edwards NP [facility]  Contact [phone] Monday through Friday 8am- 5pm  After hours call [phone]

## 2018-06-17 ENCOUNTER — Non-Acute Institutional Stay (SKILLED_NURSING_FACILITY): Payer: Medicare Other | Admitting: Adult Health

## 2018-06-17 ENCOUNTER — Encounter: Payer: Self-pay | Admitting: Adult Health

## 2018-06-17 ENCOUNTER — Other Ambulatory Visit
Admission: RE | Admit: 2018-06-17 | Discharge: 2018-06-17 | Disposition: A | Discharge status: Home / Self Care | Payer: Medicare Other | Source: Ambulatory Visit | Attending: Adult Health | Admitting: Adult Health

## 2018-06-17 DIAGNOSIS — I480 Paroxysmal atrial fibrillation: Secondary | ICD-10-CM | POA: Diagnosis not present

## 2018-06-17 DIAGNOSIS — Z7901 Long term (current) use of anticoagulants: Secondary | ICD-10-CM

## 2018-06-17 DIAGNOSIS — I4891 Unspecified atrial fibrillation: Secondary | ICD-10-CM | POA: Insufficient documentation

## 2018-06-17 LAB — PROTIME-INR
INR: 1.4
Prothrombin Time: 17 seconds — ABNORMAL HIGH (ref 11.4–15.2)

## 2018-06-17 NOTE — Progress Notes (Signed)
[facility]:   [facility] Room Number: [place] of Service:  SNF ([id number])   CODE STATUS: Full Code  Allergies  Allergen Reactions  . Penicillins     Yeast infections Has patient had a PCN reaction causing immediate rash, facial/tongue/throat swelling, SOB or lightheadedness with hypotension: No Has patient had a PCN reaction causing severe rash involving mucus membranes or skin necrosis: No Has patient had a PCN reaction that required hospitalization: No Has patient had a PCN reaction occurring within the last 10 years: No If all of the above answers are "NO", then may proceed with Cephalosporin use.   Marland Kitchen Penicillin V Potassium Other (See Comments)    Yeast infection  . Protonix [Pantoprazole Sodium] Rash  . Sulfa Antibiotics     Yeast infections  Other reaction(s): Other (See Comments) Yeast infection    Chief Complaint  Patient presents with  . Acute Visit    INR Follow up    HPI:  She is long term coumadin therapy for afib management. There are no reports of missed doses; she is tolerating medication easily. Her INR today is 1.4; the goal INR is 2-3. She denies any chest pain no palpitations; no heart burn.   Past Medical History:  Diagnosis Date  . Anemia   . Aortic valve regurgitation   . Arthritis   . Atrial fibrillation ([place])   . Cancer ([place]) 12/2016   Basal Cell Skin Cancer  . Cardiomyopathy ([place])   . CHF (congestive heart failure) ([place])   . CKD (chronic kidney disease)   . Colitis   . Diverticulosis   . Dyspnea   . Dysrhythmia   . GERD (gastroesophageal reflux disease)   . GI bleed   . Heart murmur   . History of hiatal hernia   . Hypercholesterolemia   . Hyperlipidemia   . Hypertension   . Insomnia   . Osteopenia   . Pre-diabetes   . Urinary, incontinence, stress female   . Vertigo     Past Surgical History:  Procedure Laterality Date  . ANKLE SURGERY Right 1997   Fractured ankle  . COLONOSCOPY    . COLONOSCOPY  WITH PROPOFOL N/A 02/08/2015   Procedure: COLONOSCOPY WITH PROPOFOL;  Surgeon: Manya Silvas, MD;  Location: [facility] ENDOSCOPY;  Service: Endoscopy;  Laterality: N/A;  . COLONOSCOPY WITH PROPOFOL N/A 08/20/2017   Procedure: COLONOSCOPY WITH PROPOFOL;  Surgeon: Manya Silvas, MD;  Location: [facility] ENDOSCOPY;  Service: Endoscopy;  Laterality: N/A;  . ESOPHAGOGASTRODUODENOSCOPY (EGD) WITH PROPOFOL N/A 08/20/2017   Procedure: ESOPHAGOGASTRODUODENOSCOPY (EGD) WITH PROPOFOL;  Surgeon: Manya Silvas, MD;  Location: [facility] ENDOSCOPY;  Service: Endoscopy;  Laterality: N/A;  . INTRAMEDULLARY (IM) NAIL INTERTROCHANTERIC Left 06/09/2018   Procedure: INTRAMEDULLARY (IM) NAIL INTERTROCHANTRIC;  Surgeon: Thornton Park, MD;  Location: [facility] ORS;  Service: Orthopedics;  Laterality: Left;  . KNEE ARTHROPLASTY Left 02/12/2017   Procedure: COMPUTER ASSISTED TOTAL KNEE ARTHROPLASTY;  Surgeon: Dereck Leep, MD;  Location: [facility] ORS;  Service: Orthopedics;  Laterality: Left;  . RADIAL HEAD ARTHROPLASTY Left 06/21/2017   Procedure: RADIAL HEAD ARTHROPLASTY;  Surgeon: Corky Mull, MD;  Location: [facility] ORS;  Service: Orthopedics;  Laterality: Left;  . SACROCOCCYGEAL ULCER REMOVAL    . TONSILLECTOMY    . TOTAL KNEE ARTHROPLASTY Left     Social History   Socioeconomic History  . Marital status: Married    Spouse name: Not on file  . Number of children: Not on file  .  Years of education: Not on file  . Highest education level: 12th grade  Occupational History  . Not on file  Social Needs  . Financial resource strain: Not hard at all  . Food insecurity:    Worry: Never true    Inability: Never true  . Transportation needs:    Medical: No    Non-medical: No  Tobacco Use  . Smoking status: Never Smoker  . Smokeless tobacco: Never Used  Substance and Sexual Activity  . Alcohol use: No  . Drug use: No  . Sexual activity: Not on file  Lifestyle  . Physical activity:    Days per week: 0 days    Minutes  per session: 0 min  . Stress: Not at all  Relationships  . Social connections:    Talks on phone: More than three times a week    Gets together: More than three times a week    Attends religious service: More than 4 times per year    Active member of club or organization: No    Attends meetings of clubs or organizations: Never    Relationship status: Married  . Intimate partner violence:    Fear of current or ex partner: No    Emotionally abused: No    Physically abused: No    Forced sexual activity: No  Other Topics Concern  . Not on file  Social History Narrative   Independent at baseline.  Lives at home with family   Family History  Problem Relation Age of Onset  . Heart disease Mother   . Diabetes Mother   . Heart disease Father   . Parkinson's disease Father   . Diabetes Brother   . Cancer Brother        Colon      VITAL SIGNS BP 117/63   Pulse 66   Temp 98.7 F (37.1 C)   Resp 20   Ht 5\' 3"  (1.6 m)   Wt 171 lb 11.2 oz (77.9 kg)   SpO2 99%   BMI 30.42 kg/m   Outpatient Encounter Medications as of 06/17/2018  Medication Sig  . acetaminophen (TYLENOL) 650 MG CR tablet Take 1,300 mg by mouth at bedtime.  Marland Kitchen atorvastatin (LIPITOR) 10 MG tablet Take 10 mg by mouth at bedtime.  Marland Kitchen diltiazem (DILACOR XR) 240 MG 24 hr capsule Take 240 mg by mouth daily.   . diphenhydrAMINE (BENADRYL) 25 MG tablet Take 25 mg by mouth 2 (two) times daily as needed for allergies.  Marland Kitchen docusate sodium (COLACE) 100 MG capsule Take 1 capsule (100 mg total) by mouth 2 (two) times daily as needed for mild constipation.  . ferrous sulfate 325 (65 FE) MG tablet Take 1 tablet (325 mg total) by mouth 2 (two) times daily with a meal.  . furosemide (LASIX) 20 MG tablet Take 1 tablet (20 mg total) by mouth daily as needed for edema.  . hydrochlorothiazide (HYDRODIURIL) 25 MG tablet Take 1 tablet (25 mg total) by mouth daily.  Marland Kitchen HYDROcodone-acetaminophen (NORCO) 7.5-325 MG tablet Take 1-2 tablets by  mouth every 4 (four) hours as needed for severe pain (1 tab for 5-7 and 2 tabs for 8-10/10).  Marland Kitchen lisinopril (PRINIVIL,ZESTRIL) 40 MG tablet Take 1 tablet (40 mg total) by mouth daily.  . metoprolol succinate (TOPROL-XL) 50 MG 24 hr tablet Take 50 mg by mouth every evening.   . Multiple Vitamin (MULTI-VITAMINS) TABS Take 1 tablet by mouth daily.  . NON FORMULARY Diet Type: NAS  .  polyethylene glycol (MIRALAX / GLYCOLAX) packet Take 17 g by mouth daily as needed for mild constipation.  . ranitidine (ZANTAC) 150 MG tablet Take 150 mg by mouth daily.  Marland Kitchen spironolactone (ALDACTONE) 25 MG tablet Take 25 mg by mouth every evening.   . sucralfate (CARAFATE) 1 G tablet Take 1 g by mouth 2 (two) times daily.   . traZODone (DESYREL) 50 MG tablet Take 0.5-1 tablets (25-50 mg total) by mouth at bedtime as needed for sleep.  Marland Kitchen venlafaxine XR (EFFEXOR XR) 75 MG 24 hr capsule Take 1 capsule (75 mg total) by mouth daily with breakfast.  . warfarin (COUMADIN) 6 MG tablet Take 6 mg by mouth at bedtime.   . [DISCONTINUED] enoxaparin (LOVENOX) 80 MG/0.8ML injection Inject 0.75 mLs (75 mg total) into the skin every 12 (twelve) hours for 4 days.  . [DISCONTINUED] warfarin (COUMADIN) 7.5 MG tablet Give one tablet by mouth once daily on Sunday   No facility-administered encounter medications on file as of 06/17/2018.      SIGNIFICANT DIAGNOSTIC EXAMS  LABS REVIEWED PREVIOUS:   06-10-18: wbc 13.5; hgb 8.8; hct 26.8; mcv 99.6; plt 185; glucose 195; bun 33; creat 1.08; k+ 5.0; na++ 130; ca 8.0  06-11-18: wbc 11.0; hgb 8.2; hct 24.7; mcv 98.4 ;plt 189; glucose 154; bun 41; creat 1.02; k+ 4.4; na++ 130; ca 7.9   TODAY:   06-14-18; INR 1.27    Review of Systems  Constitutional: Negative for malaise/fatigue.  Respiratory: Negative for cough and shortness of breath.   Cardiovascular: Negative for chest pain, palpitations and leg swelling.  Gastrointestinal: Negative for abdominal pain, constipation and heartburn.    Musculoskeletal: Negative for back pain, joint pain and myalgias.  Skin: Negative.   Neurological: Negative for dizziness.  Psychiatric/Behavioral: The patient is not nervous/anxious.     Physical Exam Constitutional:      General: She is not in acute distress.    Appearance: She is well-developed. She is not diaphoretic.  Neck:     Musculoskeletal: Neck supple.     Thyroid: No thyromegaly.  Cardiovascular:     Rate and Rhythm: Normal rate and regular rhythm.     Pulses: Normal pulses.     Heart sounds: Normal heart sounds.  Pulmonary:     Effort: Pulmonary effort is normal. No respiratory distress.     Breath sounds: Normal breath sounds.  Abdominal:     General: Bowel sounds are normal. There is no distension.     Palpations: Abdomen is soft.     Tenderness: There is no abdominal tenderness.  Musculoskeletal:     Right lower leg: No edema.     Left lower leg: No edema.     Comments: Is able to move all extremities  Is status post left hip fracture.    Lymphadenopathy:     Cervical: No cervical adenopathy.  Skin:    General: Skin is warm and dry.     Comments: Hip incision line without signs of infection present.    Neurological:     Mental Status: She is alert and oriented to person, place, and time.  Psychiatric:        Mood and Affect: Mood normal.    ASSESSMENT/ PLAN:  TODAY:   1. Atrial fibrillation 2. Current use of long term anticoagulation  For INR 1.40 will increase coumadin to 4 mg daily and will check INR on 06-21-18.   MD is aware of resident's narcotic use and is in agreement with current  plan of care. We will attempt to wean resident as apropriate   Ok Edwards NP [facility]  Contact [phone] Monday through Friday 8am- 5pm  After hours call [phone]

## 2018-06-18 ENCOUNTER — Non-Acute Institutional Stay (SKILLED_NURSING_FACILITY): Payer: Medicare Other | Admitting: Adult Health

## 2018-06-18 ENCOUNTER — Encounter: Payer: Self-pay | Admitting: Adult Health

## 2018-06-18 DIAGNOSIS — S72002S Fracture of unspecified part of neck of left femur, sequela: Secondary | ICD-10-CM | POA: Diagnosis not present

## 2018-06-18 DIAGNOSIS — Z7901 Long term (current) use of anticoagulants: Secondary | ICD-10-CM

## 2018-06-18 DIAGNOSIS — I480 Paroxysmal atrial fibrillation: Secondary | ICD-10-CM

## 2018-06-18 NOTE — Progress Notes (Signed)
[facility]   [facility] Room Number: Holiday of Service:  SNF (31)   CODE STATUS: Full Code  Allergies  Allergen Reactions  . Penicillins     Yeast infections Has patient had a PCN reaction causing immediate rash, facial/tongue/throat swelling, SOB or lightheadedness with hypotension: No Has patient had a PCN reaction causing severe rash involving mucus membranes or skin necrosis: No Has patient had a PCN reaction that required hospitalization: No Has patient had a PCN reaction occurring within the last 10 years: No If all of the above answers are "NO", then may proceed with Cephalosporin use.   Marland Kitchen Penicillin V Potassium Other (See Comments)    Yeast infection  . Protonix [Pantoprazole Sodium] Rash  . Sulfa Antibiotics     Yeast infections  Other reaction(s): Other (See Comments) Yeast infection    Chief Complaint  Patient presents with  . Acute Visit    Care Plan Meeting    HPI:  We have come together for her routine care plan meeting. She is doing well in therapy her heart rate is better since increasing her diltiazem. She will be following up with orthopedics on Friday. She is looking to go home next week. She has a walker already and will not need dme. She denies any uncontrolled pain; no changes in appetite; no anxiety or insomnia.    Past Medical History:  Diagnosis Date  . Anemia   . Aortic valve regurgitation   . Arthritis   . Atrial fibrillation ([place])   . Cancer ([place]) 12/2016   Basal Cell Skin Cancer  . Cardiomyopathy ([place])   . CHF (congestive heart failure) ([place])   . CKD (chronic kidney disease)   . Colitis   . Diverticulosis   . Dyspnea   . Dysrhythmia   . GERD (gastroesophageal reflux disease)   . GI bleed   . Heart murmur   . History of hiatal hernia   . Hypercholesterolemia   . Hyperlipidemia   . Hypertension   . Insomnia   . Osteopenia   . Pre-diabetes   . Urinary, incontinence, stress female   . Vertigo      Past Surgical History:  Procedure Laterality Date  . ANKLE SURGERY Right 1997   Fractured ankle  . COLONOSCOPY    . COLONOSCOPY WITH PROPOFOL N/A 02/08/2015   Procedure: COLONOSCOPY WITH PROPOFOL;  Surgeon: Manya Silvas, MD;  Location: [facility] ENDOSCOPY;  Service: Endoscopy;  Laterality: N/A;  . COLONOSCOPY WITH PROPOFOL N/A 08/20/2017   Procedure: COLONOSCOPY WITH PROPOFOL;  Surgeon: Manya Silvas, MD;  Location: [facility] ENDOSCOPY;  Service: Endoscopy;  Laterality: N/A;  . ESOPHAGOGASTRODUODENOSCOPY (EGD) WITH PROPOFOL N/A 08/20/2017   Procedure: ESOPHAGOGASTRODUODENOSCOPY (EGD) WITH PROPOFOL;  Surgeon: Manya Silvas, MD;  Location: [facility] ENDOSCOPY;  Service: Endoscopy;  Laterality: N/A;  . INTRAMEDULLARY (IM) NAIL INTERTROCHANTERIC Left 06/09/2018   Procedure: INTRAMEDULLARY (IM) NAIL INTERTROCHANTRIC;  Surgeon: Thornton Park, MD;  Location: [facility] ORS;  Service: Orthopedics;  Laterality: Left;  . KNEE ARTHROPLASTY Left 02/12/2017   Procedure: COMPUTER ASSISTED TOTAL KNEE ARTHROPLASTY;  Surgeon: Dereck Leep, MD;  Location: [facility] ORS;  Service: Orthopedics;  Laterality: Left;  . RADIAL HEAD ARTHROPLASTY Left 06/21/2017   Procedure: RADIAL HEAD ARTHROPLASTY;  Surgeon: Corky Mull, MD;  Location: [facility] ORS;  Service: Orthopedics;  Laterality: Left;  . SACROCOCCYGEAL ULCER REMOVAL    . TONSILLECTOMY    . TOTAL KNEE ARTHROPLASTY Left     Social History  Socioeconomic History  . Marital status: Married    Spouse name: Not on file  . Number of children: Not on file  . Years of education: Not on file  . Highest education level: 12th grade  Occupational History  . Not on file  Social Needs  . Financial resource strain: Not hard at all  . Food insecurity:    Worry: Never true    Inability: Never true  . Transportation needs:    Medical: No    Non-medical: No  Tobacco Use  . Smoking status: Never Smoker  . Smokeless tobacco: Never Used  Substance and Sexual Activity  .  Alcohol use: No  . Drug use: No  . Sexual activity: Not on file  Lifestyle  . Physical activity:    Days per week: 0 days    Minutes per session: 0 min  . Stress: Not at all  Relationships  . Social connections:    Talks on phone: More than three times a week    Gets together: More than three times a week    Attends religious service: More than 4 times per year    Active member of club or organization: No    Attends meetings of clubs or organizations: Never    Relationship status: Married  . Intimate partner violence:    Fear of current or ex partner: No    Emotionally abused: No    Physically abused: No    Forced sexual activity: No  Other Topics Concern  . Not on file  Social History Narrative   Independent at baseline.  Lives at home with family   Family History  Problem Relation Age of Onset  . Heart disease Mother   . Diabetes Mother   . Heart disease Father   . Parkinson's disease Father   . Diabetes Brother   . Cancer Brother        Colon      VITAL SIGNS BP 117/63   Pulse 66   Temp 98.7 F (37.1 C)   Resp 20   Ht 5\' 3"  (1.6 m)   Wt 170 lb 14.4 oz (77.5 kg)   SpO2 99%   BMI 30.27 kg/m   Outpatient Encounter Medications as of 06/18/2018  Medication Sig  . acetaminophen (TYLENOL) 650 MG CR tablet Take 1,300 mg by mouth at bedtime.  Marland Kitchen atorvastatin (LIPITOR) 10 MG tablet Take 10 mg by mouth at bedtime.  Marland Kitchen diltiazem (DILACOR XR) 240 MG 24 hr capsule Take 240 mg by mouth daily.   . diphenhydrAMINE (BENADRYL) 25 MG tablet Take 25 mg by mouth 2 (two) times daily as needed for allergies.  Marland Kitchen docusate sodium (COLACE) 100 MG capsule Take 1 capsule (100 mg total) by mouth 2 (two) times daily as needed for mild constipation.  . ferrous sulfate 325 (65 FE) MG tablet Take 1 tablet (325 mg total) by mouth 2 (two) times daily with a meal.  . furosemide (LASIX) 20 MG tablet Take 1 tablet (20 mg total) by mouth daily as needed for edema.  . hydrochlorothiazide  (HYDRODIURIL) 25 MG tablet Take 1 tablet (25 mg total) by mouth daily.  Marland Kitchen HYDROcodone-acetaminophen (NORCO) 7.5-325 MG tablet Take 1-2 tablets by mouth every 4 (four) hours as needed for severe pain (1 tab for 5-7 and 2 tabs for 8-10/10).  Marland Kitchen lisinopril (PRINIVIL,ZESTRIL) 40 MG tablet Take 1 tablet (40 mg total) by mouth daily.  . metoprolol succinate (TOPROL-XL) 50 MG 24 hr tablet Take 50 mg  by mouth every evening.   . Multiple Vitamin (MULTI-VITAMINS) TABS Take 1 tablet by mouth daily.  . NON FORMULARY Diet Type: NAS  . polyethylene glycol (MIRALAX / GLYCOLAX) packet Take 17 g by mouth daily as needed for mild constipation.  . ranitidine (ZANTAC) 150 MG tablet Take 150 mg by mouth daily.  Marland Kitchen spironolactone (ALDACTONE) 25 MG tablet Take 25 mg by mouth every evening.   . sucralfate (CARAFATE) 1 G tablet Take 1 g by mouth 2 (two) times daily.   . traZODone (DESYREL) 50 MG tablet Take 0.5-1 tablets (25-50 mg total) by mouth at bedtime as needed for sleep.  Marland Kitchen venlafaxine XR (EFFEXOR XR) 75 MG 24 hr capsule Take 1 capsule (75 mg total) by mouth daily with breakfast.  . warfarin (COUMADIN) 1 MG tablet Take 1 mg by mouth daily. Give with the 6 mg tablet to equal 7 mg  . warfarin (COUMADIN) 6 MG tablet Take 6 mg by mouth at bedtime.    No facility-administered encounter medications on file as of 06/18/2018.      SIGNIFICANT DIAGNOSTIC EXAMS   LABS REVIEWED PREVIOUS:   06-10-18: wbc 13.5; hgb 8.8; hct 26.8; mcv 99.6; plt 185; glucose 195; bun 33; creat 1.08; k+ 5.0; na++ 130; ca 8.0  06-11-18: wbc 11.0; hgb 8.2; hct 24.7; mcv 98.4 ;plt 189; glucose 154; bun 41; creat 1.02; k+ 4.4; na++ 130; ca 7.9  06-14-18; INR 1.27   TODAY:   06-17-18: INR 1.40    Review of Systems  Constitutional: Negative for malaise/fatigue.  Respiratory: Negative for cough and shortness of breath.   Cardiovascular: Negative for chest pain, palpitations and leg swelling.  Gastrointestinal: Negative for abdominal pain,  constipation and heartburn.  Musculoskeletal: Negative for back pain, joint pain and myalgias.  Skin: Negative.   Neurological: Negative for dizziness.  Psychiatric/Behavioral: The patient is not nervous/anxious.     Physical Exam Constitutional:      General: She is not in acute distress.    Appearance: Normal appearance. She is well-developed. She is not diaphoretic.  Neck:     Musculoskeletal: Neck supple.     Thyroid: No thyromegaly.  Cardiovascular:     Rate and Rhythm: Normal rate and regular rhythm.     Pulses: Normal pulses.     Heart sounds: Normal heart sounds.  Pulmonary:     Effort: Pulmonary effort is normal. No respiratory distress.     Breath sounds: Normal breath sounds.  Abdominal:     General: Bowel sounds are normal. There is no distension.     Palpations: Abdomen is soft.     Tenderness: There is no abdominal tenderness.  Musculoskeletal:     Right lower leg: No edema.     Left lower leg: No edema.     Comments: Is able to move all extremities  Is status post left hip fracture.     Lymphadenopathy:     Cervical: No cervical adenopathy.  Skin:    General: Skin is warm and dry.     Comments: Hip incision line without signs of infection present.     Neurological:     Mental Status: She is alert and oriented to person, place, and time.  Psychiatric:        Mood and Affect: Mood normal.      ASSESSMENT/ PLAN:  TODAY:   1. Atrial fibrillation 2. Current use of long term anticoagulation 3. Left hip fracture  Will change coumadin to 7 mg daily and will  check INR on 06-21-18 Will continue therapy as directed Her goal is to return back home.      MD is aware of resident's narcotic use and is in agreement with current plan of care. We will attempt to wean resident as apropriate   Ok Edwards NP [facility]  Contact [phone] Monday through Friday 8am- 5pm  After hours call [phone]

## 2018-06-19 ENCOUNTER — Encounter: Payer: Self-pay | Admitting: Adult Health

## 2018-06-19 ENCOUNTER — Other Ambulatory Visit: Payer: Self-pay | Admitting: Adult Health

## 2018-06-19 ENCOUNTER — Non-Acute Institutional Stay (SKILLED_NURSING_FACILITY): Payer: Medicare Other | Admitting: Adult Health

## 2018-06-19 DIAGNOSIS — E1159 Type 2 diabetes mellitus with other circulatory complications: Secondary | ICD-10-CM

## 2018-06-19 DIAGNOSIS — E119 Type 2 diabetes mellitus without complications: Secondary | ICD-10-CM

## 2018-06-19 DIAGNOSIS — I5032 Chronic diastolic (congestive) heart failure: Secondary | ICD-10-CM

## 2018-06-19 DIAGNOSIS — I10 Essential (primary) hypertension: Secondary | ICD-10-CM

## 2018-06-19 DIAGNOSIS — S72002S Fracture of unspecified part of neck of left femur, sequela: Secondary | ICD-10-CM

## 2018-06-19 MED ORDER — LISINOPRIL 20 MG PO TABS: 20.0000 mg | ORAL_TABLET | Freq: Every day | ORAL | 0 refills | Status: DC

## 2018-06-19 MED ORDER — FUROSEMIDE 20 MG PO TABS: 20.0000 mg | ORAL_TABLET | Freq: Every day | ORAL | 0 refills | Status: DC | PRN

## 2018-06-19 MED ORDER — SUCRALFATE 1 G PO TABS: 1.0000 g | ORAL_TABLET | Freq: Two times a day (BID) | ORAL | 0 refills | Status: DC

## 2018-06-19 MED ORDER — VENLAFAXINE HCL ER 75 MG PO CP24: 75.0000 mg | ORAL_CAPSULE | Freq: Every day | ORAL | 0 refills | Status: DC

## 2018-06-19 MED ORDER — HYDROCODONE-ACETAMINOPHEN 7.5-325 MG PO TABS: 1.0000 | ORAL_TABLET | ORAL | 0 refills | Status: DC | PRN

## 2018-06-19 MED ORDER — ATORVASTATIN CALCIUM 10 MG PO TABS: 10.0000 mg | ORAL_TABLET | Freq: Every day | ORAL | 0 refills | Status: DC

## 2018-06-19 MED ORDER — METOPROLOL SUCCINATE ER 50 MG PO TB24: 50.0000 mg | ORAL_TABLET | Freq: Every evening | ORAL | 0 refills | Status: DC

## 2018-06-19 MED ORDER — DILTIAZEM HCL ER 240 MG PO CP24: 240.0000 mg | ORAL_CAPSULE | Freq: Every day | ORAL | 0 refills | Status: DC

## 2018-06-19 MED ORDER — HYDROCHLOROTHIAZIDE 12.5 MG PO TABS: 12.5000 mg | ORAL_TABLET | Freq: Every day | ORAL | 0 refills | Status: DC

## 2018-06-19 MED ORDER — TRAZODONE HCL 50 MG PO TABS: 25.0000 mg | ORAL_TABLET | Freq: Every evening | ORAL | 0 refills | Status: DC | PRN

## 2018-06-19 MED ORDER — SPIRONOLACTONE 25 MG PO TABS: 25.0000 mg | ORAL_TABLET | Freq: Every evening | ORAL | 0 refills | Status: DC

## 2018-06-19 NOTE — Progress Notes (Signed)
[facility]   [facility] Room Number: [place] of Service:  SNF (31)    CODE STATUS: Full Code  Allergies  Allergen Reactions  . Penicillins     Yeast infections Has patient had a PCN reaction causing immediate rash, facial/tongue/throat swelling, SOB or lightheadedness with hypotension: No Has patient had a PCN reaction causing severe rash involving mucus membranes or skin necrosis: No Has patient had a PCN reaction that required hospitalization: No Has patient had a PCN reaction occurring within the last 10 years: No If all of the above answers are "NO", then may proceed with Cephalosporin use.   Marland Kitchen Penicillin V Potassium Other (See Comments)    Yeast infection  . Protonix [Pantoprazole Sodium] Rash  . Sulfa Antibiotics     Yeast infections  Other reaction(s): Other (See Comments) Yeast infection    Chief Complaint  Patient presents with  . Discharge Note    Discharging to home on 06/22/2018    HPI:  She is being discharged to home with home health for pt/ot/rn. She will not need any dme she has all necessary equipment. She will need to follow up with her medical provider and will need to have her prescriptions written. She had been hospitalized for a left hip. She was admitted to this facility short term rehab and is now ready for discharge to home.   Past Medical History:  Diagnosis Date  . Anemia   . Aortic valve regurgitation   . Arthritis   . Atrial fibrillation ([place])   . Cancer ([place]) 12/2016   Basal Cell Skin Cancer  . Cardiomyopathy ([place])   . CHF (congestive heart failure) ([place])   . CKD (chronic kidney disease)   . Colitis   . Diverticulosis   . Dyspnea   . Dysrhythmia   . GERD (gastroesophageal reflux disease)   . GI bleed   . Heart murmur   . History of hiatal hernia   . Hypercholesterolemia   . Hyperlipidemia   . Hypertension   . Insomnia   . Osteopenia   . Pre-diabetes   . Urinary, incontinence, stress female   .  Vertigo     Past Surgical History:  Procedure Laterality Date  . ANKLE SURGERY Right 1997   Fractured ankle  . COLONOSCOPY    . COLONOSCOPY WITH PROPOFOL N/A 02/08/2015   Procedure: COLONOSCOPY WITH PROPOFOL;  Surgeon: Manya Silvas, MD;  Location: [facility] ENDOSCOPY;  Service: Endoscopy;  Laterality: N/A;  . COLONOSCOPY WITH PROPOFOL N/A 08/20/2017   Procedure: COLONOSCOPY WITH PROPOFOL;  Surgeon: Manya Silvas, MD;  Location: [facility] ENDOSCOPY;  Service: Endoscopy;  Laterality: N/A;  . ESOPHAGOGASTRODUODENOSCOPY (EGD) WITH PROPOFOL N/A 08/20/2017   Procedure: ESOPHAGOGASTRODUODENOSCOPY (EGD) WITH PROPOFOL;  Surgeon: Manya Silvas, MD;  Location: [facility] ENDOSCOPY;  Service: Endoscopy;  Laterality: N/A;  . INTRAMEDULLARY (IM) NAIL INTERTROCHANTERIC Left 06/09/2018   Procedure: INTRAMEDULLARY (IM) NAIL INTERTROCHANTRIC;  Surgeon: Thornton Park, MD;  Location: [facility] ORS;  Service: Orthopedics;  Laterality: Left;  . KNEE ARTHROPLASTY Left 02/12/2017   Procedure: COMPUTER ASSISTED TOTAL KNEE ARTHROPLASTY;  Surgeon: Dereck Leep, MD;  Location: [facility] ORS;  Service: Orthopedics;  Laterality: Left;  . RADIAL HEAD ARTHROPLASTY Left 06/21/2017   Procedure: RADIAL HEAD ARTHROPLASTY;  Surgeon: Corky Mull, MD;  Location: [facility] ORS;  Service: Orthopedics;  Laterality: Left;  . SACROCOCCYGEAL ULCER REMOVAL    . TONSILLECTOMY    . TOTAL KNEE ARTHROPLASTY Left     Social  History   Socioeconomic History  . Marital status: Married    Spouse name: Not on file  . Number of children: Not on file  . Years of education: Not on file  . Highest education level: 12th grade  Occupational History  . Not on file  Social Needs  . Financial resource strain: Not hard at all  . Food insecurity:    Worry: Never true    Inability: Never true  . Transportation needs:    Medical: No    Non-medical: No  Tobacco Use  . Smoking status: Never Smoker  . Smokeless tobacco: Never Used  Substance and Sexual  Activity  . Alcohol use: No  . Drug use: No  . Sexual activity: Not on file  Lifestyle  . Physical activity:    Days per week: 0 days    Minutes per session: 0 min  . Stress: Not at all  Relationships  . Social connections:    Talks on phone: More than three times a week    Gets together: More than three times a week    Attends religious service: More than 4 times per year    Active member of club or organization: No    Attends meetings of clubs or organizations: Never    Relationship status: Married  . Intimate partner violence:    Fear of current or ex partner: No    Emotionally abused: No    Physically abused: No    Forced sexual activity: No  Other Topics Concern  . Not on file  Social History Narrative   Independent at baseline.  Lives at home with family   Family History  Problem Relation Age of Onset  . Heart disease Mother   . Diabetes Mother   . Heart disease Father   . Parkinson's disease Father   . Diabetes Brother   . Cancer Brother        Colon    VITAL SIGNS Pulse 78   Temp 98.8 F (37.1 C)   Resp 19   Ht 5\' 3"  (1.6 m)   Wt 169 lb 3.2 oz (76.7 kg)   SpO2 98%   BMI 29.97 kg/m   Patient's Medications  New Prescriptions   No medications on file  Previous Medications   ACETAMINOPHEN (TYLENOL) 650 MG CR TABLET    Take 1,300 mg by mouth at bedtime.   ATORVASTATIN (LIPITOR) 10 MG TABLET    Take 10 mg by mouth at bedtime.   DILTIAZEM (DILACOR XR) 240 MG 24 HR CAPSULE    Take 240 mg by mouth daily.    DIPHENHYDRAMINE (BENADRYL) 25 MG TABLET    Take 25 mg by mouth 2 (two) times daily as needed for allergies.   DOCUSATE SODIUM (COLACE) 100 MG CAPSULE    Take 1 capsule (100 mg total) by mouth 2 (two) times daily as needed for mild constipation.   FERROUS SULFATE 325 (65 FE) MG TABLET    Take 1 tablet (325 mg total) by mouth 2 (two) times daily with a meal.   FUROSEMIDE (LASIX) 20 MG TABLET    Take 1 tablet (20 mg total) by mouth daily as needed for edema.     HYDROCHLOROTHIAZIDE (HYDRODIURIL) 12.5 MG TABLET    Take 12.5 mg by mouth daily.   HYDROCODONE-ACETAMINOPHEN (NORCO) 7.5-325 MG TABLET    Take 1-2 tablets by mouth every 4 (four) hours as needed for severe pain (1 tab for 5-7 and 2 tabs for 8-10/10).  LISINOPRIL (PRINIVIL,ZESTRIL) 40 MG TABLET    Take 1 tablet (40 mg total) by mouth daily.   METOPROLOL SUCCINATE (TOPROL-XL) 50 MG 24 HR TABLET    Take 50 mg by mouth every evening.    MULTIPLE VITAMIN (MULTI-VITAMINS) TABS    Take 1 tablet by mouth daily.   NON FORMULARY    Diet Type: NAS   POLYETHYLENE GLYCOL (MIRALAX / GLYCOLAX) PACKET    Take 17 g by mouth daily as needed for mild constipation.   RANITIDINE (ZANTAC) 150 MG TABLET    Take 150 mg by mouth daily.   SPIRONOLACTONE (ALDACTONE) 25 MG TABLET    Take 25 mg by mouth every evening.    SUCRALFATE (CARAFATE) 1 G TABLET    Take 1 g by mouth 2 (two) times daily.    TRAZODONE (DESYREL) 50 MG TABLET    Take 0.5-1 tablets (25-50 mg total) by mouth at bedtime as needed for sleep.   VENLAFAXINE XR (EFFEXOR XR) 75 MG 24 HR CAPSULE    Take 1 capsule (75 mg total) by mouth daily with breakfast.   WARFARIN (COUMADIN) 1 MG TABLET    Take 1 mg by mouth daily. Give with the 6 mg tablet to equal 7 mg   WARFARIN (COUMADIN) 6 MG TABLET    Take 6 mg by mouth at bedtime.   Modified Medications   No medications on file  Discontinued Medications   HYDROCHLOROTHIAZIDE (HYDRODIURIL) 25 MG TABLET    Take 1 tablet (25 mg total) by mouth daily.     SIGNIFICANT DIAGNOSTIC EXAMS   LABS REVIEWED PREVIOUS:   06-10-18: wbc 13.5; hgb 8.8; hct 26.8; mcv 99.6; plt 185; glucose 195; bun 33; creat 1.08; k+ 5.0; na++ 130; ca 8.0  06-11-18: wbc 11.0; hgb 8.2; hct 24.7; mcv 98.4 ;plt 189; glucose 154; bun 41; creat 1.02; k+ 4.4; na++ 130; ca 7.9  06-14-18; INR 1.27  06-17-18: INR 1.40   NO NEW LABS.    Review of Systems  Constitutional: Negative for malaise/fatigue.  Respiratory: Negative for cough and shortness  of breath.   Cardiovascular: Negative for chest pain, palpitations and leg swelling.  Gastrointestinal: Negative for abdominal pain, constipation and heartburn.  Musculoskeletal: Negative for back pain, joint pain and myalgias.  Skin: Negative.   Neurological: Negative for dizziness.  Psychiatric/Behavioral: The patient is not nervous/anxious.     Physical Exam Constitutional:      General: She is not in acute distress.    Appearance: Normal appearance. She is well-developed. She is not diaphoretic.  Neck:     Musculoskeletal: Neck supple.     Thyroid: No thyromegaly.  Cardiovascular:     Rate and Rhythm: Normal rate and regular rhythm.     Pulses: Normal pulses.     Heart sounds: Normal heart sounds.  Pulmonary:     Effort: Pulmonary effort is normal. No respiratory distress.     Breath sounds: Normal breath sounds.  Abdominal:     General: Bowel sounds are normal. There is no distension.     Palpations: Abdomen is soft.     Tenderness: There is no abdominal tenderness.  Musculoskeletal:     Right lower leg: No edema.     Left lower leg: No edema.     Comments: Is able to move all extremities Is status post left hip fracture.   Lymphadenopathy:     Cervical: No cervical adenopathy.  Skin:    General: Skin is warm and dry.  Comments: Incision line without signs of infection present.   Neurological:     Mental Status: She is alert and oriented to person, place, and time.  Psychiatric:        Mood and Affect: Mood normal.      ASSESSMENT/ PLAN:  Patient is being discharged with the following home health services:  Pt/ot/rn: to evaluate and treat as indicated for gait balance strength adl training and medication management.   Patient is being discharged with the following durable medical equipment:  None needed  Patient has been advised to f/u with their PCP in 1-2 weeks to bring them up to date on their rehab stay.  Social services at facility was responsible for  arranging this appointment.  Pt was provided with a 30 day supply of prescriptions for medications and refills must be obtained from their PCP.  For controlled substances, a more limited supply may be provided adequate until PCP appointment only.   A 30 day supply of medications have been sent to [facility] in [place] with #20 vicodin 7.5/325 mg tabs.   Time spent with patient: 35 minutes: discussed medications; home health needs and dme. Verbalized understanding. Her lisinopril has been lowered to 20 mg daily due to her soft blood pressure readings   Ok Edwards NP [facility]  Contact [phone] Monday through Friday 8am- 5pm  After hours call [phone]

## 2018-06-21 ENCOUNTER — Other Ambulatory Visit
Admission: RE | Admit: 2018-06-21 | Discharge: 2018-06-21 | Disposition: A | Discharge status: Home / Self Care | Payer: Medicare Other | Source: Ambulatory Visit | Attending: Adult Health | Admitting: Adult Health

## 2018-06-21 ENCOUNTER — Other Ambulatory Visit: Payer: Self-pay | Admitting: Adult Health

## 2018-06-21 DIAGNOSIS — I4891 Unspecified atrial fibrillation: Secondary | ICD-10-CM | POA: Insufficient documentation

## 2018-06-21 DIAGNOSIS — M25552 Pain in left hip: Secondary | ICD-10-CM | POA: Diagnosis not present

## 2018-06-21 LAB — PROTIME-INR
INR: 2.18
Prothrombin Time: 24 seconds — ABNORMAL HIGH (ref 11.4–15.2)

## 2018-06-21 MED ORDER — WARFARIN SODIUM 1 MG PO TABS: 1.0000 mg | ORAL_TABLET | Freq: Every day | ORAL | 0 refills | Status: DC

## 2018-06-21 MED ORDER — WARFARIN SODIUM 6 MG PO TABS: 6.0000 mg | ORAL_TABLET | Freq: Every day | ORAL | 0 refills | Status: DC

## 2018-06-23 DIAGNOSIS — M8580 Other specified disorders of bone density and structure, unspecified site: Secondary | ICD-10-CM | POA: Diagnosis not present

## 2018-06-23 DIAGNOSIS — E1159 Type 2 diabetes mellitus with other circulatory complications: Secondary | ICD-10-CM | POA: Diagnosis not present

## 2018-06-23 DIAGNOSIS — E785 Hyperlipidemia, unspecified: Secondary | ICD-10-CM | POA: Diagnosis not present

## 2018-06-23 DIAGNOSIS — S72142D Displaced intertrochanteric fracture of left femur, subsequent encounter for closed fracture with routine healing: Secondary | ICD-10-CM | POA: Diagnosis not present

## 2018-06-23 DIAGNOSIS — Z7901 Long term (current) use of anticoagulants: Secondary | ICD-10-CM | POA: Diagnosis not present

## 2018-06-23 DIAGNOSIS — D631 Anemia in chronic kidney disease: Secondary | ICD-10-CM | POA: Diagnosis not present

## 2018-06-23 DIAGNOSIS — D509 Iron deficiency anemia, unspecified: Secondary | ICD-10-CM | POA: Diagnosis not present

## 2018-06-23 DIAGNOSIS — I351 Nonrheumatic aortic (valve) insufficiency: Secondary | ICD-10-CM | POA: Diagnosis not present

## 2018-06-23 DIAGNOSIS — I429 Cardiomyopathy, unspecified: Secondary | ICD-10-CM | POA: Diagnosis not present

## 2018-06-23 DIAGNOSIS — K5790 Diverticulosis of intestine, part unspecified, without perforation or abscess without bleeding: Secondary | ICD-10-CM | POA: Diagnosis not present

## 2018-06-23 DIAGNOSIS — Z85828 Personal history of other malignant neoplasm of skin: Secondary | ICD-10-CM | POA: Diagnosis not present

## 2018-06-23 DIAGNOSIS — G4700 Insomnia, unspecified: Secondary | ICD-10-CM | POA: Diagnosis not present

## 2018-06-23 DIAGNOSIS — N183 Chronic kidney disease, stage 3 (moderate): Secondary | ICD-10-CM | POA: Diagnosis not present

## 2018-06-23 DIAGNOSIS — E1122 Type 2 diabetes mellitus with diabetic chronic kidney disease: Secondary | ICD-10-CM | POA: Diagnosis not present

## 2018-06-23 DIAGNOSIS — I152 Hypertension secondary to endocrine disorders: Secondary | ICD-10-CM | POA: Diagnosis not present

## 2018-06-23 DIAGNOSIS — M810 Age-related osteoporosis without current pathological fracture: Secondary | ICD-10-CM | POA: Diagnosis not present

## 2018-06-23 DIAGNOSIS — I4821 Permanent atrial fibrillation: Secondary | ICD-10-CM | POA: Diagnosis not present

## 2018-06-23 DIAGNOSIS — I5032 Chronic diastolic (congestive) heart failure: Secondary | ICD-10-CM | POA: Diagnosis not present

## 2018-06-23 DIAGNOSIS — Z96652 Presence of left artificial knee joint: Secondary | ICD-10-CM | POA: Diagnosis not present

## 2018-06-23 DIAGNOSIS — K219 Gastro-esophageal reflux disease without esophagitis: Secondary | ICD-10-CM | POA: Diagnosis not present

## 2018-06-23 DIAGNOSIS — Z9181 History of falling: Secondary | ICD-10-CM | POA: Diagnosis not present

## 2018-06-25 ENCOUNTER — Other Ambulatory Visit: Payer: Self-pay

## 2018-06-25 ENCOUNTER — Emergency Department: Payer: Medicare Other

## 2018-06-25 ENCOUNTER — Ambulatory Visit: Payer: Self-pay

## 2018-06-25 ENCOUNTER — Observation Stay
Admission: EM | Admit: 2018-06-25 | Discharge: 2018-06-26 | Disposition: A | Discharge status: Home / Self Care | Payer: Medicare Other | Attending: Internal Medicine | Admitting: Internal Medicine

## 2018-06-25 DIAGNOSIS — E1122 Type 2 diabetes mellitus with diabetic chronic kidney disease: Secondary | ICD-10-CM | POA: Diagnosis not present

## 2018-06-25 DIAGNOSIS — I429 Cardiomyopathy, unspecified: Secondary | ICD-10-CM | POA: Diagnosis not present

## 2018-06-25 DIAGNOSIS — K5790 Diverticulosis of intestine, part unspecified, without perforation or abscess without bleeding: Secondary | ICD-10-CM | POA: Diagnosis not present

## 2018-06-25 DIAGNOSIS — N189 Chronic kidney disease, unspecified: Secondary | ICD-10-CM | POA: Diagnosis not present

## 2018-06-25 DIAGNOSIS — Z9181 History of falling: Secondary | ICD-10-CM | POA: Diagnosis not present

## 2018-06-25 DIAGNOSIS — Z882 Allergy status to sulfonamides status: Secondary | ICD-10-CM | POA: Diagnosis not present

## 2018-06-25 DIAGNOSIS — Z880 Allergy status to penicillin: Secondary | ICD-10-CM | POA: Diagnosis not present

## 2018-06-25 DIAGNOSIS — I5032 Chronic diastolic (congestive) heart failure: Secondary | ICD-10-CM | POA: Diagnosis not present

## 2018-06-25 DIAGNOSIS — E1159 Type 2 diabetes mellitus with other circulatory complications: Secondary | ICD-10-CM | POA: Diagnosis not present

## 2018-06-25 DIAGNOSIS — I4891 Unspecified atrial fibrillation: Secondary | ICD-10-CM | POA: Diagnosis not present

## 2018-06-25 DIAGNOSIS — Z79899 Other long term (current) drug therapy: Secondary | ICD-10-CM | POA: Diagnosis not present

## 2018-06-25 DIAGNOSIS — I959 Hypotension, unspecified: Secondary | ICD-10-CM | POA: Diagnosis not present

## 2018-06-25 DIAGNOSIS — M8580 Other specified disorders of bone density and structure, unspecified site: Secondary | ICD-10-CM | POA: Insufficient documentation

## 2018-06-25 DIAGNOSIS — Z8249 Family history of ischemic heart disease and other diseases of the circulatory system: Secondary | ICD-10-CM | POA: Diagnosis not present

## 2018-06-25 DIAGNOSIS — K219 Gastro-esophageal reflux disease without esophagitis: Secondary | ICD-10-CM | POA: Diagnosis not present

## 2018-06-25 DIAGNOSIS — I509 Heart failure, unspecified: Secondary | ICD-10-CM | POA: Insufficient documentation

## 2018-06-25 DIAGNOSIS — Z85828 Personal history of other malignant neoplasm of skin: Secondary | ICD-10-CM | POA: Diagnosis not present

## 2018-06-25 DIAGNOSIS — I10 Essential (primary) hypertension: Secondary | ICD-10-CM | POA: Diagnosis present

## 2018-06-25 DIAGNOSIS — E875 Hyperkalemia: Secondary | ICD-10-CM | POA: Diagnosis not present

## 2018-06-25 DIAGNOSIS — Z833 Family history of diabetes mellitus: Secondary | ICD-10-CM | POA: Diagnosis not present

## 2018-06-25 DIAGNOSIS — E785 Hyperlipidemia, unspecified: Secondary | ICD-10-CM | POA: Diagnosis not present

## 2018-06-25 DIAGNOSIS — Z96642 Presence of left artificial hip joint: Secondary | ICD-10-CM | POA: Diagnosis not present

## 2018-06-25 DIAGNOSIS — N179 Acute kidney failure, unspecified: Principal | ICD-10-CM | POA: Diagnosis present

## 2018-06-25 DIAGNOSIS — R9431 Abnormal electrocardiogram [ECG] [EKG]: Secondary | ICD-10-CM | POA: Insufficient documentation

## 2018-06-25 DIAGNOSIS — D631 Anemia in chronic kidney disease: Secondary | ICD-10-CM | POA: Diagnosis not present

## 2018-06-25 DIAGNOSIS — D509 Iron deficiency anemia, unspecified: Secondary | ICD-10-CM | POA: Diagnosis not present

## 2018-06-25 DIAGNOSIS — Z7901 Long term (current) use of anticoagulants: Secondary | ICD-10-CM | POA: Insufficient documentation

## 2018-06-25 DIAGNOSIS — R600 Localized edema: Secondary | ICD-10-CM | POA: Diagnosis not present

## 2018-06-25 DIAGNOSIS — Z96652 Presence of left artificial knee joint: Secondary | ICD-10-CM | POA: Insufficient documentation

## 2018-06-25 DIAGNOSIS — I130 Hypertensive heart and chronic kidney disease with heart failure and stage 1 through stage 4 chronic kidney disease, or unspecified chronic kidney disease: Secondary | ICD-10-CM | POA: Diagnosis not present

## 2018-06-25 DIAGNOSIS — I4821 Permanent atrial fibrillation: Secondary | ICD-10-CM | POA: Diagnosis not present

## 2018-06-25 DIAGNOSIS — E861 Hypovolemia: Secondary | ICD-10-CM

## 2018-06-25 DIAGNOSIS — G4700 Insomnia, unspecified: Secondary | ICD-10-CM | POA: Diagnosis not present

## 2018-06-25 DIAGNOSIS — E1169 Type 2 diabetes mellitus with other specified complication: Secondary | ICD-10-CM

## 2018-06-25 DIAGNOSIS — E669 Obesity, unspecified: Secondary | ICD-10-CM

## 2018-06-25 DIAGNOSIS — I152 Hypertension secondary to endocrine disorders: Secondary | ICD-10-CM | POA: Diagnosis not present

## 2018-06-25 DIAGNOSIS — M810 Age-related osteoporosis without current pathological fracture: Secondary | ICD-10-CM | POA: Diagnosis not present

## 2018-06-25 DIAGNOSIS — I9589 Other hypotension: Secondary | ICD-10-CM | POA: Diagnosis present

## 2018-06-25 DIAGNOSIS — N183 Chronic kidney disease, stage 3 (moderate): Secondary | ICD-10-CM | POA: Diagnosis not present

## 2018-06-25 DIAGNOSIS — S72142D Displaced intertrochanteric fracture of left femur, subsequent encounter for closed fracture with routine healing: Secondary | ICD-10-CM | POA: Diagnosis not present

## 2018-06-25 DIAGNOSIS — I351 Nonrheumatic aortic (valve) insufficiency: Secondary | ICD-10-CM | POA: Diagnosis not present

## 2018-06-25 LAB — URINALYSIS, COMPLETE (UACMP) WITH MICROSCOPIC
Bilirubin Urine: NEGATIVE
Glucose, UA: 50 mg/dL — AB
KETONES UR: NEGATIVE mg/dL
Nitrite: POSITIVE — AB
Protein, ur: NEGATIVE mg/dL
SQUAMOUS EPITHELIAL / LPF: NONE SEEN (ref 0–5)
Specific Gravity, Urine: 1.009 (ref 1.005–1.030)
WBC, UA: >50 WBC/hpf — ABNORMAL HIGH (ref 0–5)
pH: 5 (ref 5.0–8.0)

## 2018-06-25 LAB — BASIC METABOLIC PANEL
Anion gap: 8 (ref 5–15)
Anion gap: 5 (ref 5–15)
BUN: 56 mg/dL — AB (ref 8–23)
BUN: 51 mg/dL — ABNORMAL HIGH (ref 8–23)
CO2: 21 mmol/L — AB (ref 22–32)
CO2: 20 mmol/L — ABNORMAL LOW (ref 22–32)
CREATININE: 1.53 mg/dL — AB (ref 0.44–1.00)
Calcium: 9.5 mg/dL (ref 8.9–10.3)
Calcium: 8.6 mg/dL — ABNORMAL LOW (ref 8.9–10.3)
Chloride: 101 mmol/L (ref 98–111)
Chloride: 107 mmol/L (ref 98–111)
Creatinine, Ser: 1.46 mg/dL — ABNORMAL HIGH (ref 0.44–1.00)
GFR calc Af Amer: 41 mL/min — ABNORMAL LOW (ref >60)
GFR calc non Af Amer: 33 mL/min — ABNORMAL LOW (ref >60)
GFR calc non Af Amer: 35 mL/min — ABNORMAL LOW (ref >60)
GFR, EST AFRICAN AMERICAN: 39 mL/min — AB (ref >60)
Glucose, Bld: 143 mg/dL — ABNORMAL HIGH (ref 70–99)
Glucose, Bld: 114 mg/dL — ABNORMAL HIGH (ref 70–99)
Potassium: 5.6 mmol/L — ABNORMAL HIGH (ref 3.5–5.1)
Potassium: 6.2 mmol/L — ABNORMAL HIGH (ref 3.5–5.1)
Sodium: 130 mmol/L — ABNORMAL LOW (ref 135–145)
Sodium: 132 mmol/L — ABNORMAL LOW (ref 135–145)

## 2018-06-25 LAB — CBC
HCT: 36.4 % (ref 36.0–46.0)
Hemoglobin: 11.4 g/dL — ABNORMAL LOW (ref 12.0–15.0)
MCH: 33.5 pg (ref 26.0–34.0)
MCHC: 31.3 g/dL (ref 30.0–36.0)
MCV: 107.1 fL — AB (ref 80.0–100.0)
NRBC: 0 % (ref 0.0–0.2)
Platelets: 393 10*3/uL (ref 150–400)
RBC: 3.4 MIL/uL — ABNORMAL LOW (ref 3.87–5.11)
RDW: 15.9 % — ABNORMAL HIGH (ref 11.5–15.5)
WBC: 10.6 10*3/uL — AB (ref 4.0–10.5)

## 2018-06-25 LAB — PROTIME-INR
INR: 2.58
Prothrombin Time: 27.3 seconds — ABNORMAL HIGH (ref 11.4–15.2)

## 2018-06-25 MED ORDER — WARFARIN - PHARMACIST DOSING INPATIENT
Freq: Every day | Status: DC
  Filled 2018-06-25: qty 1

## 2018-06-25 MED ORDER — DEXTROSE 50 % IV SOLN
1.0000 | Freq: Once | INTRAVENOUS | Status: AC
  Administered 2018-06-25: 50 mL via INTRAVENOUS
  Filled 2018-06-25: qty 50

## 2018-06-25 MED ORDER — WARFARIN SODIUM 6 MG PO TABS
6.0000 mg | ORAL_TABLET | Freq: Every day | ORAL | Status: DC
  Filled 2018-06-25: qty 1

## 2018-06-25 MED ORDER — SODIUM CHLORIDE 0.9% FLUSH: 3.0000 mL | Freq: Once | INTRAVENOUS | Status: DC

## 2018-06-25 MED ORDER — SODIUM CHLORIDE 0.9 % IV SOLN
Freq: Once | INTRAVENOUS | Status: AC
  Administered 2018-06-25: 22:00:00 via INTRAVENOUS

## 2018-06-25 MED ORDER — CALCIUM GLUCONATE 10 % IV SOLN
1.0000 g | Freq: Once | INTRAVENOUS | Status: AC
  Administered 2018-06-25: 1 g via INTRAVENOUS
  Filled 2018-06-25: qty 10

## 2018-06-25 MED ORDER — PATIROMER SORBITEX CALCIUM 8.4 G PO PACK
8.4000 g | PACK | Freq: Every day | ORAL | Status: DC
  Administered 2018-06-25: 8.4 g via ORAL
  Filled 2018-06-25 (×3): qty 1

## 2018-06-25 MED ORDER — INSULIN ASPART 100 UNIT/ML ~~LOC~~ SOLN
4.0000 [IU] | Freq: Once | SUBCUTANEOUS | Status: AC
  Administered 2018-06-25: 4 [IU] via INTRAVENOUS
  Filled 2018-06-25: qty 1

## 2018-06-25 MED ORDER — SODIUM CHLORIDE 0.9 % IV BOLUS
1000.0000 mL | Freq: Once | INTRAVENOUS | Status: AC
  Administered 2018-06-25: 1000 mL via INTRAVENOUS

## 2018-06-25 NOTE — Telephone Encounter (Signed)
Pt OP connie from [facility] called to report that the patients BP is very low. First check 70/40. The patient was placed in reclining position and drank some water and BP recovered to 78/40. [id number]. Per OP pt denies other  Symptoms.  Patient states that while an inpatient at [facility] the doctors decreased her lisinopril from 40mg  to 20mg s. Per protocol pt will go to ED for evaluation of symptoms. Care advice read to [place]. Marlowe Kays states that pt has caregiver that will get her to the ED. Reason for Disposition . [2] Systolic BP < 80 AND [1] NOT dizzy, lightheaded or weak  Answer Assessment - Initial Assessment Questions 1. BLOOD PRESSURE: "What is the blood pressure?" "Did you take at least two measurements 5 minutes apart?"     70/40 78/40 2. ONSET: "When did you take your blood pressure?"     Today 2 in the afternoon 3. HOW: "How did you obtain the blood pressure?" (e.g., visiting nurse, automatic home BP monitor)     Visiting nurse 4. HISTORY: "Do you have a history of low blood pressure?" "What is your blood pressure normally?"     Yes  Sunday 06/23/2018 95/55 5. MEDICATIONS: "Are you taking any medications for blood pressure?" If yes: "Have they been changed recently?"     Lisinopril for 40 to 20mg 6. PULSE RATE: "Do you know what your pulse rate is?"      68  7. OTHER SYMPTOMS: "Have you been sick recently?" "Have you had a recent injury?"    no 8. PREGNANCY: "Is there any chance you are pregnant?" "When was your last menstrual period?"    N/A  Protocols used: LOW BLOOD PRESSURE-A-AH

## 2018-06-25 NOTE — ED Notes (Signed)
Patient transported to Ultrasound 

## 2018-06-25 NOTE — Telephone Encounter (Signed)
Patient currently at ER

## 2018-06-25 NOTE — ED Provider Notes (Signed)
[facility] Emergency Department Provider Note    First MD Initiated Contact with Patient 06/25/18 1749     (approximate)  I have reviewed the triage vital signs and the nursing notes.   HISTORY  Chief Complaint Hypotension    HPI Natalie Gardner is a 73 y.o. female below listed past medical history presents the ER for low blood pressure.  She status post left hip replacement with discharge from rehab facility now back at home.  She presented after physical therapy checked her blood pressure after doing their session and found that she was low to 70/50.  She was otherwise asymptomatic and remains asymptomatic at this time.  Denies any chest pain or shortness of breath.  Denies any leg pain but has noticed worsening left leg swelling.  She is on Coumadin for history of A. fib.  Denies any melena or hematochezia.  No nausea or vomiting but has had decreased appetite since the surgery.    Past Medical History:  Diagnosis Date  . Anemia   . Aortic valve regurgitation   . Arthritis   . Atrial fibrillation ([place])   . Cancer ([place]) 12/2016   Basal Cell Skin Cancer  . Cardiomyopathy ([place])   . CHF (congestive heart failure) ([place])   . CKD (chronic kidney disease)   . Colitis   . Diverticulosis   . Dyspnea   . Dysrhythmia   . GERD (gastroesophageal reflux disease)   . GI bleed   . Heart murmur   . History of hiatal hernia   . Hypercholesterolemia   . Hyperlipidemia   . Hypertension   . Insomnia   . Osteopenia   . Pre-diabetes   . Urinary, incontinence, stress female   . Vertigo    Family History  Problem Relation Age of Onset  . Heart disease Mother   . Diabetes Mother   . Heart disease Father   . Parkinson's disease Father   . Diabetes Brother   . Cancer Brother        Colon   Past Surgical History:  Procedure Laterality Date  . ANKLE SURGERY Right 1997   Fractured ankle  . COLONOSCOPY    . COLONOSCOPY WITH PROPOFOL N/A 02/08/2015   Procedure: COLONOSCOPY WITH PROPOFOL;  Surgeon: Manya Silvas, MD;  Location: [facility] ENDOSCOPY;  Service: Endoscopy;  Laterality: N/A;  . COLONOSCOPY WITH PROPOFOL N/A 08/20/2017   Procedure: COLONOSCOPY WITH PROPOFOL;  Surgeon: Manya Silvas, MD;  Location: [facility] ENDOSCOPY;  Service: Endoscopy;  Laterality: N/A;  . ESOPHAGOGASTRODUODENOSCOPY (EGD) WITH PROPOFOL N/A 08/20/2017   Procedure: ESOPHAGOGASTRODUODENOSCOPY (EGD) WITH PROPOFOL;  Surgeon: Manya Silvas, MD;  Location: [facility] ENDOSCOPY;  Service: Endoscopy;  Laterality: N/A;  . INTRAMEDULLARY (IM) NAIL INTERTROCHANTERIC Left 06/09/2018   Procedure: INTRAMEDULLARY (IM) NAIL INTERTROCHANTRIC;  Surgeon: Thornton Park, MD;  Location: [facility] ORS;  Service: Orthopedics;  Laterality: Left;  . KNEE ARTHROPLASTY Left 02/12/2017   Procedure: COMPUTER ASSISTED TOTAL KNEE ARTHROPLASTY;  Surgeon: Dereck Leep, MD;  Location: [facility] ORS;  Service: Orthopedics;  Laterality: Left;  . RADIAL HEAD ARTHROPLASTY Left 06/21/2017   Procedure: RADIAL HEAD ARTHROPLASTY;  Surgeon: Corky Mull, MD;  Location: [facility] ORS;  Service: Orthopedics;  Laterality: Left;  . SACROCOCCYGEAL ULCER REMOVAL    . TONSILLECTOMY    . TOTAL KNEE ARTHROPLASTY Left    Patient Active Problem List   Diagnosis Date Noted  . Hypertension associated with diabetes ([place]) 06/14/2018  . Dyslipidemia associated with type 2 diabetes  mellitus ([place]) 06/14/2018  . GERD without esophagitis 06/14/2018  . Acute on chronic anemia 06/14/2018  . Current use of long term anticoagulation 06/14/2018  . Closed left hip fracture ([place]) 06/08/2018  . Depression, major, single episode, moderate ([place]) 11/14/2017  . IDA (iron deficiency anemia) 07/31/2017  . Closed displaced fracture of head of left radius 06/20/2017  . Insomnia 04/10/2017  . Status post total left knee replacement 02/12/2017  . 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 Advanced care planning/counseling discussion 09/28/2016  . Essential  hypertension 09/13/2015  . CHF (congestive heart failure) ([place]) 09/13/2015  . Atrial fibrillation ([place]) 09/13/2015  . Hyperlipidemia 09/13/2015  . Type 2 diabetes mellitus without complication, with no history of insulin use ([place]) 09/13/2015      Prior to Admission medications   Medication Sig Start Date End Date Taking? Authorizing Provider  acetaminophen (TYLENOL) 650 MG CR tablet Take 1,300 mg by mouth at bedtime.   Yes [provider]  atorvastatin (LIPITOR) 10 MG tablet Take 1 tablet (10 mg total) by mouth at bedtime. 06/19/18  Yes Gerlene Fee, NP  diltiazem (DILACOR XR) 240 MG 24 hr capsule Take 1 capsule (240 mg total) by mouth daily. 06/19/18  Yes Gerlene Fee, NP  diphenhydrAMINE (BENADRYL) 25 MG tablet Take 25 mg by mouth 2 (two) times daily as needed for allergies.   Yes [provider]  docusate sodium (COLACE) 100 MG capsule Take 1 capsule (100 mg total) by mouth 2 (two) times daily as needed for mild constipation. 06/11/18  Yes Henreitta Leber, MD  ferrous sulfate 325 (65 FE) MG tablet Take 1 tablet (325 mg total) by mouth 2 (two) times daily with a meal. 11/14/17  Yes Crissman, Jeannette How, MD  furosemide (LASIX) 20 MG tablet Take 1 tablet (20 mg total) by mouth daily as needed for edema. 06/19/18  Yes Gerlene Fee, NP  hydrochlorothiazide (HYDRODIURIL) 12.5 MG tablet Take 1 tablet (12.5 mg total) by mouth daily. 06/19/18  Yes Gerlene Fee, NP  HYDROcodone-acetaminophen (NORCO) 7.5-325 MG tablet Take 1-2 tablets by mouth every 4 (four) hours as needed for severe pain (1 tab for 5-7 and 2 tabs for 8-10/10). 06/19/18  Yes Gerlene Fee, NP  lisinopril (PRINIVIL,ZESTRIL) 20 MG tablet Take 1 tablet (20 mg total) by mouth daily. 06/19/18  Yes Gerlene Fee, NP  metoprolol succinate (TOPROL-XL) 50 MG 24 hr tablet Take 1 tablet (50 mg total) by mouth every evening. 06/19/18  Yes Gerlene Fee, NP  Multiple Vitamin (MULTI-VITAMINS) TABS Take 1 tablet by  mouth daily.   Yes [provider]  polyethylene glycol (MIRALAX / GLYCOLAX) packet Take 17 g by mouth daily as needed for mild constipation. 06/11/18  Yes Henreitta Leber, MD  ranitidine (ZANTAC) 150 MG tablet Take 150 mg by mouth daily.   Yes [provider]  spironolactone (ALDACTONE) 25 MG tablet Take 1 tablet (25 mg total) by mouth every evening. 06/19/18  Yes Gerlene Fee, NP  sucralfate (CARAFATE) 1 g tablet Take 1 tablet (1 g total) by mouth 2 (two) times daily. 06/19/18  Yes Gerlene Fee, NP  traZODone (DESYREL) 50 MG tablet Take 0.5-1 tablets (25-50 mg total) by mouth at bedtime as needed for sleep. 06/19/18  Yes Gerlene Fee, NP  venlafaxine XR (EFFEXOR XR) 75 MG 24 hr capsule Take 1 capsule (75 mg total) by mouth daily with breakfast. 06/19/18  Yes Gerlene Fee, NP  warfarin (COUMADIN) 1  MG tablet Take 1 tablet (1 mg total) by mouth daily. Give with the 6 mg tablet to equal 7 mg 06/21/18  Yes Gerlene Fee, NP  warfarin (COUMADIN) 6 MG tablet Take 1 tablet (6 mg total) by mouth at bedtime. 06/21/18  Yes Gerlene Fee, NP    Allergies Penicillins; Penicillin v potassium; Protonix [pantoprazole sodium]; and Sulfa antibiotics    Social History Social History   Tobacco Use  . Smoking status: Never Smoker  . Smokeless tobacco: Never Used  Substance Use Topics  . Alcohol use: No  . Drug use: No    Review of Systems Patient denies headaches, rhinorrhea, blurry vision, numbness, shortness of breath, chest pain, edema, cough, abdominal pain, nausea, vomiting, diarrhea, dysuria, fevers, rashes or hallucinations unless otherwise stated above in HPI. ____________________________________________   PHYSICAL EXAM:  VITAL SIGNS: Vitals:   06/25/18 2045 06/25/18 2130  BP: 111/67 (!) 99/46  Pulse: 75 77  Resp: 18 19  Temp:    SpO2: 100% 98%    Constitutional: Alert and oriented.  Eyes: Conjunctivae are normal.  Head: Atraumatic. Nose: No  congestion/rhinnorhea. Mouth/Throat: Mucous membranes are moist.   Neck: No stridor. Painless ROM.  Cardiovascular: Normal rate, regular rhythm. Grossly normal heart sounds.  Good peripheral circulation. Respiratory: Normal respiratory effort.  No retractions. Lungs CTAB. Gastrointestinal: Soft and nontender. No distention. No abdominal bruits. No CVA tenderness. Genitourinary:  Musculoskeletal: Left leg with some lateral tenderness palpation as well as distal edema but neurovascular intact.  Compartment is soft.  No joint effusions. Neurologic:  Normal speech and language. No gross focal neurologic deficits are appreciated. No facial droop Skin:  Skin is warm, dry and intact. No rash noted. Psychiatric: Mood and affect are normal. Speech and behavior are normal.  ____________________________________________   LABS (all labs ordered are listed, but only abnormal results are displayed)  Results for orders placed or performed during the hospital encounter of 06/25/18 (from the past 24 hour(s))  Basic metabolic panel     Status: Abnormal   Collection Time: 06/25/18  3:52 PM  Result Value Ref Range   Sodium 130 (L) 135 - 145 mmol/L   Potassium 5.6 (H) 3.5 - 5.1 mmol/L   Chloride 101 98 - 111 mmol/L   CO2 21 (L) 22 - 32 mmol/L   Glucose, Bld 143 (H) 70 - 99 mg/dL   BUN 56 (H) 8 - 23 mg/dL   Creatinine, Ser 1.53 (H) 0.44 - 1.00 mg/dL   Calcium 9.5 8.9 - 10.3 mg/dL   GFR calc non Af Amer 33 (L) >60 mL/min   GFR calc Af Amer 39 (L) >60 mL/min   Anion gap 8 5 - 15  CBC     Status: Abnormal   Collection Time: 06/25/18  3:52 PM  Result Value Ref Range   WBC 10.6 (H) 4.0 - 10.5 K/uL   RBC 3.40 (L) 3.87 - 5.11 MIL/uL   Hemoglobin 11.4 (L) 12.0 - 15.0 g/dL   HCT 36.4 36.0 - 46.0 %   MCV 107.1 (H) 80.0 - 100.0 fL   MCH 33.5 26.0 - 34.0 pg   MCHC 31.3 30.0 - 36.0 g/dL   RDW 15.9 (H) 11.5 - 15.5 %   Platelets 393 150 - 400 K/uL   nRBC 0.0 0.0 - 0.2 %  Protime-INR     Status: Abnormal    Collection Time: 06/25/18  7:16 PM  Result Value Ref Range   Prothrombin Time 27.3 (H) 11.4 - 15.2 seconds  INR 1.51   Basic metabolic panel     Status: Abnormal   Collection Time: 06/25/18  8:21 PM  Result Value Ref Range   Sodium 132 (L) 135 - 145 mmol/L   Potassium 6.2 (H) 3.5 - 5.1 mmol/L   Chloride 107 98 - 111 mmol/L   CO2 20 (L) 22 - 32 mmol/L   Glucose, Bld 114 (H) 70 - 99 mg/dL   BUN 51 (H) 8 - 23 mg/dL   Creatinine, Ser 1.46 (H) 0.44 - 1.00 mg/dL   Calcium 8.6 (L) 8.9 - 10.3 mg/dL   GFR calc non Af Amer 35 (L) >60 mL/min   GFR calc Af Amer 41 (L) >60 mL/min   Anion gap 5 5 - 15   ____________________________________________  EKG My review and personal interpretation at Time: 15:49   Indication: hypotension  Rate: 70  Rhythm: sinus afib.  Axis: normal Other: nonspecific st abn ____________________________________________  RADIOLOGY  I personally reviewed all radiographic images ordered to evaluate for the above acute complaints and reviewed radiology reports and findings.  These findings were personally discussed with the patient.  Please see medical record for radiology report.  ____________________________________________   PROCEDURES  Procedure(s) performed:  .Critical Care Performed by: Merlyn Lot, MD Authorized by: Merlyn Lot, MD   Critical care provider statement:    Critical care time (minutes):  30   Critical care time was exclusive of:  Separately billable procedures and treating other patients   Critical care was necessary to treat or prevent imminent or life-threatening deterioration of the following conditions:  Dehydration and renal failure   Critical care was time spent personally by me on the following activities:  Development of treatment plan with patient or surrogate, discussions with consultants, evaluation of patient's response to treatment, examination of patient, obtaining history from patient or surrogate, ordering and  performing treatments and interventions, ordering and review of laboratory studies, ordering and review of radiographic studies, pulse oximetry, re-evaluation of patient's condition and review of old charts      Critical Care performed: yes ____________________________________________   INITIAL IMPRESSION / [place] / ED COURSE  Pertinent labs & imaging results that were available during my care of the patient were reviewed by me and considered in my medical decision making (see chart for details).   DDX: AKI, dehydration, acute blood loss anemia, medication reaction  Natalie Gardner is a 73 y.o. who presents to the ED with symptoms as described above.  She is otherwise well-appearing and in no acute distress.  Is afebrile but hypotensive.  Currently asymptomatic but very likely is having some dehydration status post surgery decreased in take as well as continued antihypertensive medication.  Her thigh is soft and nontender.  No abdominal pain.  Doubt bleed.  Clinical Course as of Jun 25 2202  Tue Jun 25, 2018  2004 Patient reassessed.  Blood pressure improving after IV hydration.  Will repeat basic metabolic panel after IV fluids   [PR]  2201 Patient with increasing potassium and no significant improvement in her AKI.  Still with soft blood pressures therefore do believe patient will benefit from continued IV hydration.  She is otherwise asymptomatic at this time.  We will give IV insulin as well as D50 calcium and [place].  Have discussed with the patient and available family all diagnostics and treatments performed thus far and all questions were answered to the best of my ability. The patient demonstrates understanding and agreement with plan.    [  PR]    Clinical Course User Index [PR] Merlyn Lot, MD     As part of my medical decision making, I reviewed the following data within the [facility] notes reviewed and incorporated, Labs  reviewed, notes from prior ED visits and [place] Controlled Substance Database   ____________________________________________   FINAL CLINICAL IMPRESSION(S) / ED DIAGNOSES  Final diagnoses:  Hypotension due to hypovolemia  Hyperkalemia  AKI (acute kidney injury) ([place])      NEW MEDICATIONS STARTED DURING THIS VISIT:  New Prescriptions   No medications on file     Note:  This document was prepared using Dragon voice recognition software and may include unintentional dictation errors.    Merlyn Lot, MD 06/25/18 2205

## 2018-06-25 NOTE — Progress Notes (Signed)
ANTICOAGULATION CONSULT NOTE - Initial Consult  Pharmacy Consult for Warfarin  Indication: atrial fibrillation  Allergies  Allergen Reactions  . Penicillins     Yeast infections Has patient had a PCN reaction causing immediate rash, facial/tongue/throat swelling, SOB or lightheadedness with hypotension: No Has patient had a PCN reaction causing severe rash involving mucus membranes or skin necrosis: No Has patient had a PCN reaction that required hospitalization: No Has patient had a PCN reaction occurring within the last 10 years: No If all of the above answers are "NO", then may proceed with Cephalosporin use.   Marland Kitchen Penicillin V Potassium Other (See Comments)    Yeast infection  . Protonix [Pantoprazole Sodium] Rash  . Sulfa Antibiotics     Yeast infections  Other reaction(s): Other (See Comments) Yeast infection    Patient Measurements: Height: 5\' 3"  (160 cm) Weight: 168 lb (76.2 kg) IBW/kg (Calculated) : 52.4 Heparin Dosing Weight:   Vital Signs: Temp: 98.4 F (36.9 C) (01/28 1544) Temp Source: Oral (01/28 1544) BP: 99/46 (01/28 2130) Pulse Rate: 77 (01/28 2130)  Labs: Recent Labs    06/25/18 1552 06/25/18 1916 06/25/18 2021  HGB 11.4*  --   --   HCT 36.4  --   --   PLT 393  --   --   LABPROT  --  27.3*  --   INR  --  2.58  --   CREATININE 1.53*  --  1.46*    Estimated Creatinine Clearance: 33.5 mL/min (A) (by C-G formula based on SCr of 1.46 mg/dL (H)).   Medical History: Past Medical History:  Diagnosis Date  . Anemia   . Aortic valve regurgitation   . Arthritis   . Atrial fibrillation ([place])   . Cancer ([place]) 12/2016   Basal Cell Skin Cancer  . Cardiomyopathy ([place])   . CHF (congestive heart failure) ([place])   . CKD (chronic kidney disease)   . Colitis   . Diverticulosis   . Dyspnea   . Dysrhythmia   . GERD (gastroesophageal reflux disease)   . GI bleed   . Heart murmur   . History of hiatal hernia   . Hypercholesterolemia   . Hyperlipidemia    . Hypertension   . Insomnia   . Osteopenia   . Pre-diabetes   . Urinary, incontinence, stress female   . Vertigo     Medications:  (Not in a hospital admission)   Assessment: Pharmacy consulted to dose warfarin in this 73 year old female with Afib. 1/28:  INR = 2.58 Pt on warfarin 6 mg PO QHS at home,  Last dose was on 1/28 @ 0900.  Goal of Therapy:  INR 2-3   Plan:  Will continue pt on warfarin 6 mg PO daily to resume on 1/29 and recheck INR on 1/29 with AM labs.   Breauna Mazzeo D 06/25/2018,10:44 PM

## 2018-06-25 NOTE — H&P (Signed)
[facility] NAME: Natalie Gardner    MR#:  [id number]  DATE OF BIRTH:  1945-07-26  DATE OF ADMISSION:  06/25/2018  PRIMARY CARE PHYSICIAN: Guadalupe Maple, MD   REQUESTING/REFERRING PHYSICIAN: Quentin Cornwall, MD  CHIEF COMPLAINT:   Chief Complaint  Patient presents with  . Hypotension    HISTORY OF PRESENT ILLNESS:  Natalie Gardner  is a 73 y.o. female who presents with chief complaint as above.  Patient presents to the ED after being found to have low blood pressure in outpatient clinic.  She is status post left hip replacement.  On evaluation here in the ED she was found to have AKI and hyperkalemia.  Her blood pressure was borderline low normal to low.  She is clinically dehydrated.  Hospitalist were called for admission  PAST MEDICAL HISTORY:   Past Medical History:  Diagnosis Date  . Anemia   . Aortic valve regurgitation   . Arthritis   . Atrial fibrillation ([place])   . Cancer ([place]) 12/2016   Basal Cell Skin Cancer  . Cardiomyopathy ([place])   . CHF (congestive heart failure) ([place])   . CKD (chronic kidney disease)   . Colitis   . Diverticulosis   . Dyspnea   . Dysrhythmia   . GERD (gastroesophageal reflux disease)   . GI bleed   . Heart murmur   . History of hiatal hernia   . Hypercholesterolemia   . Hyperlipidemia   . Hypertension   . Insomnia   . Osteopenia   . Pre-diabetes   . Urinary, incontinence, stress female   . Vertigo      PAST SURGICAL HISTORY:   Past Surgical History:  Procedure Laterality Date  . ANKLE SURGERY Right 1997   Fractured ankle  . COLONOSCOPY    . COLONOSCOPY WITH PROPOFOL N/A 02/08/2015   Procedure: COLONOSCOPY WITH PROPOFOL;  Surgeon: Manya Silvas, MD;  Location: [facility] ENDOSCOPY;  Service: Endoscopy;  Laterality: N/A;  . COLONOSCOPY WITH PROPOFOL N/A 08/20/2017   Procedure: COLONOSCOPY WITH PROPOFOL;  Surgeon: Manya Silvas, MD;  Location: [facility] ENDOSCOPY;  Service:  Endoscopy;  Laterality: N/A;  . ESOPHAGOGASTRODUODENOSCOPY (EGD) WITH PROPOFOL N/A 08/20/2017   Procedure: ESOPHAGOGASTRODUODENOSCOPY (EGD) WITH PROPOFOL;  Surgeon: Manya Silvas, MD;  Location: [facility] ENDOSCOPY;  Service: Endoscopy;  Laterality: N/A;  . INTRAMEDULLARY (IM) NAIL INTERTROCHANTERIC Left 06/09/2018   Procedure: INTRAMEDULLARY (IM) NAIL INTERTROCHANTRIC;  Surgeon: Thornton Park, MD;  Location: [facility] ORS;  Service: Orthopedics;  Laterality: Left;  . KNEE ARTHROPLASTY Left 02/12/2017   Procedure: COMPUTER ASSISTED TOTAL KNEE ARTHROPLASTY;  Surgeon: Dereck Leep, MD;  Location: [facility] ORS;  Service: Orthopedics;  Laterality: Left;  . RADIAL HEAD ARTHROPLASTY Left 06/21/2017   Procedure: RADIAL HEAD ARTHROPLASTY;  Surgeon: Corky Mull, MD;  Location: [facility] ORS;  Service: Orthopedics;  Laterality: Left;  . SACROCOCCYGEAL ULCER REMOVAL    . TONSILLECTOMY    . TOTAL KNEE ARTHROPLASTY Left      SOCIAL HISTORY:   Social History   Tobacco Use  . Smoking status: Never Smoker  . Smokeless tobacco: Never Used  Substance Use Topics  . Alcohol use: No     FAMILY HISTORY:   Family History  Problem Relation Age of Onset  . Heart disease Mother   . Diabetes Mother   . Heart disease Father   . Parkinson's disease Father   . Diabetes Brother   . Cancer Brother  Colon     DRUG ALLERGIES:   Allergies  Allergen Reactions  . Penicillins     Yeast infections Has patient had a PCN reaction causing immediate rash, facial/tongue/throat swelling, SOB or lightheadedness with hypotension: No Has patient had a PCN reaction causing severe rash involving mucus membranes or skin necrosis: No Has patient had a PCN reaction that required hospitalization: No Has patient had a PCN reaction occurring within the last 10 years: No If all of the above answers are "NO", then may proceed with Cephalosporin use.   Marland Kitchen Penicillin V Potassium Other (See Comments)    Yeast infection  .  Protonix [Pantoprazole Sodium] Rash  . Sulfa Antibiotics     Yeast infections  Other reaction(s): Other (See Comments) Yeast infection    MEDICATIONS AT HOME:   Prior to Admission medications   Medication Sig Start Date End Date Taking? Authorizing Provider  acetaminophen (TYLENOL) 650 MG CR tablet Take 1,300 mg by mouth at bedtime.   Yes [provider]  atorvastatin (LIPITOR) 10 MG tablet Take 1 tablet (10 mg total) by mouth at bedtime. 06/19/18  Yes Gerlene Fee, NP  diltiazem (DILACOR XR) 240 MG 24 hr capsule Take 1 capsule (240 mg total) by mouth daily. 06/19/18  Yes Gerlene Fee, NP  diphenhydrAMINE (BENADRYL) 25 MG tablet Take 25 mg by mouth 2 (two) times daily as needed for allergies.   Yes [provider]  docusate sodium (COLACE) 100 MG capsule Take 1 capsule (100 mg total) by mouth 2 (two) times daily as needed for mild constipation. 06/11/18  Yes Henreitta Leber, MD  ferrous sulfate 325 (65 FE) MG tablet Take 1 tablet (325 mg total) by mouth 2 (two) times daily with a meal. 11/14/17  Yes Crissman, Jeannette How, MD  furosemide (LASIX) 20 MG tablet Take 1 tablet (20 mg total) by mouth daily as needed for edema. 06/19/18  Yes Gerlene Fee, NP  hydrochlorothiazide (HYDRODIURIL) 12.5 MG tablet Take 1 tablet (12.5 mg total) by mouth daily. 06/19/18  Yes Gerlene Fee, NP  HYDROcodone-acetaminophen (NORCO) 7.5-325 MG tablet Take 1-2 tablets by mouth every 4 (four) hours as needed for severe pain (1 tab for 5-7 and 2 tabs for 8-10/10). 06/19/18  Yes Gerlene Fee, NP  lisinopril (PRINIVIL,ZESTRIL) 20 MG tablet Take 1 tablet (20 mg total) by mouth daily. 06/19/18  Yes Gerlene Fee, NP  metoprolol succinate (TOPROL-XL) 50 MG 24 hr tablet Take 1 tablet (50 mg total) by mouth every evening. 06/19/18  Yes Gerlene Fee, NP  Multiple Vitamin (MULTI-VITAMINS) TABS Take 1 tablet by mouth daily.   Yes [provider]  polyethylene glycol (MIRALAX / GLYCOLAX)  packet Take 17 g by mouth daily as needed for mild constipation. 06/11/18  Yes Henreitta Leber, MD  ranitidine (ZANTAC) 150 MG tablet Take 150 mg by mouth daily.   Yes [provider]  spironolactone (ALDACTONE) 25 MG tablet Take 1 tablet (25 mg total) by mouth every evening. 06/19/18  Yes Gerlene Fee, NP  sucralfate (CARAFATE) 1 g tablet Take 1 tablet (1 g total) by mouth 2 (two) times daily. 06/19/18  Yes Gerlene Fee, NP  traZODone (DESYREL) 50 MG tablet Take 0.5-1 tablets (25-50 mg total) by mouth at bedtime as needed for sleep. 06/19/18  Yes Gerlene Fee, NP  venlafaxine XR (EFFEXOR XR) 75 MG 24 hr capsule Take 1 capsule (75 mg total) by mouth daily with breakfast. 06/19/18  Yes  Gerlene Fee, NP  warfarin (COUMADIN) 1 MG tablet Take 1 tablet (1 mg total) by mouth daily. Give with the 6 mg tablet to equal 7 mg 06/21/18  Yes Gerlene Fee, NP  warfarin (COUMADIN) 6 MG tablet Take 1 tablet (6 mg total) by mouth at bedtime. 06/21/18  Yes Gerlene Fee, NP    REVIEW OF SYSTEMS:  Review of Systems  Constitutional: Negative for chills, fever, malaise/fatigue and weight loss.  HENT: Negative for ear pain, hearing loss and tinnitus.   Eyes: Negative for blurred vision, double vision, pain and redness.  Respiratory: Negative for cough, hemoptysis and shortness of breath.   Cardiovascular: Negative for chest pain, palpitations, orthopnea and leg swelling.  Gastrointestinal: Negative for abdominal pain, constipation, diarrhea, nausea and vomiting.  Genitourinary: Negative for dysuria, frequency and hematuria.  Musculoskeletal: Negative for back pain, joint pain and neck pain.  Skin:       No acne, rash, or lesions  Neurological: Negative for dizziness, tremors, focal weakness and weakness.  Endo/Heme/Allergies: Negative for polydipsia. Does not bruise/bleed easily.  Psychiatric/Behavioral: Negative for depression. The patient is not nervous/anxious and does not have  insomnia.      VITAL SIGNS:   Vitals:   06/25/18 2000 06/25/18 2030 06/25/18 2045 06/25/18 2130  BP: (!) 107/56 (!) 109/56 111/67 (!) 99/46  Pulse: 85 86 75 77  Resp: 11 15 18 19   Temp:      TempSrc:      SpO2: 100% 100% 100% 98%  Weight:      Height:       Wt Readings from Last 3 Encounters:  06/25/18 76.2 kg  06/19/18 76.7 kg  06/18/18 77.5 kg    PHYSICAL EXAMINATION:  Physical Exam  Vitals reviewed. Constitutional: She is oriented to person, place, and time. She appears well-developed and well-nourished. No distress.  HENT:  Head: Normocephalic and atraumatic.  Dry mucous membranes  Eyes: Pupils are equal, round, and reactive to light. Conjunctivae and EOM are normal. No scleral icterus.  Neck: Normal range of motion. Neck supple. No JVD present. No thyromegaly present.  Cardiovascular: Normal rate, regular rhythm and intact distal pulses. Exam reveals no gallop and no friction rub.  No murmur heard. Respiratory: Effort normal and breath sounds normal. No respiratory distress. She has no wheezes. She has no rales.  GI: Soft. Bowel sounds are normal. She exhibits no distension. There is no abdominal tenderness.  Musculoskeletal: Normal range of motion.        General: No edema.     Comments: No arthritis, no gout  Lymphadenopathy:    She has no cervical adenopathy.  Neurological: She is alert and oriented to person, place, and time. No cranial nerve deficit.  No dysarthria, no aphasia  Skin: Skin is warm and dry. No rash noted. No erythema.  Psychiatric: She has a normal mood and affect. Her behavior is normal. Judgment and thought content normal.    LABORATORY PANEL:   CBC Recent Labs  Lab 06/25/18 1552  WBC 10.6*  HGB 11.4*  HCT 36.4  PLT 393   ------------------------------------------------------------------------------------------------------------------  Chemistries  Recent Labs  Lab 06/25/18 2021  NA 132*  K 6.2*  CL 107  CO2 20*  GLUCOSE  114*  BUN 51*  CREATININE 1.46*  CALCIUM 8.6*   ------------------------------------------------------------------------------------------------------------------  Cardiac Enzymes No results for input(s): TROPONINI in the last 168 hours. ------------------------------------------------------------------------------------------------------------------  RADIOLOGY:  US Venous Img Lower Unilateral Left  Result Date: 06/25/2018 CLINICAL DATA:  LEFT leg pain and swelling question postoperative DVT, LEFT lower extremity edema for 2 weeks, history of atrial fibrillation, cardiomyopathy, CHF, hypertension EXAM: LEFT LOWER EXTREMITY VENOUS DOPPLER ULTRASOUND TECHNIQUE: Gray-scale sonography with graded compression, as well as color Doppler and duplex ultrasound were performed to evaluate the lower extremity deep venous systems from the level of the common femoral vein and including the common femoral, femoral, profunda femoral, popliteal and calf veins including the posterior tibial, peroneal and gastrocnemius veins when visible. The superficial great saphenous vein was also interrogated. Spectral Doppler was utilized to evaluate flow at rest and with distal augmentation maneuvers in the common femoral, femoral and popliteal veins. COMPARISON:  None. FINDINGS: Contralateral Common Femoral Vein: Respiratory phasicity is normal and symmetric with the symptomatic side. No evidence of thrombus. Normal compressibility. Common Femoral Vein: No evidence of thrombus. Normal compressibility, respiratory phasicity and response to augmentation. Saphenofemoral Junction: No evidence of thrombus. Normal compressibility and flow on color Doppler imaging. Profunda Femoral Vein: No evidence of thrombus. Normal compressibility and flow on color Doppler imaging. Femoral Vein: No evidence of thrombus. Normal compressibility, respiratory phasicity and response to augmentation. Popliteal Vein: No evidence of thrombus. Normal  compressibility, respiratory phasicity and response to augmentation. Calf Veins: Inadequately visualized due to soft tissue swelling. Superficial Great Saphenous Vein: No evidence of thrombus. Normal compressibility. Venous Reflux:  None. Other Findings:  None. IMPRESSION: No evidence of deep venous thrombosis in the LEFT lower extremity. LEFT calf veins not adequately visualized for assessment. Electronically Signed   By: Lavonia Dana M.D.   On: 06/25/2018 19:09    EKG:   Orders placed or performed during the hospital encounter of 06/25/18  . ED EKG  . ED EKG  . EKG 12-Lead  . EKG 12-Lead    IMPRESSION AND PLAN:  Principal Problem:   AKI (acute kidney injury) ([place]) -due to patient's dehydration.  She has not been drinking as much, but she has continued taking all of her diuretics.  We will hold diuretics for now, as well as other antihypertensives, see below.  Hydrate with fluids tonight and monitor for improvement in creatinine.  Avoid nephrotoxins. Active Problems:   Hyperkalemia -she was given calcium, insulin and D50, and [place] in the ED, trend potassium until within normal limits   Essential hypertension -hold antihypertensives for now as above   Atrial fibrillation (HCC) -continue home meds including anticoagulation   Type 2 diabetes mellitus without complication, with no history of insulin use (HCC) -sliding scale insulin coverage   Hyperlipidemia -Home dose antilipid   GERD without esophagitis -home dose H2 blocker  Chart review performed and case discussed with ED provider. Labs, imaging and/or ECG reviewed by provider and discussed with patient/family. Management plans discussed with the patient and/or family.  DVT PROPHYLAXIS: Systemic anticoagulation  GI PROPHYLAXIS:  H2 blocker  ADMISSION STATUS: Observation  CODE STATUS: Full Code Status History    Date Active Date Inactive Code Status Order ID Comments User Context   06/09/2018 0111 06/09/2018 1351 Full Code  [id number]  Thornton Park, MD Inpatient   06/08/2018 1534 06/09/2018 0111 Full Code [id number]  Gladstone Lighter, MD Inpatient   02/12/2017 1049 02/14/2017 1413 Full Code [id number]  Dereck Leep, MD Inpatient    Advance Directive Documentation     Most Recent Value  Type of Advance Directive  Healthcare Power of Attorney, Living will  Pre-existing out of facility DNR order (yellow form or pink MOST form)  -  "MOST" Form in Place?  -  TOTAL TIME TAKING CARE OF THIS PATIENT: 40 minutes.   Ethlyn Daniels 06/25/2018, 10:36 PM  [facility]  Office  [phone]  CC: Primary care physician; Guadalupe Maple, MD  Note:  This document was prepared using Dragon voice recognition software and may include unintentional dictation errors.

## 2018-06-25 NOTE — Telephone Encounter (Signed)
Needs to go to ER if she is symptomatic

## 2018-06-25 NOTE — ED Triage Notes (Signed)
Pt states she was having occupational therapy today at home and was told she was hypotensive 70/50's states they took it 3 times, states an hours prior to the that they checked before PT and it was normal. Pt denies any sx with it, denies dizziness, weakness or anything else. Pt is having therapy from previous hx fx.

## 2018-06-26 ENCOUNTER — Other Ambulatory Visit: Payer: Self-pay

## 2018-06-26 ENCOUNTER — Telehealth: Payer: Self-pay | Admitting: Family Medicine

## 2018-06-26 DIAGNOSIS — I4891 Unspecified atrial fibrillation: Secondary | ICD-10-CM | POA: Diagnosis not present

## 2018-06-26 DIAGNOSIS — E875 Hyperkalemia: Secondary | ICD-10-CM | POA: Diagnosis not present

## 2018-06-26 DIAGNOSIS — N179 Acute kidney failure, unspecified: Secondary | ICD-10-CM | POA: Diagnosis not present

## 2018-06-26 DIAGNOSIS — I10 Essential (primary) hypertension: Secondary | ICD-10-CM | POA: Diagnosis not present

## 2018-06-26 LAB — CBC
HCT: 32.6 % — ABNORMAL LOW (ref 36.0–46.0)
Hemoglobin: 10.3 g/dL — ABNORMAL LOW (ref 12.0–15.0)
MCH: 33.6 pg (ref 26.0–34.0)
MCHC: 31.6 g/dL (ref 30.0–36.0)
MCV: 106.2 fL — ABNORMAL HIGH (ref 80.0–100.0)
Platelets: 286 10*3/uL (ref 150–400)
RBC: 3.07 MIL/uL — ABNORMAL LOW (ref 3.87–5.11)
RDW: 15.9 % — AB (ref 11.5–15.5)
WBC: 7.3 10*3/uL (ref 4.0–10.5)
nRBC: 0 % (ref 0.0–0.2)

## 2018-06-26 LAB — BASIC METABOLIC PANEL
Anion gap: 7 (ref 5–15)
BUN: 42 mg/dL — AB (ref 8–23)
CO2: 19 mmol/L — ABNORMAL LOW (ref 22–32)
Calcium: 8.9 mg/dL (ref 8.9–10.3)
Chloride: 107 mmol/L (ref 98–111)
Creatinine, Ser: 1.14 mg/dL — ABNORMAL HIGH (ref 0.44–1.00)
GFR calc Af Amer: 55 mL/min — ABNORMAL LOW (ref >60)
GFR calc non Af Amer: 48 mL/min — ABNORMAL LOW (ref >60)
Glucose, Bld: 105 mg/dL — ABNORMAL HIGH (ref 70–99)
Potassium: 5.2 mmol/L — ABNORMAL HIGH (ref 3.5–5.1)
Sodium: 133 mmol/L — ABNORMAL LOW (ref 135–145)

## 2018-06-26 LAB — PROTIME-INR
INR: 2.55
Prothrombin Time: 27.1 seconds — ABNORMAL HIGH (ref 11.4–15.2)

## 2018-06-26 LAB — POTASSIUM: Potassium: 5.6 mmol/L — ABNORMAL HIGH (ref 3.5–5.1)

## 2018-06-26 MED ORDER — SODIUM CHLORIDE 0.9 % IV SOLN
INTRAVENOUS | Status: AC
  Administered 2018-06-26: 01:00:00 via INTRAVENOUS

## 2018-06-26 MED ORDER — ONDANSETRON HCL 4 MG/2ML IJ SOLN: 4.0000 mg | Freq: Four times a day (QID) | INTRAMUSCULAR | Status: DC | PRN

## 2018-06-26 MED ORDER — POLYETHYLENE GLYCOL 3350 17 G PO PACK: 17.0000 g | PACK | Freq: Every day | ORAL | Status: DC | PRN

## 2018-06-26 MED ORDER — INFLUENZA VAC SPLIT HIGH-DOSE 0.5 ML IM SUSY: 0.5000 mL | PREFILLED_SYRINGE | INTRAMUSCULAR | Status: DC

## 2018-06-26 MED ORDER — ACETAMINOPHEN 650 MG RE SUPP: 650.0000 mg | Freq: Four times a day (QID) | RECTAL | Status: DC | PRN

## 2018-06-26 MED ORDER — VENLAFAXINE HCL ER 75 MG PO CP24
75.0000 mg | ORAL_CAPSULE | Freq: Every day | ORAL | Status: DC
  Administered 2018-06-26: 75 mg via ORAL
  Filled 2018-06-26 (×2): qty 1

## 2018-06-26 MED ORDER — DOCUSATE SODIUM 100 MG PO CAPS: 100.0000 mg | ORAL_CAPSULE | Freq: Two times a day (BID) | ORAL | Status: DC | PRN

## 2018-06-26 MED ORDER — ONDANSETRON HCL 4 MG PO TABS: 4.0000 mg | ORAL_TABLET | Freq: Four times a day (QID) | ORAL | Status: DC | PRN

## 2018-06-26 MED ORDER — FAMOTIDINE 20 MG PO TABS
20.0000 mg | ORAL_TABLET | Freq: Every day | ORAL | Status: DC
  Administered 2018-06-26: 20 mg via ORAL
  Filled 2018-06-26: qty 1

## 2018-06-26 MED ORDER — METOPROLOL SUCCINATE ER 50 MG PO TB24: 50.0000 mg | ORAL_TABLET | Freq: Every evening | ORAL | Status: DC

## 2018-06-26 MED ORDER — HYDROCODONE-ACETAMINOPHEN 7.5-325 MG PO TABS: 1.0000 | ORAL_TABLET | ORAL | Status: DC | PRN

## 2018-06-26 MED ORDER — ACETAMINOPHEN 325 MG PO TABS: 650.0000 mg | ORAL_TABLET | Freq: Four times a day (QID) | ORAL | Status: DC | PRN

## 2018-06-26 MED ORDER — ATORVASTATIN CALCIUM 10 MG PO TABS: 10.0000 mg | ORAL_TABLET | Freq: Every day | ORAL | Status: DC

## 2018-06-26 MED ORDER — DILTIAZEM HCL ER 240 MG PO CP24: 240.0000 mg | ORAL_CAPSULE | Freq: Every day | ORAL | Status: DC

## 2018-06-26 MED ORDER — TRAZODONE HCL 50 MG PO TABS: 25.0000 mg | ORAL_TABLET | Freq: Every evening | ORAL | Status: DC | PRN

## 2018-06-26 NOTE — Discharge Instructions (Signed)
Fall precaution  INR today 06/26/18 was 2.55

## 2018-06-26 NOTE — Telephone Encounter (Signed)
ok 

## 2018-06-26 NOTE — Care Management Note (Signed)
Case Management Note  Patient Details  Name: Natalie Gardner MRN: [id number] Date of Birth: 11-05-1945  Subjective/Objective:                  Met with Patient caregiver and daughter in the room to discuss DC needs and plans Patient has a caregiver at the home and has [place] set up already with [facility], Notified [facility] that the patient will like to continue their services, [facility]ene [facility] accepted Patient has a RW and 3 in 1 at home already Patient has a PCP in place and has transportation with the daughter and the caregiver Patient has no issues affording medication Encouraged patient to continue drinking fluids as prescribed and continue using the IS for PNA prevention [place] list provided to the patient for choice per CMS.gov  Action/Plan: [place] list provided per CMS.gov Notified [facility] of choice, Helene Kelp has accepted to resume care at home  Expected Discharge Date:  06/26/18               Expected Discharge Plan:     In-House Referral:     Discharge planning Services  CM Consult  Post Acute Care Choice:    Choice offered to:     DME Arranged:    DME Agency:     HH Arranged:    [place] Agency:     Status of Service:  Completed, signed off  If discussed at [employer] of Stay Meetings, dates discussed:    Additional Comments:  Su Hilt, RN 06/26/2018, 10:04 AM

## 2018-06-26 NOTE — Telephone Encounter (Signed)
Copied from [place] [phone]. Topic: Quick Communication - See Telephone Encounter >> Jun 26, 2018  9:13 AM Antonieta Iba C wrote: CRM for notification. See Telephone encounter for: 06/26/18.  Marlowe Kays - OT w/ [facility], requesting VO to work with pt.   Frequency: 2 times a week for 3 weeks.   CB: [id number]

## 2018-06-26 NOTE — Telephone Encounter (Signed)
Routing to provider  

## 2018-06-26 NOTE — Progress Notes (Signed)
ANTICOAGULATION CONSULT NOTE - Initial Consult  Pharmacy Consult for Warfarin  Indication: atrial fibrillation  Allergies  Allergen Reactions  . Penicillins     Yeast infections Has patient had a PCN reaction causing immediate rash, facial/tongue/throat swelling, SOB or lightheadedness with hypotension: No Has patient had a PCN reaction causing severe rash involving mucus membranes or skin necrosis: No Has patient had a PCN reaction that required hospitalization: No Has patient had a PCN reaction occurring within the last 10 years: No If all of the above answers are "NO", then may proceed with Cephalosporin use.   Marland Kitchen Penicillin V Potassium Other (See Comments)    Yeast infection  . Protonix [Pantoprazole Sodium] Rash  . Sulfa Antibiotics     Yeast infections  Other reaction(s): Other (See Comments) Yeast infection    Patient Measurements: Height: 5\' 3"  (160 cm) Weight: 168 lb (76.2 kg) IBW/kg (Calculated) : 52.4 Heparin Dosing Weight:   Vital Signs: Temp: 98.6 F (37 C) (01/29 0023) Temp Source: Oral (01/29 0023) BP: 113/55 (01/29 0023) Pulse Rate: 106 (01/29 0023)  Labs: Recent Labs    06/25/18 1552 06/25/18 1916 06/25/18 2021 06/26/18 0405  HGB 11.4*  --   --  10.3*  HCT 36.4  --   --  32.6*  PLT 393  --   --  286  LABPROT  --  27.3*  --  27.1*  INR  --  2.58  --  2.55  CREATININE 1.53*  --  1.46* 1.14*    Estimated Creatinine Clearance: 42.9 mL/min (A) (by C-G formula based on SCr of 1.14 mg/dL (H)).   Medical History: Past Medical History:  Diagnosis Date  . Anemia   . Aortic valve regurgitation   . Arthritis   . Atrial fibrillation ([place])   . Cancer ([place]) 12/2016   Basal Cell Skin Cancer  . Cardiomyopathy ([place])   . CHF (congestive heart failure) ([place])   . CKD (chronic kidney disease)   . Colitis   . Diverticulosis   . Dyspnea   . Dysrhythmia   . GERD (gastroesophageal reflux disease)   . GI bleed   . Heart murmur   . History of hiatal  hernia   . Hypercholesterolemia   . Hyperlipidemia   . Hypertension   . Insomnia   . Osteopenia   . Pre-diabetes   . Urinary, incontinence, stress female   . Vertigo     Medications:  Medications Prior to Admission  Medication Sig Dispense Refill Last Dose  . acetaminophen (TYLENOL) 650 MG CR tablet Take 1,300 mg by mouth at bedtime.   06/24/2018 at 2100  . atorvastatin (LIPITOR) 10 MG tablet Take 1 tablet (10 mg total) by mouth at bedtime. 30 tablet 0 06/24/2018 at 2100  . diltiazem (DILACOR XR) 240 MG 24 hr capsule Take 1 capsule (240 mg total) by mouth daily. 30 capsule 0 06/25/2018 at 0900  . diphenhydrAMINE (BENADRYL) 25 MG tablet Take 25 mg by mouth 2 (two) times daily as needed for allergies.   prn at prn  . docusate sodium (COLACE) 100 MG capsule Take 1 capsule (100 mg total) by mouth 2 (two) times daily as needed for mild constipation. 10 capsule 0 prn at prn  . ferrous sulfate 325 (65 FE) MG tablet Take 1 tablet (325 mg total) by mouth 2 (two) times daily with a meal. 60 tablet 6 06/25/2018 at 0900  . furosemide (LASIX) 20 MG tablet Take 1 tablet (20 mg total) by mouth daily as  needed for edema. 30 tablet 0 prn at prn  . hydrochlorothiazide (HYDRODIURIL) 12.5 MG tablet Take 1 tablet (12.5 mg total) by mouth daily. 30 tablet 0 06/25/2018 at 0900  . HYDROcodone-acetaminophen (NORCO) 7.5-325 MG tablet Take 1-2 tablets by mouth every 4 (four) hours as needed for severe pain (1 tab for 5-7 and 2 tabs for 8-10/10). 20 tablet 0 prn at prn  . lisinopril (PRINIVIL,ZESTRIL) 20 MG tablet Take 1 tablet (20 mg total) by mouth daily. 30 tablet 0 06/25/2018 at 0900  . metoprolol succinate (TOPROL-XL) 50 MG 24 hr tablet Take 1 tablet (50 mg total) by mouth every evening. 30 tablet 0 06/25/2018 at 1200  . Multiple Vitamin (MULTI-VITAMINS) TABS Take 1 tablet by mouth daily.   06/25/2018 at 0800  . polyethylene glycol (MIRALAX / GLYCOLAX) packet Take 17 g by mouth daily as needed for mild constipation. 14  each 0 prn at prn  . ranitidine (ZANTAC) 150 MG tablet Take 150 mg by mouth daily.   06/25/2018 at 0800  . spironolactone (ALDACTONE) 25 MG tablet Take 1 tablet (25 mg total) by mouth every evening. 30 tablet 0 06/25/2018 at 1200  . sucralfate (CARAFATE) 1 g tablet Take 1 tablet (1 g total) by mouth 2 (two) times daily. 60 tablet 0 06/25/2018 at 0900  . traZODone (DESYREL) 50 MG tablet Take 0.5-1 tablets (25-50 mg total) by mouth at bedtime as needed for sleep. 30 tablet 0 prn at prn  . venlafaxine XR (EFFEXOR XR) 75 MG 24 hr capsule Take 1 capsule (75 mg total) by mouth daily with breakfast. 30 capsule 0 06/25/2018 at 0900  . warfarin (COUMADIN) 1 MG tablet Take 1 tablet (1 mg total) by mouth daily. Give with the 6 mg tablet to equal 7 mg 30 tablet 0 06/25/2018 at 0900  . warfarin (COUMADIN) 6 MG tablet Take 1 tablet (6 mg total) by mouth at bedtime. 30 tablet 0 06/25/2018 at 0900    Assessment: Pharmacy consulted to dose warfarin in this 73 year old female with Afib. Pt on warfarin 6 mg PO QHS at home,  Last dose was on 1/28 @ 0900.  1/28:  INR = 2.58 1/29:  INR = 2.55  Goal of Therapy:  INR 2-3   Plan:  Will continue pt on warfarin 6 mg PO daily and recheck INR with AM labs.  Forrest Moron, PharmD Clinical Pharmacist 06/26/2018,7:04 AM

## 2018-06-26 NOTE — Discharge Summary (Signed)
Natalie Gardner at [place] NAME: Natalie Gardner    MR#:  [id number]  DATE OF BIRTH:  12/07/1945  DATE OF ADMISSION:  06/25/2018   ADMITTING PHYSICIAN: Lance Coon, MD  DATE OF DISCHARGE: 06/26/2018 11:30 AM  PRIMARY CARE PHYSICIAN: Guadalupe Maple, MD   ADMISSION DIAGNOSIS:  Hyperkalemia [E87.5] AKI (acute kidney injury) ([place]) [N17.9] Hypotension due to hypovolemia [I95.89, E86.1] DISCHARGE DIAGNOSIS:  Principal Problem:   AKI (acute kidney injury) ([place]) Active Problems:   Essential hypertension   Atrial fibrillation ([place])   Hyperlipidemia   Type 2 diabetes mellitus without complication, with no history of insulin use ([place])   GERD without esophagitis  SECONDARY DIAGNOSIS:   Past Medical History:  Diagnosis Date  . Anemia   . Aortic valve regurgitation   . Arthritis   . Atrial fibrillation ([place])   . Cancer ([place]) 12/2016   Basal Cell Skin Cancer  . Cardiomyopathy ([place])   . CHF (congestive heart failure) ([place])   . CKD (chronic kidney disease)   . Colitis   . Diverticulosis   . Dyspnea   . Dysrhythmia   . GERD (gastroesophageal reflux disease)   . GI bleed   . Heart murmur   . History of hiatal hernia   . Hypercholesterolemia   . Hyperlipidemia   . Hypertension   . Insomnia   . Osteopenia   . Pre-diabetes   . Urinary, incontinence, stress female   . Vertigo    HOSPITAL COURSE:    AKI (acute kidney injury) ([place]) -due to patient's dehydration. Improving with IV fluid support.  Hold diuretics and lisinopril.   Hyperkalemia -she was given calcium, insulin and D50, and [place] in the ED. improved.  Discontinued lisinopril and spironolactone.    Essential hypertension -hold antihypertensives except Cardizem due to soft blood pressure..   Atrial fibrillation ([place]) -continue Cardizem and Coumadin.  Follow-up with Dr. Nehemiah Massed as outpatient.   Type 2 diabetes mellitus without complication, with no history of insulin use  (HCC) -sliding scale insulin coverage   Hyperlipidemia -Home dose antilipid   GERD without esophagitis -home dose H2 blocker DISCHARGE CONDITIONS:  Stable, discharge to home with home health PT. CONSULTS OBTAINED:   DRUG ALLERGIES:   Allergies  Allergen Reactions  . Penicillins     Yeast infections Has patient had a PCN reaction causing immediate rash, facial/tongue/throat swelling, SOB or lightheadedness with hypotension: No Has patient had a PCN reaction causing severe rash involving mucus membranes or skin necrosis: No Has patient had a PCN reaction that required hospitalization: No Has patient had a PCN reaction occurring within the last 10 years: No If all of the above answers are "NO", then may proceed with Cephalosporin use.   Marland Kitchen Penicillin V Potassium Other (See Comments)    Yeast infection  . Protonix [Pantoprazole Sodium] Rash  . Sulfa Antibiotics     Yeast infections  Other reaction(s): Other (See Comments) Yeast infection   DISCHARGE MEDICATIONS:   Allergies as of 06/26/2018      Reactions   Penicillins    Yeast infections Has patient had a PCN reaction causing immediate rash, facial/tongue/throat swelling, SOB or lightheadedness with hypotension: No Has patient had a PCN reaction causing severe rash involving mucus membranes or skin necrosis: No Has patient had a PCN reaction that required hospitalization: No Has patient had a PCN reaction occurring within the last 10 years: No If all of the above answers are "NO", then may proceed  with Cephalosporin use.   Penicillin V Potassium Other (See Comments)   Yeast infection   Protonix [pantoprazole Sodium] Rash   Sulfa Antibiotics    Yeast infections  Other reaction(s): Other (See Comments) Yeast infection      Medication List    STOP taking these medications   furosemide 20 MG tablet Commonly known as:  LASIX   hydrochlorothiazide 12.5 MG tablet Commonly known as:  HYDRODIURIL   lisinopril 20 MG  tablet Commonly known as:  PRINIVIL,ZESTRIL   metoprolol succinate 50 MG 24 hr tablet Commonly known as:  TOPROL-XL   spironolactone 25 MG tablet Commonly known as:  ALDACTONE     TAKE these medications   acetaminophen 650 MG CR tablet Commonly known as:  TYLENOL Take 1,300 mg by mouth at bedtime.   atorvastatin 10 MG tablet Commonly known as:  LIPITOR Take 1 tablet (10 mg total) by mouth at bedtime.   diltiazem 74 MG 24 hr capsule Commonly known as:  DILACOR XR Take 1 capsule (240 mg total) by mouth daily.   diphenhydrAMINE 25 MG tablet Commonly known as:  BENADRYL Take 25 mg by mouth 2 (two) times daily as needed for allergies.   docusate sodium 100 MG capsule Commonly known as:  COLACE Take 1 capsule (100 mg total) by mouth 2 (two) times daily as needed for mild constipation.   ferrous sulfate 325 (65 FE) MG tablet Take 1 tablet (325 mg total) by mouth 2 (two) times daily with a meal.   HYDROcodone-acetaminophen 7.5-325 MG tablet Commonly known as:  NORCO Take 1-2 tablets by mouth every 4 (four) hours as needed for severe pain (1 tab for 5-7 and 2 tabs for 8-10/10).   MULTI-VITAMINS Tabs Take 1 tablet by mouth daily.   polyethylene glycol packet Commonly known as:  MIRALAX / GLYCOLAX Take 17 g by mouth daily as needed for mild constipation.   ranitidine 150 MG tablet Commonly known as:  ZANTAC Take 150 mg by mouth daily.   sucralfate 1 g tablet Commonly known as:  CARAFATE Take 1 tablet (1 g total) by mouth 2 (two) times daily.   traZODone 50 MG tablet Commonly known as:  DESYREL Take 0.5-1 tablets (25-50 mg total) by mouth at bedtime as needed for sleep.   venlafaxine XR 75 MG 24 hr capsule Commonly known as:  EFFEXOR XR Take 1 capsule (75 mg total) by mouth daily with breakfast.   warfarin 1 MG tablet Commonly known as:  COUMADIN Take 1 tablet (1 mg total) by mouth daily. Give with the 6 mg tablet to equal 7 mg   warfarin 6 MG tablet Commonly  known as:  COUMADIN Take 1 tablet (6 mg total) by mouth at bedtime.        DISCHARGE INSTRUCTIONS:  See AVS. If you experience worsening of your admission symptoms, develop shortness of breath, life threatening emergency, suicidal or homicidal thoughts you must seek medical attention immediately by calling 911 or calling your MD immediately  if symptoms less severe.  You Must read complete instructions/literature along with all the possible adverse reactions/side effects for all the Medicines you take and that have been prescribed to you. Take any new Medicines after you have completely understood and accpet all the possible adverse reactions/side effects.   Please note  You were cared for by a hospitalist during your hospital stay. If you have any questions about your discharge medications or the care you received while you were in the hospital after you  are discharged, you can call the unit and asked to speak with the hospitalist on call if the hospitalist that took care of you is not available. Once you are discharged, your primary care physician will handle any further medical issues. Please note that NO REFILLS for any discharge medications will be authorized once you are discharged, as it is imperative that you return to your primary care physician (or establish a relationship with a primary care physician if you do not have one) for your aftercare needs so that they can reassess your need for medications and monitor your lab values.    On the day of Discharge:  VITAL SIGNS:  Blood pressure 106/65, pulse (!) 107, temperature 98.7 F (37.1 C), temperature source Oral, resp. rate 18, height 5\' 3"  (1.6 m), weight 76.2 kg, SpO2 97 %. PHYSICAL EXAMINATION:  GENERAL:  73 y.o.-year-old patient lying in the bed with no acute distress.  EYES: Pupils equal, round, reactive to light and accommodation. No scleral icterus. Extraocular muscles intact.  HEENT: Head atraumatic, normocephalic.  Oropharynx and nasopharynx clear.  NECK:  Supple, no jugular venous distention. No thyroid enlargement, no tenderness.  LUNGS: Normal breath sounds bilaterally, no wheezing, rales,rhonchi or crepitation. No use of accessory muscles of respiration.  CARDIOVASCULAR: S1, S2 normal. No murmurs, rubs, or gallops.  ABDOMEN: Soft, non-tender, non-distended. Bowel sounds present. No organomegaly or mass.  EXTREMITIES: No pedal edema, cyanosis, or clubbing.  NEUROLOGIC: Cranial nerves II through XII are intact. Muscle strength 5/5 in all extremities. Sensation intact. Gait not checked.  PSYCHIATRIC: The patient is alert and oriented x 3.  SKIN: No obvious rash, lesion, or ulcer.  DATA REVIEW:   CBC Recent Labs  Lab 06/26/18 0405  WBC 7.3  HGB 10.3*  HCT 32.6*  PLT 286    Chemistries  Recent Labs  Lab 06/26/18 0405  NA 133*  K 5.2*  CL 107  CO2 19*  GLUCOSE 105*  BUN 42*  CREATININE 1.14*  CALCIUM 8.9     Microbiology Results  Results for orders placed or performed in visit on 11/14/17  Microscopic Examination     Status: Abnormal   Collection Time: 11/14/17  9:56 AM  Result Value Ref Range Status   WBC, UA 6-10 (A) 0 - 5 /hpf Final   RBC, UA 0-2 0 - 2 /hpf Final   Epithelial Cells (non renal) 0-10 0 - 10 /hpf Final   Bacteria, UA Few None seen/Few Final    RADIOLOGY:  US Venous Img Lower Unilateral Left  Result Date: 06/25/2018 CLINICAL DATA:  LEFT leg pain and swelling question postoperative DVT, LEFT lower extremity edema for 2 weeks, history of atrial fibrillation, cardiomyopathy, CHF, hypertension EXAM: LEFT LOWER EXTREMITY VENOUS DOPPLER ULTRASOUND TECHNIQUE: Gray-scale sonography with graded compression, as well as color Doppler and duplex ultrasound were performed to evaluate the lower extremity deep venous systems from the level of the common femoral vein and including the common femoral, femoral, profunda femoral, popliteal and calf veins including the posterior  tibial, peroneal and gastrocnemius veins when visible. The superficial great saphenous vein was also interrogated. Spectral Doppler was utilized to evaluate flow at rest and with distal augmentation maneuvers in the common femoral, femoral and popliteal veins. COMPARISON:  None. FINDINGS: Contralateral Common Femoral Vein: Respiratory phasicity is normal and symmetric with the symptomatic side. No evidence of thrombus. Normal compressibility. Common Femoral Vein: No evidence of thrombus. Normal compressibility, respiratory phasicity and response to augmentation. Saphenofemoral Junction: No evidence of thrombus.  Normal compressibility and flow on color Doppler imaging. Profunda Femoral Vein: No evidence of thrombus. Normal compressibility and flow on color Doppler imaging. Femoral Vein: No evidence of thrombus. Normal compressibility, respiratory phasicity and response to augmentation. Popliteal Vein: No evidence of thrombus. Normal compressibility, respiratory phasicity and response to augmentation. Calf Veins: Inadequately visualized due to soft tissue swelling. Superficial Great Saphenous Vein: No evidence of thrombus. Normal compressibility. Venous Reflux:  None. Other Findings:  None. IMPRESSION: No evidence of deep venous thrombosis in the LEFT lower extremity. LEFT calf veins not adequately visualized for assessment. Electronically Signed   By: Lavonia Dana M.D.   On: 06/25/2018 19:09     Management plans discussed with the patient, her daughter and caregiver and they are in agreement.  CODE STATUS: Full Code   TOTAL TIME TAKING CARE OF THIS PATIENT: 27 minutes.    Demetrios Loll M.D on 06/26/2018 at 12:44 PM  Between 7am to 6pm - Pager - [phone]  After 6pm go to [email or web address] - Proofreader  [facility]  Office  [phone]  CC: Primary care physician; Guadalupe Maple, MD   Note: This dictation was prepared with Dragon dictation along with smaller phrase  technology. Any transcriptional errors that result from this process are unintentional.

## 2018-06-26 NOTE — Telephone Encounter (Signed)
Called and let Marlowe Kays know that [name] gave the ok for verbal orders.

## 2018-06-27 ENCOUNTER — Telehealth: Payer: Self-pay

## 2018-06-27 NOTE — Telephone Encounter (Signed)
Transition Care Management Follow-up Telephone Call  Date of discharge and from where: [facility] on 06/26/18  How have you been since you were released from the hospital? Doing well, feels much better. Declines any current or worsening s/s.   Any questions or concerns? No   Items Reviewed:  Did the pt receive and understand the discharge instructions provided? Yes   Medications obtained and verified? Yes   Any new allergies since your discharge? No   Dietary orders reviewed? Yes  Do you have support at home? Yes   Other (ie: DME, Home Health, etc) N/A  Functional Questionnaire: (I = Independent and D = Dependent)  Bathing/Dressing- I   Meal Prep- Not currently  Eating- I  Maintaining continence- I  Transferring/Ambulation- Uses a walker.  Managing Meds- I   Follow up appointments reviewed:    PCP Hospital f/u appt confirmed? Yes  Scheduled to see [name] on 07/03/18 @ 9:30 AM.  [facility] f/u appt confirmed? Yes  Are transportation arrangements needed? No   If their condition worsens, is the pt aware to call  their PCP or go to the ED? Yes  Was the patient provided with contact information for the PCP's office or ED? Yes  Was the pt encouraged to call back with questions or concerns? Yes

## 2018-06-28 DIAGNOSIS — M810 Age-related osteoporosis without current pathological fracture: Secondary | ICD-10-CM | POA: Diagnosis not present

## 2018-06-28 DIAGNOSIS — Z9181 History of falling: Secondary | ICD-10-CM | POA: Diagnosis not present

## 2018-06-28 DIAGNOSIS — I429 Cardiomyopathy, unspecified: Secondary | ICD-10-CM | POA: Diagnosis not present

## 2018-06-28 DIAGNOSIS — Z85828 Personal history of other malignant neoplasm of skin: Secondary | ICD-10-CM | POA: Diagnosis not present

## 2018-06-28 DIAGNOSIS — E1159 Type 2 diabetes mellitus with other circulatory complications: Secondary | ICD-10-CM | POA: Diagnosis not present

## 2018-06-28 DIAGNOSIS — I351 Nonrheumatic aortic (valve) insufficiency: Secondary | ICD-10-CM | POA: Diagnosis not present

## 2018-06-28 DIAGNOSIS — G4700 Insomnia, unspecified: Secondary | ICD-10-CM | POA: Diagnosis not present

## 2018-06-28 DIAGNOSIS — K219 Gastro-esophageal reflux disease without esophagitis: Secondary | ICD-10-CM | POA: Diagnosis not present

## 2018-06-28 DIAGNOSIS — K5790 Diverticulosis of intestine, part unspecified, without perforation or abscess without bleeding: Secondary | ICD-10-CM | POA: Diagnosis not present

## 2018-06-28 DIAGNOSIS — E785 Hyperlipidemia, unspecified: Secondary | ICD-10-CM | POA: Diagnosis not present

## 2018-06-28 DIAGNOSIS — Z7901 Long term (current) use of anticoagulants: Secondary | ICD-10-CM | POA: Diagnosis not present

## 2018-06-28 DIAGNOSIS — M8580 Other specified disorders of bone density and structure, unspecified site: Secondary | ICD-10-CM | POA: Diagnosis not present

## 2018-06-28 DIAGNOSIS — I152 Hypertension secondary to endocrine disorders: Secondary | ICD-10-CM | POA: Diagnosis not present

## 2018-06-28 DIAGNOSIS — Z96652 Presence of left artificial knee joint: Secondary | ICD-10-CM | POA: Diagnosis not present

## 2018-06-28 DIAGNOSIS — S72142D Displaced intertrochanteric fracture of left femur, subsequent encounter for closed fracture with routine healing: Secondary | ICD-10-CM | POA: Diagnosis not present

## 2018-06-28 DIAGNOSIS — D509 Iron deficiency anemia, unspecified: Secondary | ICD-10-CM | POA: Diagnosis not present

## 2018-06-28 DIAGNOSIS — E1122 Type 2 diabetes mellitus with diabetic chronic kidney disease: Secondary | ICD-10-CM | POA: Diagnosis not present

## 2018-06-28 DIAGNOSIS — D631 Anemia in chronic kidney disease: Secondary | ICD-10-CM | POA: Diagnosis not present

## 2018-06-28 DIAGNOSIS — N183 Chronic kidney disease, stage 3 (moderate): Secondary | ICD-10-CM | POA: Diagnosis not present

## 2018-06-28 DIAGNOSIS — I4821 Permanent atrial fibrillation: Secondary | ICD-10-CM | POA: Diagnosis not present

## 2018-06-28 DIAGNOSIS — I5032 Chronic diastolic (congestive) heart failure: Secondary | ICD-10-CM | POA: Diagnosis not present

## 2018-07-01 DIAGNOSIS — D509 Iron deficiency anemia, unspecified: Secondary | ICD-10-CM | POA: Diagnosis not present

## 2018-07-01 DIAGNOSIS — E1159 Type 2 diabetes mellitus with other circulatory complications: Secondary | ICD-10-CM | POA: Diagnosis not present

## 2018-07-01 DIAGNOSIS — D631 Anemia in chronic kidney disease: Secondary | ICD-10-CM | POA: Diagnosis not present

## 2018-07-01 DIAGNOSIS — I351 Nonrheumatic aortic (valve) insufficiency: Secondary | ICD-10-CM | POA: Diagnosis not present

## 2018-07-01 DIAGNOSIS — I5032 Chronic diastolic (congestive) heart failure: Secondary | ICD-10-CM | POA: Diagnosis not present

## 2018-07-01 DIAGNOSIS — S72142D Displaced intertrochanteric fracture of left femur, subsequent encounter for closed fracture with routine healing: Secondary | ICD-10-CM | POA: Diagnosis not present

## 2018-07-01 DIAGNOSIS — K5790 Diverticulosis of intestine, part unspecified, without perforation or abscess without bleeding: Secondary | ICD-10-CM | POA: Diagnosis not present

## 2018-07-01 DIAGNOSIS — Z85828 Personal history of other malignant neoplasm of skin: Secondary | ICD-10-CM | POA: Diagnosis not present

## 2018-07-01 DIAGNOSIS — G4700 Insomnia, unspecified: Secondary | ICD-10-CM | POA: Diagnosis not present

## 2018-07-01 DIAGNOSIS — I4821 Permanent atrial fibrillation: Secondary | ICD-10-CM | POA: Diagnosis not present

## 2018-07-01 DIAGNOSIS — I152 Hypertension secondary to endocrine disorders: Secondary | ICD-10-CM | POA: Diagnosis not present

## 2018-07-01 DIAGNOSIS — Z9181 History of falling: Secondary | ICD-10-CM | POA: Diagnosis not present

## 2018-07-01 DIAGNOSIS — E785 Hyperlipidemia, unspecified: Secondary | ICD-10-CM | POA: Diagnosis not present

## 2018-07-01 DIAGNOSIS — Z7901 Long term (current) use of anticoagulants: Secondary | ICD-10-CM | POA: Diagnosis not present

## 2018-07-01 DIAGNOSIS — N183 Chronic kidney disease, stage 3 (moderate): Secondary | ICD-10-CM | POA: Diagnosis not present

## 2018-07-01 DIAGNOSIS — M810 Age-related osteoporosis without current pathological fracture: Secondary | ICD-10-CM | POA: Diagnosis not present

## 2018-07-01 DIAGNOSIS — I429 Cardiomyopathy, unspecified: Secondary | ICD-10-CM | POA: Diagnosis not present

## 2018-07-01 DIAGNOSIS — Z96652 Presence of left artificial knee joint: Secondary | ICD-10-CM | POA: Diagnosis not present

## 2018-07-01 DIAGNOSIS — M8580 Other specified disorders of bone density and structure, unspecified site: Secondary | ICD-10-CM | POA: Diagnosis not present

## 2018-07-01 DIAGNOSIS — E1122 Type 2 diabetes mellitus with diabetic chronic kidney disease: Secondary | ICD-10-CM | POA: Diagnosis not present

## 2018-07-01 DIAGNOSIS — K219 Gastro-esophageal reflux disease without esophagitis: Secondary | ICD-10-CM | POA: Diagnosis not present

## 2018-07-02 DIAGNOSIS — M8580 Other specified disorders of bone density and structure, unspecified site: Secondary | ICD-10-CM | POA: Diagnosis not present

## 2018-07-02 DIAGNOSIS — K5790 Diverticulosis of intestine, part unspecified, without perforation or abscess without bleeding: Secondary | ICD-10-CM | POA: Diagnosis not present

## 2018-07-02 DIAGNOSIS — N183 Chronic kidney disease, stage 3 (moderate): Secondary | ICD-10-CM | POA: Diagnosis not present

## 2018-07-02 DIAGNOSIS — G4700 Insomnia, unspecified: Secondary | ICD-10-CM | POA: Diagnosis not present

## 2018-07-02 DIAGNOSIS — E1122 Type 2 diabetes mellitus with diabetic chronic kidney disease: Secondary | ICD-10-CM | POA: Diagnosis not present

## 2018-07-02 DIAGNOSIS — D509 Iron deficiency anemia, unspecified: Secondary | ICD-10-CM | POA: Diagnosis not present

## 2018-07-02 DIAGNOSIS — K219 Gastro-esophageal reflux disease without esophagitis: Secondary | ICD-10-CM | POA: Diagnosis not present

## 2018-07-02 DIAGNOSIS — Z7901 Long term (current) use of anticoagulants: Secondary | ICD-10-CM | POA: Diagnosis not present

## 2018-07-02 DIAGNOSIS — Z9181 History of falling: Secondary | ICD-10-CM | POA: Diagnosis not present

## 2018-07-02 DIAGNOSIS — Z85828 Personal history of other malignant neoplasm of skin: Secondary | ICD-10-CM | POA: Diagnosis not present

## 2018-07-02 DIAGNOSIS — I351 Nonrheumatic aortic (valve) insufficiency: Secondary | ICD-10-CM | POA: Diagnosis not present

## 2018-07-02 DIAGNOSIS — I4821 Permanent atrial fibrillation: Secondary | ICD-10-CM | POA: Diagnosis not present

## 2018-07-02 DIAGNOSIS — I429 Cardiomyopathy, unspecified: Secondary | ICD-10-CM | POA: Diagnosis not present

## 2018-07-02 DIAGNOSIS — S72142D Displaced intertrochanteric fracture of left femur, subsequent encounter for closed fracture with routine healing: Secondary | ICD-10-CM | POA: Diagnosis not present

## 2018-07-02 DIAGNOSIS — Z96652 Presence of left artificial knee joint: Secondary | ICD-10-CM | POA: Diagnosis not present

## 2018-07-02 DIAGNOSIS — D631 Anemia in chronic kidney disease: Secondary | ICD-10-CM | POA: Diagnosis not present

## 2018-07-02 DIAGNOSIS — M810 Age-related osteoporosis without current pathological fracture: Secondary | ICD-10-CM | POA: Diagnosis not present

## 2018-07-02 DIAGNOSIS — E1159 Type 2 diabetes mellitus with other circulatory complications: Secondary | ICD-10-CM | POA: Diagnosis not present

## 2018-07-02 DIAGNOSIS — E785 Hyperlipidemia, unspecified: Secondary | ICD-10-CM | POA: Diagnosis not present

## 2018-07-02 DIAGNOSIS — I5032 Chronic diastolic (congestive) heart failure: Secondary | ICD-10-CM | POA: Diagnosis not present

## 2018-07-02 DIAGNOSIS — I152 Hypertension secondary to endocrine disorders: Secondary | ICD-10-CM | POA: Diagnosis not present

## 2018-07-03 ENCOUNTER — Encounter

## 2018-07-03 ENCOUNTER — Encounter: Payer: Self-pay | Admitting: Family Medicine

## 2018-07-03 ENCOUNTER — Ambulatory Visit (INDEPENDENT_AMBULATORY_CARE_PROVIDER_SITE_OTHER): Payer: Medicare Other | Admitting: Family Medicine

## 2018-07-03 VITALS — BP 141/101 | HR 92 | Temp 98.6°F | Wt 164.0 lb

## 2018-07-03 DIAGNOSIS — E7849 Other hyperlipidemia: Secondary | ICD-10-CM | POA: Diagnosis not present

## 2018-07-03 DIAGNOSIS — E1169 Type 2 diabetes mellitus with other specified complication: Secondary | ICD-10-CM

## 2018-07-03 DIAGNOSIS — F321 Major depressive disorder, single episode, moderate: Secondary | ICD-10-CM

## 2018-07-03 DIAGNOSIS — I5032 Chronic diastolic (congestive) heart failure: Secondary | ICD-10-CM

## 2018-07-03 DIAGNOSIS — I10 Essential (primary) hypertension: Secondary | ICD-10-CM | POA: Diagnosis not present

## 2018-07-03 DIAGNOSIS — E119 Type 2 diabetes mellitus without complications: Secondary | ICD-10-CM | POA: Diagnosis not present

## 2018-07-03 DIAGNOSIS — I480 Paroxysmal atrial fibrillation: Secondary | ICD-10-CM

## 2018-07-03 DIAGNOSIS — E1159 Type 2 diabetes mellitus with other circulatory complications: Secondary | ICD-10-CM

## 2018-07-03 DIAGNOSIS — S72002S Fracture of unspecified part of neck of left femur, sequela: Secondary | ICD-10-CM

## 2018-07-03 DIAGNOSIS — I152 Hypertension secondary to endocrine disorders: Secondary | ICD-10-CM

## 2018-07-03 DIAGNOSIS — E669 Obesity, unspecified: Secondary | ICD-10-CM

## 2018-07-03 LAB — BAYER DCA HB A1C WAIVED: HB A1C (BAYER DCA - WAIVED): 5.1 % (ref <7.0)

## 2018-07-03 LAB — LP+ALT+AST PICCOLO, WAIVED
ALT (SGPT) Piccolo, Waived: 24 U/L (ref 10–47)
AST (SGOT) Piccolo, Waived: 27 U/L (ref 11–38)
CHOLESTEROL PICCOLO, WAIVED: 156 mg/dL (ref <200)
Chol/HDL Ratio Piccolo,Waive: 2.4 mg/dL
HDL Chol Piccolo, Waived: 65 mg/dL (ref >59)
LDL Chol Calc Piccolo Waived: 62 mg/dL (ref <100)
Triglycerides Piccolo,Waived: 143 mg/dL (ref <150)
VLDL Chol Calc Piccolo,Waive: 29 mg/dL (ref <30)

## 2018-07-03 MED ORDER — VENLAFAXINE HCL ER 75 MG PO CP24: 75.0000 mg | ORAL_CAPSULE | Freq: Every day | ORAL | 2 refills | Status: DC

## 2018-07-03 MED ORDER — ATORVASTATIN CALCIUM 10 MG PO TABS: 10.0000 mg | ORAL_TABLET | Freq: Every day | ORAL | 2 refills | Status: DC

## 2018-07-03 MED ORDER — LISINOPRIL 10 MG PO TABS: 10.0000 mg | ORAL_TABLET | Freq: Every day | ORAL | 2 refills | Status: DC

## 2018-07-03 MED ORDER — DILTIAZEM HCL ER 240 MG PO CP24: 240.0000 mg | ORAL_CAPSULE | Freq: Every day | ORAL | 2 refills | Status: DC

## 2018-07-03 NOTE — Assessment & Plan Note (Signed)
The current medical regimen is effective;  continue present plan and medications.  

## 2018-07-03 NOTE — Progress Notes (Signed)
BP (!) 141/101 (BP Location: Left Arm, Cuff Size: Normal)   Pulse 92   Temp 98.6 F (37 C) (Oral)   Wt 164 lb (74.4 kg)   SpO2 98%   BMI 29.05 kg/m    Subjective:    Patient ID: Natalie Gardner, female    DOB: July 23, 1945, 73 y.o.   MRN: [id number]  HPI: Natalie Gardner is a 73 y.o. female  Chief Complaint  Patient presents with  . Hospitalization Follow-up    fall and broken hip in early January  . Diabetes  . Hyperlipidemia  . Hypertension  Review with patient and caregiver.  Both assist with history. Patient follow-up broken hip and hip surgery and is done well has four-point walker which she uses to assist with walker but is working with physical therapy and doing well. During hospitalizations patient's blood pressure got low and stopped her blood pressure medicine except for diltiazem 240 once a day.  Patient not taking metoprolol, lisinopril, or hydrochlorothiazide. Is taking warfarin with PT/INR followed by cardiology. Taking Lipitor without problems. Not taking any pain medication other than Tylenol not using hydrocodone Also taking venlafaxine without problems. Diabetes is been doing well no hemoglobin A1c checked in the last 6 months so will be checking today.  No low blood sugar spells was using insulin in the hospital.  Transition of [facility] Follow up.   Hospital/Facility:[facility] D/C Physician: Bridgett Larsson D/C Date: 06-26-18  Records Requested: N/A Records Received: every day Records Reviewed: today  Diagnoses on Discharge: AKI AFib DM 2 Htn Hip replacement 2 weeks ago  Date of interactive Contact within 48 hours of discharge:  Contact was through: ER  Date of 7 day or 14 day face-to-face visit:    today  Outpatient Encounter Medications as of 07/03/2018  Medication Sig  . acetaminophen (TYLENOL) 650 MG CR tablet Take 1,300 mg by mouth at bedtime.  Marland Kitchen atorvastatin (LIPITOR) 10 MG tablet Take 1 tablet (10 mg total) by mouth at bedtime.  Marland Kitchen diltiazem  (DILACOR XR) 240 MG 24 hr capsule Take 1 capsule (240 mg total) by mouth daily.  Marland Kitchen docusate sodium (COLACE) 100 MG capsule Take 1 capsule (100 mg total) by mouth 2 (two) times daily as needed for mild constipation.  . ferrous sulfate 325 (65 FE) MG tablet Take 1 tablet (325 mg total) by mouth 2 (two) times daily with a meal.  . Multiple Vitamin (MULTI-VITAMINS) TABS Take 1 tablet by mouth daily.  . ranitidine (ZANTAC) 150 MG tablet Take 150 mg by mouth daily.  . sucralfate (CARAFATE) 1 g tablet Take 1 tablet (1 g total) by mouth 2 (two) times daily.  . traZODone (DESYREL) 50 MG tablet Take 0.5-1 tablets (25-50 mg total) by mouth at bedtime as needed for sleep.  Marland Kitchen venlafaxine XR (EFFEXOR XR) 75 MG 24 hr capsule Take 1 capsule (75 mg total) by mouth daily with breakfast.  . warfarin (COUMADIN) 5 MG tablet Take 5 mg by mouth one time only at 6 PM. Also takes 1.5 tablets (7.5 mg) once weekly.  . [DISCONTINUED] diphenhydrAMINE (BENADRYL) 25 MG tablet Take 25 mg by mouth 2 (two) times daily as needed for allergies.  . [DISCONTINUED] HYDROcodone-acetaminophen (NORCO) 7.5-325 MG tablet Take 1-2 tablets by mouth every 4 (four) hours as needed for severe pain (1 tab for 5-7 and 2 tabs for 8-10/10).  . [DISCONTINUED] polyethylene glycol (MIRALAX / GLYCOLAX) packet Take 17 g by mouth daily as needed for mild constipation.  Marland Kitchen warfarin (  COUMADIN) 1 MG tablet Take 1 tablet (1 mg total) by mouth daily. Give with the 6 mg tablet to equal 7 mg (Patient not taking: Reported on 06/27/2018)  . warfarin (COUMADIN) 6 MG tablet Take 1 tablet (6 mg total) by mouth at bedtime. (Patient not taking: Reported on 06/27/2018)   No facility-administered encounter medications on file as of 07/03/2018.     Diagnostic Tests Reviewed/Disposition: improved and BP now increasoing  Consults: Has cardiology appointment in 2 days  Discharge Instructions cont meds and apts  Disease/illness Education:done  Home Health/Community Services  Discussions/Referrals:N/A  Establishment or re-establishment of referral orders for community resources:N/A  Discussion with other health care providers:N/A  Assessment and Support of treatment regimen adherence:N/A  Appointments Coordinated with: N/A  Education for self-management, independent living, and ADLs: done has care giver  Relevant past medical, surgical, family and social history reviewed and updated as indicated. Interim medical history since our last visit reviewed. Allergies and medications reviewed and updated.  Review of Systems  Constitutional: Negative.   Respiratory: Negative.   Cardiovascular: Negative.     Per HPI unless specifically indicated above     Objective:    BP (!) 141/101 (BP Location: Left Arm, Cuff Size: Normal)   Pulse 92   Temp 98.6 F (37 C) (Oral)   Wt 164 lb (74.4 kg)   SpO2 98%   BMI 29.05 kg/m   Wt Readings from Last 3 Encounters:  07/03/18 164 lb (74.4 kg)  06/25/18 168 lb (76.2 kg)  06/19/18 169 lb 3.2 oz (76.7 kg)    Physical Exam Constitutional:      Appearance: She is well-developed.  HENT:     Head: Normocephalic and atraumatic.  Eyes:     Conjunctiva/sclera: Conjunctivae normal.  Neck:     Musculoskeletal: Normal range of motion.  Cardiovascular:     Rate and Rhythm: Normal rate and regular rhythm.     Heart sounds: Normal heart sounds.  Pulmonary:     Effort: Pulmonary effort is normal.     Breath sounds: Normal breath sounds.  Musculoskeletal: Normal range of motion.  Skin:    Findings: No erythema.  Neurological:     Mental Status: She is alert and oriented to person, place, and time.  Psychiatric:        Behavior: Behavior normal.        Thought Content: Thought content normal.        Judgment: Judgment normal.     Results for orders placed or performed during the hospital encounter of 40/98/11  Basic metabolic panel  Result Value Ref Range   Sodium 130 (L) 135 - 145 mmol/L   Potassium 5.6 (H) 3.5  - 5.1 mmol/L   Chloride 101 98 - 111 mmol/L   CO2 21 (L) 22 - 32 mmol/L   Glucose, Bld 143 (H) 70 - 99 mg/dL   BUN 56 (H) 8 - 23 mg/dL   Creatinine, Ser 1.53 (H) 0.44 - 1.00 mg/dL   Calcium 9.5 8.9 - 10.3 mg/dL   GFR calc non Af Amer 33 (L) >60 mL/min   GFR calc Af Amer 39 (L) >60 mL/min   Anion gap 8 5 - 15  CBC  Result Value Ref Range   WBC 10.6 (H) 4.0 - 10.5 K/uL   RBC 3.40 (L) 3.87 - 5.11 MIL/uL   Hemoglobin 11.4 (L) 12.0 - 15.0 g/dL   HCT 36.4 36.0 - 46.0 %   MCV 107.1 (H) 80.0 - 100.0 fL  MCH 33.5 26.0 - 34.0 pg   MCHC 31.3 30.0 - 36.0 g/dL   RDW 15.9 (H) 11.5 - 15.5 %   Platelets 393 150 - 400 K/uL   nRBC 0.0 0.0 - 0.2 %  Urinalysis, Complete w Microscopic  Result Value Ref Range   Color, Urine YELLOW (A) YELLOW   APPearance CLOUDY (A) CLEAR   Specific Gravity, Urine 1.009 1.005 - 1.030   pH 5.0 5.0 - 8.0   Glucose, UA 50 (A) NEGATIVE mg/dL   Hgb urine dipstick SMALL (A) NEGATIVE   Bilirubin Urine NEGATIVE NEGATIVE   Ketones, ur NEGATIVE NEGATIVE mg/dL   Protein, ur NEGATIVE NEGATIVE mg/dL   Nitrite POSITIVE (A) NEGATIVE   Leukocytes, UA LARGE (A) NEGATIVE   RBC / HPF 11-20 0 - 5 RBC/hpf   WBC, UA >50 (H) 0 - 5 WBC/hpf   Bacteria, UA MANY (A) NONE SEEN   Squamous Epithelial / LPF NONE SEEN 0 - 5   WBC Clumps PRESENT   Protime-INR  Result Value Ref Range   Prothrombin Time 27.3 (H) 11.4 - 15.2 seconds   INR 8.03   Basic metabolic panel  Result Value Ref Range   Sodium 132 (L) 135 - 145 mmol/L   Potassium 6.2 (H) 3.5 - 5.1 mmol/L   Chloride 107 98 - 111 mmol/L   CO2 20 (L) 22 - 32 mmol/L   Glucose, Bld 114 (H) 70 - 99 mg/dL   BUN 51 (H) 8 - 23 mg/dL   Creatinine, Ser 1.46 (H) 0.44 - 1.00 mg/dL   Calcium 8.6 (L) 8.9 - 10.3 mg/dL   GFR calc non Af Amer 35 (L) >60 mL/min   GFR calc Af Amer 41 (L) >60 mL/min   Anion gap 5 5 - 15  Protime-INR  Result Value Ref Range   Prothrombin Time 27.1 (H) 11.4 - 15.2 seconds   INR 2.12   Basic metabolic panel    Result Value Ref Range   Sodium 133 (L) 135 - 145 mmol/L   Potassium 5.2 (H) 3.5 - 5.1 mmol/L   Chloride 107 98 - 111 mmol/L   CO2 19 (L) 22 - 32 mmol/L   Glucose, Bld 105 (H) 70 - 99 mg/dL   BUN 42 (H) 8 - 23 mg/dL   Creatinine, Ser 1.14 (H) 0.44 - 1.00 mg/dL   Calcium 8.9 8.9 - 10.3 mg/dL   GFR calc non Af Amer 48 (L) >60 mL/min   GFR calc Af Amer 55 (L) >60 mL/min   Anion gap 7 5 - 15  CBC  Result Value Ref Range   WBC 7.3 4.0 - 10.5 K/uL   RBC 3.07 (L) 3.87 - 5.11 MIL/uL   Hemoglobin 10.3 (L) 12.0 - 15.0 g/dL   HCT 32.6 (L) 36.0 - 46.0 %   MCV 106.2 (H) 80.0 - 100.0 fL   MCH 33.6 26.0 - 34.0 pg   MCHC 31.6 30.0 - 36.0 g/dL   RDW 15.9 (H) 11.5 - 15.5 %   Platelets 286 150 - 400 K/uL   nRBC 0.0 0.0 - 0.2 %  Potassium  Result Value Ref Range   Potassium 5.6 (H) 3.5 - 5.1 mmol/L      Assessment & Plan:   Problem List Items Addressed This Visit      Cardiovascular and Mediastinum   Essential hypertension    Discuss hypertension poor control today will restart some of the medicines patient was on very gently and gradually by starting with lisinopril  10 mg a day.      Relevant Orders   Basic metabolic panel   CHF (congestive heart failure) ([place])    Followed by cardiology      Atrial fibrillation [facility])    The current medical regimen is effective;  continue present plan and medications.       Obesity, diabetes, and hypertension syndrome ([place])    Diabetes remains diet controlled we will continue diet control and recommend continued care.        Musculoskeletal and Integument   Closed left hip fracture ([place])    Recovering and doing physical therapy        Other   Hyperlipidemia    The current medical regimen is effective;  continue present plan and medications.       Relevant Orders   LP+ALT+AST [place], Waived   Depression, major, single episode, moderate ([place])    The current medical regimen is effective;  continue present plan and medications.         Other Visit Diagnoses    Diabetes mellitus without complication ([place])    -  Primary   Relevant Orders   Bayer DCA Hb A1c Waived       Follow up plan: Return in about 6 months (around 01/01/2019), or if symptoms worsen or fail to improve, for BP check.

## 2018-07-03 NOTE — Assessment & Plan Note (Signed)
Diabetes remains diet controlled we will continue diet control and recommend continued care.

## 2018-07-03 NOTE — Assessment & Plan Note (Signed)
Followed by cardiology 

## 2018-07-03 NOTE — Assessment & Plan Note (Signed)
Recovering and doing physical therapy

## 2018-07-03 NOTE — Assessment & Plan Note (Signed)
Discuss hypertension poor control today will restart some of the medicines patient was on very gently and gradually by starting with lisinopril 10 mg a day.

## 2018-07-04 ENCOUNTER — Telehealth: Payer: Self-pay

## 2018-07-04 ENCOUNTER — Encounter: Payer: Self-pay | Admitting: Family Medicine

## 2018-07-04 DIAGNOSIS — I4821 Permanent atrial fibrillation: Secondary | ICD-10-CM | POA: Diagnosis not present

## 2018-07-04 DIAGNOSIS — S72142D Displaced intertrochanteric fracture of left femur, subsequent encounter for closed fracture with routine healing: Secondary | ICD-10-CM | POA: Diagnosis not present

## 2018-07-04 DIAGNOSIS — I152 Hypertension secondary to endocrine disorders: Secondary | ICD-10-CM | POA: Diagnosis not present

## 2018-07-04 DIAGNOSIS — E785 Hyperlipidemia, unspecified: Secondary | ICD-10-CM | POA: Diagnosis not present

## 2018-07-04 DIAGNOSIS — D631 Anemia in chronic kidney disease: Secondary | ICD-10-CM | POA: Diagnosis not present

## 2018-07-04 DIAGNOSIS — D509 Iron deficiency anemia, unspecified: Secondary | ICD-10-CM | POA: Diagnosis not present

## 2018-07-04 DIAGNOSIS — G4700 Insomnia, unspecified: Secondary | ICD-10-CM | POA: Diagnosis not present

## 2018-07-04 DIAGNOSIS — Z9181 History of falling: Secondary | ICD-10-CM | POA: Diagnosis not present

## 2018-07-04 DIAGNOSIS — M8580 Other specified disorders of bone density and structure, unspecified site: Secondary | ICD-10-CM | POA: Diagnosis not present

## 2018-07-04 DIAGNOSIS — I351 Nonrheumatic aortic (valve) insufficiency: Secondary | ICD-10-CM | POA: Diagnosis not present

## 2018-07-04 DIAGNOSIS — N183 Chronic kidney disease, stage 3 (moderate): Secondary | ICD-10-CM | POA: Diagnosis not present

## 2018-07-04 DIAGNOSIS — K219 Gastro-esophageal reflux disease without esophagitis: Secondary | ICD-10-CM | POA: Diagnosis not present

## 2018-07-04 DIAGNOSIS — M810 Age-related osteoporosis without current pathological fracture: Secondary | ICD-10-CM | POA: Diagnosis not present

## 2018-07-04 DIAGNOSIS — Z7901 Long term (current) use of anticoagulants: Secondary | ICD-10-CM | POA: Diagnosis not present

## 2018-07-04 DIAGNOSIS — E1122 Type 2 diabetes mellitus with diabetic chronic kidney disease: Secondary | ICD-10-CM | POA: Diagnosis not present

## 2018-07-04 DIAGNOSIS — Z96652 Presence of left artificial knee joint: Secondary | ICD-10-CM | POA: Diagnosis not present

## 2018-07-04 DIAGNOSIS — Z85828 Personal history of other malignant neoplasm of skin: Secondary | ICD-10-CM | POA: Diagnosis not present

## 2018-07-04 DIAGNOSIS — I429 Cardiomyopathy, unspecified: Secondary | ICD-10-CM | POA: Diagnosis not present

## 2018-07-04 DIAGNOSIS — K5790 Diverticulosis of intestine, part unspecified, without perforation or abscess without bleeding: Secondary | ICD-10-CM | POA: Diagnosis not present

## 2018-07-04 DIAGNOSIS — E1159 Type 2 diabetes mellitus with other circulatory complications: Secondary | ICD-10-CM | POA: Diagnosis not present

## 2018-07-04 DIAGNOSIS — I5032 Chronic diastolic (congestive) heart failure: Secondary | ICD-10-CM | POA: Diagnosis not present

## 2018-07-04 LAB — BASIC METABOLIC PANEL
BUN/Creatinine Ratio: 17 (ref 12–28)
BUN: 17 mg/dL (ref 8–27)
CALCIUM: 9.5 mg/dL (ref 8.7–10.3)
CO2: 19 mmol/L — AB (ref 20–29)
Chloride: 97 mmol/L (ref 96–106)
Creatinine, Ser: 0.99 mg/dL (ref 0.57–1.00)
GFR calc Af Amer: 65 mL/min/{1.73_m2} (ref >59)
GFR calc non Af Amer: 57 mL/min/{1.73_m2} — ABNORMAL LOW (ref >59)
Glucose: 134 mg/dL — ABNORMAL HIGH (ref 65–99)
Potassium: 4.9 mmol/L (ref 3.5–5.2)
Sodium: 133 mmol/L — ABNORMAL LOW (ref 134–144)

## 2018-07-04 NOTE — Telephone Encounter (Signed)
Copied from [place] [id number]. Topic: General - Other >> Jul 04, 2018 10:23 AM Yvette Rack wrote: Reason for CRM: Pt caregiver Jeryl Columbia stated pt does not have an appetite and she would like to know if [name] has any recommendations to help with pt appetite. Cb# [phone] >> Jul 04, 2018 11:51 AM Guadalupe Maple, MD wrote: Uses supplements such as Ensure multiple vitamins and observe for now

## 2018-07-05 DIAGNOSIS — I952 Hypotension due to drugs: Secondary | ICD-10-CM | POA: Diagnosis not present

## 2018-07-05 DIAGNOSIS — I482 Chronic atrial fibrillation, unspecified: Secondary | ICD-10-CM | POA: Diagnosis not present

## 2018-07-05 DIAGNOSIS — R001 Bradycardia, unspecified: Secondary | ICD-10-CM | POA: Diagnosis not present

## 2018-07-05 DIAGNOSIS — E782 Mixed hyperlipidemia: Secondary | ICD-10-CM | POA: Diagnosis not present

## 2018-07-05 NOTE — Telephone Encounter (Signed)
Patient caregiver notified

## 2018-07-08 DIAGNOSIS — Z85828 Personal history of other malignant neoplasm of skin: Secondary | ICD-10-CM | POA: Diagnosis not present

## 2018-07-08 DIAGNOSIS — Z9181 History of falling: Secondary | ICD-10-CM | POA: Diagnosis not present

## 2018-07-08 DIAGNOSIS — I152 Hypertension secondary to endocrine disorders: Secondary | ICD-10-CM | POA: Diagnosis not present

## 2018-07-08 DIAGNOSIS — E1159 Type 2 diabetes mellitus with other circulatory complications: Secondary | ICD-10-CM | POA: Diagnosis not present

## 2018-07-08 DIAGNOSIS — Z96652 Presence of left artificial knee joint: Secondary | ICD-10-CM | POA: Diagnosis not present

## 2018-07-08 DIAGNOSIS — D631 Anemia in chronic kidney disease: Secondary | ICD-10-CM | POA: Diagnosis not present

## 2018-07-08 DIAGNOSIS — I351 Nonrheumatic aortic (valve) insufficiency: Secondary | ICD-10-CM | POA: Diagnosis not present

## 2018-07-08 DIAGNOSIS — E1122 Type 2 diabetes mellitus with diabetic chronic kidney disease: Secondary | ICD-10-CM | POA: Diagnosis not present

## 2018-07-08 DIAGNOSIS — Z7901 Long term (current) use of anticoagulants: Secondary | ICD-10-CM | POA: Diagnosis not present

## 2018-07-08 DIAGNOSIS — K5790 Diverticulosis of intestine, part unspecified, without perforation or abscess without bleeding: Secondary | ICD-10-CM | POA: Diagnosis not present

## 2018-07-08 DIAGNOSIS — E785 Hyperlipidemia, unspecified: Secondary | ICD-10-CM | POA: Diagnosis not present

## 2018-07-08 DIAGNOSIS — G4700 Insomnia, unspecified: Secondary | ICD-10-CM | POA: Diagnosis not present

## 2018-07-08 DIAGNOSIS — M810 Age-related osteoporosis without current pathological fracture: Secondary | ICD-10-CM | POA: Diagnosis not present

## 2018-07-08 DIAGNOSIS — I5032 Chronic diastolic (congestive) heart failure: Secondary | ICD-10-CM | POA: Diagnosis not present

## 2018-07-08 DIAGNOSIS — I429 Cardiomyopathy, unspecified: Secondary | ICD-10-CM | POA: Diagnosis not present

## 2018-07-08 DIAGNOSIS — I4821 Permanent atrial fibrillation: Secondary | ICD-10-CM | POA: Diagnosis not present

## 2018-07-08 DIAGNOSIS — K219 Gastro-esophageal reflux disease without esophagitis: Secondary | ICD-10-CM | POA: Diagnosis not present

## 2018-07-08 DIAGNOSIS — D509 Iron deficiency anemia, unspecified: Secondary | ICD-10-CM | POA: Diagnosis not present

## 2018-07-08 DIAGNOSIS — S72142D Displaced intertrochanteric fracture of left femur, subsequent encounter for closed fracture with routine healing: Secondary | ICD-10-CM | POA: Diagnosis not present

## 2018-07-08 DIAGNOSIS — M8580 Other specified disorders of bone density and structure, unspecified site: Secondary | ICD-10-CM | POA: Diagnosis not present

## 2018-07-08 DIAGNOSIS — N183 Chronic kidney disease, stage 3 (moderate): Secondary | ICD-10-CM | POA: Diagnosis not present

## 2018-07-09 DIAGNOSIS — I351 Nonrheumatic aortic (valve) insufficiency: Secondary | ICD-10-CM | POA: Diagnosis not present

## 2018-07-09 DIAGNOSIS — Z96652 Presence of left artificial knee joint: Secondary | ICD-10-CM | POA: Diagnosis not present

## 2018-07-09 DIAGNOSIS — E785 Hyperlipidemia, unspecified: Secondary | ICD-10-CM | POA: Diagnosis not present

## 2018-07-09 DIAGNOSIS — S72142D Displaced intertrochanteric fracture of left femur, subsequent encounter for closed fracture with routine healing: Secondary | ICD-10-CM | POA: Diagnosis not present

## 2018-07-09 DIAGNOSIS — M810 Age-related osteoporosis without current pathological fracture: Secondary | ICD-10-CM | POA: Diagnosis not present

## 2018-07-09 DIAGNOSIS — D631 Anemia in chronic kidney disease: Secondary | ICD-10-CM | POA: Diagnosis not present

## 2018-07-09 DIAGNOSIS — G4700 Insomnia, unspecified: Secondary | ICD-10-CM | POA: Diagnosis not present

## 2018-07-09 DIAGNOSIS — E1122 Type 2 diabetes mellitus with diabetic chronic kidney disease: Secondary | ICD-10-CM | POA: Diagnosis not present

## 2018-07-09 DIAGNOSIS — E1159 Type 2 diabetes mellitus with other circulatory complications: Secondary | ICD-10-CM | POA: Diagnosis not present

## 2018-07-09 DIAGNOSIS — Z7901 Long term (current) use of anticoagulants: Secondary | ICD-10-CM | POA: Diagnosis not present

## 2018-07-09 DIAGNOSIS — I4821 Permanent atrial fibrillation: Secondary | ICD-10-CM | POA: Diagnosis not present

## 2018-07-09 DIAGNOSIS — Z85828 Personal history of other malignant neoplasm of skin: Secondary | ICD-10-CM | POA: Diagnosis not present

## 2018-07-09 DIAGNOSIS — N183 Chronic kidney disease, stage 3 (moderate): Secondary | ICD-10-CM | POA: Diagnosis not present

## 2018-07-09 DIAGNOSIS — Z9181 History of falling: Secondary | ICD-10-CM | POA: Diagnosis not present

## 2018-07-09 DIAGNOSIS — I152 Hypertension secondary to endocrine disorders: Secondary | ICD-10-CM | POA: Diagnosis not present

## 2018-07-09 DIAGNOSIS — K219 Gastro-esophageal reflux disease without esophagitis: Secondary | ICD-10-CM | POA: Diagnosis not present

## 2018-07-09 DIAGNOSIS — D509 Iron deficiency anemia, unspecified: Secondary | ICD-10-CM | POA: Diagnosis not present

## 2018-07-09 DIAGNOSIS — K5790 Diverticulosis of intestine, part unspecified, without perforation or abscess without bleeding: Secondary | ICD-10-CM | POA: Diagnosis not present

## 2018-07-09 DIAGNOSIS — I429 Cardiomyopathy, unspecified: Secondary | ICD-10-CM | POA: Diagnosis not present

## 2018-07-09 DIAGNOSIS — I5032 Chronic diastolic (congestive) heart failure: Secondary | ICD-10-CM | POA: Diagnosis not present

## 2018-07-09 DIAGNOSIS — M8580 Other specified disorders of bone density and structure, unspecified site: Secondary | ICD-10-CM | POA: Diagnosis not present

## 2018-07-11 DIAGNOSIS — D631 Anemia in chronic kidney disease: Secondary | ICD-10-CM | POA: Diagnosis not present

## 2018-07-11 DIAGNOSIS — Z85828 Personal history of other malignant neoplasm of skin: Secondary | ICD-10-CM | POA: Diagnosis not present

## 2018-07-11 DIAGNOSIS — I351 Nonrheumatic aortic (valve) insufficiency: Secondary | ICD-10-CM | POA: Diagnosis not present

## 2018-07-11 DIAGNOSIS — K5790 Diverticulosis of intestine, part unspecified, without perforation or abscess without bleeding: Secondary | ICD-10-CM | POA: Diagnosis not present

## 2018-07-11 DIAGNOSIS — I4821 Permanent atrial fibrillation: Secondary | ICD-10-CM | POA: Diagnosis not present

## 2018-07-11 DIAGNOSIS — N183 Chronic kidney disease, stage 3 (moderate): Secondary | ICD-10-CM | POA: Diagnosis not present

## 2018-07-11 DIAGNOSIS — I429 Cardiomyopathy, unspecified: Secondary | ICD-10-CM | POA: Diagnosis not present

## 2018-07-11 DIAGNOSIS — M810 Age-related osteoporosis without current pathological fracture: Secondary | ICD-10-CM | POA: Diagnosis not present

## 2018-07-11 DIAGNOSIS — E785 Hyperlipidemia, unspecified: Secondary | ICD-10-CM | POA: Diagnosis not present

## 2018-07-11 DIAGNOSIS — I152 Hypertension secondary to endocrine disorders: Secondary | ICD-10-CM | POA: Diagnosis not present

## 2018-07-11 DIAGNOSIS — K219 Gastro-esophageal reflux disease without esophagitis: Secondary | ICD-10-CM | POA: Diagnosis not present

## 2018-07-11 DIAGNOSIS — G4700 Insomnia, unspecified: Secondary | ICD-10-CM | POA: Diagnosis not present

## 2018-07-11 DIAGNOSIS — Z9181 History of falling: Secondary | ICD-10-CM | POA: Diagnosis not present

## 2018-07-11 DIAGNOSIS — E1159 Type 2 diabetes mellitus with other circulatory complications: Secondary | ICD-10-CM | POA: Diagnosis not present

## 2018-07-11 DIAGNOSIS — E1122 Type 2 diabetes mellitus with diabetic chronic kidney disease: Secondary | ICD-10-CM | POA: Diagnosis not present

## 2018-07-11 DIAGNOSIS — D509 Iron deficiency anemia, unspecified: Secondary | ICD-10-CM | POA: Diagnosis not present

## 2018-07-11 DIAGNOSIS — Z96652 Presence of left artificial knee joint: Secondary | ICD-10-CM | POA: Diagnosis not present

## 2018-07-11 DIAGNOSIS — M8580 Other specified disorders of bone density and structure, unspecified site: Secondary | ICD-10-CM | POA: Diagnosis not present

## 2018-07-11 DIAGNOSIS — Z7901 Long term (current) use of anticoagulants: Secondary | ICD-10-CM | POA: Diagnosis not present

## 2018-07-11 DIAGNOSIS — S72142D Displaced intertrochanteric fracture of left femur, subsequent encounter for closed fracture with routine healing: Secondary | ICD-10-CM | POA: Diagnosis not present

## 2018-07-11 DIAGNOSIS — I5032 Chronic diastolic (congestive) heart failure: Secondary | ICD-10-CM | POA: Diagnosis not present

## 2018-07-15 DIAGNOSIS — I152 Hypertension secondary to endocrine disorders: Secondary | ICD-10-CM | POA: Diagnosis not present

## 2018-07-15 DIAGNOSIS — G4700 Insomnia, unspecified: Secondary | ICD-10-CM | POA: Diagnosis not present

## 2018-07-15 DIAGNOSIS — I5032 Chronic diastolic (congestive) heart failure: Secondary | ICD-10-CM | POA: Diagnosis not present

## 2018-07-15 DIAGNOSIS — Z9181 History of falling: Secondary | ICD-10-CM | POA: Diagnosis not present

## 2018-07-15 DIAGNOSIS — S72142D Displaced intertrochanteric fracture of left femur, subsequent encounter for closed fracture with routine healing: Secondary | ICD-10-CM | POA: Diagnosis not present

## 2018-07-15 DIAGNOSIS — I4821 Permanent atrial fibrillation: Secondary | ICD-10-CM | POA: Diagnosis not present

## 2018-07-15 DIAGNOSIS — K219 Gastro-esophageal reflux disease without esophagitis: Secondary | ICD-10-CM | POA: Diagnosis not present

## 2018-07-15 DIAGNOSIS — I429 Cardiomyopathy, unspecified: Secondary | ICD-10-CM | POA: Diagnosis not present

## 2018-07-15 DIAGNOSIS — Z7901 Long term (current) use of anticoagulants: Secondary | ICD-10-CM | POA: Diagnosis not present

## 2018-07-15 DIAGNOSIS — K5790 Diverticulosis of intestine, part unspecified, without perforation or abscess without bleeding: Secondary | ICD-10-CM | POA: Diagnosis not present

## 2018-07-15 DIAGNOSIS — M810 Age-related osteoporosis without current pathological fracture: Secondary | ICD-10-CM | POA: Diagnosis not present

## 2018-07-15 DIAGNOSIS — E785 Hyperlipidemia, unspecified: Secondary | ICD-10-CM | POA: Diagnosis not present

## 2018-07-15 DIAGNOSIS — Z96652 Presence of left artificial knee joint: Secondary | ICD-10-CM | POA: Diagnosis not present

## 2018-07-15 DIAGNOSIS — M8580 Other specified disorders of bone density and structure, unspecified site: Secondary | ICD-10-CM | POA: Diagnosis not present

## 2018-07-15 DIAGNOSIS — N183 Chronic kidney disease, stage 3 (moderate): Secondary | ICD-10-CM | POA: Diagnosis not present

## 2018-07-15 DIAGNOSIS — I351 Nonrheumatic aortic (valve) insufficiency: Secondary | ICD-10-CM | POA: Diagnosis not present

## 2018-07-15 DIAGNOSIS — D509 Iron deficiency anemia, unspecified: Secondary | ICD-10-CM | POA: Diagnosis not present

## 2018-07-15 DIAGNOSIS — E1122 Type 2 diabetes mellitus with diabetic chronic kidney disease: Secondary | ICD-10-CM | POA: Diagnosis not present

## 2018-07-15 DIAGNOSIS — D631 Anemia in chronic kidney disease: Secondary | ICD-10-CM | POA: Diagnosis not present

## 2018-07-15 DIAGNOSIS — Z85828 Personal history of other malignant neoplasm of skin: Secondary | ICD-10-CM | POA: Diagnosis not present

## 2018-07-15 DIAGNOSIS — E1159 Type 2 diabetes mellitus with other circulatory complications: Secondary | ICD-10-CM | POA: Diagnosis not present

## 2018-07-16 DIAGNOSIS — E1122 Type 2 diabetes mellitus with diabetic chronic kidney disease: Secondary | ICD-10-CM | POA: Diagnosis not present

## 2018-07-16 DIAGNOSIS — Z9181 History of falling: Secondary | ICD-10-CM | POA: Diagnosis not present

## 2018-07-16 DIAGNOSIS — I5032 Chronic diastolic (congestive) heart failure: Secondary | ICD-10-CM | POA: Diagnosis not present

## 2018-07-16 DIAGNOSIS — I4821 Permanent atrial fibrillation: Secondary | ICD-10-CM | POA: Diagnosis not present

## 2018-07-16 DIAGNOSIS — M810 Age-related osteoporosis without current pathological fracture: Secondary | ICD-10-CM | POA: Diagnosis not present

## 2018-07-16 DIAGNOSIS — K219 Gastro-esophageal reflux disease without esophagitis: Secondary | ICD-10-CM | POA: Diagnosis not present

## 2018-07-16 DIAGNOSIS — M8580 Other specified disorders of bone density and structure, unspecified site: Secondary | ICD-10-CM | POA: Diagnosis not present

## 2018-07-16 DIAGNOSIS — Z85828 Personal history of other malignant neoplasm of skin: Secondary | ICD-10-CM | POA: Diagnosis not present

## 2018-07-16 DIAGNOSIS — K5790 Diverticulosis of intestine, part unspecified, without perforation or abscess without bleeding: Secondary | ICD-10-CM | POA: Diagnosis not present

## 2018-07-16 DIAGNOSIS — E1159 Type 2 diabetes mellitus with other circulatory complications: Secondary | ICD-10-CM | POA: Diagnosis not present

## 2018-07-16 DIAGNOSIS — G4700 Insomnia, unspecified: Secondary | ICD-10-CM | POA: Diagnosis not present

## 2018-07-16 DIAGNOSIS — E785 Hyperlipidemia, unspecified: Secondary | ICD-10-CM | POA: Diagnosis not present

## 2018-07-16 DIAGNOSIS — I429 Cardiomyopathy, unspecified: Secondary | ICD-10-CM | POA: Diagnosis not present

## 2018-07-16 DIAGNOSIS — D509 Iron deficiency anemia, unspecified: Secondary | ICD-10-CM | POA: Diagnosis not present

## 2018-07-16 DIAGNOSIS — D631 Anemia in chronic kidney disease: Secondary | ICD-10-CM | POA: Diagnosis not present

## 2018-07-16 DIAGNOSIS — Z7901 Long term (current) use of anticoagulants: Secondary | ICD-10-CM | POA: Diagnosis not present

## 2018-07-16 DIAGNOSIS — N183 Chronic kidney disease, stage 3 (moderate): Secondary | ICD-10-CM | POA: Diagnosis not present

## 2018-07-16 DIAGNOSIS — S72142D Displaced intertrochanteric fracture of left femur, subsequent encounter for closed fracture with routine healing: Secondary | ICD-10-CM | POA: Diagnosis not present

## 2018-07-16 DIAGNOSIS — Z96652 Presence of left artificial knee joint: Secondary | ICD-10-CM | POA: Diagnosis not present

## 2018-07-16 DIAGNOSIS — I351 Nonrheumatic aortic (valve) insufficiency: Secondary | ICD-10-CM | POA: Diagnosis not present

## 2018-07-16 DIAGNOSIS — I152 Hypertension secondary to endocrine disorders: Secondary | ICD-10-CM | POA: Diagnosis not present

## 2018-07-17 DIAGNOSIS — Z7901 Long term (current) use of anticoagulants: Secondary | ICD-10-CM | POA: Diagnosis not present

## 2018-07-18 DIAGNOSIS — Z7901 Long term (current) use of anticoagulants: Secondary | ICD-10-CM | POA: Diagnosis not present

## 2018-07-18 DIAGNOSIS — I429 Cardiomyopathy, unspecified: Secondary | ICD-10-CM | POA: Diagnosis not present

## 2018-07-18 DIAGNOSIS — Z96652 Presence of left artificial knee joint: Secondary | ICD-10-CM | POA: Diagnosis not present

## 2018-07-18 DIAGNOSIS — D631 Anemia in chronic kidney disease: Secondary | ICD-10-CM | POA: Diagnosis not present

## 2018-07-18 DIAGNOSIS — I351 Nonrheumatic aortic (valve) insufficiency: Secondary | ICD-10-CM | POA: Diagnosis not present

## 2018-07-18 DIAGNOSIS — N183 Chronic kidney disease, stage 3 (moderate): Secondary | ICD-10-CM | POA: Diagnosis not present

## 2018-07-18 DIAGNOSIS — K5790 Diverticulosis of intestine, part unspecified, without perforation or abscess without bleeding: Secondary | ICD-10-CM | POA: Diagnosis not present

## 2018-07-18 DIAGNOSIS — I152 Hypertension secondary to endocrine disorders: Secondary | ICD-10-CM | POA: Diagnosis not present

## 2018-07-18 DIAGNOSIS — E1122 Type 2 diabetes mellitus with diabetic chronic kidney disease: Secondary | ICD-10-CM | POA: Diagnosis not present

## 2018-07-18 DIAGNOSIS — E785 Hyperlipidemia, unspecified: Secondary | ICD-10-CM | POA: Diagnosis not present

## 2018-07-18 DIAGNOSIS — M810 Age-related osteoporosis without current pathological fracture: Secondary | ICD-10-CM | POA: Diagnosis not present

## 2018-07-18 DIAGNOSIS — I4821 Permanent atrial fibrillation: Secondary | ICD-10-CM | POA: Diagnosis not present

## 2018-07-18 DIAGNOSIS — I5032 Chronic diastolic (congestive) heart failure: Secondary | ICD-10-CM | POA: Diagnosis not present

## 2018-07-18 DIAGNOSIS — M8580 Other specified disorders of bone density and structure, unspecified site: Secondary | ICD-10-CM | POA: Diagnosis not present

## 2018-07-18 DIAGNOSIS — E1159 Type 2 diabetes mellitus with other circulatory complications: Secondary | ICD-10-CM | POA: Diagnosis not present

## 2018-07-18 DIAGNOSIS — Z9181 History of falling: Secondary | ICD-10-CM | POA: Diagnosis not present

## 2018-07-18 DIAGNOSIS — S72142D Displaced intertrochanteric fracture of left femur, subsequent encounter for closed fracture with routine healing: Secondary | ICD-10-CM | POA: Diagnosis not present

## 2018-07-18 DIAGNOSIS — Z85828 Personal history of other malignant neoplasm of skin: Secondary | ICD-10-CM | POA: Diagnosis not present

## 2018-07-18 DIAGNOSIS — G4700 Insomnia, unspecified: Secondary | ICD-10-CM | POA: Diagnosis not present

## 2018-07-18 DIAGNOSIS — K219 Gastro-esophageal reflux disease without esophagitis: Secondary | ICD-10-CM | POA: Diagnosis not present

## 2018-07-18 DIAGNOSIS — D509 Iron deficiency anemia, unspecified: Secondary | ICD-10-CM | POA: Diagnosis not present

## 2018-07-22 DIAGNOSIS — M8580 Other specified disorders of bone density and structure, unspecified site: Secondary | ICD-10-CM | POA: Diagnosis not present

## 2018-07-22 DIAGNOSIS — I351 Nonrheumatic aortic (valve) insufficiency: Secondary | ICD-10-CM | POA: Diagnosis not present

## 2018-07-22 DIAGNOSIS — K5790 Diverticulosis of intestine, part unspecified, without perforation or abscess without bleeding: Secondary | ICD-10-CM | POA: Diagnosis not present

## 2018-07-22 DIAGNOSIS — E1122 Type 2 diabetes mellitus with diabetic chronic kidney disease: Secondary | ICD-10-CM | POA: Diagnosis not present

## 2018-07-22 DIAGNOSIS — I152 Hypertension secondary to endocrine disorders: Secondary | ICD-10-CM | POA: Diagnosis not present

## 2018-07-22 DIAGNOSIS — N183 Chronic kidney disease, stage 3 (moderate): Secondary | ICD-10-CM | POA: Diagnosis not present

## 2018-07-22 DIAGNOSIS — G4700 Insomnia, unspecified: Secondary | ICD-10-CM | POA: Diagnosis not present

## 2018-07-22 DIAGNOSIS — I4821 Permanent atrial fibrillation: Secondary | ICD-10-CM | POA: Diagnosis not present

## 2018-07-22 DIAGNOSIS — K219 Gastro-esophageal reflux disease without esophagitis: Secondary | ICD-10-CM | POA: Diagnosis not present

## 2018-07-22 DIAGNOSIS — Z96652 Presence of left artificial knee joint: Secondary | ICD-10-CM | POA: Diagnosis not present

## 2018-07-22 DIAGNOSIS — Z9181 History of falling: Secondary | ICD-10-CM | POA: Diagnosis not present

## 2018-07-22 DIAGNOSIS — D631 Anemia in chronic kidney disease: Secondary | ICD-10-CM | POA: Diagnosis not present

## 2018-07-22 DIAGNOSIS — E785 Hyperlipidemia, unspecified: Secondary | ICD-10-CM | POA: Diagnosis not present

## 2018-07-22 DIAGNOSIS — I429 Cardiomyopathy, unspecified: Secondary | ICD-10-CM | POA: Diagnosis not present

## 2018-07-22 DIAGNOSIS — I5032 Chronic diastolic (congestive) heart failure: Secondary | ICD-10-CM | POA: Diagnosis not present

## 2018-07-22 DIAGNOSIS — D509 Iron deficiency anemia, unspecified: Secondary | ICD-10-CM | POA: Diagnosis not present

## 2018-07-22 DIAGNOSIS — E1159 Type 2 diabetes mellitus with other circulatory complications: Secondary | ICD-10-CM | POA: Diagnosis not present

## 2018-07-22 DIAGNOSIS — M810 Age-related osteoporosis without current pathological fracture: Secondary | ICD-10-CM | POA: Diagnosis not present

## 2018-07-22 DIAGNOSIS — Z85828 Personal history of other malignant neoplasm of skin: Secondary | ICD-10-CM | POA: Diagnosis not present

## 2018-07-22 DIAGNOSIS — Z7901 Long term (current) use of anticoagulants: Secondary | ICD-10-CM | POA: Diagnosis not present

## 2018-07-22 DIAGNOSIS — S72142D Displaced intertrochanteric fracture of left femur, subsequent encounter for closed fracture with routine healing: Secondary | ICD-10-CM | POA: Diagnosis not present

## 2018-07-24 DIAGNOSIS — G4700 Insomnia, unspecified: Secondary | ICD-10-CM | POA: Diagnosis not present

## 2018-07-24 DIAGNOSIS — K5790 Diverticulosis of intestine, part unspecified, without perforation or abscess without bleeding: Secondary | ICD-10-CM | POA: Diagnosis not present

## 2018-07-24 DIAGNOSIS — K219 Gastro-esophageal reflux disease without esophagitis: Secondary | ICD-10-CM | POA: Diagnosis not present

## 2018-07-24 DIAGNOSIS — D509 Iron deficiency anemia, unspecified: Secondary | ICD-10-CM | POA: Diagnosis not present

## 2018-07-24 DIAGNOSIS — Z7901 Long term (current) use of anticoagulants: Secondary | ICD-10-CM | POA: Diagnosis not present

## 2018-07-24 DIAGNOSIS — E1159 Type 2 diabetes mellitus with other circulatory complications: Secondary | ICD-10-CM | POA: Diagnosis not present

## 2018-07-24 DIAGNOSIS — Z85828 Personal history of other malignant neoplasm of skin: Secondary | ICD-10-CM | POA: Diagnosis not present

## 2018-07-24 DIAGNOSIS — I351 Nonrheumatic aortic (valve) insufficiency: Secondary | ICD-10-CM | POA: Diagnosis not present

## 2018-07-24 DIAGNOSIS — E1122 Type 2 diabetes mellitus with diabetic chronic kidney disease: Secondary | ICD-10-CM | POA: Diagnosis not present

## 2018-07-24 DIAGNOSIS — E785 Hyperlipidemia, unspecified: Secondary | ICD-10-CM | POA: Diagnosis not present

## 2018-07-24 DIAGNOSIS — N183 Chronic kidney disease, stage 3 (moderate): Secondary | ICD-10-CM | POA: Diagnosis not present

## 2018-07-24 DIAGNOSIS — I429 Cardiomyopathy, unspecified: Secondary | ICD-10-CM | POA: Diagnosis not present

## 2018-07-24 DIAGNOSIS — Z96652 Presence of left artificial knee joint: Secondary | ICD-10-CM | POA: Diagnosis not present

## 2018-07-24 DIAGNOSIS — D631 Anemia in chronic kidney disease: Secondary | ICD-10-CM | POA: Diagnosis not present

## 2018-07-24 DIAGNOSIS — M8580 Other specified disorders of bone density and structure, unspecified site: Secondary | ICD-10-CM | POA: Diagnosis not present

## 2018-07-24 DIAGNOSIS — S72142D Displaced intertrochanteric fracture of left femur, subsequent encounter for closed fracture with routine healing: Secondary | ICD-10-CM | POA: Diagnosis not present

## 2018-07-24 DIAGNOSIS — M810 Age-related osteoporosis without current pathological fracture: Secondary | ICD-10-CM | POA: Diagnosis not present

## 2018-07-24 DIAGNOSIS — Z9181 History of falling: Secondary | ICD-10-CM | POA: Diagnosis not present

## 2018-07-24 DIAGNOSIS — I4821 Permanent atrial fibrillation: Secondary | ICD-10-CM | POA: Diagnosis not present

## 2018-07-24 DIAGNOSIS — I152 Hypertension secondary to endocrine disorders: Secondary | ICD-10-CM | POA: Diagnosis not present

## 2018-07-24 DIAGNOSIS — I5032 Chronic diastolic (congestive) heart failure: Secondary | ICD-10-CM | POA: Diagnosis not present

## 2018-07-26 ENCOUNTER — Other Ambulatory Visit: Payer: Self-pay | Admitting: Adult Health

## 2018-07-29 DIAGNOSIS — Z96642 Presence of left artificial hip joint: Secondary | ICD-10-CM | POA: Diagnosis not present

## 2018-07-30 DIAGNOSIS — Z85828 Personal history of other malignant neoplasm of skin: Secondary | ICD-10-CM | POA: Diagnosis not present

## 2018-07-30 DIAGNOSIS — N183 Chronic kidney disease, stage 3 (moderate): Secondary | ICD-10-CM | POA: Diagnosis not present

## 2018-07-30 DIAGNOSIS — K5790 Diverticulosis of intestine, part unspecified, without perforation or abscess without bleeding: Secondary | ICD-10-CM | POA: Diagnosis not present

## 2018-07-30 DIAGNOSIS — E1159 Type 2 diabetes mellitus with other circulatory complications: Secondary | ICD-10-CM | POA: Diagnosis not present

## 2018-07-30 DIAGNOSIS — K219 Gastro-esophageal reflux disease without esophagitis: Secondary | ICD-10-CM | POA: Diagnosis not present

## 2018-07-30 DIAGNOSIS — I429 Cardiomyopathy, unspecified: Secondary | ICD-10-CM | POA: Diagnosis not present

## 2018-07-30 DIAGNOSIS — M8580 Other specified disorders of bone density and structure, unspecified site: Secondary | ICD-10-CM | POA: Diagnosis not present

## 2018-07-30 DIAGNOSIS — I4821 Permanent atrial fibrillation: Secondary | ICD-10-CM | POA: Diagnosis not present

## 2018-07-30 DIAGNOSIS — S72142D Displaced intertrochanteric fracture of left femur, subsequent encounter for closed fracture with routine healing: Secondary | ICD-10-CM | POA: Diagnosis not present

## 2018-07-30 DIAGNOSIS — I5032 Chronic diastolic (congestive) heart failure: Secondary | ICD-10-CM | POA: Diagnosis not present

## 2018-07-30 DIAGNOSIS — G4700 Insomnia, unspecified: Secondary | ICD-10-CM | POA: Diagnosis not present

## 2018-07-30 DIAGNOSIS — M810 Age-related osteoporosis without current pathological fracture: Secondary | ICD-10-CM | POA: Diagnosis not present

## 2018-07-30 DIAGNOSIS — I152 Hypertension secondary to endocrine disorders: Secondary | ICD-10-CM | POA: Diagnosis not present

## 2018-07-30 DIAGNOSIS — I351 Nonrheumatic aortic (valve) insufficiency: Secondary | ICD-10-CM | POA: Diagnosis not present

## 2018-07-30 DIAGNOSIS — E1122 Type 2 diabetes mellitus with diabetic chronic kidney disease: Secondary | ICD-10-CM | POA: Diagnosis not present

## 2018-07-30 DIAGNOSIS — Z9181 History of falling: Secondary | ICD-10-CM | POA: Diagnosis not present

## 2018-07-30 DIAGNOSIS — D631 Anemia in chronic kidney disease: Secondary | ICD-10-CM | POA: Diagnosis not present

## 2018-07-30 DIAGNOSIS — Z7901 Long term (current) use of anticoagulants: Secondary | ICD-10-CM | POA: Diagnosis not present

## 2018-07-30 DIAGNOSIS — E785 Hyperlipidemia, unspecified: Secondary | ICD-10-CM | POA: Diagnosis not present

## 2018-07-30 DIAGNOSIS — D509 Iron deficiency anemia, unspecified: Secondary | ICD-10-CM | POA: Diagnosis not present

## 2018-07-30 DIAGNOSIS — Z96652 Presence of left artificial knee joint: Secondary | ICD-10-CM | POA: Diagnosis not present

## 2018-08-01 ENCOUNTER — Other Ambulatory Visit: Payer: Self-pay

## 2018-08-01 ENCOUNTER — Ambulatory Visit (INDEPENDENT_AMBULATORY_CARE_PROVIDER_SITE_OTHER): Payer: Medicare Other | Admitting: Family Medicine

## 2018-08-01 ENCOUNTER — Encounter: Payer: Self-pay | Admitting: Family Medicine

## 2018-08-01 DIAGNOSIS — I10 Essential (primary) hypertension: Secondary | ICD-10-CM

## 2018-08-01 DIAGNOSIS — M810 Age-related osteoporosis without current pathological fracture: Secondary | ICD-10-CM | POA: Diagnosis not present

## 2018-08-01 DIAGNOSIS — I152 Hypertension secondary to endocrine disorders: Secondary | ICD-10-CM | POA: Diagnosis not present

## 2018-08-01 DIAGNOSIS — Z96652 Presence of left artificial knee joint: Secondary | ICD-10-CM | POA: Diagnosis not present

## 2018-08-01 DIAGNOSIS — I480 Paroxysmal atrial fibrillation: Secondary | ICD-10-CM | POA: Diagnosis not present

## 2018-08-01 DIAGNOSIS — K219 Gastro-esophageal reflux disease without esophagitis: Secondary | ICD-10-CM | POA: Diagnosis not present

## 2018-08-01 DIAGNOSIS — K5790 Diverticulosis of intestine, part unspecified, without perforation or abscess without bleeding: Secondary | ICD-10-CM | POA: Diagnosis not present

## 2018-08-01 DIAGNOSIS — D509 Iron deficiency anemia, unspecified: Secondary | ICD-10-CM | POA: Diagnosis not present

## 2018-08-01 DIAGNOSIS — D631 Anemia in chronic kidney disease: Secondary | ICD-10-CM | POA: Diagnosis not present

## 2018-08-01 DIAGNOSIS — I5032 Chronic diastolic (congestive) heart failure: Secondary | ICD-10-CM | POA: Diagnosis not present

## 2018-08-01 DIAGNOSIS — I351 Nonrheumatic aortic (valve) insufficiency: Secondary | ICD-10-CM | POA: Diagnosis not present

## 2018-08-01 DIAGNOSIS — Z85828 Personal history of other malignant neoplasm of skin: Secondary | ICD-10-CM | POA: Diagnosis not present

## 2018-08-01 DIAGNOSIS — Z9181 History of falling: Secondary | ICD-10-CM | POA: Diagnosis not present

## 2018-08-01 DIAGNOSIS — S72142D Displaced intertrochanteric fracture of left femur, subsequent encounter for closed fracture with routine healing: Secondary | ICD-10-CM | POA: Diagnosis not present

## 2018-08-01 DIAGNOSIS — I429 Cardiomyopathy, unspecified: Secondary | ICD-10-CM | POA: Diagnosis not present

## 2018-08-01 DIAGNOSIS — E785 Hyperlipidemia, unspecified: Secondary | ICD-10-CM | POA: Diagnosis not present

## 2018-08-01 DIAGNOSIS — M8580 Other specified disorders of bone density and structure, unspecified site: Secondary | ICD-10-CM | POA: Diagnosis not present

## 2018-08-01 DIAGNOSIS — E1122 Type 2 diabetes mellitus with diabetic chronic kidney disease: Secondary | ICD-10-CM | POA: Diagnosis not present

## 2018-08-01 DIAGNOSIS — Z7901 Long term (current) use of anticoagulants: Secondary | ICD-10-CM | POA: Diagnosis not present

## 2018-08-01 DIAGNOSIS — N183 Chronic kidney disease, stage 3 (moderate): Secondary | ICD-10-CM | POA: Diagnosis not present

## 2018-08-01 DIAGNOSIS — E1159 Type 2 diabetes mellitus with other circulatory complications: Secondary | ICD-10-CM | POA: Diagnosis not present

## 2018-08-01 DIAGNOSIS — G4700 Insomnia, unspecified: Secondary | ICD-10-CM | POA: Diagnosis not present

## 2018-08-01 DIAGNOSIS — I4821 Permanent atrial fibrillation: Secondary | ICD-10-CM | POA: Diagnosis not present

## 2018-08-01 NOTE — Assessment & Plan Note (Signed)
For blood pressure generally getting too low will decrease diltiazem to 180.  And observe blood pressure

## 2018-08-01 NOTE — Progress Notes (Signed)
BP 111/79   Pulse (!) 130   Temp 98.4 F (36.9 C) (Oral)   Ht 5\' 1"  (1.549 m)   Wt 176 lb (79.8 kg)   SpO2 95%   BMI 33.25 kg/m    Subjective:    Patient ID: Natalie Gardner, female    DOB: August 17, 1945, 73 y.o.   MRN: [id number]  HPI: Natalie Gardner is a 73 y.o. female  Chief Complaint  Patient presents with  . Hypertension    f/u pt has not hav a recent diabetic eye exam  Patient follow-up assist with history from caregiver also in review of notes from cardiology. Patient with hypertension and atrial fibrillation.  Was treated from cardiology in February for bradycardia secondary to too much beta-blocker 50 mg of metoprolol and hypotension. Beta-blocker was stopped diltiazem was increased from 180 to 240. Patient is tolerating these medications but is noted blood pressure is remained okay but pulse has been markedly elevated. Had noted some slight tremors as a result of this.  And has had [place] [facility] come out for a house call with noted elevated pulse  Relevant past medical, surgical, family and social history reviewed and updated as indicated. Interim medical history since our last visit reviewed. Allergies and medications reviewed and updated.  Review of Systems  Constitutional: Negative.   Respiratory: Negative.   Cardiovascular: Negative.     Per HPI unless specifically indicated above     Objective:    BP 111/79   Pulse (!) 130   Temp 98.4 F (36.9 C) (Oral)   Ht 5\' 1"  (1.549 m)   Wt 176 lb (79.8 kg)   SpO2 95%   BMI 33.25 kg/m   Wt Readings from Last 3 Encounters:  08/01/18 176 lb (79.8 kg)  07/03/18 164 lb (74.4 kg)  06/25/18 168 lb (76.2 kg)    Physical Exam Constitutional:      Appearance: She is well-developed.  HENT:     Head: Normocephalic and atraumatic.  Eyes:     Conjunctiva/sclera: Conjunctivae normal.  Neck:     Musculoskeletal: Normal range of motion.  Cardiovascular:     Rate and Rhythm: Regular rhythm. Tachycardia present.      Heart sounds: Normal heart sounds.  Pulmonary:     Effort: Pulmonary effort is normal.     Breath sounds: Normal breath sounds.  Musculoskeletal: Normal range of motion.  Skin:    Findings: No erythema.  Neurological:     Mental Status: She is alert and oriented to person, place, and time.  Psychiatric:        Behavior: Behavior normal.        Thought Content: Thought content normal.        Judgment: Judgment normal.     Results for orders placed or performed in visit on 07/03/18  Bayer DCA Hb A1c Waived  Result Value Ref Range   HB A1C (BAYER DCA - WAIVED) 5.1 <7.0 %  LP+ALT+AST Piccolo, Waived  Result Value Ref Range   ALT (SGPT) Piccolo, Waived 24 10 - 47 U/L   AST (SGOT) Piccolo, Waived 27 11 - 38 U/L   Cholesterol Piccolo, Waived 156 <200 mg/dL   HDL Chol Piccolo, Waived 65 >59 mg/dL   Triglycerides Piccolo,Waived 143 <150 mg/dL   Chol/HDL Ratio Piccolo,Waive 2.4 mg/dL   LDL Chol Calc Piccolo Waived 62 <100 mg/dL   VLDL Chol Calc Piccolo,Waive 29 <30 mg/dL  Basic metabolic panel  Result Value Ref Range   Glucose  134 (H) 65 - 99 mg/dL   BUN 17 8 - 27 mg/dL   Creatinine, Ser 0.99 0.57 - 1.00 mg/dL   GFR calc non Af Amer 57 (L) >59 mL/min/1.73   GFR calc Af Amer 65 >59 mL/min/1.73   BUN/Creatinine Ratio 17 12 - 28   Sodium 133 (L) 134 - 144 mmol/L   Potassium 4.9 3.5 - 5.2 mmol/L   Chloride 97 96 - 106 mmol/L   CO2 19 (L) 20 - 29 mmol/L   Calcium 9.5 8.7 - 10.3 mg/dL      Assessment & Plan:   Problem List Items Addressed This Visit      Cardiovascular and Mediastinum   Essential hypertension    For blood pressure generally getting too low will decrease diltiazem to 180.  And observe blood pressure      Atrial fibrillation ([facility])    Discuss atrial fibrillation and rapid rate noted will restart metoprolol at 50 mg half a tablet a day for 25 mg and observe pulse rate.          Follow up plan: Return in about 4 weeks (around 08/29/2018).

## 2018-08-01 NOTE — Assessment & Plan Note (Signed)
Discuss atrial fibrillation and rapid rate noted will restart metoprolol at 50 mg half a tablet a day for 25 mg and observe pulse rate.

## 2018-08-02 DIAGNOSIS — B351 Tinea unguium: Secondary | ICD-10-CM | POA: Diagnosis not present

## 2018-08-02 DIAGNOSIS — M79675 Pain in left toe(s): Secondary | ICD-10-CM | POA: Diagnosis not present

## 2018-08-02 DIAGNOSIS — M79674 Pain in right toe(s): Secondary | ICD-10-CM | POA: Diagnosis not present

## 2018-08-02 DIAGNOSIS — L600 Ingrowing nail: Secondary | ICD-10-CM | POA: Diagnosis not present

## 2018-08-02 DIAGNOSIS — M2141 Flat foot [pes planus] (acquired), right foot: Secondary | ICD-10-CM | POA: Diagnosis not present

## 2018-08-05 ENCOUNTER — Other Ambulatory Visit: Payer: Self-pay | Admitting: Family Medicine

## 2018-08-05 DIAGNOSIS — D631 Anemia in chronic kidney disease: Secondary | ICD-10-CM | POA: Diagnosis not present

## 2018-08-05 DIAGNOSIS — S72142D Displaced intertrochanteric fracture of left femur, subsequent encounter for closed fracture with routine healing: Secondary | ICD-10-CM | POA: Diagnosis not present

## 2018-08-05 DIAGNOSIS — K219 Gastro-esophageal reflux disease without esophagitis: Secondary | ICD-10-CM | POA: Diagnosis not present

## 2018-08-05 DIAGNOSIS — E785 Hyperlipidemia, unspecified: Secondary | ICD-10-CM | POA: Diagnosis not present

## 2018-08-05 DIAGNOSIS — E1159 Type 2 diabetes mellitus with other circulatory complications: Secondary | ICD-10-CM | POA: Diagnosis not present

## 2018-08-05 DIAGNOSIS — D509 Iron deficiency anemia, unspecified: Secondary | ICD-10-CM | POA: Diagnosis not present

## 2018-08-05 DIAGNOSIS — M8580 Other specified disorders of bone density and structure, unspecified site: Secondary | ICD-10-CM | POA: Diagnosis not present

## 2018-08-05 DIAGNOSIS — I351 Nonrheumatic aortic (valve) insufficiency: Secondary | ICD-10-CM | POA: Diagnosis not present

## 2018-08-05 DIAGNOSIS — M810 Age-related osteoporosis without current pathological fracture: Secondary | ICD-10-CM | POA: Diagnosis not present

## 2018-08-05 DIAGNOSIS — I152 Hypertension secondary to endocrine disorders: Secondary | ICD-10-CM | POA: Diagnosis not present

## 2018-08-05 DIAGNOSIS — G4700 Insomnia, unspecified: Secondary | ICD-10-CM | POA: Diagnosis not present

## 2018-08-05 DIAGNOSIS — I4821 Permanent atrial fibrillation: Secondary | ICD-10-CM | POA: Diagnosis not present

## 2018-08-05 DIAGNOSIS — Z1231 Encounter for screening mammogram for malignant neoplasm of breast: Secondary | ICD-10-CM

## 2018-08-05 DIAGNOSIS — I5032 Chronic diastolic (congestive) heart failure: Secondary | ICD-10-CM | POA: Diagnosis not present

## 2018-08-05 DIAGNOSIS — Z9181 History of falling: Secondary | ICD-10-CM | POA: Diagnosis not present

## 2018-08-05 DIAGNOSIS — N183 Chronic kidney disease, stage 3 (moderate): Secondary | ICD-10-CM | POA: Diagnosis not present

## 2018-08-05 DIAGNOSIS — I429 Cardiomyopathy, unspecified: Secondary | ICD-10-CM | POA: Diagnosis not present

## 2018-08-05 DIAGNOSIS — E1122 Type 2 diabetes mellitus with diabetic chronic kidney disease: Secondary | ICD-10-CM | POA: Diagnosis not present

## 2018-08-05 DIAGNOSIS — K5790 Diverticulosis of intestine, part unspecified, without perforation or abscess without bleeding: Secondary | ICD-10-CM | POA: Diagnosis not present

## 2018-08-05 DIAGNOSIS — Z96652 Presence of left artificial knee joint: Secondary | ICD-10-CM | POA: Diagnosis not present

## 2018-08-05 DIAGNOSIS — Z7901 Long term (current) use of anticoagulants: Secondary | ICD-10-CM | POA: Diagnosis not present

## 2018-08-05 DIAGNOSIS — Z85828 Personal history of other malignant neoplasm of skin: Secondary | ICD-10-CM | POA: Diagnosis not present

## 2018-08-07 ENCOUNTER — Encounter: Payer: Self-pay | Admitting: Family Medicine

## 2018-08-08 DIAGNOSIS — I429 Cardiomyopathy, unspecified: Secondary | ICD-10-CM | POA: Diagnosis not present

## 2018-08-08 DIAGNOSIS — S72142D Displaced intertrochanteric fracture of left femur, subsequent encounter for closed fracture with routine healing: Secondary | ICD-10-CM | POA: Diagnosis not present

## 2018-08-08 DIAGNOSIS — D509 Iron deficiency anemia, unspecified: Secondary | ICD-10-CM | POA: Diagnosis not present

## 2018-08-08 DIAGNOSIS — I351 Nonrheumatic aortic (valve) insufficiency: Secondary | ICD-10-CM | POA: Diagnosis not present

## 2018-08-08 DIAGNOSIS — K219 Gastro-esophageal reflux disease without esophagitis: Secondary | ICD-10-CM | POA: Diagnosis not present

## 2018-08-08 DIAGNOSIS — Z9181 History of falling: Secondary | ICD-10-CM | POA: Diagnosis not present

## 2018-08-08 DIAGNOSIS — Z85828 Personal history of other malignant neoplasm of skin: Secondary | ICD-10-CM | POA: Diagnosis not present

## 2018-08-08 DIAGNOSIS — M8580 Other specified disorders of bone density and structure, unspecified site: Secondary | ICD-10-CM | POA: Diagnosis not present

## 2018-08-08 DIAGNOSIS — I4821 Permanent atrial fibrillation: Secondary | ICD-10-CM | POA: Diagnosis not present

## 2018-08-08 DIAGNOSIS — N183 Chronic kidney disease, stage 3 (moderate): Secondary | ICD-10-CM | POA: Diagnosis not present

## 2018-08-08 DIAGNOSIS — I152 Hypertension secondary to endocrine disorders: Secondary | ICD-10-CM | POA: Diagnosis not present

## 2018-08-08 DIAGNOSIS — G4700 Insomnia, unspecified: Secondary | ICD-10-CM | POA: Diagnosis not present

## 2018-08-08 DIAGNOSIS — M810 Age-related osteoporosis without current pathological fracture: Secondary | ICD-10-CM | POA: Diagnosis not present

## 2018-08-08 DIAGNOSIS — E1122 Type 2 diabetes mellitus with diabetic chronic kidney disease: Secondary | ICD-10-CM | POA: Diagnosis not present

## 2018-08-08 DIAGNOSIS — Z7901 Long term (current) use of anticoagulants: Secondary | ICD-10-CM | POA: Diagnosis not present

## 2018-08-08 DIAGNOSIS — E785 Hyperlipidemia, unspecified: Secondary | ICD-10-CM | POA: Diagnosis not present

## 2018-08-08 DIAGNOSIS — E1159 Type 2 diabetes mellitus with other circulatory complications: Secondary | ICD-10-CM | POA: Diagnosis not present

## 2018-08-08 DIAGNOSIS — D631 Anemia in chronic kidney disease: Secondary | ICD-10-CM | POA: Diagnosis not present

## 2018-08-08 DIAGNOSIS — I5032 Chronic diastolic (congestive) heart failure: Secondary | ICD-10-CM | POA: Diagnosis not present

## 2018-08-08 DIAGNOSIS — K5790 Diverticulosis of intestine, part unspecified, without perforation or abscess without bleeding: Secondary | ICD-10-CM | POA: Diagnosis not present

## 2018-08-08 DIAGNOSIS — Z96652 Presence of left artificial knee joint: Secondary | ICD-10-CM | POA: Diagnosis not present

## 2018-08-12 DIAGNOSIS — Z7901 Long term (current) use of anticoagulants: Secondary | ICD-10-CM | POA: Diagnosis not present

## 2018-08-13 ENCOUNTER — Ambulatory Visit
Admission: RE | Admit: 2018-08-13 | Discharge: 2018-08-13 | Disposition: A | Discharge status: Home / Self Care | Payer: Medicare Other | Source: Ambulatory Visit | Attending: Family Medicine | Admitting: Family Medicine

## 2018-08-13 ENCOUNTER — Other Ambulatory Visit: Payer: Self-pay

## 2018-08-13 DIAGNOSIS — Z1231 Encounter for screening mammogram for malignant neoplasm of breast: Secondary | ICD-10-CM

## 2018-08-24 ENCOUNTER — Other Ambulatory Visit: Payer: Self-pay | Admitting: Adult Health

## 2018-08-26 DIAGNOSIS — R791 Abnormal coagulation profile: Secondary | ICD-10-CM | POA: Diagnosis not present

## 2018-08-29 ENCOUNTER — Ambulatory Visit: Payer: Medicare Other | Admitting: Family Medicine

## 2018-09-03 ENCOUNTER — Encounter: Payer: Self-pay | Admitting: Family Medicine

## 2018-09-03 ENCOUNTER — Other Ambulatory Visit: Payer: Self-pay

## 2018-09-03 ENCOUNTER — Ambulatory Visit (INDEPENDENT_AMBULATORY_CARE_PROVIDER_SITE_OTHER): Payer: Medicare Other | Admitting: Family Medicine

## 2018-09-03 DIAGNOSIS — D649 Anemia, unspecified: Secondary | ICD-10-CM

## 2018-09-03 DIAGNOSIS — I480 Paroxysmal atrial fibrillation: Secondary | ICD-10-CM

## 2018-09-03 NOTE — Assessment & Plan Note (Signed)
The current medical regimen is effective;  continue present plan and medications.  

## 2018-09-03 NOTE — Assessment & Plan Note (Signed)
Patient with history of anemia which was felt to be secondary to surgery now showing stool for occult blood which is positive. Patient without symptoms from anemia. Because of COVID-19 risk will not check CBC at this time but will set up an appointment with GI for future evaluation.  This is because of stool for occult blood which is positive. Check CBC as soon as safe to do so.

## 2018-09-03 NOTE — Progress Notes (Signed)
BP 110/69 (BP Location: Left Arm, Patient Position: Sitting, Cuff Size: Normal)   Pulse 84   Ht 5\' 4"  (1.626 m)   Wt 180 lb (81.6 kg)   BMI 30.90 kg/m    Subjective:    Patient ID: Natalie Gardner, female    DOB: Jul 01, 1945, 73 y.o.   MRN: [id number]  HPI: Natalie Gardner is a 73 y.o. female  Chief Complaint  Patient presents with  . Results    [name] did FIT test for patient and the test was positive for blood in stool.   . Hypertension    4 week F/U  . Paroxysmal Atrial Fibrillation  Telemedicine using audio/video telecommunications for a synchronous communication visit. Today's visit due to COVID-19 isolation precautions I connected with and verified that I am speaking with the correct person using two identifiers.   I discussed the limitations, risks, security and privacy concerns of performing an evaluation and management service by telecommunication and the availability of in person appointments. I also discussed with the patient that there may be a patient responsible charge related to this service. The patient expressed understanding and agreed to proceed. The patient's location is home. I am at home.  Discussed with patient took home stool for occult blood testing which turned out to be positive. Patient was anemic in January which was felt to be secondary to hip surgery.  Which showed her blood count improving markedly after surgery. Patient with no symptoms of anemia but stool for occult blood test is positive. Patient otherwise taken COVID-19 precautions seriously and practicing social distancing. Blood pressure and tachycardia are doing well with metoprolol dosing.  Relevant past medical, surgical, family and social history reviewed and updated as indicated. Interim medical history since our last visit reviewed. Allergies and medications reviewed and updated.  Review of Systems  Constitutional: Negative.   Respiratory: Negative.    Cardiovascular: Negative.     Per HPI unless specifically indicated above     Objective:    BP 110/69 (BP Location: Left Arm, Patient Position: Sitting, Cuff Size: Normal)   Pulse 84   Ht 5\' 4"  (1.626 m)   Wt 180 lb (81.6 kg)   BMI 30.90 kg/m   Wt Readings from Last 3 Encounters:  09/03/18 180 lb (81.6 kg)  08/01/18 176 lb (79.8 kg)  07/03/18 164 lb (74.4 kg)    Physical Exam  Results for orders placed or performed in visit on 07/03/18  Bayer DCA Hb A1c Waived  Result Value Ref Range   HB A1C (BAYER DCA - WAIVED) 5.1 <7.0 %  LP+ALT+AST Piccolo, Waived  Result Value Ref Range   ALT (SGPT) Piccolo, Waived 24 10 - 47 U/L   AST (SGOT) Piccolo, Waived 27 11 - 38 U/L   Cholesterol Piccolo, Waived 156 <200 mg/dL   HDL Chol Piccolo, Waived 65 >59 mg/dL   Triglycerides Piccolo,Waived 143 <150 mg/dL   Chol/HDL Ratio Piccolo,Waive 2.4 mg/dL   LDL Chol Calc Piccolo Waived 62 <100 mg/dL   VLDL Chol Calc Piccolo,Waive 29 <30 mg/dL  Basic metabolic panel  Result Value Ref Range   Glucose 134 (H) 65 - 99 mg/dL   BUN 17 8 - 27 mg/dL   Creatinine, Ser 0.99 0.57 - 1.00 mg/dL   GFR calc non Af Amer 57 (L) >59 mL/min/1.73   GFR calc Af Amer 65 >59 mL/min/1.73   BUN/Creatinine Ratio 17 12 - 28   Sodium 133 (L) 134 - 144 mmol/L  Potassium 4.9 3.5 - 5.2 mmol/L   Chloride 97 96 - 106 mmol/L   CO2 19 (L) 20 - 29 mmol/L   Calcium 9.5 8.7 - 10.3 mg/dL      Assessment & Plan:   Problem List Items Addressed This Visit      Cardiovascular and Mediastinum   Atrial fibrillation ([place])    The current medical regimen is effective;  continue present plan and medications.       Relevant Medications   furosemide (LASIX) 20 MG tablet     Other   Anemia    Patient with history of anemia which was felt to be secondary to surgery now showing stool for occult blood which is positive. Patient without symptoms from anemia. Because of COVID-19 risk will not check CBC at this time but will set  up an appointment with GI for future evaluation.  This is because of stool for occult blood which is positive. Check CBC as soon as safe to do so.      Relevant Orders   Ambulatory referral to Gastroenterology      I discussed the assessment and treatment plan with the patient. The patient was provided an opportunity to ask questions and all were answered. The patient agreed with the plan and demonstrated an understanding of the instructions.   The patient was advised to call back or seek an in-person evaluation if the symptoms worsen or if the condition fails to improve as anticipated.   I provided 21+ minutes of time during this encounter. Follow up plan: Return if symptoms worsen or fail to improve, for As scheduled.

## 2018-09-05 ENCOUNTER — Encounter: Payer: Self-pay | Admitting: *Deleted

## 2018-10-17 ENCOUNTER — Inpatient Hospital Stay (HOSPITAL_COMMUNITY): Payer: Medicare Other | Admitting: Anesthesiology

## 2018-10-17 ENCOUNTER — Encounter (HOSPITAL_COMMUNITY): Payer: Self-pay | Admitting: Certified Registered"

## 2018-10-17 ENCOUNTER — Emergency Department (HOSPITAL_COMMUNITY): Payer: Medicare Other

## 2018-10-17 ENCOUNTER — Inpatient Hospital Stay (HOSPITAL_COMMUNITY): Payer: Medicare Other

## 2018-10-17 ENCOUNTER — Inpatient Hospital Stay (HOSPITAL_COMMUNITY)
Admission: EM | Admit: 2018-10-17 | Discharge: 2018-10-29 | DRG: 023 | Disposition: A | Discharge status: Hospice | Payer: Medicare Other | Attending: Neurology | Admitting: Neurology

## 2018-10-17 ENCOUNTER — Encounter (HOSPITAL_COMMUNITY)
Admission: EM | Disposition: A | Discharge status: Hospice | Payer: Self-pay | Source: Home / Self Care | Attending: Neurology

## 2018-10-17 DIAGNOSIS — R410 Disorientation, unspecified: Secondary | ICD-10-CM | POA: Diagnosis not present

## 2018-10-17 DIAGNOSIS — R4781 Slurred speech: Secondary | ICD-10-CM | POA: Diagnosis not present

## 2018-10-17 DIAGNOSIS — I6602 Occlusion and stenosis of left middle cerebral artery: Secondary | ICD-10-CM | POA: Diagnosis present

## 2018-10-17 DIAGNOSIS — I482 Chronic atrial fibrillation, unspecified: Secondary | ICD-10-CM | POA: Diagnosis not present

## 2018-10-17 DIAGNOSIS — I4891 Unspecified atrial fibrillation: Secondary | ICD-10-CM | POA: Diagnosis not present

## 2018-10-17 DIAGNOSIS — I130 Hypertensive heart and chronic kidney disease with heart failure and stage 1 through stage 4 chronic kidney disease, or unspecified chronic kidney disease: Secondary | ICD-10-CM | POA: Diagnosis not present

## 2018-10-17 DIAGNOSIS — E46 Unspecified protein-calorie malnutrition: Secondary | ICD-10-CM | POA: Diagnosis not present

## 2018-10-17 DIAGNOSIS — D649 Anemia, unspecified: Secondary | ICD-10-CM | POA: Diagnosis not present

## 2018-10-17 DIAGNOSIS — I10 Essential (primary) hypertension: Secondary | ICD-10-CM | POA: Diagnosis not present

## 2018-10-17 DIAGNOSIS — R791 Abnormal coagulation profile: Secondary | ICD-10-CM | POA: Diagnosis present

## 2018-10-17 DIAGNOSIS — Z96652 Presence of left artificial knee joint: Secondary | ICD-10-CM | POA: Diagnosis present

## 2018-10-17 DIAGNOSIS — R29707 NIHSS score 7: Secondary | ICD-10-CM | POA: Diagnosis present

## 2018-10-17 DIAGNOSIS — Z7401 Bed confinement status: Secondary | ICD-10-CM | POA: Diagnosis not present

## 2018-10-17 DIAGNOSIS — D62 Acute posthemorrhagic anemia: Secondary | ICD-10-CM | POA: Diagnosis not present

## 2018-10-17 DIAGNOSIS — J90 Pleural effusion, not elsewhere classified: Secondary | ICD-10-CM | POA: Diagnosis not present

## 2018-10-17 DIAGNOSIS — I63412 Cerebral infarction due to embolism of left middle cerebral artery: Principal | ICD-10-CM | POA: Diagnosis present

## 2018-10-17 DIAGNOSIS — Z833 Family history of diabetes mellitus: Secondary | ICD-10-CM

## 2018-10-17 DIAGNOSIS — G8191 Hemiplegia, unspecified affecting right dominant side: Secondary | ICD-10-CM | POA: Diagnosis present

## 2018-10-17 DIAGNOSIS — J690 Pneumonitis due to inhalation of food and vomit: Secondary | ICD-10-CM | POA: Diagnosis not present

## 2018-10-17 DIAGNOSIS — R0902 Hypoxemia: Secondary | ICD-10-CM

## 2018-10-17 DIAGNOSIS — I63349 Cerebral infarction due to thrombosis of unspecified cerebellar artery: Secondary | ICD-10-CM | POA: Diagnosis not present

## 2018-10-17 DIAGNOSIS — R1312 Dysphagia, oropharyngeal phase: Secondary | ICD-10-CM | POA: Diagnosis not present

## 2018-10-17 DIAGNOSIS — Z8249 Family history of ischemic heart disease and other diseases of the circulatory system: Secondary | ICD-10-CM

## 2018-10-17 DIAGNOSIS — I420 Dilated cardiomyopathy: Secondary | ICD-10-CM | POA: Diagnosis not present

## 2018-10-17 DIAGNOSIS — K219 Gastro-esophageal reflux disease without esophagitis: Secondary | ICD-10-CM | POA: Diagnosis present

## 2018-10-17 DIAGNOSIS — R29810 Facial weakness: Secondary | ICD-10-CM | POA: Diagnosis not present

## 2018-10-17 DIAGNOSIS — I5033 Acute on chronic diastolic (congestive) heart failure: Secondary | ICD-10-CM | POA: Diagnosis not present

## 2018-10-17 DIAGNOSIS — I63512 Cerebral infarction due to unspecified occlusion or stenosis of left middle cerebral artery: Secondary | ICD-10-CM | POA: Diagnosis not present

## 2018-10-17 DIAGNOSIS — T45516A Underdosing of anticoagulants, initial encounter: Secondary | ICD-10-CM | POA: Diagnosis present

## 2018-10-17 DIAGNOSIS — J9600 Acute respiratory failure, unspecified whether with hypoxia or hypercapnia: Secondary | ICD-10-CM | POA: Diagnosis not present

## 2018-10-17 DIAGNOSIS — I152 Hypertension secondary to endocrine disorders: Secondary | ICD-10-CM | POA: Diagnosis present

## 2018-10-17 DIAGNOSIS — Z820 Family history of epilepsy and other diseases of the nervous system: Secondary | ICD-10-CM

## 2018-10-17 DIAGNOSIS — I351 Nonrheumatic aortic (valve) insufficiency: Secondary | ICD-10-CM | POA: Diagnosis not present

## 2018-10-17 DIAGNOSIS — E7849 Other hyperlipidemia: Secondary | ICD-10-CM | POA: Diagnosis not present

## 2018-10-17 DIAGNOSIS — Z91138 Patient's unintentional underdosing of medication regimen for other reason: Secondary | ICD-10-CM

## 2018-10-17 DIAGNOSIS — I609 Nontraumatic subarachnoid hemorrhage, unspecified: Secondary | ICD-10-CM | POA: Diagnosis not present

## 2018-10-17 DIAGNOSIS — E1122 Type 2 diabetes mellitus with diabetic chronic kidney disease: Secondary | ICD-10-CM | POA: Diagnosis present

## 2018-10-17 DIAGNOSIS — I6012 Nontraumatic subarachnoid hemorrhage from left middle cerebral artery: Secondary | ICD-10-CM | POA: Diagnosis not present

## 2018-10-17 DIAGNOSIS — Z6830 Body mass index (BMI) 30.0-30.9, adult: Secondary | ICD-10-CM | POA: Diagnosis not present

## 2018-10-17 DIAGNOSIS — J811 Chronic pulmonary edema: Secondary | ICD-10-CM | POA: Diagnosis not present

## 2018-10-17 DIAGNOSIS — E669 Obesity, unspecified: Secondary | ICD-10-CM | POA: Diagnosis present

## 2018-10-17 DIAGNOSIS — R4701 Aphasia: Secondary | ICD-10-CM | POA: Diagnosis present

## 2018-10-17 DIAGNOSIS — M2550 Pain in unspecified joint: Secondary | ICD-10-CM | POA: Diagnosis not present

## 2018-10-17 DIAGNOSIS — E876 Hypokalemia: Secondary | ICD-10-CM | POA: Diagnosis not present

## 2018-10-17 DIAGNOSIS — Z930 Tracheostomy status: Secondary | ICD-10-CM | POA: Diagnosis not present

## 2018-10-17 DIAGNOSIS — E1169 Type 2 diabetes mellitus with other specified complication: Secondary | ICD-10-CM | POA: Diagnosis present

## 2018-10-17 DIAGNOSIS — I5020 Unspecified systolic (congestive) heart failure: Secondary | ICD-10-CM

## 2018-10-17 DIAGNOSIS — I361 Nonrheumatic tricuspid (valve) insufficiency: Secondary | ICD-10-CM | POA: Diagnosis not present

## 2018-10-17 DIAGNOSIS — Z1159 Encounter for screening for other viral diseases: Secondary | ICD-10-CM | POA: Diagnosis not present

## 2018-10-17 DIAGNOSIS — Z09 Encounter for follow-up examination after completed treatment for conditions other than malignant neoplasm: Secondary | ICD-10-CM

## 2018-10-17 DIAGNOSIS — J189 Pneumonia, unspecified organism: Secondary | ICD-10-CM

## 2018-10-17 DIAGNOSIS — I639 Cerebral infarction, unspecified: Secondary | ICD-10-CM | POA: Diagnosis not present

## 2018-10-17 DIAGNOSIS — N189 Chronic kidney disease, unspecified: Secondary | ICD-10-CM | POA: Diagnosis not present

## 2018-10-17 DIAGNOSIS — R000 Tachycardia, unspecified: Secondary | ICD-10-CM | POA: Diagnosis not present

## 2018-10-17 DIAGNOSIS — E785 Hyperlipidemia, unspecified: Secondary | ICD-10-CM | POA: Diagnosis not present

## 2018-10-17 DIAGNOSIS — E1159 Type 2 diabetes mellitus with other circulatory complications: Secondary | ICD-10-CM | POA: Diagnosis present

## 2018-10-17 DIAGNOSIS — Z515 Encounter for palliative care: Secondary | ICD-10-CM | POA: Diagnosis not present

## 2018-10-17 DIAGNOSIS — G459 Transient cerebral ischemic attack, unspecified: Secondary | ICD-10-CM | POA: Diagnosis not present

## 2018-10-17 DIAGNOSIS — R0989 Other specified symptoms and signs involving the circulatory and respiratory systems: Secondary | ICD-10-CM | POA: Diagnosis not present

## 2018-10-17 DIAGNOSIS — I499 Cardiac arrhythmia, unspecified: Secondary | ICD-10-CM | POA: Diagnosis not present

## 2018-10-17 DIAGNOSIS — N179 Acute kidney failure, unspecified: Secondary | ICD-10-CM | POA: Diagnosis not present

## 2018-10-17 DIAGNOSIS — Z66 Do not resuscitate: Secondary | ICD-10-CM | POA: Diagnosis not present

## 2018-10-17 DIAGNOSIS — Z978 Presence of other specified devices: Secondary | ICD-10-CM

## 2018-10-17 HISTORY — PX: RADIOLOGY WITH ANESTHESIA: SHX6223

## 2018-10-17 LAB — CBC
HCT: 38.8 % (ref 36.0–46.0)
Hemoglobin: 12 g/dL (ref 12.0–15.0)
MCH: 29.6 pg (ref 26.0–34.0)
MCHC: 30.9 g/dL (ref 30.0–36.0)
MCV: 95.6 fL (ref 80.0–100.0)
Platelets: 281 10*3/uL (ref 150–400)
RBC: 4.06 MIL/uL (ref 3.87–5.11)
RDW: 15.9 % — ABNORMAL HIGH (ref 11.5–15.5)
WBC: 7.9 10*3/uL (ref 4.0–10.5)
nRBC: 0 % (ref 0.0–0.2)

## 2018-10-17 LAB — DIFFERENTIAL
Abs Immature Granulocytes: 0.03 10*3/uL (ref 0.00–0.07)
Basophils Absolute: 0 10*3/uL (ref 0.0–0.1)
Basophils Relative: 0 %
Eosinophils Absolute: 0.2 10*3/uL (ref 0.0–0.5)
Eosinophils Relative: 2 %
Immature Granulocytes: 0 %
Lymphocytes Relative: 17 %
Lymphs Abs: 1.3 10*3/uL (ref 0.7–4.0)
Monocytes Absolute: 0.4 10*3/uL (ref 0.1–1.0)
Monocytes Relative: 5 %
Neutro Abs: 6 10*3/uL (ref 1.7–7.7)
Neutrophils Relative %: 76 %

## 2018-10-17 LAB — I-STAT CHEM 8, ED
BUN: 15 mg/dL (ref 8–23)
Calcium, Ion: 1.09 mmol/L — ABNORMAL LOW (ref 1.15–1.40)
Chloride: 105 mmol/L (ref 98–111)
Creatinine, Ser: 0.8 mg/dL (ref 0.44–1.00)
Glucose, Bld: 120 mg/dL — ABNORMAL HIGH (ref 70–99)
HCT: 39 % (ref 36.0–46.0)
Hemoglobin: 13.3 g/dL (ref 12.0–15.0)
Potassium: 4.1 mmol/L (ref 3.5–5.1)
Sodium: 136 mmol/L (ref 135–145)
TCO2: 21 mmol/L — ABNORMAL LOW (ref 22–32)

## 2018-10-17 LAB — COMPREHENSIVE METABOLIC PANEL
ALT: 19 U/L (ref 0–44)
AST: 22 U/L (ref 15–41)
Albumin: 3.4 g/dL — ABNORMAL LOW (ref 3.5–5.0)
Alkaline Phosphatase: 86 U/L (ref 38–126)
Anion gap: 10 (ref 5–15)
BUN: 14 mg/dL (ref 8–23)
CO2: 21 mmol/L — ABNORMAL LOW (ref 22–32)
Calcium: 8.9 mg/dL (ref 8.9–10.3)
Chloride: 104 mmol/L (ref 98–111)
Creatinine, Ser: 0.88 mg/dL (ref 0.44–1.00)
GFR calc Af Amer: >60 mL/min (ref >60)
GFR calc non Af Amer: >60 mL/min (ref >60)
Glucose, Bld: 123 mg/dL — ABNORMAL HIGH (ref 70–99)
Potassium: 4.1 mmol/L (ref 3.5–5.1)
Sodium: 135 mmol/L (ref 135–145)
Total Bilirubin: 0.5 mg/dL (ref 0.3–1.2)
Total Protein: 6.9 g/dL (ref 6.5–8.1)

## 2018-10-17 LAB — PROTIME-INR
INR: 1.2 (ref 0.8–1.2)
Prothrombin Time: 15.3 seconds — ABNORMAL HIGH (ref 11.4–15.2)

## 2018-10-17 LAB — SARS CORONAVIRUS 2 BY RT PCR (HOSPITAL ORDER, PERFORMED IN ~~LOC~~ HOSPITAL LAB): SARS Coronavirus 2: NEGATIVE

## 2018-10-17 LAB — APTT: aPTT: 27 seconds (ref 24–36)

## 2018-10-17 LAB — CBG MONITORING, ED: Glucose-Capillary: 125 mg/dL — ABNORMAL HIGH (ref 70–99)

## 2018-10-17 SURGERY — IR WITH ANESTHESIA
Anesthesia: General

## 2018-10-17 MED ORDER — ROCURONIUM BROMIDE 50 MG/5ML IV SOLN
INTRAVENOUS | Status: DC | PRN
  Administered 2018-10-17: 50 mg via INTRAVENOUS

## 2018-10-17 MED ORDER — CLEVIDIPINE BUTYRATE 0.5 MG/ML IV EMUL: 0.0000 mg/h | INTRAVENOUS | Status: DC

## 2018-10-17 MED ORDER — VANCOMYCIN HCL 1000 MG IV SOLR
INTRAVENOUS | Status: DC | PRN
  Administered 2018-10-17: 1000 mg via INTRAVENOUS

## 2018-10-17 MED ORDER — FENTANYL CITRATE (PF) 100 MCG/2ML IJ SOLN
INTRAMUSCULAR | Status: AC
  Filled 2018-10-17: qty 2

## 2018-10-17 MED ORDER — STROKE: EARLY STAGES OF RECOVERY BOOK
Freq: Once | Status: DC
  Filled 2018-10-17: qty 1

## 2018-10-17 MED ORDER — SODIUM CHLORIDE 0.9 % IV SOLN
INTRAVENOUS | Status: DC | PRN
  Administered 2018-10-17 – 2018-10-18 (×2): via INTRAVENOUS

## 2018-10-17 MED ORDER — LABETALOL HCL 5 MG/ML IV SOLN
INTRAVENOUS | Status: AC
  Administered 2018-10-17: 11:00:00
  Filled 2018-10-17: qty 4

## 2018-10-17 MED ORDER — EPTIFIBATIDE 20 MG/10ML IV SOLN
INTRAVENOUS | Status: AC
  Filled 2018-10-17: qty 10

## 2018-10-17 MED ORDER — TIROFIBAN HCL IN NACL 5-0.9 MG/100ML-% IV SOLN
INTRAVENOUS | Status: AC
  Filled 2018-10-17: qty 100

## 2018-10-17 MED ORDER — VANCOMYCIN HCL IN DEXTROSE 1-5 GM/200ML-% IV SOLN
INTRAVENOUS | Status: AC
  Filled 2018-10-17: qty 200

## 2018-10-17 MED ORDER — SODIUM CHLORIDE 0.9% FLUSH
3.0000 mL | Freq: Once | INTRAVENOUS | Status: DC
Start: 2018-10-17 — End: 2018-10-25

## 2018-10-17 MED ORDER — ACETAMINOPHEN 650 MG RE SUPP: 650.0000 mg | RECTAL | Status: DC | PRN

## 2018-10-17 MED ORDER — ALTEPLASE (STROKE) FULL DOSE INFUSION
0.9000 mg/kg | Freq: Once | INTRAVENOUS | Status: AC
  Administered 2018-10-17: 23:00:00 75.3 mg via INTRAVENOUS
  Filled 2018-10-17: qty 100

## 2018-10-17 MED ORDER — IOHEXOL 350 MG/ML SOLN
75.0000 mL | Freq: Once | INTRAVENOUS | Status: AC | PRN
  Administered 2018-10-17: 22:00:00 75 mL via INTRAVENOUS

## 2018-10-17 MED ORDER — SODIUM CHLORIDE 0.9 % IV SOLN
50.0000 mL | Freq: Once | INTRAVENOUS | Status: AC
  Administered 2018-10-18: 50 mL via INTRAVENOUS

## 2018-10-17 MED ORDER — SODIUM CHLORIDE 0.9 % IV SOLN: INTRAVENOUS | Status: DC

## 2018-10-17 MED ORDER — NITROGLYCERIN 1 MG/10 ML FOR IR/CATH LAB
INTRA_ARTERIAL | Status: AC
  Administered 2018-10-18: 02:00:00 50 ug via INTRA_ARTERIAL
  Administered 2018-10-18: 01:00:00 25 ug via INTRA_ARTERIAL
  Administered 2018-10-18: 50 ug via INTRA_ARTERIAL
  Filled 2018-10-17: qty 10

## 2018-10-17 MED ORDER — PHENYLEPHRINE HCL (PRESSORS) 10 MG/ML IV SOLN
INTRAVENOUS | Status: DC | PRN
  Administered 2018-10-17 – 2018-10-18 (×6): 80 ug via INTRAVENOUS

## 2018-10-17 MED ORDER — PROPOFOL 1000 MG/100ML IV EMUL
INTRAVENOUS | Status: DC | PRN
  Administered 2018-10-17: 20 ug/kg/min via INTRAVENOUS
  Administered 2018-10-18: 50 ug/kg/min via INTRAVENOUS

## 2018-10-17 MED ORDER — ACETAMINOPHEN 160 MG/5ML PO SOLN: 650.0000 mg | ORAL | Status: DC | PRN

## 2018-10-17 MED ORDER — TICAGRELOR 90 MG PO TABS
ORAL_TABLET | ORAL | Status: AC
  Filled 2018-10-17: qty 2

## 2018-10-17 MED ORDER — ASPIRIN 325 MG PO TABS
ORAL_TABLET | ORAL | Status: AC
  Filled 2018-10-17: qty 1

## 2018-10-17 MED ORDER — ROCURONIUM BROMIDE 50 MG/5ML IV SOSY
PREFILLED_SYRINGE | INTRAVENOUS | Status: DC | PRN
  Administered 2018-10-17 – 2018-10-18 (×2): 50 mg via INTRAVENOUS

## 2018-10-17 MED ORDER — PROPOFOL 1000 MG/100ML IV EMUL
INTRAVENOUS | Status: AC
  Administered 2018-10-17: 23:00:00 via INTRAVENOUS
  Filled 2018-10-17: qty 100

## 2018-10-17 MED ORDER — CLOPIDOGREL BISULFATE 300 MG PO TABS
ORAL_TABLET | ORAL | Status: AC
  Filled 2018-10-17: qty 1

## 2018-10-17 MED ORDER — ETOMIDATE 2 MG/ML IV SOLN
INTRAVENOUS | Status: DC | PRN
  Administered 2018-10-17: 20 mg via INTRAVENOUS

## 2018-10-17 MED ORDER — ASPIRIN 81 MG PO CHEW
CHEWABLE_TABLET | ORAL | Status: AC
  Filled 2018-10-17: qty 1

## 2018-10-17 MED ORDER — ACETAMINOPHEN 325 MG PO TABS: 650.0000 mg | ORAL_TABLET | ORAL | Status: DC | PRN

## 2018-10-17 NOTE — Anesthesia Procedure Notes (Addendum)
Arterial Line Insertion Start/End5/21/2020 11:31 AM, 10/17/2018 11:33 AM Performed by: Babs Bertin, CRNA, CRNA  Patient location: [facility] procedure area. Preanesthetic checklist: patient identified, IV checked, site marked, surgical consent, monitors and equipment checked, pre-op evaluation, timeout performed and anesthesia consent Patient sedated Left, radial was placed Catheter size: 20 G Hand hygiene performed  and maximum sterile barriers used   Attempts: 1 Procedure performed without using ultrasound guided technique. Ultrasound Notes:anatomy identified Following insertion, dressing applied. Post procedure assessment: normal and unchanged

## 2018-10-17 NOTE — ED Provider Notes (Addendum)
[facility] EMERGENCY DEPARTMENT Provider Note   CSN: [id number] Arrival date & time: 10/17/18  2206    History   Chief Complaint Chief Complaint  Patient presents with   Code Stroke    HPI Natalie Gardner is a 73 y.o. female. atrial fibrillation on Coumadin, CHF, chronic kidney disease hypertension, hyperlipidemia, prediabetes presents to the emergency room with sudden onset aphasia and right facial droop that was witnessed by family at 74 PM. On arrival to ED patient still with right sided facial droop and aphasia. History is otherwise limited by acuity of condition.     HPI  Past Medical History:  Diagnosis Date   Anemia    Aortic valve regurgitation    Arthritis    Atrial fibrillation ([place])    Cancer ([place]) 12/2016   Basal Cell Skin Cancer   Cardiomyopathy ([place])    CHF (congestive heart failure) (HCC)    CKD (chronic kidney disease)    Colitis    Diverticulosis    Dyspnea    Dysrhythmia    GERD (gastroesophageal reflux disease)    GI bleed    Heart murmur    History of hiatal hernia    Hypercholesterolemia    Hyperlipidemia    Hypertension    Insomnia    Osteopenia    Pre-diabetes    Urinary, incontinence, stress female    Vertigo     Patient Active Problem List   Diagnosis Date Noted   Middle cerebral artery embolism, 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   Advanced care planning/counseling discussion 09/28/2016   Essential hypertension 09/13/2015   CHF (congestive heart failure) ([place]) 09/13/2015   Atrial fibrillation ([place]) 09/13/2015   Hyperlipidemia 09/13/2015   Obesity, diabetes, and hypertension syndrome ([place]) 09/13/2015    Past Surgical History:  Procedure Laterality Date   ANKLE SURGERY Right 1997   Fractured ankle   COLONOSCOPY     COLONOSCOPY WITH PROPOFOL N/A 02/08/2015   Procedure: COLONOSCOPY WITH PROPOFOL;  Surgeon: Manya Silvas, MD;  Location: [place];  Service: Endoscopy;  Laterality: N/A;   COLONOSCOPY WITH PROPOFOL N/A 08/20/2017   Procedure: COLONOSCOPY WITH PROPOFOL;  Surgeon: Manya Silvas, MD;  Location: [facility] ENDOSCOPY;  Service: Endoscopy;  Laterality: N/A;   ESOPHAGOGASTRODUODENOSCOPY (EGD) WITH PROPOFOL N/A 08/20/2017   Procedure: ESOPHAGOGASTRODUODENOSCOPY (EGD) WITH PROPOFOL;  Surgeon: Manya Silvas, MD;  Location: [facility] ENDOSCOPY;  Service: Endoscopy;  Laterality: N/A;   INTRAMEDULLARY (IM) NAIL INTERTROCHANTERIC Left 06/09/2018   Procedure: INTRAMEDULLARY (IM) NAIL INTERTROCHANTRIC;  Surgeon: Thornton Park, MD;  Location: [facility] ORS;  Service: Orthopedics;  Laterality: Left;   KNEE ARTHROPLASTY Left 02/12/2017   Procedure: COMPUTER ASSISTED TOTAL KNEE ARTHROPLASTY;  Surgeon: Dereck Leep, MD;  Location: [facility] ORS;  Service: Orthopedics;  Laterality: Left;   RADIAL HEAD ARTHROPLASTY Left 06/21/2017   Procedure: RADIAL HEAD ARTHROPLASTY;  Surgeon: Corky Mull, MD;  Location: [facility] ORS;  Service: Orthopedics;  Laterality: Left;   SACROCOCCYGEAL ULCER REMOVAL     TONSILLECTOMY     TOTAL KNEE ARTHROPLASTY Left      OB History   No obstetric history on file.      Home Medications    Prior to Admission medications   Medication Sig Start Date End Date Taking? Authorizing Provider  acetaminophen (TYLENOL) 650  MG CR tablet Take 1,300 mg by mouth at bedtime.    [provider]  atorvastatin (LIPITOR) 10 MG tablet Take 1 tablet (10 mg total) by mouth at bedtime. 07/03/18   Guadalupe Maple, MD  diltiazem (DILACOR XR) 240 MG 24 hr capsule Take 1 capsule (240 mg total) by mouth daily. 07/03/18   Guadalupe Maple, MD  docusate sodium (COLACE) 100 MG capsule Take 1 capsule (100 mg total) by mouth 2 (two) times daily as needed for mild constipation. 06/11/18   Henreitta Leber, MD  ferrous sulfate 325 (65 FE) MG tablet Take 1 tablet (325 mg total) by mouth 2 (two) times daily with a meal. 11/14/17   Guadalupe Maple, MD  furosemide (LASIX) 20 MG tablet  08/24/18   [provider]  lisinopril (PRINIVIL,ZESTRIL) 10 MG tablet Take 1 tablet (10 mg total) by mouth daily. 07/03/18   Guadalupe Maple, MD  Multiple Vitamin (MULTI-VITAMINS) TABS Take 1 tablet by mouth daily.    [provider]  ranitidine (ZANTAC) 150 MG tablet Take 150 mg by mouth daily.    [provider]  sucralfate (CARAFATE) 1 g tablet Take 1 tablet (1 g total) by mouth 2 (two) times daily. 06/19/18   Gerlene Fee, NP  traZODone (DESYREL) 50 MG tablet Take 0.5-1 tablets (25-50 mg total) by mouth at bedtime as needed for sleep. 06/19/18   Gerlene Fee, NP  venlafaxine XR (EFFEXOR XR) 75 MG 24 hr capsule Take 1 capsule (75 mg total) by mouth daily with breakfast. 07/03/18   Guadalupe Maple, MD  warfarin (COUMADIN) 1 MG tablet Take 1 tablet (1 mg total) by mouth daily. Give with the 6 mg tablet to equal 7 mg 06/21/18   Gerlene Fee, NP  warfarin (COUMADIN) 5 MG tablet Take 5 mg by mouth one time only at 6 PM. Also takes 1.5 tablets (7.5 mg) once weekly.    [provider]  warfarin (COUMADIN) 6 MG tablet Take 1 tablet (6 mg total) by mouth at bedtime. 06/21/18   Gerlene Fee, NP    Family History Family History  Problem Relation Age of Onset   Heart disease Mother    Diabetes Mother    Heart  disease Father    Parkinson's disease Father    Diabetes Brother    Cancer Brother        Colon    Social History Social History   Tobacco Use   Smoking status: Never Smoker   Smokeless tobacco: Never Used  Substance Use Topics   Alcohol use: No   Drug use: No     Allergies   Penicillins; Penicillin v potassium; Protonix [pantoprazole sodium]; and Sulfa antibiotics   Review of Systems Review of Systems  Unable to perform ROS: Acuity of condition  Constitutional: Negative for fever.       Patient can answer yes and no, but has trouble with word finding  Eyes: Negative for visual disturbance.  Respiratory: Negative for cough and shortness of breath.   Cardiovascular: Negative for chest  pain and palpitations.  Gastrointestinal: Negative for abdominal pain and vomiting.  Genitourinary: Negative for dysuria.  Musculoskeletal: Negative for back pain.  Skin: Negative for color change and rash.  Neurological: Positive for speech difficulty. Negative for syncope and weakness.  All other systems reviewed and are negative.    Physical Exam Updated Vital Signs BP 111/73    Pulse (!) 127    Temp (!) 100.6 F (38.1 C) Comment: ice packs   Resp (!) 22    Ht _0  (1.651 m)    Wt 82.1 kg    SpO2 100%    BMI 30.12 kg/m   Physical Exam Vitals signs and nursing note reviewed.  Constitutional:      General: She is not in acute distress.    Appearance: She is well-developed.  HENT:     Head: Normocephalic and atraumatic.  Eyes:     Conjunctiva/sclera: Conjunctivae normal.  Neck:     Musculoskeletal: Neck supple.  Cardiovascular:     Rate and Rhythm: Normal rate and regular rhythm.     Heart sounds: No murmur.  Pulmonary:     Effort: Pulmonary effort is normal. No respiratory distress.     Breath sounds: Normal breath sounds.  Abdominal:     Palpations: Abdomen is soft.     Tenderness: There is no abdominal tenderness.  Skin:    General: Skin is warm and dry.    Neurological:     Mental Status: She is alert.     Cranial Nerves: Dysarthria and facial asymmetry (right sided facial droop) present. No cranial nerve deficit.     Motor: No weakness (of her upper or lower extremeties).     Comments: Oriented to name. But dysarthria to all other questions      ED Treatments / Results  Labs (all labs ordered are listed, but only abnormal results are displayed) Labs Reviewed  PROTIME-INR - Abnormal; Notable for the following components:      Result Value   Prothrombin Time 15.3 (*)    All other components within normal limits  CBC - Abnormal; Notable for the following components:   RDW 15.9 (*)    All other components within normal limits  COMPREHENSIVE METABOLIC PANEL - Abnormal; Notable for the following components:   CO2 21 (*)    Glucose, Bld 123 (*)    Albumin 3.4 (*)    All other components within normal limits  LIPID PANEL - Abnormal; Notable for the following components:   Triglycerides 150 (*)    All other components within normal limits  CBC WITH DIFFERENTIAL/PLATELET - Abnormal; Notable for the following components:   RBC 3.08 (*)    Hemoglobin 9.2 (*)    HCT 29.0 (*)    RDW 15.8 (*)    Neutro Abs 8.4 (*)    All other components within normal limits  BASIC METABOLIC PANEL - Abnormal; Notable for the following components:   CO2 20 (*)    Glucose, Bld 170 (*)    Calcium 7.6 (*)    All other components within normal limits  MAGNESIUM - Abnormal; Notable for the following components:   Magnesium 1.6 (*)    All other components within normal limits  GLUCOSE, CAPILLARY - Abnormal; Notable for the following components:   Glucose-Capillary 136 (*)    All other components within normal limits  GLUCOSE, CAPILLARY - Abnormal; Notable for the following components:   Glucose-Capillary 124 (*)    All other components within normal limits  I-STAT CHEM 8, ED - Abnormal; Notable for the following components:   Glucose, Bld 120 (*)     Calcium, Ion 1.09 (*)    TCO2 21 (*)    All other components within normal limits  CBG MONITORING, ED - Abnormal; Notable for the following components:   Glucose-Capillary 125 (*)    All other components within normal limits  POCT I-STAT 7, (LYTES, BLD GAS, ICA,H+H) - Abnormal; Notable for the following components:   pO2, Arterial 69.0 (*)    Bicarbonate 19.6 (*)    TCO2 21 (*)    Acid-base deficit 5.0 (*)    HCT 31.0 (*)    Hemoglobin 10.5 (*)    All other components within normal limits  POCT I-STAT 7, (LYTES, BLD GAS, ICA,H+H) - Abnormal; Notable for the following components:   pH, Arterial 7.346 (*)    Bicarbonate 19.8 (*)    TCO2 21 (*)    Acid-base deficit 6.0 (*)    HCT 29.0 (*)    Hemoglobin 9.9 (*)    All other components within normal limits  SARS CORONAVIRUS 2 (HOSPITAL ORDER, [facility])  MRSA PCR SCREENING  APTT  DIFFERENTIAL  HEMOGLOBIN A1C  TRIGLYCERIDES  PHOSPHORUS  BLOOD GAS, ARTERIAL    EKG EKG Interpretation  Date/Time:  Thursday Oct 17 2018 22:26:29 EDT Ventricular Rate:  121 PR Interval:    QRS Duration: 85 QT Interval:  333 QTC Calculation: 473 R Axis:   84 Text Interpretation:  Atrial fibrillation Anteroseptal infarct, age indeterminate When compared with ECG of 06/25/2018, HEART RATE has increased Confirmed by Delora Fuel ([id number]) on 10/17/2018 11:27:26 PM Also confirmed by Delora Fuel ([id number]), editor Philomena Doheny [phone])  on 10/18/2018 7:01:19 AM   Radiology Ct Angio Head W Or Wo Contrast  Result Date: 10/17/2018 CLINICAL DATA:  73 y/o  F; aphasia and right-sided facial droop. EXAM: CT ANGIOGRAPHY HEAD AND NECK TECHNIQUE: Multidetector CT imaging of the head and neck was performed using the standard protocol during bolus administration of intravenous contrast. Multiplanar CT image reconstructions and MIPs were obtained to evaluate the vascular anatomy. Carotid stenosis measurements (when applicable) are obtained  utilizing NASCET criteria, using the distal internal carotid diameter as the denominator. CONTRAST:  66m OMNIPAQUE IOHEXOL 350 MG/ML SOLN COMPARISON:  10/17/2018 CT of the head. FINDINGS: CTA NECK FINDINGS Aortic arch: Standard branching. Imaged portion shows no evidence of aneurysm or dissection. No significant stenosis of the major arch vessel origins. Right carotid system: No evidence of dissection, stenosis (50% or greater) or occlusion. Minimal calcific atherosclerosis of the carotid bifurcation. Left carotid system: No evidence of dissection, stenosis (50% or greater) or occlusion. Mild calcific atherosclerosis of the carotid bifurcation minimal less than 30% proximal ICA stenosis. Vertebral arteries: Codominant. No evidence of dissection, stenosis (50% or greater) or occlusion. Skeleton: No acute osseous abnormality. Cervical spondylosis with discogenic degenerative changes greatest at the C6-7 level and prominent upper cervical facet arthropathy. Grade 1 C3-4, C4-5, and C5-6 anterolisthesis. No high-grade bony spinal canal stenosis. Other neck: Heterogeneous multinodular thyroid gland. Upper chest: Negative. Review of the MIP images confirms the above findings CTA HEAD FINDINGS Anterior circulation: Distal left M1/proximal M2 superior inferior sylvian division occlusion with intermediate collateralization in left MCA distribution (series 7, image 100). Otherwise no significant stenosis, proximal occlusion, aneurysm, or vascular malformation. Posterior circulation: No significant stenosis, proximal occlusion, aneurysm, or vascular malformation. Venous sinuses: As permitted by contrast timing, patent. Anatomic variants: None significant. Review of  the MIP images confirms the above findings IMPRESSION: 1. Occlusion of distal left M1/proximal M2 superior and inferior sylvian divisions with intermediate collateralization. 2. Patent carotid and vertebral arteries. No dissection, aneurysm, or hemodynamically  significant stenosis utilizing NASCET criteria. These results were called by telephone at the time of interpretation on 10/17/2018 at 10:43 pm to Dr. Samara Snide , who verbally acknowledged these results. Electronically Signed   By: Kristine Garbe M.D.   On: 10/17/2018 22:45   Dg Chest 1 View  Result Date: 10/17/2018 CLINICAL DATA:  73 year old female intubated, left MCA emergent large vessel occlusion. EXAM: CHEST  1 VIEW COMPARISON:  Portable chest 06/08/2018. FINDINGS: Portable AP semi upright view at 2311 hours. Endotracheal tube tip in good position between the level the clavicles and carina. Enteric tube courses to the abdomen, side hole at the level of the gastric fundus. Lower lung volumes. Stable cardiac size and mediastinal contours. Increased pulmonary vascularity without overt edema. No pneumothorax, pleural effusion or consolidation. IMPRESSION: 1. ET tube and enteric tube in good position. 2. Lower lung volumes with pulmonary vascular congestion but no overt edema. Electronically Signed   By: Genevie Ann M.D.   On: 10/17/2018 23:25   Ct Angio Neck W And/or Wo Contrast  Result Date: 10/17/2018 CLINICAL DATA:  73 y/o  F; aphasia and right-sided facial droop. EXAM: CT ANGIOGRAPHY HEAD AND NECK TECHNIQUE: Multidetector CT imaging of the head and neck was performed using the standard protocol during bolus administration of intravenous contrast. Multiplanar CT image reconstructions and MIPs were obtained to evaluate the vascular anatomy. Carotid stenosis measurements (when applicable) are obtained utilizing NASCET criteria, using the distal internal carotid diameter as the denominator. CONTRAST:  39m OMNIPAQUE IOHEXOL 350 MG/ML SOLN COMPARISON:  10/17/2018 CT of the head. FINDINGS: CTA NECK FINDINGS Aortic arch: Standard branching. Imaged portion shows no evidence of aneurysm or dissection. No significant stenosis of the major arch vessel origins. Right carotid system: No evidence of  dissection, stenosis (50% or greater) or occlusion. Minimal calcific atherosclerosis of the carotid bifurcation. Left carotid system: No evidence of dissection, stenosis (50% or greater) or occlusion. Mild calcific atherosclerosis of the carotid bifurcation minimal less than 30% proximal ICA stenosis. Vertebral arteries: Codominant. No evidence of dissection, stenosis (50% or greater) or occlusion. Skeleton: No acute osseous abnormality. Cervical spondylosis with discogenic degenerative changes greatest at the C6-7 level and prominent upper cervical facet arthropathy. Grade 1 C3-4, C4-5, and C5-6 anterolisthesis. No high-grade bony spinal canal stenosis. Other neck: Heterogeneous multinodular thyroid gland. Upper chest: Negative. Review of the MIP images confirms the above findings CTA HEAD FINDINGS Anterior circulation: Distal left M1/proximal M2 superior inferior sylvian division occlusion with intermediate collateralization in left MCA distribution (series 7, image 100). Otherwise no significant stenosis, proximal occlusion, aneurysm, or vascular malformation. Posterior circulation: No significant stenosis, proximal occlusion, aneurysm, or vascular malformation. Venous sinuses: As permitted by contrast timing, patent. Anatomic variants: None significant. Review of the MIP images confirms the above findings IMPRESSION: 1. Occlusion of distal left M1/proximal M2 superior and inferior sylvian divisions with intermediate collateralization. 2. Patent carotid and vertebral arteries. No dissection, aneurysm, or hemodynamically significant stenosis utilizing NASCET criteria. These results were called by telephone at the time of interpretation on 10/17/2018 at 10:43 pm to Dr. SSamara Snide, who verbally acknowledged these results. Electronically Signed   By: LKristine GarbeM.D.   On: 10/17/2018 22:45   Mr Brain Wo Contrast  Result Date: 10/18/2018 CLINICAL DATA:  Stroke suspected. Left  MCA occlusion with  embolectomy. EXAM: MRI HEAD WITHOUT CONTRAST TECHNIQUE: Multiplanar, multiecho pulse sequences of the brain and surrounding structures were obtained without intravenous contrast. COMPARISON:  CTA of the head neck from yesterday. Catheter angiography with CT from yesterday. FINDINGS: Brain: Patchy acute infarct following cortex and subjacent white matter of the posterior insula and lateral frontal lobe. Less extensive white matter infarction extending subjacent towards the lateral ventricle. There is petechial hemorrhage without discrete measurable hematoma or mass effect. There is FLAIR hyperintensity following left cerebral sulci at the level of infarction. No hydrocephalus, mass, or collection. Minor microvascular ischemic type change in the cerebral white matter. Age normal brain volume. Vascular: Major flow voids are preserved Skull and upper cervical spine: Negative for marrow lesion Sinuses/Orbits: Nasopharyngeal fluid in the setting of intubation IMPRESSION: 1. Moderate acute infarct in the left insula and lateral frontal lobe, primarily involving cortex. 2. Petechial hemorrhage at the infarct. There is also subarachnoid hemorrhage/contrast that is stable or decreased in extent compared to cone beam CT from yesterday Electronically Signed   By: Monte Fantasia M.D.   On: 10/18/2018 05:01   Ct Head Code Stroke Wo Contrast  Result Date: 10/17/2018 CLINICAL DATA:  Code stroke. Aphasia and right-sided facial droop. Last seen normal 2100 EXAM: CT HEAD WITHOUT CONTRAST TECHNIQUE: Contiguous axial images were obtained from the base of the skull through the vertex without intravenous contrast. COMPARISON:  None. FINDINGS: Brain: There is no mass, hemorrhage or extra-axial collection. The size and configuration of the ventricles and extra-axial CSF spaces are normal. The brain parenchyma is normal, without evidence of acute or chronic infarction. Vascular: No abnormal hyperdensity of the major intracranial  arteries or dural venous sinuses. No intracranial atherosclerosis. Skull: The visualized skull base, calvarium and extracranial soft tissues are normal. Sinuses/Orbits: No fluid levels or advanced mucosal thickening of the visualized paranasal sinuses. No mastoid or middle ear effusion. The orbits are normal. ASPECTS [facility] Stroke Program Early CT Score) - Ganglionic level infarction (caudate, lentiform nuclei, internal capsule, insula, M1-M3 cortex): 7 - Supraganglionic infarction (M4-M6 cortex): 3 Total score (0-10 with 10 being normal): 10 IMPRESSION: 1. Normal aging brain. 2. ASPECTS is 10. These results were communicated to Dr. Karena Addison Aroor at 10:21 pm on 10/17/2018 by text page via the [facility] messaging system. Electronically Signed   By: Ulyses Jarred M.D.   On: 10/17/2018 22:21    Procedures Procedure Name: Intubation Date/Time: 10/18/2018 2:34 PM Performed by: Doneta Public, MD Pre-anesthesia Checklist: Suction available, Emergency Drugs available, Patient identified and Patient being monitored Oxygen Delivery Method: Simple face mask Preoxygenation: Pre-oxygenation with 100% oxygen Induction Type: Rapid sequence Laryngoscope Size: Mac and 3 Grade View: Grade II Tube size: 7.5 mm Number of attempts: 1 Airway Equipment and Method: Rigid stylet and Video-laryngoscopy Secured at: 24 cm Tube secured with: ETT holder      (including critical care time)  Medications Ordered in ED Medications  sodium chloride flush (NS) 0.9 % injection 3 mL (has no administration in time range)   stroke: mapping our early stages of recovery book (has no administration in time range)  vancomycin (VANCOCIN) 1-5 GM/200ML-% IVPB (has no administration in time range)  acetaminophen (TYLENOL) tablet 650 mg ( Oral See Alternative 10/18/18 1216)    Or  acetaminophen (TYLENOL) solution 650 mg ( Per Tube See Alternative 10/18/18 1216)    Or  acetaminophen (TYLENOL) suppository 650 mg (650 mg Rectal Given  10/18/18 1216)  0.9 %  sodium chloride infusion (  Intravenous New Bag/Given 10/18/18 1416)  clevidipine (CLEVIPREX) infusion 0.5 mg/mL (2 mg/hr Intravenous Rate/Dose Verify 10/18/18 1400)  Chlorhexidine Gluconate Cloth 2 % PADS 6 each (6 each Topical Given 10/18/18 0244)  chlorhexidine gluconate (MEDLINE KIT) (PERIDEX) 0.12 % solution 15 mL (15 mLs Mouth Rinse Given 10/18/18 0740)  MEDLINE mouth rinse (15 mLs Mouth Rinse Given 10/18/18 1220)  famotidine (PEPCID) IVPB 20 mg premix ( Intravenous Stopped 10/18/18 1026)  fentaNYL (SUBLIMAZE) injection 25 mcg (25 mcg Intravenous Given 10/18/18 0715)  fentaNYL (SUBLIMAZE) injection 25-100 mcg (has no administration in time range)  propofol (DIPRIVAN) 1000 MG/100ML infusion (0 mcg/kg/min  83.7 kg Intravenous Stopped 10/18/18 0244)  docusate (COLACE) 50 MG/5ML liquid 100 mg (has no administration in time range)  bisacodyl (DULCOLAX) suppository 10 mg (has no administration in time range)  metoprolol tartrate (LOPRESSOR) injection 5 mg (5 mg Intravenous Given 10/18/18 0934)  iohexol (OMNIPAQUE) 350 MG/ML injection 75 mL (75 mLs Intravenous Contrast Given 10/17/18 2228)  labetalol (NORMODYNE) 5 MG/ML injection (  Given 10/17/18 1107)  alteplase (ACTIVASE) 1 mg/mL infusion 75.3 mg (0 mg Intravenous Stopped 10/18/18 0000)    Followed by  0.9 %  sodium chloride infusion (0 mLs Intravenous Stopped 10/18/18 0245)  nitroGLYCERIN 100 mcg/mL intra-arterial injection (50 mcg Intra-arterial Given 10/18/18 0141)  fentaNYL (SUBLIMAZE) 100 MCG/2ML injection (  Override pull for Anesthesia 10/18/18 0009)  iohexol (OMNIPAQUE) 300 MG/ML solution 156 mL (156 mLs Intravenous Contrast Given 10/18/18 0223)     Initial Impression / Assessment and Plan / ED Course  I have reviewed the triage vital signs and the nursing notes.  Pertinent labs & imaging results that were available during my care of the patient were reviewed by me and considered in my medical decision making (see chart for  details).   LOYAL RUDY is a 73 y.o. female who presents to the ED who presents to the ED as a CODE STROKE for neurologic deficits, including right sided facial droop and dysarthria. Symptoms started at 9pm. Patient met in the hallway, evaluated by myself and by the stroke team MD. Patient to the CT scanner urgently.   CT scan demonstrates left M1 thrombus.   Patient given TPA in the ED and intubated to be taken to IR by Neurology.     Final Clinical Impressions(s) / ED Diagnoses   Final diagnoses:  Endotracheally intubated  Acute ischemic stroke [facility])    ED Discharge Orders    None       Doneta Public, MD 10/18/18 1434    Doneta Public, MD 10/18/18 1436    Tegeler, Gwenyth Allegra, MD 10/19/18 [phone]

## 2018-10-17 NOTE — H&P (Signed)
Chief Complaint: Aphasia  History obtained from: Patient and Chart   HPI:                                                                                                                                       Natalie Gardner is a 73 y.o. female with past medical history of atrial fibrillation on Coumadin, CHF, chronic kidney disease hypertension, hyperlipidemia, prediabetes presents to the emergency room with sudden onset aphasia and right facial droop noted by family at 48 PM today.  On arrival patient has difficulty saying sentences, but able to follow commands.  NIH stroke scale 7 on arrival.  Patient in A. fib with heart rate of 130s. Stat CT head was obtained which showed no acute hemorrhage.  Aspects was 10.  A stat CT angiogram was performed showed a distal left M1/proximal M2 occlusion.    Patient has been having difficulty with diarrhea and vomiting and has not been able to hold down medications the last 3 days. tPA was administered after INR returned as 1.2.  Discussed with daughter and husband over the phone regarding risk versus benefit of IV TPA as well as performing mechanical thrombectomy and family consented and patient was taken to IR.    Date last known well: 5 21-20 Time last known well: 9 PM tPA Given: Yes NIHSS: 7  Baseline MRS 2   Past Medical History:  Diagnosis Date  . Anemia   . Aortic valve regurgitation   . Arthritis   . Atrial fibrillation ([place])   . Cancer ([place]) 12/2016   Basal Cell Skin Cancer  . Cardiomyopathy ([place])   . CHF (congestive heart failure) ([place])   . CKD (chronic kidney disease)   . Colitis   . Diverticulosis   . Dyspnea   . Dysrhythmia   . GERD (gastroesophageal reflux disease)   . GI bleed   . Heart murmur   . History of hiatal hernia   . Hypercholesterolemia   . Hyperlipidemia   . Hypertension   . Insomnia   . Osteopenia   . Pre-diabetes   . Urinary, incontinence, stress female   . Vertigo     Past Surgical History:   Procedure Laterality Date  . ANKLE SURGERY Right 1997   Fractured ankle  . COLONOSCOPY    . COLONOSCOPY WITH PROPOFOL N/A 02/08/2015   Procedure: COLONOSCOPY WITH PROPOFOL;  Surgeon: Manya Silvas, MD;  Location: [facility] ENDOSCOPY;  Service: Endoscopy;  Laterality: N/A;  . COLONOSCOPY WITH PROPOFOL N/A 08/20/2017   Procedure: COLONOSCOPY WITH PROPOFOL;  Surgeon: Manya Silvas, MD;  [facility] END[facility]COPY;  Service: Endoscopy;  Laterality: N/A;  . ESOPHAGOGASTRODUODENOSCOPY (EGD) WITH PROPOFOL N/A 08/20/2017   Procedure: ESOPHAGOGASTRODUODENOSCOPY (EGD) WITH PROPOFOL;  Surgeon: Manya Silvas, MD;  Location: [facility] ENDOSCOPY;  Service: Endoscopy;  Laterality: N/A;  . INTRAMEDULLARY (IM) NAIL INTERTROCHANTERIC Left 06/09/2018   Procedure: INTRAMEDULLARY (IM) NAIL  INTERTROCHANTRIC;  Surgeon: Thornton Park, MD;  Location: [facility] ORS;  Service: Orthopedics;  Laterality: Left;  . KNEE ARTHROPLASTY Left 02/12/2017   Procedure: COMPUTER ASSISTED TOTAL KNEE ARTHROPLASTY;  Surgeon: Dereck Leep, MD;  Location: [facility] ORS;  Service: Orthopedics;  Laterality: Left;  . RADIAL HEAD ARTHROPLASTY Left 06/21/2017   Procedure: RADIAL HEAD ARTHROPLASTY;  Surgeon: Corky Mull, MD;  Location: [facility] ORS;  Service: Orthopedics;  Laterality: Left;  . SACROCOCCYGEAL ULCER REMOVAL    . TONSILLECTOMY    . TOTAL KNEE ARTHROPLASTY Left     Family History  Problem Relation Age of Onset  . Heart disease Mother   . Diabetes Mother   . Heart disease Father   . Parkinson's disease Father   . Diabetes Brother   . Cancer Brother        Colon   Social History:  reports that she has never smoked. She has never used smokeless tobacco. She reports that she does not drink alcohol or use drugs.  Allergies:  Allergies  Allergen Reactions  . Penicillins     Yeast infections Has patient had a PCN reaction causing immediate rash, facial/tongue/throat swelling, SOB or lightheadedness with hypotension: No Has patient  had a PCN reaction causing severe rash involving mucus membranes or skin necrosis: No Has patient had a PCN reaction that required hospitalization: No Has patient had a PCN reaction occurring within the last 10 years: No If all of the above answers are "NO", then may proceed with Cephalosporin use.   Marland Kitchen Penicillin V Potassium Other (See Comments)    Yeast infection  . Protonix [Pantoprazole Sodium] Rash  . Sulfa Antibiotics     Yeast infections  Other reaction(s): Other (See Comments) Yeast infection    Medications:                                                                                                                        I reviewed home medications   ROS:                                                                                                                                     14 systems reviewed and negative except above   Examination:  General: Appears well-developed Psych: Affect anxious Eyes: No scleral injection HENT: No OP obstrucion Head: Normocephalic.  Cardiovascular: irregular rate and rhythm Respiratory: Effort normal and breath sounds normal to anterior ascultation GI: Soft.  No distension. There is no tenderness.  Skin: WDI    Neurological Examination Mental Status: Alert, able to state her name but not age or month.  Unable to repeat.  Speech fluent without evidence of aphasia.  Follows most simple commands. Cranial Nerves: II: Visual fields grossly normal, blink to threat bilaterally III,IV, VI: ptosis not present, extra-ocular motions intact bilaterally, pupils equal, round, reactive to light and accommodation V,VII: right facial droop, reduced sensation to light touch on right side VIII: hearing normal bilaterally IX,X: uvula rises symmetrically XI: bilateral shoulder shrug XII: midline tongue extension Motor: Right : Upper extremity    4+/5    Left:     Upper extremity   5/5  Lower extremity   4+/5     Lower extremity   5/5 Tone and bulk:normal tone throughout; no atrophy noted Sensory: Reduced sensation to light touch on the right arm and right leg Plantars: Right: downgoing   Left: downgoing Cerebellar: No significant ataxia out of proportion to weakness on the right side Gait: Not assessed due to safety     Lab Results: Basic Metabolic Panel: Recent Labs  Lab 10/17/18 08/30/2210 10/17/18 2218  NA 135 136  K 4.1 4.1  CL 104 105  CO2 21*  --   GLUCOSE 123* 120*  BUN 14 15  CREATININE 0.88 0.80  CALCIUM 8.9  --     CBC: Recent Labs  Lab 10/17/18 08-30-2210 10/17/18 2218  WBC 7.9  --   NEUTROABS 6.0  --   HGB 12.0 13.3  HCT 38.8 39.0  MCV 95.6  --   PLT 281  --     Coagulation Studies: Recent Labs    10/17/18 2210/08/30  LABPROT 15.3*  INR 1.2    Imaging: Ct Angio Head W Or Wo Contrast  Result Date: 10/17/2018 CLINICAL DATA:  73 y/o  F; aphasia and right-sided facial droop. EXAM: CT ANGIOGRAPHY HEAD AND NECK TECHNIQUE: Multidetector CT imaging of the head and neck was performed using the standard protocol during bolus administration of intravenous contrast. Multiplanar CT image reconstructions and MIPs were obtained to evaluate the vascular anatomy. Carotid stenosis measurements (when applicable) are obtained utilizing NASCET criteria, using the distal internal carotid diameter as the denominator. CONTRAST:  80mL OMNIPAQUE IOHEXOL 350 MG/ML SOLN COMPARISON:  10/17/2018 CT of the head. FINDINGS: CTA NECK FINDINGS Aortic arch: Standard branching. Imaged portion shows no evidence of aneurysm or dissection. No significant stenosis of the major arch vessel origins. Right carotid system: No evidence of dissection, stenosis (50% or greater) or occlusion. Minimal calcific atherosclerosis of the carotid bifurcation. Left carotid system: No evidence of dissection, stenosis (50% or greater) or occlusion. Mild calcific  atherosclerosis of the carotid bifurcation minimal less than 30% proximal ICA stenosis. Vertebral arteries: Codominant. No evidence of dissection, stenosis (50% or greater) or occlusion. Skeleton: No acute osseous abnormality. Cervical spondylosis with discogenic degenerative changes greatest at the C6-7 level and prominent upper cervical facet arthropathy. Grade 1 C3-4, C4-5, and C5-6 anterolisthesis. No high-grade bony spinal canal stenosis. Other neck: Heterogeneous multinodular thyroid gland. Upper chest: Negative. Review of the MIP images confirms the above findings CTA HEAD FINDINGS Anterior circulation: Distal left M1/proximal M2 superior inferior sylvian division occlusion with intermediate collateralization in left MCA distribution (series 7, image 100). Otherwise  no significant stenosis, proximal occlusion, aneurysm, or vascular malformation. Posterior circulation: No significant stenosis, proximal occlusion, aneurysm, or vascular malformation. Venous sinuses: As permitted by contrast timing, patent. Anatomic variants: None significant. Review of the MIP images confirms the above findings IMPRESSION: 1. Occlusion of distal left M1/proximal M2 superior and inferior sylvian divisions with intermediate collateralization. 2. Patent carotid and vertebral arteries. No dissection, aneurysm, or hemodynamically significant stenosis utilizing NASCET criteria. These results were called by telephone at the time of interpretation on 10/17/2018 at 10:43 pm to Dr. Samara Snide , who verbally acknowledged these results. Electronically Signed   By: Kristine Garbe M.D.   On: 10/17/2018 22:45   Ct Angio Neck W And/or Wo Contrast  Result Date: 10/17/2018 CLINICAL DATA:  73 y/o  F; aphasia and right-sided facial droop. EXAM: CT ANGIOGRAPHY HEAD AND NECK TECHNIQUE: Multidetector CT imaging of the head and neck was performed using the standard protocol during bolus administration of intravenous contrast.  Multiplanar CT image reconstructions and MIPs were obtained to evaluate the vascular anatomy. Carotid stenosis measurements (when applicable) are obtained utilizing NASCET criteria, using the distal internal carotid diameter as the denominator. CONTRAST:  21mL OMNIPAQUE IOHEXOL 350 MG/ML SOLN COMPARISON:  10/17/2018 CT of the head. FINDINGS: CTA NECK FINDINGS Aortic arch: Standard branching. Imaged portion shows no evidence of aneurysm or dissection. No significant stenosis of the major arch vessel origins. Right carotid system: No evidence of dissection, stenosis (50% or greater) or occlusion. Minimal calcific atherosclerosis of the carotid bifurcation. Left carotid system: No evidence of dissection, stenosis (50% or greater) or occlusion. Mild calcific atherosclerosis of the carotid bifurcation minimal less than 30% proximal ICA stenosis. Vertebral arteries: Codominant. No evidence of dissection, stenosis (50% or greater) or occlusion. Skeleton: No acute osseous abnormality. Cervical spondylosis with discogenic degenerative changes greatest at the C6-7 level and prominent upper cervical facet arthropathy. Grade 1 C3-4, C4-5, and C5-6 anterolisthesis. No high-grade bony spinal canal stenosis. Other neck: Heterogeneous multinodular thyroid gland. Upper chest: Negative. Review of the MIP images confirms the above findings CTA HEAD FINDINGS Anterior circulation: Distal left M1/proximal M2 superior inferior sylvian division occlusion with intermediate collateralization in left MCA distribution (series 7, image 100). Otherwise no significant stenosis, proximal occlusion, aneurysm, or vascular malformation. Posterior circulation: No significant stenosis, proximal occlusion, aneurysm, or vascular malformation. Venous sinuses: As permitted by contrast timing, patent. Anatomic variants: None significant. Review of the MIP images confirms the above findings IMPRESSION: 1. Occlusion of distal left M1/proximal M2 superior and  inferior sylvian divisions with intermediate collateralization. 2. Patent carotid and vertebral arteries. No dissection, aneurysm, or hemodynamically significant stenosis utilizing NASCET criteria. These results were called by telephone at the time of interpretation on 10/17/2018 at 10:43 pm to Dr. Samara Snide , who verbally acknowledged these results. Electronically Signed   By: Kristine Garbe M.D.   On: 10/17/2018 22:45   Ct Head Code Stroke Wo Contrast  Result Date: 10/17/2018 CLINICAL DATA:  Code stroke. Aphasia and right-sided facial droop. Last seen normal 2100 EXAM: CT HEAD WITHOUT CONTRAST TECHNIQUE: Contiguous axial images were obtained from the base of the skull through the vertex without intravenous contrast. COMPARISON:  None. FINDINGS: Brain: There is no mass, hemorrhage or extra-axial collection. The size and configuration of the ventricles and extra-axial CSF spaces are normal. The brain parenchyma is normal, without evidence of acute or chronic infarction. Vascular: No abnormal hyperdensity of the major intracranial arteries or dural venous sinuses. No intracranial atherosclerosis. Skull: The visualized skull base,  calvarium and extracranial soft tissues are normal. Sinuses/Orbits: No fluid levels or advanced mucosal thickening of the visualized paranasal sinuses. No mastoid or middle ear effusion. The orbits are normal. ASPECTS [facility] Stroke Program Early CT Score) - Ganglionic level infarction (caudate, lentiform nuclei, internal capsule, insula, M1-M3 cortex): 7 - Supraganglionic infarction (M4-M6 cortex): 3 Total score (0-10 with 10 being normal): 10 IMPRESSION: 1. Normal aging brain. 2. ASPECTS is 10. These results were communicated to Dr. Karena Addison Loyalty Arentz at 10:21 pm on 10/17/2018 by text page via the [facility] messaging system. Electronically Signed   By: Ulyses Jarred M.D.   On: 10/17/2018 22:21     ASSESSMENT AND PLAN   73 y.o. female with past medical history of atrial  fibrillation on Coumadin, CHF, chronic kidney disease hypertension, hyperlipidemia, prediabetes presents to the emergency room with sudden onset aphasia and right facial droop that was witnessed by family at 38 PM.  Left MCA stroke with distal M1/proximal M2 occlusion likely due to atrial fibrillation in the setting of subtherapeutic INR, ( nausea vomiting diarrhea and has not been able to hold onto her medications).   There was delay in TPA as we were waiting for INR before administration.  There was a brief blood pressure elevation outside the parameters of blood pressure control for TPA shortly after intubation.    #Left MCA acute ischemic stroke secondary to M1/M2 occlusion status post IV TPA and mechanical thrombectomy with TPA to be recanalization  #Contrast staining and petechial hemorrhage following mechanical thrombectomy without midline shift.  # MRI of the brain without contrast: Shows moderate size left MCA stroke with petechial hemorrhage, no midline shift. # no AP/AC until 24hrs after tPA, provided 24-hour CT head shows no hemorrhage #Transthoracic Echo  #Start Lipitor 40 mg/other high intensity statin when patient passes swallow evaluation # BP goal: 355-7 40 systolic per neuro IR goals # HBAIC and Lipid profile # Telemetry monitoring # Frequent neuro checks  #Respiratory failure Intubated for airway protection for procedure Extubation per PCCM Appreciate assistance in ventilator management  #Atrial fibrillation with RVR -No anticoagulation for 24 hours -We will need to be restarted on anticoagulation at some point, consider switching to Eliquis -Rate control management per PCCM  #Chronic systolic heart failure -Avoid high volume of IV fluids -TTE for stroke work-up -Decision to continue remaining meds per CCM  Patient is full code DVT prophylaxis SCD   This patient is neurologically critically ill due to stroke s/p tPA and mechanical thrombectomy.  She is at risk  for significant risk of neurological worsening from cerebral edema,  death from brain herniation, heart failure, hemorrhagic conversion, infection, respiratory failure and seizure. This patient's care requires constant monitoring of vital signs, hemodynamics, respiratory and cardiac monitoring, review of multiple databases, neurological assessment, discussion with family, other specialists and medical decision making of high complexity.  I spent 90 minutes of neurocritical time in the care of this patient.   Please page stroke NP  Or  PA  Or MD from 8am -4 pm  as this patient from this time will be  followed by the stroke.   You can look them up on [email or web address]  Password [email or web address]   Bethzy Hauck [facility] Pager Number [phone]

## 2018-10-17 NOTE — ED Triage Notes (Signed)
Came in via ems; c/o weakness started at 2100; along w/ facial droop

## 2018-10-17 NOTE — Anesthesia Preprocedure Evaluation (Addendum)
Anesthesia Evaluation  Patient identified by MRN, date of birth, ID band Patient unresponsive    Reviewed: Allergy & Precautions, Patient's Chart, lab work & pertinent test results, Unable to perform ROS - Chart review onlyPreop documentation limited or incomplete due to emergent nature of procedure.  Airway Mallampati: Intubated       Dental   Pulmonary    breath sounds clear to auscultation       Cardiovascular hypertension, Pt. on medications +CHF  + dysrhythmias Atrial Fibrillation  Rhythm:Irregular Rate:Tachycardia     Neuro/Psych Depression CVA    GI/Hepatic Neg liver ROS, hiatal hernia, GERD  Medicated,  Endo/Other  diabetes  Renal/GU Renal InsufficiencyRenal disease     Musculoskeletal   Abdominal Normal abdominal exam  (+) - obese,   Peds  Hematology   Anesthesia Other Findings   Reproductive/Obstetrics                            Lab Results  Component Value Date   WBC 7.9 10/17/2018   HGB 13.3 10/17/2018   HCT 39.0 10/17/2018   MCV 95.6 10/17/2018   PLT 281 10/17/2018   Lab Results  Component Value Date   CREATININE 0.80 10/17/2018   BUN 15 10/17/2018   NA 136 10/17/2018   K 4.1 10/17/2018   CL 105 10/17/2018   CO2 19 (L) 07/03/2018   Lab Results  Component Value Date   INR 1.2 10/17/2018   INR 2.55 06/26/2018   INR 2.58 06/25/2018   COVID-19 Labs - Pending  Anesthesia Physical Anesthesia Plan  ASA: III and emergent  Anesthesia Plan: General   Post-op Pain Management:    Induction: Intravenous  PONV Risk Score and Plan: 4 or greater and Ondansetron, Dexamethasone and Treatment may vary due to age or medical condition  Airway Management Planned: Oral ETT  Additional Equipment: Arterial line  Intra-op Plan:   Post-operative Plan: Possible Post-op intubation/ventilation  Informed Consent: I have reviewed the patients History and Physical, chart, labs  and discussed the procedure including the risks, benefits and alternatives for the proposed anesthesia with the patient or authorized representative who has indicated his/her understanding and acceptance.       Plan Discussed with: CRNA  Anesthesia Plan Comments: (COVID Pending)       Anesthesia Quick Evaluation

## 2018-10-17 NOTE — Anesthesia Procedure Notes (Deleted)
Arterial Line Insertion Start/End5/21/2020 11:30 PM, 10/17/2018 11:32 PM Performed by: Effie Berkshire, MD, Alekai Pocock, Kathrin Penner, CRNA, CRNA  Patient location: OOR procedure area. Preanesthetic checklist: patient identified, IV checked and monitors and equipment checked Patient sedated Left, radial was placed Catheter size: 20 G Hand hygiene performed  and maximum sterile barriers used   Attempts: 1 Procedure performed without using ultrasound guided technique. Ultrasound Notes:anatomy identified Following insertion, Biopatch and dressing applied.

## 2018-10-17 NOTE — Progress Notes (Signed)
Pharmacist Code Stroke Response  Notified to mix tPA at 1047 by Dr. Lorraine Lax Delivered tPA to RN at 1054  tPA dose = 75.3 mg  Issues/delays encountered (if applicable): Required two tPA vials, spillage and loss of drug from first  Harvel Quale 10/17/18 11:05 PM

## 2018-10-18 ENCOUNTER — Encounter (HOSPITAL_COMMUNITY): Payer: Self-pay | Admitting: Radiology

## 2018-10-18 ENCOUNTER — Inpatient Hospital Stay (HOSPITAL_COMMUNITY): Payer: Medicare Other

## 2018-10-18 DIAGNOSIS — I361 Nonrheumatic tricuspid (valve) insufficiency: Secondary | ICD-10-CM

## 2018-10-18 DIAGNOSIS — I6602 Occlusion and stenosis of left middle cerebral artery: Secondary | ICD-10-CM

## 2018-10-18 DIAGNOSIS — I10 Essential (primary) hypertension: Secondary | ICD-10-CM

## 2018-10-18 DIAGNOSIS — I63412 Cerebral infarction due to embolism of left middle cerebral artery: Principal | ICD-10-CM

## 2018-10-18 DIAGNOSIS — I351 Nonrheumatic aortic (valve) insufficiency: Secondary | ICD-10-CM

## 2018-10-18 DIAGNOSIS — E785 Hyperlipidemia, unspecified: Secondary | ICD-10-CM

## 2018-10-18 DIAGNOSIS — R1312 Dysphagia, oropharyngeal phase: Secondary | ICD-10-CM | POA: Diagnosis present

## 2018-10-18 DIAGNOSIS — I4891 Unspecified atrial fibrillation: Secondary | ICD-10-CM

## 2018-10-18 DIAGNOSIS — I639 Cerebral infarction, unspecified: Secondary | ICD-10-CM

## 2018-10-18 HISTORY — PX: IR PERCUTANEOUS ART THROMBECTOMY/INFUSION INTRACRANIAL INC DIAG ANGIO: IMG6087

## 2018-10-18 HISTORY — PX: IR CT HEAD LTD: IMG2386

## 2018-10-18 LAB — ECHOCARDIOGRAM COMPLETE BUBBLE STUDY
Height: 65 in
Weight: 2895.96 oz

## 2018-10-18 LAB — POCT I-STAT 7, (LYTES, BLD GAS, ICA,H+H)
Acid-base deficit: 6 mmol/L — ABNORMAL HIGH (ref 0.0–2.0)
Acid-base deficit: 5 mmol/L — ABNORMAL HIGH (ref 0.0–2.0)
Bicarbonate: 19.8 mmol/L — ABNORMAL LOW (ref 20.0–28.0)
Bicarbonate: 19.6 mmol/L — ABNORMAL LOW (ref 20.0–28.0)
Calcium, Ion: 1.16 mmol/L (ref 1.15–1.40)
Calcium, Ion: 1.17 mmol/L (ref 1.15–1.40)
HCT: 29 % — ABNORMAL LOW (ref 36.0–46.0)
HCT: 31 % — ABNORMAL LOW (ref 36.0–46.0)
Hemoglobin: 9.9 g/dL — ABNORMAL LOW (ref 12.0–15.0)
Hemoglobin: 10.5 g/dL — ABNORMAL LOW (ref 12.0–15.0)
O2 Saturation: 97 %
O2 Saturation: 94 %
Patient temperature: 97.3
Patient temperature: 97.6
Potassium: 4.1 mmol/L (ref 3.5–5.1)
Potassium: 3.9 mmol/L (ref 3.5–5.1)
Sodium: 136 mmol/L (ref 135–145)
Sodium: 135 mmol/L (ref 135–145)
TCO2: 21 mmol/L — ABNORMAL LOW (ref 22–32)
TCO2: 21 mmol/L — ABNORMAL LOW (ref 22–32)
pCO2 arterial: 35.9 mmHg (ref 32.0–48.0)
pCO2 arterial: 33 mmHg (ref 32.0–48.0)
pH, Arterial: 7.346 — ABNORMAL LOW (ref 7.350–7.450)
pH, Arterial: 7.38 (ref 7.350–7.450)
pO2, Arterial: 95 mmHg (ref 83.0–108.0)
pO2, Arterial: 69 mmHg — ABNORMAL LOW (ref 83.0–108.0)

## 2018-10-18 LAB — BASIC METABOLIC PANEL
Anion gap: 8 (ref 5–15)
BUN: 13 mg/dL (ref 8–23)
CO2: 20 mmol/L — ABNORMAL LOW (ref 22–32)
Calcium: 7.6 mg/dL — ABNORMAL LOW (ref 8.9–10.3)
Chloride: 108 mmol/L (ref 98–111)
Creatinine, Ser: 0.66 mg/dL (ref 0.44–1.00)
GFR calc Af Amer: >60 mL/min (ref >60)
GFR calc non Af Amer: >60 mL/min (ref >60)
Glucose, Bld: 170 mg/dL — ABNORMAL HIGH (ref 70–99)
Potassium: 3.8 mmol/L (ref 3.5–5.1)
Sodium: 136 mmol/L (ref 135–145)

## 2018-10-18 LAB — CBC WITH DIFFERENTIAL/PLATELET
Abs Immature Granulocytes: 0.03 10*3/uL (ref 0.00–0.07)
Basophils Absolute: 0 10*3/uL (ref 0.0–0.1)
Basophils Relative: 0 %
Eosinophils Absolute: 0 10*3/uL (ref 0.0–0.5)
Eosinophils Relative: 0 %
HCT: 29 % — ABNORMAL LOW (ref 36.0–46.0)
Hemoglobin: 9.2 g/dL — ABNORMAL LOW (ref 12.0–15.0)
Immature Granulocytes: 0 %
Lymphocytes Relative: 10 %
Lymphs Abs: 0.9 10*3/uL (ref 0.7–4.0)
MCH: 29.9 pg (ref 26.0–34.0)
MCHC: 31.7 g/dL (ref 30.0–36.0)
MCV: 94.2 fL (ref 80.0–100.0)
Monocytes Absolute: 0.4 10*3/uL (ref 0.1–1.0)
Monocytes Relative: 4 %
Neutro Abs: 8.4 10*3/uL — ABNORMAL HIGH (ref 1.7–7.7)
Neutrophils Relative %: 86 %
Platelets: 218 10*3/uL (ref 150–400)
RBC: 3.08 MIL/uL — ABNORMAL LOW (ref 3.87–5.11)
RDW: 15.8 % — ABNORMAL HIGH (ref 11.5–15.5)
WBC: 9.8 10*3/uL (ref 4.0–10.5)
nRBC: 0 % (ref 0.0–0.2)

## 2018-10-18 LAB — PHOSPHORUS: Phosphorus: 3.9 mg/dL (ref 2.5–4.6)

## 2018-10-18 LAB — LIPID PANEL
Cholesterol: 113 mg/dL (ref 0–200)
HDL: 44 mg/dL (ref >40)
LDL Cholesterol: 39 mg/dL (ref 0–99)
Total CHOL/HDL Ratio: 2.6 RATIO
Triglycerides: 150 mg/dL — ABNORMAL HIGH (ref <150)
VLDL: 30 mg/dL (ref 0–40)

## 2018-10-18 LAB — HEMOGLOBIN A1C
Hgb A1c MFr Bld: 5.4 % (ref 4.8–5.6)
Mean Plasma Glucose: 108.28 mg/dL

## 2018-10-18 LAB — MAGNESIUM: Magnesium: 1.6 mg/dL — ABNORMAL LOW (ref 1.7–2.4)

## 2018-10-18 LAB — GLUCOSE, CAPILLARY
Glucose-Capillary: 136 mg/dL — ABNORMAL HIGH (ref 70–99)
Glucose-Capillary: 124 mg/dL — ABNORMAL HIGH (ref 70–99)
Glucose-Capillary: 130 mg/dL — ABNORMAL HIGH (ref 70–99)
Glucose-Capillary: 143 mg/dL — ABNORMAL HIGH (ref 70–99)

## 2018-10-18 LAB — TRIGLYCERIDES: Triglycerides: 148 mg/dL (ref <150)

## 2018-10-18 LAB — MRSA PCR SCREENING: MRSA by PCR: NEGATIVE

## 2018-10-18 MED ORDER — FENTANYL CITRATE (PF) 100 MCG/2ML IJ SOLN
INTRAMUSCULAR | Status: DC | PRN
  Administered 2018-10-18 (×2): 50 ug via INTRAVENOUS

## 2018-10-18 MED ORDER — SODIUM CHLORIDE 0.9 % IV SOLN
INTRAVENOUS | Status: DC
  Administered 2018-10-18 – 2018-10-25 (×6): via INTRAVENOUS

## 2018-10-18 MED ORDER — FENTANYL CITRATE (PF) 100 MCG/2ML IJ SOLN: 25.0000 ug | INTRAMUSCULAR | Status: DC | PRN

## 2018-10-18 MED ORDER — IOHEXOL 300 MG/ML  SOLN
156.0000 mL | Freq: Once | INTRAMUSCULAR | Status: AC | PRN
  Administered 2018-10-18: 156 mL via INTRAVENOUS

## 2018-10-18 MED ORDER — METOPROLOL TARTRATE 5 MG/5ML IV SOLN
2.5000 mg | INTRAVENOUS | Status: DC | PRN
  Administered 2018-10-18: 09:00:00 2.5 mg via INTRAVENOUS
  Filled 2018-10-18: qty 5

## 2018-10-18 MED ORDER — FAMOTIDINE IN NACL 20-0.9 MG/50ML-% IV SOLN
20.0000 mg | Freq: Two times a day (BID) | INTRAVENOUS | Status: DC
  Administered 2018-10-18 – 2018-10-25 (×16): 20 mg via INTRAVENOUS
  Filled 2018-10-18 (×17): qty 50

## 2018-10-18 MED ORDER — ORAL CARE MOUTH RINSE
15.0000 mL | OROMUCOSAL | Status: DC
  Administered 2018-10-18 – 2018-10-19 (×15): 15 mL via OROMUCOSAL

## 2018-10-18 MED ORDER — BISACODYL 10 MG RE SUPP: 10.0000 mg | Freq: Every day | RECTAL | Status: DC | PRN

## 2018-10-18 MED ORDER — FENTANYL CITRATE (PF) 100 MCG/2ML IJ SOLN
25.0000 ug | INTRAMUSCULAR | Status: DC | PRN
  Administered 2018-10-18 (×2): 25 ug via INTRAVENOUS
  Filled 2018-10-18: qty 2

## 2018-10-18 MED ORDER — PROPOFOL 1000 MG/100ML IV EMUL
0.0000 ug/kg/min | INTRAVENOUS | Status: DC
  Administered 2018-10-17: 23:00:00 via INTRAVENOUS

## 2018-10-18 MED ORDER — ACETAMINOPHEN 325 MG PO TABS: 650.0000 mg | ORAL_TABLET | ORAL | Status: DC | PRN

## 2018-10-18 MED ORDER — METOPROLOL TARTRATE 5 MG/5ML IV SOLN
5.0000 mg | Freq: Once | INTRAVENOUS | Status: AC
  Administered 2018-10-18: 23:00:00 5 mg via INTRAVENOUS

## 2018-10-18 MED ORDER — CLEVIDIPINE BUTYRATE 0.5 MG/ML IV EMUL
0.0000 mg/h | INTRAVENOUS | Status: AC
  Administered 2018-10-18: 03:00:00 2 mg/h via INTRAVENOUS
  Administered 2018-10-18: 4 mg/h via INTRAVENOUS
  Administered 2018-10-18: 16:00:00 2 mg/h via INTRAVENOUS
  Filled 2018-10-18 (×5): qty 50

## 2018-10-18 MED ORDER — ACETAMINOPHEN 160 MG/5ML PO SOLN
650.0000 mg | ORAL | Status: DC | PRN
  Administered 2018-10-22 – 2018-10-25 (×5): 650 mg
  Filled 2018-10-18 (×6): qty 20.3

## 2018-10-18 MED ORDER — ACETAMINOPHEN 650 MG RE SUPP
650.0000 mg | RECTAL | Status: DC | PRN
  Administered 2018-10-18 – 2018-10-21 (×6): 650 mg via RECTAL
  Filled 2018-10-18 (×6): qty 1

## 2018-10-18 MED ORDER — CHLORHEXIDINE GLUCONATE CLOTH 2 % EX PADS
6.0000 | MEDICATED_PAD | Freq: Every day | CUTANEOUS | Status: DC
  Administered 2018-10-18 – 2018-10-22 (×5): 6 via TOPICAL

## 2018-10-18 MED ORDER — DOCUSATE SODIUM 50 MG/5ML PO LIQD: 100.0000 mg | Freq: Two times a day (BID) | ORAL | Status: DC | PRN

## 2018-10-18 MED ORDER — METOPROLOL TARTRATE 5 MG/5ML IV SOLN
5.0000 mg | INTRAVENOUS | Status: DC | PRN
  Administered 2018-10-18 – 2018-10-21 (×13): 5 mg via INTRAVENOUS
  Filled 2018-10-18 (×11): qty 5

## 2018-10-18 MED ORDER — CHLORHEXIDINE GLUCONATE 0.12% ORAL RINSE (MEDLINE KIT)
15.0000 mL | Freq: Two times a day (BID) | OROMUCOSAL | Status: DC
  Administered 2018-10-18 – 2018-10-19 (×3): 15 mL via OROMUCOSAL

## 2018-10-18 NOTE — Procedures (Signed)
Extubation Procedure Note  Patient Details:   Name: Natalie Gardner DOB: [birth year] MRN: [id number]   Airway Documentation:    Vent end date: 10/18/18 Vent end time: 0900   Evaluation  O2 sats: stable throughout Complications: No apparent complications Patient did tolerate procedure well. Bilateral Breath Sounds: Clear   Patient had good parameters prior to extubation. Shook head yes when asked if she wanted the ET tube out. Good NIF/VC. Post extubation patient unable to say her name, perform IS or cough. Called Dr Maryjean Ka to the room & he said to keep a close eye on her for now.     Kathie Dike 10/18/2018, 9:14 AM

## 2018-10-18 NOTE — Progress Notes (Signed)
Patient ID: Natalie Gardner, female   DOB: 11/09/1945, 73 y.o.   MRN: [id number]   IR Round Note via phone New regulation  L MCA revascularization this am  S/P lt common carotid arteriogram fiollowed by revascularization of occluded Lt MCA M 1 seg with x 3 passes with 79mm x 78mm embotrap device and x 1 pass with 42mm x 40 mm solitaire x retriever achieving a TICI 2b revascularization.  Pt has been extubated per RN Off Precidex since 5-6 am BP staying in 120-140 range  Moves Left upper extr purposefully Bilat lower extremity movement non purposefully No other movement on Right  Sleepy Not communicating  Groin site is clean and dry No bleeding 2+ pulses  Will follow Will inform Dr Estanislado Pandy of status

## 2018-10-18 NOTE — Transfer of Care (Signed)
Immediate Anesthesia Transfer of Care Note  Patient: Natalie Gardner  Procedure(s) Performed: IR WITH ANESTHESIA (N/A )  Patient Location: ICU  Anesthesia Type:General  Level of Consciousness: Patient remains intubated per anesthesia plan  Airway & Oxygen Therapy: Patient remains intubated per anesthesia plan and Patient placed on Ventilator (see vital sign flow sheet for setting)  Post-op Assessment: Report given to RN and Post -op Vital signs reviewed and stable  Post vital signs: Reviewed and stable  Last Vitals:  Vitals Value Taken Time  BP 110/67 10/18/2018  2:30 AM  Temp    Pulse 80 10/18/2018  2:35 AM  Resp 15 10/18/2018  2:32 AM  SpO2 100 % 10/18/2018  2:35 AM  Vitals shown include unvalidated device data.  Last Pain:  Vitals:   10/17/18 2232  TempSrc:   PainSc: 0-No pain         Complications: No apparent anesthesia complications

## 2018-10-18 NOTE — Progress Notes (Signed)
Patient ID: AYDA TANCREDI, female   DOB: 11-04-1945, 73 y.o.   MRN: [id number] INR post treatment Ct brain demonstrates   early ischemic changes in the Lt parietal region with gyral enhancement. Also noted mild to mod degree of  Contrast+ hemorrhage in the left sylvian fissure and the lt parietal convexity. Few associated tiny micro bubbles also noted.  ? Related to vacuum phenomena.related to aspiration .   Distal pulses palpable in the DPs and PTs bilaterally. RT groin soft with hemostasis achieved with an  8 F angioseal device. S.Letticia Bhattacharyya MD

## 2018-10-18 NOTE — Consult Note (Addendum)
NAME:  Natalie Gardner, MRN:  [id number], DOB:  [birth year], LOS: 1 ADMISSION DATE:  10/17/2018, CONSULTATION DATE:  10/18/2018 REFERRING MD:  Dr. Lorraine Lax, CHIEF COMPLAINT:  Stroke  Brief History   36 yoF presenting with sudden onset aphasia and right facial droop at 2100, found to have distal Left M1/ proximal M2 occlusion s/p tPA and EVR with successful revascularization.  Returns to ICU intubated.   History of present illness   HPI obtained from medical chart review as patient is intubated and sedated on mechanical ventilation.   73 year old female with history of Afib on coumadin, dilated cardiomyopathy, HTN, HLD, anemia/ IDA, DM, and GERD who presented with sudden onset aphasia and right facial droop at 2100.  Additionally has had reported nausea and vomiting and diarrhea for 3 days unable to hold down her medications.    She presented as code stroke, afebrile, and normotensive.  CT head initially negative.  Labs noted for INR 1.2, glucose 123, CO2 21, otherwise CMP and CBC relatively normal. CTA head/ neck showed acute distal Left M1/ proximal M2 occlusion.  She was given tPA at 2257.  She was intubated and taken to neuro IR for mechanical thrombectomy with TICI 2b revascularization after 4 passes.  She returns to ICU on mechanical ventilation, PCCM consulted for further management.   Past Medical History  Afib on coumadin, dilated cardiomyopathy, HTN, HLD, anemia/ IDA, DM, GERD  Significant Hospital Events   5/21 Admit PM 5/22 mechanical thrombectomy  Consults:  Neuro IR  Procedures:  5/21 ETT >> 5/21 L Radial Aline  Significant Diagnostic Tests:  5/21 CTH >> 1. Normal aging brain.  2. ASPECTS is 10.  5/21 CTA head/ neck >>  1. Occlusion of distal left M1/proximal M2 superior and inferior sylvian divisions with intermediate collateralization. 2. Patent carotid and vertebral arteries. No dissection, aneurysm, or hemodynamically significant stenosis utilizing NASCET criteria.   Micro Data:  5/21 COVID >> neg 5/22 MRSA PCR >>   Antimicrobials:   Interim history/subjective:   Objective   Blood pressure 123/89, pulse 94, temperature (!) 97.4 F (36.3 C), temperature source Temporal, resp. rate 15, height 5\' 5"  (2.376 m), weight 83.7 kg, SpO2 98 %.    Vent Mode: PRVC FiO2 (%):  [100 %] 100 % Set Rate:  [15 bmp] 15 bmp Vt Set:  [440 mL] 440 mL PEEP:  [5 cmH20] 5 cmH20 Plateau Pressure:  [15 cmH20] 15 cmH20   Intake/Output Summary (Last 24 hours) at 10/18/2018 0234 Last data filed at 10/18/2018 0150 Gross per 24 hour  Intake 1000 ml  Output 650 ml  Net 350 ml   Filed Weights   10/17/18 2233  Weight: 83.7 kg   Examination: propofol held for exam  General:  Critically ill female on MV in NAD HEENT: MM pink/moist, ETT 7.5 at 23, OGT, pupils 4/reactive, anicteric  Neuro: propofol held, some movement of left toes/ left hand, withdrawals in LEs, no movement in RUE  CV: afib, irir, distant heart sounds, right groin dressing dry, soft PULM: even/non-labored, lungs bilaterally coarse GI: soft, ND, +BS Extremities: warm/dry, 1-2+ LE edema  Skin: no rashes   Resolved Hospital Problem list    Assessment & Plan:  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 with TICI 2b revascularization after 4 passes P:  Stroke workup per Neurology MRI brain pending tonight SBP goal 120-140, cleviprex prn  Ongoing neuro exams  Groin/ neurovascular checks s/p removal of right groin sheath TTE ordered CBC at  0500   Acute respiratory insufficiency due to above  P:  Full MV support, PRVC 8 cc/kg CXR- post intubation, earlier noted with ETT/ OGT in satisfactory position, low lung volumes with slight vascular congestion ABG now VAP bundle PAD protocol with propofol/ prn fentanyl, RASS goal 0/-1 SBT/ WUA in am, after MRI is completed  SLP post extubation  Afib on coumadin - INR 1.2 (not been able to keep down meds x 3 days d/t N/V/D) P:  Tele monitoring   Holding home coumadin/ s/p tPA Hold home cardizem  Prn metoprolol if need for sustained HR > 120  At risk for AKI given N/V/D and s/p contrast P:  Trend urinary output BMP at 0500 Replace electrolytes as indicated  HLD P:  lipitor  HTN, hx dilated cardiomyopathy with EF 55% at [facility] 2017 P:  Hold hold lasix, lisinopril TTE per stroke workup  Best practice:  Diet: NPO Pain/Anxiety/Delirium protocol (if indicated): propofol/ prn fentanyl VAP protocol (if indicated): yes DVT prophylaxis: SCD/ s/p tPA GI prophylaxis: Pepcid Glucose control: CBG q 4, add SSI if > 180 Mobility: BR Code Status: Full  Family Communication: No family at bedside Disposition: ICU  Labs   CBC: Recent Labs  Lab 10/17/18 2212 10/17/18 2218  WBC 7.9  --   NEUTROABS 6.0  --   HGB 12.0 13.3  HCT 38.8 39.0  MCV 95.6  --   PLT 281  --     Basic Metabolic Panel: Recent Labs  Lab 10/17/18 2212 10/17/18 2218  NA 135 136  K 4.1 4.1  CL 104 105  CO2 21*  --   GLUCOSE 123* 120*  BUN 14 15  CREATININE 0.88 0.80  CALCIUM 8.9  --    GFR: Estimated Creatinine Clearance: 66.9 mL/min (by C-G formula based on SCr of 0.8 mg/dL). Recent Labs  Lab 10/17/18 2212  WBC 7.9    Liver Function Tests: Recent Labs  Lab 10/17/18 2212  AST 22  ALT 19  ALKPHOS 86  BILITOT 0.5  PROT 6.9  ALBUMIN 3.4*   No results for input(s): LIPASE, AMYLASE in the last 168 hours. No results for input(s): AMMONIA in the last 168 hours.  ABG    Component Value Date/Time   TCO2 21 (L) 10/17/2018 2218     Coagulation Profile: Recent Labs  Lab 10/17/18 2212  INR 1.2    Cardiac Enzymes: No results for input(s): CKTOTAL, CKMB, CKMBINDEX, TROPONINI in the last 168 hours.  HbA1C: HB A1C (BAYER DCA - WAIVED)  Date/Time Value Ref Range Status  07/03/2018 09:36 AM 5.1 <7.0 % Final    Comment:                                          Diabetic Adult            <7.0                                        Healthy Adult        4.3 - 5.7                                                           (  DCCT/NGSP) American Diabetes Association's Summary of Glycemic Recommendations for Adults with Diabetes: Hemoglobin A1c <7.0%. More stringent glycemic goals (A1c <6.0%) may further reduce complications at the cost of increased risk of hypoglycemia.   11/14/2017 09:56 AM 5.9 <7.0 % Final    Comment:                                          Diabetic Adult            <7.0                                       Healthy Adult        4.3 - 5.7                                                           (DCCT/NGSP) American Diabetes Association's Summary of Glycemic Recommendations for Adults with Diabetes: Hemoglobin A1c <7.0%. More stringent glycemic goals (A1c <6.0%) may further reduce complications at the cost of increased risk of hypoglycemia.     CBG: Recent Labs  Lab 10/17/18 2212  GLUCAP 125*    Review of Systems:   unable  Past Medical History  She,  has a past medical history of Anemia, Aortic valve regurgitation, Arthritis, Atrial fibrillation ([place]), Cancer ([place]) (12/2016), Cardiomyopathy ([place]), CHF (congestive heart failure) ([place]), CKD (chronic kidney disease), Colitis, Diverticulosis, Dyspnea, Dysrhythmia, GERD (gastroesophageal reflux disease), GI bleed, Heart murmur, History of hiatal hernia, Hypercholesterolemia, Hyperlipidemia, Hypertension, Insomnia, Osteopenia, Pre-diabetes, Urinary, incontinence, stress female, and Vertigo.   Surgical History    Past Surgical History:  Procedure Laterality Date  . ANKLE SURGERY Right 1997   Fractured ankle  . COLONOSCOPY    . COLONOSCOPY WITH PROPOFOL N/A 02/08/2015   Procedure: COLONOSCOPY WITH PROPOFOL;  Surgeon: Manya Silvas, MD;  Location: [facility] ENDOSCOPY;  Service: Endoscopy;  Laterality: N/A;  . COLONOSCOPY WITH PROPOFOL N/A 08/20/2017   Procedure: COLONOSCOPY WITH PROPOFOL;  Surgeon: Manya Silvas, MD;  Location: [facility] ENDOSCOPY;   Service: Endoscopy;  Laterality: N/A;  . ESOPHAGOGASTRODUODENOSCOPY (EGD) WITH PROPOFOL N/A 08/20/2017   Procedure: ESOPHAGOGASTRODUODENOSCOPY (EGD) WITH PROPOFOL;  Surgeon: Manya Silvas, MD;  Location: [facility] ENDOSCOPY;  Service: Endoscopy;  Laterality: N/A;  . INTRAMEDULLARY (IM) NAIL INTERTROCHANTERIC Left 06/09/2018   Procedure: INTRAMEDULLARY (IM) NAIL INTERTROCHANTRIC;  Surgeon: Thornton Park, MD;  Location: [facility] ORS;  Service: Orthopedics;  Laterality: Left;  . KNEE ARTHROPLASTY Left 02/12/2017   Procedure: COMPUTER ASSISTED TOTAL KNEE ARTHROPLASTY;  Surgeon: Dereck Leep, MD;  Location: [facility] ORS;  Service: Orthopedics;  Laterality: Left;  . RADIAL HEAD ARTHROPLASTY Left 06/21/2017   Procedure: RADIAL HEAD ARTHROPLASTY;  Surgeon: Corky Mull, MD;  Location: [facility] ORS;  Service: Orthopedics;  Laterality: Left;  . SACROCOCCYGEAL ULCER REMOVAL    . TONSILLECTOMY    . TOTAL KNEE ARTHROPLASTY Left      Social History   reports that she has never smoked. She has never used smokeless tobacco. She reports that she does not drink alcohol or use drugs.   Family History   Her family history includes Cancer in her brother;  Diabetes in her brother and mother; Heart disease in her father and mother; Parkinson's disease in her father.   Allergies Allergies  Allergen Reactions  . Penicillins     Yeast infections Has patient had a PCN reaction causing immediate rash, facial/tongue/throat swelling, SOB or lightheadedness with hypotension: No Has patient had a PCN reaction causing severe rash involving mucus membranes or skin necrosis: No Has patient had a PCN reaction that required hospitalization: No Has patient had a PCN reaction occurring within the last 10 years: No If all of the above answers are "NO", then may proceed with Cephalosporin use.   Marland Kitchen Penicillin V Potassium Other (See Comments)    Yeast infection  . Protonix [Pantoprazole Sodium] Rash  . Sulfa Antibiotics     Yeast  infections  Other reaction(s): Other (See Comments) Yeast infection     Home Medications  Prior to Admission medications   Medication Sig Start Date End Date Taking? Authorizing Provider  acetaminophen (TYLENOL) 650 MG CR tablet Take 1,300 mg by mouth at bedtime.    [provider]  atorvastatin (LIPITOR) 10 MG tablet Take 1 tablet (10 mg total) by mouth at bedtime. 07/03/18   Guadalupe Maple, MD  diltiazem (DILACOR XR) 240 MG 24 hr capsule Take 1 capsule (240 mg total) by mouth daily. 07/03/18   Guadalupe Maple, MD  docusate sodium (COLACE) 100 MG capsule Take 1 capsule (100 mg total) by mouth 2 (two) times daily as needed for mild constipation. 06/11/18   Henreitta Leber, MD  ferrous sulfate 325 (65 FE) MG tablet Take 1 tablet (325 mg total) by mouth 2 (two) times daily with a meal. 11/14/17   Guadalupe Maple, MD  furosemide (LASIX) 20 MG tablet  08/24/18   [provider]  lisinopril (PRINIVIL,ZESTRIL) 10 MG tablet Take 1 tablet (10 mg total) by mouth daily. 07/03/18   Guadalupe Maple, MD  Multiple Vitamin (MULTI-VITAMINS) TABS Take 1 tablet by mouth daily.    [provider]  ranitidine (ZANTAC) 150 MG tablet Take 150 mg by mouth daily.    [provider]  sucralfate (CARAFATE) 1 g tablet Take 1 tablet (1 g total) by mouth 2 (two) times daily. 06/19/18   Gerlene Fee, NP  traZODone (DESYREL) 50 MG tablet Take 0.5-1 tablets (25-50 mg total) by mouth at bedtime as needed for sleep. 06/19/18   Gerlene Fee, NP  venlafaxine XR (EFFEXOR XR) 75 MG 24 hr capsule Take 1 capsule (75 mg total) by mouth daily with breakfast. 07/03/18   Guadalupe Maple, MD  warfarin (COUMADIN) 1 MG tablet Take 1 tablet (1 mg total) by mouth daily. Give with the 6 mg tablet to equal 7 mg 06/21/18   Gerlene Fee, NP  warfarin (COUMADIN) 5 MG tablet Take 5 mg by mouth one time only at 6 PM. Also takes 1.5 tablets (7.5 mg) once weekly.    [provider]  warfarin (COUMADIN)  6 MG tablet Take 1 tablet (6 mg total) by mouth at bedtime. 06/21/18   Gerlene Fee, NP     Critical care time: 12 mins    Kennieth Rad, MSN, AGACNP-BC  Pulmonary & Critical Care Pgr: [phone] or if no answer [phone] 10/18/2018, 3:28 AM    Patient seen and examined, agree with above note.  I dictated the care and orders written for this patient under my direction. CVA s/p tPA and endovascular intervention Stop all sedation after MRI  SBT  and hopefully extubate D/w neurology  CC: [place], [place], [place]

## 2018-10-18 NOTE — Progress Notes (Signed)
Rehab Admissions Coordinator Note:  Patient was screened by Michel Santee, PT, DPT for appropriateness for an Inpatient Acute Rehab Consult.  At this time, note pt with limited ability to participate in PT/OT evaluations.  Will follow at a distance to see whether pt is able to participate and is appropriate for possible CIR before requesting order from MD.   Shann Medal, PT, DPT Admissions Coordinator [phone] 10/18/18  1:32 PM

## 2018-10-18 NOTE — Evaluation (Signed)
Occupational Therapy Evaluation Patient Details Name: Natalie Gardner MRN: [id number] DOB: Dec 06, 1945 Today's Date: 10/18/2018    History of Present Illness Natalie Gardner is a 73 y.o. female with PHMx: A-fib, CHF, CKD, HTN, hyperlipidemia, prediabetes presents to the emergency room with sudden onset aphasia and right facial droop. CT was negative,  stat CT angiogram was performed showed a distal left M1/proximal M2 occlusion. Pt with Left MCA acute ischemic stroke secondary to M1/M2 occlusion status post IV TPA and mechanical thrombectomy with TPA to be recanalization    Clinical Impression   This 73 yo female admitted with above presents to acute OT with decreased arousal, pain response on Bil LEs but not Bil UEs, spontaneous movement of Bil LEs and LUE (but not RUE--slight tone noted in fingers). Pt without eye opening and not really following commands at this point. Limited with eval today due to still needed to keep HOB to only 30 degrees due to IR procedure earlier this AM. We will continue to follow with hopes of pt having increased arousal and participation with hopeful inpatient rehab stay    Follow Up Recommendations  CIR;Supervision/Assistance - 24 hour    Equipment Recommendations  Other (comment)(TBD next venue)       Precautions / Restrictions Precautions Precautions: Fall Restrictions Weight Bearing Restrictions: No      Mobility Bed Mobility               General bed mobility comments: Did not attempt this session due to pt still with orders to slowly raise HOB due to IR procedure eariler this AM         ADL either performed or assessed with clinical judgement   ADL Overall ADL's : Needs assistance/impaired                                       General ADL Comments: Total A for all ADLs at this time     Vision Baseline Vision/History: (unknown, pt cannot state) Patient Visual Report: (unknown, pt cannot state)               Pertinent Vitals/Pain Pain Assessment: Faces Faces Pain Scale: Hurts little more Pain Location: Bil LEs (withdraws to painful stimuli), no withdraw to pain Bil UEs     Hand Dominance Left   Extremity/Trunk Assessment Upper Extremity Assessment Upper Extremity Assessment: RUE deficits/detail;LUE deficits/detail RUE Deficits / Details: PROM WNL, no spontaneous movement noted, fingers are slightly tight--but able to get full extension of digits with wrist at neutral. RUE Sensation: (did not respond to nail bed pressure) LUE Deficits / Details: some minmial spontaneous movement. When PROM of arm up into air for shoulder flexion, she did lower her arm slowly down LUE Sensation: (did not respond to nail bed pressure) LUE Coordination: decreased fine motor;decreased gross motor           Communication Communication Communication: No difficulties   Cognition Arousal/Alertness: Lethargic Behavior During Therapy: Flat affect Overall Cognitive Status: Difficult to assess                                 General Comments: Pt very lethargic despite not being on any meds that would may lead to this. Pt extubated eariler this AM. Unable to sit pt up on EOB at this time to see if increases arousal due  to only able to slowly increase HOB due to internventional radiology earlier today. Pt would not open eyes on command or spontaneously, When eye lids opened for her pt with mainly eyes midline or to left, pupils were reaactive to light. Moving Bil LEs spontaneously and perhaps on command intermittently but with delay and tactile cues as well              Home Living Family/patient expects to be discharged to:: Private residence Living Arrangements: Spouse/significant other Available Help at Discharge: Family;Available 24 hours/day((per RN)--husband, with dtr living near by and working from home at present) Type of Home: House Home Access: Stairs to enter [employer]  of Steps: 1 Entrance Stairs-Rails: None Home Layout: One level     Bathroom Shower/Tub: Walk-in shower;Tub/shower unit   Bathroom Toilet: Handicapped height Bathroom Accessibility: Yes   Home Equipment: Cane - single point;Walker - 2 wheels   Additional Comments: Some information from RN this admission and some information from chart as of 4 months ago      Prior Functioning/Environment Level of Independence: Independent                 OT Problem List: Decreased strength;Decreased range of motion;Impaired balance (sitting and/or standing);Impaired sensation;Decreased safety awareness;Impaired UE functional use;Decreased cognition;Decreased coordination;Impaired vision/perception;Decreased knowledge of use of DME or AE;Impaired tone      OT Treatment/Interventions: Self-care/ADL training;Balance training;Neuromuscular education;Therapeutic activities;Cognitive remediation/compensation;DME and/or AE instruction;Visual/perceptual remediation/compensation;Patient/family education    OT Goals(Current goals can be found in the care plan section) Acute Rehab OT Goals Patient Stated Goal: pt unable to state at this time due to decreased arousal OT Goal Formulation: Patient unable to participate in goal setting Time For Goal Achievement: 11/01/18 Potential to Achieve Goals: Good  OT Frequency: Min 3X/week           Co-evaluation PT/OT/SLP Co-Evaluation/Treatment: Yes Reason for Co-Treatment: Complexity of the patient's impairments (multi-system involvement);For patient/therapist safety;Necessary to address cognition/behavior during functional activity PT goals addressed during session: Strengthening/ROM OT goals addressed during session: Strengthening/ROM      AM-PAC OT "6 Clicks" Daily Activity     Outcome Measure Help from another person eating meals?: Total Help from another person taking care of personal grooming?: Total Help from another person toileting, which  includes using toliet, bedpan, or urinal?: Total Help from another person bathing (including washing, rinsing, drying)?: Total Help from another person to put on and taking off regular upper body clothing?: Total Help from another person to put on and taking off regular lower body clothing?: Total 6 Click Score: 6   End of Session Nurse Communication: (RN in room while we were working with her, RN reported pt was more alert earilier today before they extubated her)  Activity Tolerance: Patient limited by lethargy Patient left: in bed;with call bell/phone within reach(with RUE propped on pillows and left towel roll under the right side of her pillow)  OT Visit Diagnosis: Other abnormalities of gait and mobility (R26.89);Muscle weakness (generalized) (M62.81);Other symptoms and signs involving cognitive function;Hemiplegia and hemiparesis Hemiplegia - Right/Left: Right Hemiplegia - dominant/non-dominant: Non-Dominant Hemiplegia - caused by: Cerebral infarction                Time: 2263-3354 OT Time Calculation (min): 16 min Charges:  OT General Charges $OT Visit: 1 Visit OT Evaluation $OT Eval Moderate Complexity: 1 Mod  [place], OTR/L Acute [employer] Pager [phone] Office [phone]     Almon Register 10/18/2018, 1:09 PM

## 2018-10-18 NOTE — Progress Notes (Signed)
Assisted tele visit to patient with daughter.  Kaige Whistler P, RN  

## 2018-10-18 NOTE — Anesthesia Postprocedure Evaluation (Signed)
Anesthesia Post Note  Patient: Natalie Gardner  Procedure(s) Performed: IR WITH ANESTHESIA (N/A )     Patient location during evaluation: SICU Anesthesia Type: General Level of consciousness: sedated Pain management: pain level controlled Vital Signs Assessment: post-procedure vital signs reviewed and stable Respiratory status: patient remains intubated per anesthesia plan Cardiovascular status: stable Postop Assessment: no apparent nausea or vomiting Anesthetic complications: no    Last Vitals:  Vitals:   10/18/18 0530 10/18/18 0600  BP:    Pulse: (!) 119 96  Resp: (!) 21 (!) 21  Temp: 36.6 C 36.8 C  SpO2: 100% 99%    Last Pain:  Vitals:   10/18/18 0230  TempSrc: Axillary  PainSc:                  Effie Berkshire

## 2018-10-18 NOTE — Progress Notes (Signed)
  Echocardiogram 2D Echocardiogram has been performed.  Natalie Gardner 10/18/2018, 4:02 PM

## 2018-10-18 NOTE — Progress Notes (Signed)
PT Cancellation Note  Patient Details Name: Natalie Gardner MRN: [id number] DOB: 1945/10/08   Cancelled Treatment:    Reason Eval/Treat Not Completed: Patient not medically ready pt s/p revascularization of left MCA, still intubated. Will follow.  Marguarite Arbour A Lashena Signer 10/18/2018, 7:25 AM Wray Kearns, PT, DPT Acute Rehabilitation Services Pager [phone] Office [phone]

## 2018-10-18 NOTE — Evaluation (Signed)
Physical Therapy Evaluation Patient Details Name: Natalie Gardner MRN: [id number] DOB: 12/29/1945 Today's Date: 10/18/2018   History of Present Illness  Natalie Gardner is a 73 y.o. female with PHMx: A-fib, CHF, CKD, HTN, hyperlipidemia, prediabetes presents to the emergency room with sudden onset aphasia and right facial droop. CT was negative,  stat CT angiogram was performed showed a distal left M1/proximal M2 occlusion. Pt with Left MCA acute ischemic stroke secondary to M1/M2 occlusion status post IV TPA and mechanical thrombectomy with TPA to be recanalization   Clinical Impression  Patient presents with right hemiparesis, left gaze preference, decreased arousal and impaired mobility s/p above. Evaluation limited today secondary to precautions from IR procedure with orders to slowly increase HOB. Pt with minimal spontaneous movement to LUE/LE and RLE but no active movement noted through RUE. Withdraws to painful stimulus on BLEs but not BUEs. Not able to arouse fully without trying to sit up EOB. Per RN, pt has support of daughter and spouse at home and was independent PTA. Would benefit from CIR to maximize independence and mobility pending further assessment. Will follow acutely.    Follow Up Recommendations CIR;Supervision for mobility/OOB;Supervision/Assistance - 24 hour    Equipment Recommendations  Other (comment)(defer)    Recommendations for Other Services       Precautions / Restrictions Precautions Precautions: Fall Restrictions Weight Bearing Restrictions: No      Mobility  Bed Mobility               General bed mobility comments: Did not attempt this session due to pt still with orders to slowly raise HOB due to IR procedure eariler this AM.  Transfers                    Ambulation/Gait                Stairs            Wheelchair Mobility    Modified Rankin (Stroke Patients Only) Modified Rankin (Stroke Patients Only) Pre-Morbid  Rankin Score: Slight disability Modified Rankin: Severe disability     Balance                                             Pertinent Vitals/Pain Pain Assessment: Faces Faces Pain Scale: Hurts little more Pain Location: Bil LEs (withdraws to painful stimuli), no withdraw to pain Bil UEs Pain Descriptors / Indicators: Other (Comment)(withdraws) Pain Intervention(s): Monitored during session    Home Living Family/patient expects to be discharged to:: Private residence Living Arrangements: Spouse/significant other Available Help at Discharge: Family;Available 24 hours/day(with spouse and dgth living near by working from home per RN) Type of Home: House Home Access: Stairs to enter Entrance Stairs-Rails: None Entrance Stairs-Number of Steps: 1 Home Layout: One level Home Equipment: [place] - single point;Walker - 2 wheels Additional Comments: Some information from RN this admission and some information from chart as of 4 months ago    Prior Function Level of Independence: Independent               Hand Dominance   Dominant Hand: Left    Extremity/Trunk Assessment   Upper Extremity Assessment Upper Extremity Assessment: Defer to OT evaluation RUE Deficits / Details: PROM WNL, no spontaneous movement noted, fingers are slightly tight--but able to get full extension of digits with wrist at neutral. RUE  Sensation: (did not respond to nail bed pressure) LUE Deficits / Details: some minmial spontaneous movement. When PROM of arm up into air for shoulder flexion, she did lower her arm slowly down LUE Sensation: (did not respond to nail bed pressure) LUE Coordination: decreased fine motor;decreased gross motor    Lower Extremity Assessment Lower Extremity Assessment: LLE deficits/detail;RLE deficits/detail RLE Deficits / Details: Some minimal spontaneous movement but none to command.  RLE Sensation: (withdraws from painful stimulus) LLE Deficits / Details:  Some minimal spontaneous movement but none to command. Rolling ankle.       Communication   Communication: No difficulties  Cognition Arousal/Alertness: Lethargic Behavior During Therapy: Flat affect Overall Cognitive Status: Difficult to assess                                 General Comments: Pt very lethargic despite not being on any meds that would may lead to this. Pt extubated eariler this AM. Unable to sit pt up on EOB at this time to see if increases arousal due to only able to slowly increase HOB due to internventional radiology earlier today. Pt would not open eyes on command or spontaneously, When eye lids opened for her pt mainly favored eyes at midline or to left, pupils were reaactive to light. Moving Bil LEs spontaneously and perhaps on command intermittently but with delay and tactile cues as well      General Comments General comments (skin integrity, edema, etc.): HR in 100-130s bpm during session.    Exercises     Assessment/Plan    PT Assessment Patient needs continued PT services  PT Problem List Decreased strength;Decreased balance;Decreased cognition;Impaired tone;Decreased range of motion;Decreased mobility;Impaired sensation;Decreased coordination       PT Treatment Interventions DME instruction;Balance training;Functional mobility training;Patient/family education;Gait training;Therapeutic activities;Neuromuscular re-education;Therapeutic exercise;Cognitive remediation    PT Goals (Current goals can be found in the Care Plan section)  Acute Rehab PT Goals Patient Stated Goal: pt unable to state at this time due to decreased arousal PT Goal Formulation: Patient unable to participate in goal setting Time For Goal Achievement: 11/01/18 Potential to Achieve Goals: Fair    Frequency Min 4X/week   Barriers to discharge        Co-evaluation PT/OT/SLP Co-Evaluation/Treatment: Yes Reason for Co-Treatment: Complexity of the patient's  impairments (multi-system involvement);Necessary to address cognition/behavior during functional activity;For patient/therapist safety PT goals addressed during session: Strengthening/ROM OT goals addressed during session: Strengthening/ROM       AM-PAC PT "6 Clicks" Mobility  Outcome Measure Help needed turning from your back to your side while in a flat bed without using bedrails?: A Lot Help needed moving from lying on your back to sitting on the side of a flat bed without using bedrails?: A Lot Help needed moving to and from a bed to a chair (including a wheelchair)?: A Lot Help needed standing up from a chair using your arms (e.g., wheelchair or bedside chair)?: A Lot Help needed to walk in hospital room?: Total Help needed climbing 3-5 steps with a railing? : Total 6 Click Score: 10    End of Session   Activity Tolerance: Patient limited by lethargy Patient left: in bed;with call bell/phone within reach;with bed alarm set Nurse Communication: Mobility status;Need for lift equipment PT Visit Diagnosis: Hemiplegia and hemiparesis;Muscle weakness (generalized) (M62.81) Hemiplegia - Right/Left: Right Hemiplegia - dominant/non-dominant: Non-dominant Hemiplegia - caused by: Cerebral infarction    Time:  6484-7207 PT Time Calculation (min) (ACUTE ONLY): 15 min   Charges:   PT Evaluation $PT Eval Moderate Complexity: 1 Mod          Wray Kearns, PT, DPT Acute Rehabilitation Services Pager [phone] Office [place] 10/18/2018, 2:08 PM

## 2018-10-18 NOTE — Progress Notes (Signed)
Transporting patient to MRI with RT per MD orders. RN will continue to monitor.

## 2018-10-18 NOTE — Procedures (Signed)
S/P lt common carotid arteriogram fiollowed by revascularization of occluded Lt MCA M 1 seg with x 3 passes with 9mm x 30mm embotrap device and x 1 pass with 62mm x 40 mm solitaire x retriever achieving a TICI 2b revascularization.

## 2018-10-18 NOTE — Progress Notes (Signed)
eLink Physician-Brief Progress Note Patient Name: Natalie Gardner DOB: [birth year] MRN: [id number]   Date of Service  10/18/2018  HPI/Events of Note  72/F with hx of afib off coumadin, CHF, CKD, HTN,  sudden onset aphasia and right facial droop at 2100, found to have distal Left M1/ proximal M2 occlusion s/p tPA and revascularization.  Pt kept intubated post procedure.    eICU Interventions  Goal SBP 120-140 as per IR. Cleviprex as needed. Check ABG.  Sedate well with propofol and fentanyl prn.  MRI.  Continue statin.  Hold ASA. Neurology is following. NPO SCD for DVT prophylaxis.  Pepcid for GI prophylaxis.      Intervention Category Evaluation Type: New Patient Evaluation  Elsie Lincoln 10/18/2018, 3:30 AM

## 2018-10-18 NOTE — Progress Notes (Signed)
STROKE TEAM PROGRESS NOTE   SUBJECTIVE (INTERVAL HISTORY) Her RN is at the bedside.  Pt extubated this morning, currently very drowsy sleepy and only open eyes with strong pain stimulation. Still has left gaze and right UE weakness, right facial droop. BLE seems symmetric. MRI showed left MCA infarct with petechial hemorrhage.    OBJECTIVE Vitals:   10/18/18 0530 10/18/18 0600 10/18/18 0630 10/18/18 0700  BP:   94/73 104/60  Pulse: (!) 119 96 (!) 119 (!) 121  Resp: (!) 21 (!) 21 18 20   Temp: 97.9 F (36.6 C) 98.2 F (36.8 C) 98.2 F (36.8 C) 98.4 F (36.9 C)  TempSrc:      SpO2: 100% 99% 100% 100%  Weight:      Height:        CBC:  Recent Labs  Lab 10/17/18 2212  10/18/18 0251 10/18/18 0322 10/18/18 0457  WBC 7.9  --  9.8  --   --   NEUTROABS 6.0  --  8.4*  --   --   HGB 12.0   < > 9.2* 9.9* 10.5*  HCT 38.8   < > 29.0* 29.0* 31.0*  MCV 95.6  --  94.2  --   --   PLT 281  --  218  --   --    < > = values in this interval not displayed.    Basic Metabolic Panel:  Recent Labs  Lab 10/17/18 2212 10/17/18 2218 10/18/18 0251 10/18/18 0322 10/18/18 0457  NA 135 136 136 136 135  K 4.1 4.1 3.8 4.1 3.9  CL 104 105 108  --   --   CO2 21*  --  20*  --   --   GLUCOSE 123* 120* 170*  --   --   BUN 14 15 13   --   --   CREATININE 0.88 0.80 0.66  --   --   CALCIUM 8.9  --  7.6*  --   --   MG  --   --  1.6*  --   --   PHOS  --   --  3.9  --   --     Lipid Panel:     Component Value Date/Time   CHOL 113 10/18/2018 0251   CHOL 156 07/03/2018 0936   TRIG 150 (H) 10/18/2018 0251   TRIG 148 10/18/2018 0251   TRIG 143 07/03/2018 0936   HDL 44 10/18/2018 0251   HDL 50 11/14/2017 1052   CHOLHDL 2.6 10/18/2018 0251   VLDL 30 10/18/2018 0251   VLDL 29 07/03/2018 0936   LDLCALC 39 10/18/2018 0251   LDLCALC 80 11/14/2017 1052   HgbA1c:  Lab Results  Component Value Date   HGBA1C 5.4 10/18/2018   Urine Drug Screen: No results found for: LABOPIA, COCAINSCRNUR, LABBENZ,  AMPHETMU, THCU, LABBARB  Alcohol Level No results found for: ETH  IMAGING  Ct Angio Head W Or Wo Contrast Ct Angio Neck W And/or Wo Contrast  10/17/2018 IMPRESSION:  1. Occlusion of distal left M1/proximal M2 superior and inferior sylvian divisions with intermediate collateralization.  2. Patent carotid and vertebral arteries. No dissection, aneurysm, or hemodynamically significant stenosis utilizing NASCET criteria.   Dg Chest 1 View 10/17/2018  IMPRESSION:  1. ET tube and enteric tube in good position.  2. Lower lung volumes with pulmonary vascular congestion but no overt edema.   Mr Brain Wo Contrast 10/18/2018 IMPRESSION:  1. Moderate acute infarct in the left insula and lateral frontal lobe,  primarily involving cortex.  2. Petechial hemorrhage at the infarct. There is also subarachnoid hemorrhage/contrast that is stable or decreased in extent compared to cone beam CT from yesterday  Ct Head Code Stroke Wo Contrast 10/17/2018 IMPRESSION:  1. Normal aging brain.  2. ASPECTS is 10.   Mr Brain Wo Contrast 10/18/2018 IMPRESSION: 1. Moderate acute infarct in the left insula and lateral frontal lobe, primarily involving cortex. 2. Petechial hemorrhage at the infarct. There is also subarachnoid hemorrhage/contrast that is stable or decreased in extent compared to cone beam CT from yesterday   IR - Cerebral Angiogram and Intervention S/P lt common carotid arteriogram fiollowed by revascularization of occluded Lt MCA M 1 seg with x 3 passes with 75mm x 44mm embotrap device and x 1 pass with 76mm x 40 mm solitaire x retriever achieving a TICI 2b revascularization.  Transthoracic Echocardiogram  Pending  EKG - atrial fibrillation - ventricular response 121 BPM (See cardiology reading for complete details)   PHYSICAL EXAM  Temp:  [97.3 F (36.3 C)-99.5 F (37.5 C)] 99.5 F (37.5 C) (05/22 1000) Pulse Rate:  [41-149] 129 (05/22 1000) Resp:  [15-30] 19 (05/22 1000) BP:  (94-203)/(55-137) 125/55 (05/22 0900) SpO2:  [88 %-100 %] 96 % (05/22 1000) Arterial Line BP: (108-154)/(60-97) 131/72 (05/22 1000) FiO2 (%):  [40 %-100 %] 40 % (05/22 0741) Weight:  [82.1 kg-83.7 kg] 82.1 kg (05/22 0230)  General - Well nourished, well developed, in no apparent distress.  Ophthalmologic - fundi not visualized due to noncooperation.  Cardiovascular - irregularly irregular heart rate and rhythm with RVR.  Neuro - drowsy sleepy and eyes closed. Not open with voice but with painful stimuli. With forced eye opening, PERRL, left gaze preference, barely cross midline. Not blinking to visual threat bilaterally. Right facial droop. Tongue midline in mouth, but not cooperative on protrusion. LUE able to hold briefly against gravity but not following commands. RUE flaccid. BLEs 3-/5 on pain stimulation. B/l babinski positive. DTR 1+. Sensation, coordination and gait not tested.   ASSESSMENT/PLAN [name] is a 73 y.o. female with history of atrial fibrillation on Coumadin (INR 1.2), CHF, cardiomyopathy, hx of aortic valve vegetation, hx of GI bleed, chronic kidney disease, hypertension, hyperlipidemia, prediabetes and recent nausea and diarrhea (unable to hold down meds)  presenting with aphasia, right facial droop and difficulty following commands. She received IV tPA Thursday 10/17/18 at 2315. S/p IR with TICI 2b revascularization.  Stroke:  acute left MCA infarcts - embolic - atrial fibrillation off AC due to diarrhea and vomiting  CT head - Normal aging brain.   MRI head - Moderate acute infarct in the left insula and lateral frontal lobe, primarily involving cortex. Petechial hemorrhage at the infarct.  CTA H&N - occlusion of distal left M1/proximal M2 superior and inferior sylvian divisions with intermediate collateralization.  IR - left M1 occlusion with TICI2b recanalization  2D Echo - pending  Sars Corona Virus 2  - negative  LDL - 39  HgbA1c - 5.4  VTE  prophylaxis - SCDs  Diet - NPO  warfarin daily prior to admission, now on No antithrombotic within 24hr of tPA  Patient counseled to be compliant with her antithrombotic medications  Ongoing aggressive stroke risk factor management  Therapy recommendations:  pending  Disposition:  Pending  Afib on coumadin  On coumadin but off due to recent diarrhea and vomiting  INR 1.2 on admission  Currently afib RVR  On metoprolol IV PRN  Follows with Dr.  Nehemiah Massed at [facility]   May consider switch from coumadin to [place] when appropriate after discussion with pt/family  Hypertension  Blood pressure stable  BP goal 120-140 post procedure for 24 hours . Long-term BP goal normotensive  Hyperlipidemia  Lipid lowering medication PTA:  Lipitor 10 mg daily  LDL 39, goal < 70  Current lipid lowering medication: none due to NPO  Continue statin at discharge  Dysphagia   Extubated this am  Speech to follow  NPO for now  On IVF  Other Stroke Risk Factors  Advanced age  Obesity, Body mass index is 30.12 kg/m., recommend weight loss, diet and exercise as appropriate   CHF  Other Active Problems  Hypomagnesemia - 1.6  Acute blood loss anemia - hemoglobin 13.3-9.2-9.9-10.5   Hospital day # 1  This patient is critically ill due to left MCA stroke status post TPA and thrombectomy, A. fib RVR, dysphagia, chronic anticoagulation and at significant risk of neurological worsening, death form recurrent stroke, hemorrhagic conversion, seizure, heart failure, aspiration pneumonia. This patient's care requires constant monitoring of vital signs, hemodynamics, respiratory and cardiac monitoring, review of multiple databases, neurological assessment, discussion with family, other specialists and medical decision making of high complexity. I spent 40 minutes of neurocritical care time in the care of this patient. I had long discussion with daughter and husband over the phone,  updated pt current condition, treatment plan and potential prognosis. They expressed understanding and appreciation.    Rosalin Hawking, MD PhD Stroke Neurology 10/18/2018 3:24 PM    To contact Stroke Continuity provider, please refer to [email or web address] After hours, contact General Neurology

## 2018-10-18 NOTE — Progress Notes (Signed)
Assisted tele visit to patient with family member.  Terion Hedman Anderson, RN   

## 2018-10-18 NOTE — Progress Notes (Signed)
Spoke with on-call Dr. Lorraine Lax regarding pt sustained elevated HR. New one time order for additional one time dose Lopressor IV 5 mg prior to next PRN time. Pt restless in the bed. Pt has been bladder scanned around 2000 showing 305 ml. Pt has urinated a small-moderate amount, some captured through pure-wick some onto bed pad. Will get pt cleaned and linens changed, and rescan bladder.

## 2018-10-18 NOTE — Progress Notes (Signed)
Patient ID: Natalie Gardner, female   DOB: February 13, 1946, 73 y.o.   MRN: [id number] INR  25 y RT H F LSW ?this pm. New onset of aphasia and RT sided weakness. MRSS of 0. Ct brain NO ICH ASPECTS 10. CTA occluded Lt MCA M 1 seg  Given IV tpa.. Endovascular treatment for revascularization D/W daughter.Procedure,reasons risks and alternatives discussed . Risks of ICH of 10 % .worsenimng neuro deficit,death inability to revascularize vascular injury discussed.Daughter expressed understanding and gave  witnessed consent to proceed with the treatment. S.Mireyah Chervenak MD

## 2018-10-18 NOTE — Progress Notes (Signed)
SLP Cancellation Note  Patient Details Name: Natalie Gardner MRN: [id number] DOB: [birth year]   Cancelled treatment:       Reason Eval/Treat Not Completed: Medical issues which prohibited therapy(Pty currently intubated. SLP will follow up. )  Rahel Carlton I. Hardin Negus, [place], [place] Office number [phone] Pager [place] 10/18/2018, 7:51 AM

## 2018-10-18 NOTE — Evaluation (Signed)
SLP Cancellation Note  Patient Details Name: Natalie Gardner MRN: [id number] DOB: 1946-04-30   Cancelled treatment:       Reason Eval/Treat Not Completed: Medical issues which prohibited therapy;Other (comment)(pt now extubated but not alert enough for po or evaluation, did not open eyes or follow commands, remained asleep despite slp verbal cues to awaken)   Macario Golds 10/18/2018, 11:55 AM   Luanna Salk, [place] SLP [place] Pager [phone] Office [phone]

## 2018-10-18 NOTE — Progress Notes (Signed)
NAME:  Natalie Gardner, MRN:  [id number], DOB:  11/13/1945, LOS: 1 ADMISSION DATE:  10/17/2018, CONSULTATION DATE:  10/18/2018 REFERRING MD:  Dr. Lorraine Lax, CHIEF COMPLAINT:  stroke   Brief History   26 yoF presenting with sudden onset aphasia and right facial droop at 2100, found to have distal Left M1/ proximal M2 occlusion s/p tPA and EVR with successful revascularization.  Returns to ICU intubated.   History of present illness   73 year old female with history of Afib on coumadin, dilated cardiomyopathy, HTN, HLD, anemia/ IDA, DM, and GERD who presented with sudden onset aphasia and right facial droop at 2100.  Additionally has had reported nausea and vomiting and diarrhea for 3 days unable to hold down her medications.    She presented as code stroke, afebrile, and normotensive.  CT head initially negative.  Labs noted for INR 1.2, glucose 123, CO2 21, otherwise CMP and CBC relatively normal. CTA head/ neck showed acute distal Left M1/ proximal M2 occlusion.  She was given tPA at 2257.  She was intubated and taken to neuro IR for mechanical thrombectomy with TICI 2b revascularization after 4 passes.  She returns to ICU on mechanical ventilation, PCCM consulted for further management.   Past Medical History  Afib on coumadin, dilated cardiomyopathy, HTN, HLD, anemia/ IDA, DM, GERD  Significant Hospital Events   5/21 Admit PM 5/22 mechanical thrombectomy  Consults:  IR PCCM  Procedures:  5/21 ETT 5/21 a-line 5/22 L M1 thrombectomy  Significant Diagnostic Tests:  5/21 CTH >> 1. Normal aging brain.  2. ASPECTS is 10.  5/21 CTA head/ neck >>  1. Occlusion of distal left M1/proximal M2 superior and inferior sylvian divisions with intermediate collateralization. 2. Patent carotid and vertebral arteries. No dissection, aneurysm, or hemodynamically significant stenosis utilizing NASCET criteria  Micro Data:  5/21 COVID >> neg 5/22 MRSA PCR >>   Antimicrobials:  n/a   Interim  history/subjective:  S/p thrombectomy early this a.m. on SBT with good ventilation parameters, responsive to questions. Moves left side to request. Min ability on right upper, slightly more lower extrem  Objective   Blood pressure 105/60, pulse 90, temperature 98.8 F (37.1 C), resp. rate 19, height 5\' 5"  (1.651 m), weight 82.1 kg, SpO2 100 %.    Vent Mode: CPAP;PSV FiO2 (%):  [40 %-100 %] 40 % Set Rate:  [15 bmp-18 bmp] 18 bmp Vt Set:  [320 mL-440 mL] 320 mL PEEP:  [5 cmH20] 5 cmH20 Pressure Support:  [5 cmH20] 5 cmH20 Plateau Pressure:  [15 cmH20-16 cmH20] 16 cmH20   Intake/Output Summary (Last 24 hours) at 10/18/2018 0837 Last data filed at 10/18/2018 0800 Gross per 24 hour  Intake 1895.63 ml  Output 950 ml  Net 945.63 ml   Filed Weights   10/17/18 2233 10/18/18 0230  Weight: 83.7 kg 82.1 kg    Examination: General: NAD HENT: AT/[place], intubated Lungs: CTAB Cardiovascular: RRR no r/m/g Abdomen: SND Extremities: no edema, muscle wasting, contracture;  Neuro: CN II-XII grossly intact. Pt. Following requests as above with right side weakness GU: foley  Resolved Hospital Problem list   M1 thrombus  Assessment & Plan:  S/p thrombectomy for L M1 distrib stroke ~9 PM 5/21,  Stroke care:  PLAN: per neurology PT/OT visit this morning while still intubated but will have them return for PT, swallow study after extubation later this morning  HTN: PLAN: On cleviprex, wean as able  PULM:  Stable on SBT; plan extubation trial  Best practice:  Diet: NPO, swallow study after extubation and advance as able Pain/Anxiety/Delirium protocol (if indicated): n/a VAP protocol (if indicated): n/a DVT prophylaxis: SCDs; restart coumadin per neurology GI prophylaxis: HOB >30, PPI Glucose control: n/a Mobility: bed for now, up with PT after extubation Code Status: full Family Communication: will update family later today Disposition: 4N ICU for now  Labs   CBC: Recent Labs  Lab  10/17/18 2212 10/17/18 2218 10/18/18 0251 10/18/18 0322 10/18/18 0457  WBC 7.9  --  9.8  --   --   NEUTROABS 6.0  --  8.4*  --   --   HGB 12.0 13.3 9.2* 9.9* 10.5*  HCT 38.8 39.0 29.0* 29.0* 31.0*  MCV 95.6  --  94.2  --   --   PLT 281  --  218  --   --     Basic Metabolic Panel: Recent Labs  Lab 10/17/18 2212 10/17/18 2218 10/18/18 0251 10/18/18 0322 10/18/18 0457  NA 135 136 136 136 135  K 4.1 4.1 3.8 4.1 3.9  CL 104 105 108  --   --   CO2 21*  --  20*  --   --   GLUCOSE 123* 120* 170*  --   --   BUN 14 15 13   --   --   CREATININE 0.88 0.80 0.66  --   --   CALCIUM 8.9  --  7.6*  --   --   MG  --   --  1.6*  --   --   PHOS  --   --  3.9  --   --    GFR: Estimated Creatinine Clearance: 66.2 mL/min (by C-G formula based on SCr of 0.66 mg/dL). Recent Labs  Lab 10/17/18 2212 10/18/18 0251  WBC 7.9 9.8    Liver Function Tests: Recent Labs  Lab 10/17/18 2212  AST 22  ALT 19  ALKPHOS 86  BILITOT 0.5  PROT 6.9  ALBUMIN 3.4*   No results for input(s): LIPASE, AMYLASE in the last 168 hours. No results for input(s): AMMONIA in the last 168 hours.  ABG    Component Value Date/Time   PHART 7.380 10/18/2018 0457   PCO2ART 33.0 10/18/2018 0457   PO2ART 69.0 (L) 10/18/2018 0457   HCO3 19.6 (L) 10/18/2018 0457   TCO2 21 (L) 10/18/2018 0457   ACIDBASEDEF 5.0 (H) 10/18/2018 0457   O2SAT 94.0 10/18/2018 0457     Coagulation Profile: Recent Labs  Lab 10/17/18 2212  INR 1.2    Cardiac Enzymes: No results for input(s): CKTOTAL, CKMB, CKMBINDEX, TROPONINI in the last 168 hours.  HbA1C: HB A1C (BAYER DCA - WAIVED)  Date/Time Value Ref Range Status  07/03/2018 09:36 AM 5.1 <7.0 % Final    Comment:                                          Diabetic Adult            <7.0                                       Healthy Adult        4.3 - 5.7                                                           (  DCCT/NGSP) American Diabetes Association's Summary of Glycemic  Recommendations for Adults with Diabetes: Hemoglobin A1c <7.0%. More stringent glycemic goals (A1c <6.0%) may further reduce complications at the cost of increased risk of hypoglycemia.   11/14/2017 09:56 AM 5.9 <7.0 % Final    Comment:                                          Diabetic Adult            <7.0                                       Healthy Adult        4.3 - 5.7                                                           (DCCT/NGSP) American Diabetes Association's Summary of Glycemic Recommendations for Adults with Diabetes: Hemoglobin A1c <7.0%. More stringent glycemic goals (A1c <6.0%) may further reduce complications at the cost of increased risk of hypoglycemia.    Hgb A1c MFr Bld  Date/Time Value Ref Range Status  10/18/2018 02:51 AM 5.4 4.8 - 5.6 % Final    Comment:    (NOTE) Pre diabetes:          5.7%-6.4% Diabetes:              >6.4% Glycemic control for   <7.0% adults with diabetes     CBG: Recent Labs  Lab 10/17/18 2212 10/18/18 0810  GLUCAP 125* 136*    Review of Systems:   Unable to obtain, pt intubated  Past Medical History  She,  has a past medical history of Anemia, Aortic valve regurgitation, Arthritis, Atrial fibrillation ([place]), Cancer ([place]) (12/2016), Cardiomyopathy ([place]), CHF (congestive heart failure) ([place]), CKD (chronic kidney disease), Colitis, Diverticulosis, Dyspnea, Dysrhythmia, GERD (gastroesophageal reflux disease), GI bleed, Heart murmur, History of hiatal hernia, Hypercholesterolemia, Hyperlipidemia, Hypertension, Insomnia, Osteopenia, Pre-diabetes, Urinary, incontinence, stress female, and Vertigo.   Surgical History    Past Surgical History:  Procedure Laterality Date  . ANKLE SURGERY Right 1997   Fractured ankle  . COLONOSCOPY    . COLONOSCOPY WITH PROPOFOL N/A 02/08/2015   Procedure: COLONOSCOPY WITH PROPOFOL;  Surgeon: Manya Silvas, MD;  Location: [facility] ENDOSCOPY;  Service: Endoscopy;  Laterality: N/A;  . COLONOSCOPY  WITH PROPOFOL N/A 08/20/2017   Procedure: COLONOSCOPY WITH PROPOFOL;  Surgeon: Manya Silvas, MD;  Location: [facility] ENDOSCOPY;  Service: Endoscopy;  Laterality: N/A;  . ESOPHAGOGASTRODUODENOSCOPY (EGD) WITH PROPOFOL N/A 08/20/2017   Procedure: ESOPHAGOGASTRODUODENOSCOPY (EGD) WITH PROPOFOL;  Surgeon: Manya Silvas, MD;  Location: [facility] ENDOSCOPY;  Service: Endoscopy;  Laterality: N/A;  . INTRAMEDULLARY (IM) NAIL INTERTROCHANTERIC Left 06/09/2018   Procedure: INTRAMEDULLARY (IM) NAIL INTERTROCHANTRIC;  Surgeon: Thornton Park, MD;  Location: [facility] ORS;  Service: Orthopedics;  Laterality: Left;  . KNEE ARTHROPLASTY Left 02/12/2017   Procedure: COMPUTER ASSISTED TOTAL KNEE ARTHROPLASTY;  Surgeon: Dereck Leep, MD;  Location: [facility] ORS;  Service: Orthopedics;  Laterality: Left;  . RADIAL HEAD ARTHROPLASTY Left 06/21/2017   Procedure: RADIAL HEAD ARTHROPLASTY;  Surgeon: Corky Mull,  MD;  Location: [facility] ORS;  Service: Orthopedics;  Laterality: Left;  . SACROCOCCYGEAL ULCER REMOVAL    . TONSILLECTOMY    . TOTAL KNEE ARTHROPLASTY Left      Social History   reports that she has never smoked. She has never used smokeless tobacco. She reports that she does not drink alcohol or use drugs.   Family History   Her family history includes Cancer in her brother; Diabetes in her brother and mother; Heart disease in her father and mother; Parkinson's disease in her father.   Allergies Allergies  Allergen Reactions  . Penicillins     Yeast infections Has patient had a PCN reaction causing immediate rash, facial/tongue/throat swelling, SOB or lightheadedness with hypotension: No Has patient had a PCN reaction causing severe rash involving mucus membranes or skin necrosis: No Has patient had a PCN reaction that required hospitalization: No Has patient had a PCN reaction occurring within the last 10 years: No If all of the above answers are "NO", then may proceed with Cephalosporin use.   Marland Kitchen Penicillin  V Potassium Other (See Comments)    Yeast infection  . Protonix [Pantoprazole Sodium] Rash  . Sulfa Antibiotics     Yeast infections  Other reaction(s): Other (See Comments) Yeast infection     Home Medications  Prior to Admission medications   Medication Sig Start Date End Date Taking? Authorizing Provider  acetaminophen (TYLENOL) 650 MG CR tablet Take 1,300 mg by mouth at bedtime.    [provider]  atorvastatin (LIPITOR) 10 MG tablet Take 1 tablet (10 mg total) by mouth at bedtime. 07/03/18   Guadalupe Maple, MD  diltiazem (DILACOR XR) 240 MG 24 hr capsule Take 1 capsule (240 mg total) by mouth daily. 07/03/18   Guadalupe Maple, MD  docusate sodium (COLACE) 100 MG capsule Take 1 capsule (100 mg total) by mouth 2 (two) times daily as needed for mild constipation. 06/11/18   Henreitta Leber, MD  ferrous sulfate 325 (65 FE) MG tablet Take 1 tablet (325 mg total) by mouth 2 (two) times daily with a meal. 11/14/17   Guadalupe Maple, MD  furosemide (LASIX) 20 MG tablet  08/24/18   [provider]  lisinopril (PRINIVIL,ZESTRIL) 10 MG tablet Take 1 tablet (10 mg total) by mouth daily. 07/03/18   Guadalupe Maple, MD  Multiple Vitamin (MULTI-VITAMINS) TABS Take 1 tablet by mouth daily.    [provider]  ranitidine (ZANTAC) 150 MG tablet Take 150 mg by mouth daily.    [provider]  sucralfate (CARAFATE) 1 g tablet Take 1 tablet (1 g total) by mouth 2 (two) times daily. 06/19/18   Gerlene Fee, NP  traZODone (DESYREL) 50 MG tablet Take 0.5-1 tablets (25-50 mg total) by mouth at bedtime as needed for sleep. 06/19/18   Gerlene Fee, NP  venlafaxine XR (EFFEXOR XR) 75 MG 24 hr capsule Take 1 capsule (75 mg total) by mouth daily with breakfast. 07/03/18   Guadalupe Maple, MD  warfarin (COUMADIN) 1 MG tablet Take 1 tablet (1 mg total) by mouth daily. Give with the 6 mg tablet to equal 7 mg 06/21/18   Gerlene Fee, NP  warfarin (COUMADIN) 5 MG tablet Take 5 mg  by mouth one time only at 6 PM. Also takes 1.5 tablets (7.5 mg) once weekly.    [provider]  warfarin (COUMADIN) 6 MG tablet Take 1 tablet (6 mg total) by mouth at bedtime. 06/21/18  Gerlene Fee, NP     Critical care time: I have independently seen and examined the patient, reviewed data, and developed an assessment and plan. A total of 36 minutes were spent in critical care assessment and medical decision making. This critical care time does not reflect procedure time, or teaching time or supervisory time of PA/NP/Med student/Med Resident, etc but could involve care discussion time.  Bonna Gains, MD PhD 10/18/18 8:50 AM

## 2018-10-19 ENCOUNTER — Inpatient Hospital Stay (HOSPITAL_COMMUNITY): Payer: Medicare Other

## 2018-10-19 LAB — GLUCOSE, CAPILLARY
Glucose-Capillary: 102 mg/dL — ABNORMAL HIGH (ref 70–99)
Glucose-Capillary: 142 mg/dL — ABNORMAL HIGH (ref 70–99)
Glucose-Capillary: 77 mg/dL (ref 70–99)
Glucose-Capillary: 84 mg/dL (ref 70–99)
Glucose-Capillary: 84 mg/dL (ref 70–99)
Glucose-Capillary: 99 mg/dL (ref 70–99)

## 2018-10-19 LAB — CBC WITH DIFFERENTIAL/PLATELET
Abs Immature Granulocytes: 0.04 10*3/uL (ref 0.00–0.07)
Basophils Absolute: 0 10*3/uL (ref 0.0–0.1)
Basophils Relative: 0 %
Eosinophils Absolute: 0 10*3/uL (ref 0.0–0.5)
Eosinophils Relative: 0 %
HCT: 29.9 % — ABNORMAL LOW (ref 36.0–46.0)
Hemoglobin: 9.3 g/dL — ABNORMAL LOW (ref 12.0–15.0)
Immature Granulocytes: 0 %
Lymphocytes Relative: 11 %
Lymphs Abs: 1 10*3/uL (ref 0.7–4.0)
MCH: 29.5 pg (ref 26.0–34.0)
MCHC: 31.1 g/dL (ref 30.0–36.0)
MCV: 94.9 fL (ref 80.0–100.0)
Monocytes Absolute: 0.6 10*3/uL (ref 0.1–1.0)
Monocytes Relative: 7 %
Neutro Abs: 7.9 10*3/uL — ABNORMAL HIGH (ref 1.7–7.7)
Neutrophils Relative %: 82 %
Platelets: 249 10*3/uL (ref 150–400)
RBC: 3.15 MIL/uL — ABNORMAL LOW (ref 3.87–5.11)
RDW: 15.8 % — ABNORMAL HIGH (ref 11.5–15.5)
WBC: 9.6 10*3/uL (ref 4.0–10.5)
nRBC: 0 % (ref 0.0–0.2)

## 2018-10-19 LAB — COMPREHENSIVE METABOLIC PANEL
ALT: 15 U/L (ref 0–44)
AST: 20 U/L (ref 15–41)
Albumin: 2.8 g/dL — ABNORMAL LOW (ref 3.5–5.0)
Alkaline Phosphatase: 75 U/L (ref 38–126)
Anion gap: 10 (ref 5–15)
BUN: 12 mg/dL (ref 8–23)
CO2: 19 mmol/L — ABNORMAL LOW (ref 22–32)
Calcium: 8.1 mg/dL — ABNORMAL LOW (ref 8.9–10.3)
Chloride: 105 mmol/L (ref 98–111)
Creatinine, Ser: 0.69 mg/dL (ref 0.44–1.00)
GFR calc Af Amer: >60 mL/min (ref >60)
GFR calc non Af Amer: >60 mL/min (ref >60)
Glucose, Bld: 128 mg/dL — ABNORMAL HIGH (ref 70–99)
Potassium: 3.3 mmol/L — ABNORMAL LOW (ref 3.5–5.1)
Sodium: 134 mmol/L — ABNORMAL LOW (ref 135–145)
Total Bilirubin: 0.7 mg/dL (ref 0.3–1.2)
Total Protein: 5.7 g/dL — ABNORMAL LOW (ref 6.5–8.1)

## 2018-10-19 LAB — URINALYSIS, COMPLETE (UACMP) WITH MICROSCOPIC
Bacteria, UA: NONE SEEN
Bilirubin Urine: NEGATIVE
Glucose, UA: NEGATIVE mg/dL
Hgb urine dipstick: NEGATIVE
Ketones, ur: 5 mg/dL — AB
Leukocytes,Ua: NEGATIVE
Nitrite: NEGATIVE
Protein, ur: NEGATIVE mg/dL
Specific Gravity, Urine: 1.023 (ref 1.005–1.030)
pH: 5 (ref 5.0–8.0)

## 2018-10-19 LAB — MAGNESIUM: Magnesium: 1.6 mg/dL — ABNORMAL LOW (ref 1.7–2.4)

## 2018-10-19 LAB — POCT I-STAT 7, (LYTES, BLD GAS, ICA,H+H)
Bicarbonate: 22.5 mmol/L (ref 20.0–28.0)
Calcium, Ion: 1.2 mmol/L (ref 1.15–1.40)
HCT: 27 % — ABNORMAL LOW (ref 36.0–46.0)
Hemoglobin: 9.2 g/dL — ABNORMAL LOW (ref 12.0–15.0)
O2 Saturation: 99 %
Patient temperature: 99.3
Potassium: 3.5 mmol/L (ref 3.5–5.1)
Sodium: 137 mmol/L (ref 135–145)
TCO2: 23 mmol/L (ref 22–32)
pCO2 arterial: 30.1 mmHg — ABNORMAL LOW (ref 32.0–48.0)
pH, Arterial: 7.483 — ABNORMAL HIGH (ref 7.350–7.450)
pO2, Arterial: 134 mmHg — ABNORMAL HIGH (ref 83.0–108.0)

## 2018-10-19 MED ORDER — ORAL CARE MOUTH RINSE
15.0000 mL | Freq: Two times a day (BID) | OROMUCOSAL | Status: DC
  Administered 2018-10-20 – 2018-10-29 (×16): 15 mL via OROMUCOSAL

## 2018-10-19 MED ORDER — CLEVIDIPINE BUTYRATE 0.5 MG/ML IV EMUL
0.0000 mg/h | INTRAVENOUS | Status: DC
  Administered 2018-10-19: 08:00:00 6 mg/h via INTRAVENOUS

## 2018-10-19 MED ORDER — CLEVIDIPINE BUTYRATE 0.5 MG/ML IV EMUL: 0.0000 mg/h | INTRAVENOUS | Status: DC

## 2018-10-19 MED ORDER — CHLORHEXIDINE GLUCONATE 0.12 % MT SOLN
15.0000 mL | Freq: Two times a day (BID) | OROMUCOSAL | Status: DC
  Administered 2018-10-19 – 2018-10-25 (×12): 15 mL via OROMUCOSAL
  Filled 2018-10-19 (×11): qty 15

## 2018-10-19 NOTE — Progress Notes (Signed)
RN attempted to insert ng for tube feedings. Met resistance in both nares. Pt nose then began bleeding. MD notified. Stated to try again tomorrow. If unsuccessful will do cortrak.

## 2018-10-19 NOTE — Progress Notes (Signed)
NAME:  Natalie Gardner, MRN:  [id number], DOB:  January 05, 1946, LOS: 2 ADMISSION DATE:  10/17/2018, CONSULTATION DATE:  10/18/2018 REFERRING MD:  Dr. Lorraine Lax, CHIEF COMPLAINT:  stroke   Brief History   73 yoF presenting with sudden onset aphasia and right facial droop at 2100, found to have distal Left M1/ proximal M2 occlusion s/p tPA and EVR with successful revascularization.  Returns to ICU intubated.   History of present illness   73 year old female with history of Afib on coumadin, dilated cardiomyopathy, HTN, HLD, anemia/ IDA, DM, and GERD who presented with sudden onset aphasia and right facial droop at 2100.  Additionally has had reported nausea and vomiting and diarrhea for 3 days unable to hold down her medications.    She presented as code stroke, afebrile, and normotensive.  CT head initially negative.  Labs noted for INR 1.2, glucose 123, CO2 21, otherwise CMP and CBC relatively normal. CTA head/ neck showed acute distal Left M1/ proximal M2 occlusion.  She was given tPA at 2257.  She was intubated and taken to neuro IR for mechanical thrombectomy with TICI 2b revascularization after 4 passes.  She returns to ICU on mechanical ventilation, PCCM consulted for further management.   Past Medical History  Afib on coumadin, dilated cardiomyopathy, HTN, HLD, anemia/ IDA, DM, GERD  Significant Hospital Events   5/21 Admit PM 5/22 mechanical thrombectomy  Consults:  IR PCCM  Procedures:  5/21 ETT 5/21 a-line 5/22 L M1 thrombectomy  Significant Diagnostic Tests:  5/21 CTH >> 1. Normal aging brain.  2. ASPECTS is 10.  5/21 CTA head/ neck >>  1. Occlusion of distal left M1/proximal M2 superior and inferior sylvian divisions with intermediate collateralization. 2. Patent carotid and vertebral arteries. No dissection, aneurysm, or hemodynamically significant stenosis utilizing NASCET criteria  5/22 PM CT: There is a small volume of high density material within the left sylvian  fissure and increased density of the area of stroke in the left lateral frontal lobe which may represent contrast material or subarachnoid hemorrhage. No new area of stroke, mass effect, hydrocephalus, or herniation.  Micro Data:  5/21 COVID >> neg 5/22 MRSA PCR >>   Antimicrobials:  n/a   Interim history/subjective:  S/p thrombectomy early 5/22; extubated with good ventilation parameters, responsive to questions. Decreased interactivity since extubation, sometimes responsive to commands, other times not. Moves left side to request. Min ability on right upper, slightly more lower extrem  Objective   Blood pressure 115/79, pulse (!) 140, temperature 99.3 F (37.4 C), temperature source Axillary, resp. rate (!) 32, height 5\' 5"  (1.651 m), weight 82.1 kg, SpO2 97 %.        Intake/Output Summary (Last 24 hours) at 10/19/2018 0854 Last data filed at 10/19/2018 0800 Gross per 24 hour  Intake 2203.27 ml  Output 700 ml  Net 1503.27 ml   Filed Weights   10/17/18 2233 10/18/18 0230  Weight: 83.7 kg 82.1 kg    Examination: General: NAD HENT: AT/[place],  Lungs: CTAB Cardiovascular: RRR no r/m/g Abdomen: SND Extremities: no edema, muscle wasting, contracture;  Neuro: CN II-VI,  grossly intact. Pt. Withdraws to nox. Stim. On left, not on right. Left toes downgoing, right toes upgoing. Following requests only intermittently. Right side paresis GU: foley  Resolved Hospital Problem list   M1 thrombus  Assessment & Plan:  S/p thrombectomy for L M1 distrib stroke ~9 PM 5/21,  Stroke care:  PLAN: per neurology PT/OT  Should start working on PROM,  etc.   HTN: PLAN: On cleviprex, wean as able  PULM:  CXR with some LLL consolidation, SaO2 stable.  --PLAN: follow serial CBC, fever curve, CXR,   Afib: --Continue metoprolol for rate control, consider transition from cleviprex back to diltiazem if can take PO or NGT placed.  --SCDs for DVT prophylaxis vs restarting coumadin (per IR)   Best practice:  Diet: NPO, swallow study when pt can participate. Will start TF tomorrow if unable to take PO Pain/Anxiety/Delirium protocol (if indicated): n/a VAP protocol (if indicated): n/a DVT prophylaxis: SCDs; restart coumadin per neurology GI prophylaxis: HOB >30, PPI Glucose control: n/a Mobility: bed for now, up with PT after extubation Code Status: full Family Communication: will update family later today Disposition: 4N ICU for now  Labs   CBC: Recent Labs  Lab 10/17/18 2212 10/17/18 2218 10/18/18 0251 10/18/18 0322 10/18/18 0457  WBC 7.9  --  9.8  --   --   NEUTROABS 6.0  --  8.4*  --   --   HGB 12.0 13.3 9.2* 9.9* 10.5*  HCT 38.8 39.0 29.0* 29.0* 31.0*  MCV 95.6  --  94.2  --   --   PLT 281  --  218  --   --     Basic Metabolic Panel: Recent Labs  Lab 10/17/18 2212 10/17/18 2218 10/18/18 0251 10/18/18 0322 10/18/18 0457  NA 135 136 136 136 135  K 4.1 4.1 3.8 4.1 3.9  CL 104 105 108  --   --   CO2 21*  --  20*  --   --   GLUCOSE 123* 120* 170*  --   --   BUN 14 15 13   --   --   CREATININE 0.88 0.80 0.66  --   --   CALCIUM 8.9  --  7.6*  --   --   MG  --   --  1.6*  --   --   PHOS  --   --  3.9  --   --    GFR: Estimated Creatinine Clearance: 66.2 mL/min (by C-G formula based on SCr of 0.66 mg/dL). Recent Labs  Lab 10/17/18 2212 10/18/18 0251  WBC 7.9 9.8    Liver Function Tests: Recent Labs  Lab 10/17/18 2212  AST 22  ALT 19  ALKPHOS 86  BILITOT 0.5  PROT 6.9  ALBUMIN 3.4*   No results for input(s): LIPASE, AMYLASE in the last 168 hours. No results for input(s): AMMONIA in the last 168 hours.  ABG    Component Value Date/Time   PHART 7.380 10/18/2018 0457   PCO2ART 33.0 10/18/2018 0457   PO2ART 69.0 (L) 10/18/2018 0457   HCO3 19.6 (L) 10/18/2018 0457   TCO2 21 (L) 10/18/2018 0457   ACIDBASEDEF 5.0 (H) 10/18/2018 0457   O2SAT 94.0 10/18/2018 0457     Coagulation Profile: Recent Labs  Lab 10/17/18 2212  INR 1.2     Cardiac Enzymes: No results for input(s): CKTOTAL, CKMB, CKMBINDEX, TROPONINI in the last 168 hours.  HbA1C: HB A1C (BAYER DCA - WAIVED)  Date/Time Value Ref Range Status  07/03/2018 09:36 AM 5.1 <7.0 % Final    Comment:                                          Diabetic Adult            <  7.0                                       Healthy Adult        4.3 - 5.7                                                           (DCCT/NGSP) American Diabetes Association's Summary of Glycemic Recommendations for Adults with Diabetes: Hemoglobin A1c <7.0%. More stringent glycemic goals (A1c <6.0%) may further reduce complications at the cost of increased risk of hypoglycemia.   11/14/2017 09:56 AM 5.9 <7.0 % Final    Comment:                                          Diabetic Adult            <7.0                                       Healthy Adult        4.3 - 5.7                                                           (DCCT/NGSP) American Diabetes Association's Summary of Glycemic Recommendations for Adults with Diabetes: Hemoglobin A1c <7.0%. More stringent glycemic goals (A1c <6.0%) may further reduce complications at the cost of increased risk of hypoglycemia.    Hgb A1c MFr Bld  Date/Time Value Ref Range Status  10/18/2018 02:51 AM 5.4 4.8 - 5.6 % Final    Comment:    (NOTE) Pre diabetes:          5.7%-6.4% Diabetes:              >6.4% Glycemic control for   <7.0% adults with diabetes     CBG: Recent Labs  Lab 10/18/18 1151 10/18/18 1609 10/18/18 2242 10/19/18 0057 10/19/18 0810  GLUCAP 124* 130* 143* 142* 102*    Review of Systems:   Unable to obtain, pt intubated  Past Medical History  She,  has a past medical history of Anemia, Aortic valve regurgitation, Arthritis, Atrial fibrillation ([place]), Cancer ([place]) (12/2016), Cardiomyopathy ([place]), CHF (congestive heart failure) ([place]), CKD (chronic kidney disease), Colitis, Diverticulosis, Dyspnea, Dysrhythmia, GERD  (gastroesophageal reflux disease), GI bleed, Heart murmur, History of hiatal hernia, Hypercholesterolemia, Hyperlipidemia, Hypertension, Insomnia, Osteopenia, Pre-diabetes, Urinary, incontinence, stress female, and Vertigo.   Surgical History    Past Surgical History:  Procedure Laterality Date  . ANKLE SURGERY Right 1997   Fractured ankle  . COLONOSCOPY    . COLONOSCOPY WITH PROPOFOL N/A 02/08/2015   Procedure: COLONOSCOPY WITH PROPOFOL;  Surgeon: Manya Silvas, MD;  [facility];  Service: Endoscopy;  Laterality: N/A;  . COLONOSCOPY WITH PROPOFOL N/A 08/20/2017   Procedure: COLONOSCOPY WITH PROPOFOL;  Surgeon: Manya Silvas, MD;  Location:  [facility] ENDOSCOPY;  Service: Endoscopy;  Laterality: N/A;  . ESOPHAGOGASTRODUODENOSCOPY (EGD) WITH PROPOFOL N/A 08/20/2017   Procedure: ESOPHAGOGASTRODUODENOSCOPY (EGD) WITH PROPOFOL;  Surgeon: Manya Silvas, MD;  Location[facility] ENDOSCOPY;  Service: Endoscopy;  Laterality: N/A;  . INTRAMEDULLARY (IM) NAIL INTERTROCHANTERIC Left 06/09/2018   Procedure: INTRAMEDULLARY (IM) NAIL INTERTROCHANTRIC;  Surgeon: Thornton Park, MD;  Location: [facility] ORS;  Service: Orthopedics;  Laterality: Left;  . KNEE ARTHROPLASTY Left 02/12/2017   Procedure: COMPUTER ASSISTED TOTAL KNEE ARTHROPLASTY;  Surgeon: Dereck Leep, MD;  Location: [facility] ORS;  Service: Orthopedics;  Laterality: Left;  . RADIAL HEAD ARTHROPLASTY Left 06/21/2017   Procedure: RADIAL HEAD ARTHROPLASTY;  Surgeon: Corky Mull, MD;  Location: [facility] ORS;  Service: Orthopedics;  Laterality: Left;  . RADIOLOGY WITH ANESTHESIA N/A 10/17/2018   Procedure: IR WITH ANESTHESIA;  Surgeon: Luanne Bras, MD;  Location: [place];  Service: Radiology;  Laterality: N/A;  . SACROCOCCYGEAL ULCER REMOVAL    . TONSILLECTOMY    . TOTAL KNEE ARTHROPLASTY Left      Social History   reports that she has never smoked. She has never used smokeless tobacco. She reports that she does not drink alcohol or use  drugs.   Family History   Her family history includes Cancer in her brother; Diabetes in her brother and mother; Heart disease in her father and mother; Parkinson's disease in her father.   Allergies Allergies  Allergen Reactions  . Penicillins     Yeast infections Has patient had a PCN reaction causing immediate rash, facial/tongue/throat swelling, SOB or lightheadedness with hypotension: No Has patient had a PCN reaction causing severe rash involving mucus membranes or skin necrosis: No Has patient had a PCN reaction that required hospitalization: No Has patient had a PCN reaction occurring within the last 10 years: No If all of the above answers are "NO", then may proceed with Cephalosporin use.   Marland Kitchen Penicillin V Potassium Other (See Comments)    Yeast infection  . Protonix [Pantoprazole Sodium] Rash  . Sulfa Antibiotics     Yeast infections  Other reaction(s): Other (See Comments) Yeast infection     Home Medications  Prior to Admission medications   Medication Sig Start Date End Date Taking? Authorizing Provider  acetaminophen (TYLENOL) 650 MG CR tablet Take 1,300 mg by mouth at bedtime.    [provider]  atorvastatin (LIPITOR) 10 MG tablet Take 1 tablet (10 mg total) by mouth at bedtime. 07/03/18   Guadalupe Maple, MD  diltiazem (DILACOR XR) 240 MG 24 hr capsule Take 1 capsule (240 mg total) by mouth daily. 07/03/18   Guadalupe Maple, MD  docusate sodium (COLACE) 100 MG capsule Take 1 capsule (100 mg total) by mouth 2 (two) times daily as needed for mild constipation. 06/11/18   Henreitta Leber, MD  ferrous sulfate 325 (65 FE) MG tablet Take 1 tablet (325 mg total) by mouth 2 (two) times daily with a meal. 11/14/17   Guadalupe Maple, MD  furosemide (LASIX) 20 MG tablet  08/24/18   [provider]  lisinopril (PRINIVIL,ZESTRIL) 10 MG tablet Take 1 tablet (10 mg total) by mouth daily. 07/03/18   Guadalupe Maple, MD  Multiple Vitamin (MULTI-VITAMINS) TABS Take 1  tablet by mouth daily.    [provider]  ranitidine (ZANTAC) 150 MG tablet Take 150 mg by mouth daily.    [provider]  sucralfate (CARAFATE) 1 g tablet Take 1 tablet (1 g total) by mouth 2 (  two) times daily. 06/19/18   Gerlene Fee, NP  traZODone (DESYREL) 50 MG tablet Take 0.5-1 tablets (25-50 mg total) by mouth at bedtime as needed for sleep. 06/19/18   Gerlene Fee, NP  venlafaxine XR (EFFEXOR XR) 75 MG 24 hr capsule Take 1 capsule (75 mg total) by mouth daily with breakfast. 07/03/18   Guadalupe Maple, MD  warfarin (COUMADIN) 1 MG tablet Take 1 tablet (1 mg total) by mouth daily. Give with the 6 mg tablet to equal 7 mg 06/21/18   Gerlene Fee, NP  warfarin (COUMADIN) 5 MG tablet Take 5 mg by mouth one time only at 6 PM. Also takes 1.5 tablets (7.5 mg) once weekly.    [provider]  warfarin (COUMADIN) 6 MG tablet Take 1 tablet (6 mg total) by mouth at bedtime. 06/21/18   Gerlene Fee, NP     Critical care time: I have independently seen and examined the patient, reviewed data, and developed an assessment and plan. A total of 36 minutes were spent in critical care assessment and medical decision making. This critical care time does not reflect procedure time, or teaching time or supervisory time of PA/NP/Med student/Med Resident, etc but could involve care discussion time.  Bonna Gains, MD PhD 10/19/18 8:54 AM

## 2018-10-19 NOTE — Progress Notes (Signed)
Updated daughter. Questions answered. Plan on updating husband tomorrow

## 2018-10-19 NOTE — Progress Notes (Signed)
Physical Therapy Treatment Patient Details Name: Natalie Gardner MRN: [id number] DOB: 1945/07/07 Today's Date: 10/19/2018    History of Present Illness TRUDEE CHIRINO is a 73 y.o. female with PHMx: A-fib, CHF, CKD, HTN, hyperlipidemia, prediabetes presents to the emergency room with sudden onset aphasia and right facial droop. CT was negative,  stat CT angiogram was performed showed a distal left M1/proximal M2 occlusion. Pt with Left MCA acute ischemic stroke secondary to M1/M2 occlusion status post IV TPA and mechanical thrombectomy with TPA to be recanalization     PT Comments     Pt with little progress towards physical therapy goals. Awakens to name and initially following command to squeeze with left hand but becoming more lethargic throughout session with eyes closed. Moving left side some, continues with right sided hemiparesis. Noted increased tone on right side, particularly her right leg. Session focused on placing bed in chair position to promote upright and progressing to trunk unsupported position. Initiated RUE weightbearing. Otherwise limited treatment session due to elevated, variable HR, peak 140 bpm.   Follow Up Recommendations  Other (comment)(Post acute rehabilitation)     Equipment Recommendations  Other (comment)(tbd)    Recommendations for Other Services       Precautions / Restrictions Precautions Precautions: Fall Restrictions Weight Bearing Restrictions: No    Mobility  Bed Mobility Overal bed mobility: Needs Assistance             General bed mobility comments: TotalA for all aspects of bed mobility. Bed placed in Egress position  Transfers                    Ambulation/Gait                 Stairs             Wheelchair Mobility    Modified Rankin (Stroke Patients Only) Modified Rankin (Stroke Patients Only) Pre-Morbid Rankin Score: Slight disability Modified Rankin: Severe disability     Balance                                            Cognition Arousal/Alertness: Lethargic Behavior During Therapy: Flat affect Overall Cognitive Status: Impaired/Different from baseline Area of Impairment: Attention;Following commands                   Current Attention Level: Focused   Following Commands: Follows one step commands inconsistently       General Comments: Pt awakens to name, following command for grip squeeze, otherwise continues to be more lethargic throughout session with eyes closed for majority of time      Exercises Other Exercises Other Exercises: Trunk unsupported position with bed in chair position, with right lateral leans onto elbow x 5    General Comments        Pertinent Vitals/Pain Pain Assessment: Faces Faces Pain Scale: No hurt    Home Living Family/patient expects to be discharged to:: Private residence Living Arrangements: Spouse/significant other Available Help at Discharge: Family;Available 24 hours/day Type of Home: House Home Access: Stairs to enter Entrance Stairs-Rails: None Home Layout: One level Home Equipment: Cane - single point;Walker - 2 wheels Additional Comments: Some information from RN this admission and some information from chart as of 4 months ago    Prior Function  PT Goals (current goals can now be found in the care plan section) Acute Rehab PT Goals Patient Stated Goal: pt unable to state at this time due to decreased arousal PT Goal Formulation: Patient unable to participate in goal setting Time For Goal Achievement: 11/01/18 Potential to Achieve Goals: Fair Progress towards PT goals: Not progressing toward goals - comment(limited by lethargy)    Frequency    Min 3X/week      PT Plan Discharge plan needs to be updated    Co-evaluation              AM-PAC PT "6 Clicks" Mobility   Outcome Measure  Help needed turning from your back to your side while in a flat bed without  using bedrails?: Total Help needed moving from lying on your back to sitting on the side of a flat bed without using bedrails?: Total Help needed moving to and from a bed to a chair (including a wheelchair)?: Total Help needed standing up from a chair using your arms (e.g., wheelchair or bedside chair)?: Total Help needed to walk in hospital room?: Total Help needed climbing 3-5 steps with a railing? : Total 6 Click Score: 6    End of Session   Activity Tolerance: Patient limited by lethargy Patient left: in bed;with call bell/phone within reach;with bed alarm set Nurse Communication: Mobility status PT Visit Diagnosis: Hemiplegia and hemiparesis;Muscle weakness (generalized) (M62.81) Hemiplegia - Right/Left: Right Hemiplegia - dominant/non-dominant: Non-dominant Hemiplegia - caused by: Cerebral infarction     Time: 3646-8032 PT Time Calculation (min) (ACUTE ONLY): 18 min  Charges:  $Therapeutic Activity: 8-22 mins                     Ellamae Sia, PT, DPT Acute Rehabilitation Services Pager [phone] Office [phone]    Willy Eddy 10/19/2018, 5:02 PM

## 2018-10-19 NOTE — Progress Notes (Signed)
STROKE TEAM PROGRESS NOTE   SUBJECTIVE (INTERVAL HISTORY) Her RN is at the bedside.  Pt extubated, currently only open eyes with strong pain stimulation, not following commands, does not track examiner. Still has left gaze and right UE weakness, right facial droop. MRI showed left MCA infarct with petechial hemorrhage. Moving left leg and arm spontaneously. .   OBJECTIVE Vitals:   10/19/18 0500 10/19/18 0600 10/19/18 0700 10/19/18 0800  BP: 114/74 95/70 114/77 115/79  Pulse: (!) 119 95 (!) 135 (!) 140  Resp: (!) 23 18 (!) 26 (!) 32  Temp:    99.3 F (37.4 C)  TempSrc:    Axillary  SpO2: 99% 100% 97% 97%  Weight:      Height:        CBC:  Recent Labs  Lab 10/17/18 2212  10/18/18 0251 10/18/18 0322 10/18/18 0457  WBC 7.9  --  9.8  --   --   NEUTROABS 6.0  --  8.4*  --   --   HGB 12.0   < > 9.2* 9.9* 10.5*  HCT 38.8   < > 29.0* 29.0* 31.0*  MCV 95.6  --  94.2  --   --   PLT 281  --  218  --   --    < > = values in this interval not displayed.    Basic Metabolic Panel:  Recent Labs  Lab 10/17/18 2212 10/17/18 2218 10/18/18 0251 10/18/18 0322 10/18/18 0457  NA 135 136 136 136 135  K 4.1 4.1 3.8 4.1 3.9  CL 104 105 108  --   --   CO2 21*  --  20*  --   --   GLUCOSE 123* 120* 170*  --   --   BUN 14 15 13   --   --   CREATININE 0.88 0.80 0.66  --   --   CALCIUM 8.9  --  7.6*  --   --   MG  --   --  1.6*  --   --   PHOS  --   --  3.9  --   --     Lipid Panel:     Component Value Date/Time   CHOL 113 10/18/2018 0251   CHOL 156 07/03/2018 0936   TRIG 150 (H) 10/18/2018 0251   TRIG 148 10/18/2018 0251   TRIG 143 07/03/2018 0936   HDL 44 10/18/2018 0251   HDL 50 11/14/2017 1052   CHOLHDL 2.6 10/18/2018 0251   VLDL 30 10/18/2018 0251   VLDL 29 07/03/2018 0936   LDLCALC 39 10/18/2018 0251   LDLCALC 80 11/14/2017 1052   HgbA1c:  Lab Results  Component Value Date   HGBA1C 5.4 10/18/2018   Urine Drug Screen: No results found for: LABOPIA, COCAINSCRNUR,  LABBENZ, AMPHETMU, THCU, LABBARB  Alcohol Level No results found for: ETH  IMAGING  Ct Angio Head W Or Wo Contrast Ct Angio Neck W And/or Wo Contrast  10/17/2018 IMPRESSION:  1. Occlusion of distal left M1/proximal M2 superior and inferior sylvian divisions with intermediate collateralization.  2. Patent carotid and vertebral arteries. No dissection, aneurysm, or hemodynamically significant stenosis utilizing NASCET criteria.   Dg Chest 1 View 10/17/2018 IMPRESSION:  1. ET tube and enteric tube in good position.  2. Lower lung volumes with pulmonary vascular congestion but no overt edema.    Dg Chest 1 View 10/19/2018 IMPRESSION:   Status post extubation.  No pneumothorax.  Airspace consolidation left lower lobe, concerning for pneumonia.  Minimal pleural effusions bilaterally.  There is pulmonary vascular congestion. No interstitial edema evident.   Mr Brain Wo Contrast 10/18/2018 IMPRESSION:  1. Moderate acute infarct in the left insula and lateral frontal lobe, primarily involving cortex.  2. Petechial hemorrhage at the infarct. There is also subarachnoid hemorrhage/contrast that is stable or decreased in extent compared to cone beam CT from yesterday  Ct Head Code Stroke Wo Contrast 10/17/2018 IMPRESSION:  1. Normal aging brain.  2. ASPECTS is 10.   Mr Brain Wo Contrast 10/18/2018 IMPRESSION: 1. Moderate acute infarct in the left insula and lateral frontal lobe, primarily involving cortex. 2. Petechial hemorrhage at the infarct. There is also subarachnoid hemorrhage/contrast that is stable or decreased in extent compared to cone beam CT from yesterday   IR - Cerebral Angiogram and Intervention S/P lt common carotid arteriogram fiollowed by revascularization of occluded Lt MCA M 1 seg with x 3 passes with 57mm x 34mm embotrap device and x 1 pass with 63mm x 40 mm solitaire x retriever achieving a TICI 2b revascularization.  Transthoracic Echocardiogram   10/18/2018 IMPRESSIONS  1. The left ventricle has normal systolic function, with an ejection fraction of 55-60%. The cavity size was normal. Left ventricular diastolic Doppler parameters are indeterminate secondary to atrial fibrillation.  2. The right ventricle has normal systolic function. The cavity was normal. There is no increase in right ventricular wall thickness. Right ventricular systolic pressure is moderately elevated with an estimated pressure of 38.0 mmHg.  3. Left atrial size was moderately dilated.  4. No ASD or PFO by color flow Doppler or saline microcavitation study.  5. Right atrial size was mildly dilated.  6. The mitral valve is abnormal. Mild thickening of the anterior mitral valve leaflet. Mild calcification of the anterior mitral valve leaflet. No evidence of mitral valve stenosis.  7. The aortic valve is tricuspid. Mild thickening of the aortic valve. Mild calcification of the aortic valve. Aortic valve regurgitation is mild by color flow Doppler. No stenosis of the aortic valve.  8. The inferior vena cava was dilated in size with <50% respiratory variability.  EKG - atrial fibrillation - ventricular response 121 BPM (See cardiology reading for complete details)   PHYSICAL EXAM  Temp:  [99.1 F (37.3 C)-100.6 F (38.1 C)] 99.3 F (37.4 C) (05/23 0800) Pulse Rate:  [82-140] 140 (05/23 0800) Resp:  [18-32] 32 (05/23 0800) BP: (95-146)/(52-87) 115/79 (05/23 0800) SpO2:  [90 %-100 %] 97 % (05/23 0800) Arterial Line BP: (122-146)/(61-85) 144/77 (05/23 0800)  General - Well nourished, well developed, in no apparent distress.  Ophthalmologic - fundi not visualized due to noncooperation.  Cardiovascular - irregularly irregular heart rate and rhythm with RVR.  Left arm antigravity. Right moves to stim lower. Right upper no movement. Increased tone and upgoing on the right. Pupils sluggish. Right gze pref. Crosses midline but not tracking. Mild right NL flat. Not blink  to threat. No sedation. Mild fever, ordered labs and urine pending xray. In afib, prn metop  Neuro - Not sedated. drowsy sleepy and eyes closed. Not open with voice but with painful stimuli. With forced eye opening, PERRL but sluggish, left gaze preference, crosses midline, not tracking examiner. Not blinking to visual threat bilaterally. Right facial droop. Tongue midline in mouth, but not cooperative on protrusion. Moving left arm and leg spontaneously. No movement right arm. Moves right leg 2/5 with stimulation. Left arm antigravity. Increased tone right arm and leg, right toe up-going. Sensation, coordination and gait not  tested.   ASSESSMENT/PLAN [name] is a 73 y.o. female with history of atrial fibrillation on Coumadin (INR 1.2), CHF, cardiomyopathy, hx of aortic valve vegetation, hx of GI bleed, chronic kidney disease, hypertension, hyperlipidemia, prediabetes and recent nausea and diarrhea (unable to hold down meds)  presenting with aphasia, right facial droop and difficulty following commands. She received IV tPA Thursday 10/17/18 at 2315. S/p IR with TICI 2b revascularization.  Stroke:  acute left MCA infarcts - embolic - atrial fibrillation off AC due to diarrhea and vomiting  CT head - Normal aging brain.   MRI head - Moderate acute infarct in the left insula and lateral frontal lobe, primarily involving cortex. Petechial hemorrhage at the infarct.  CTA H&N - occlusion of distal left M1/proximal M2 superior and inferior sylvian divisions with intermediate collateralization.  IR - left M1 occlusion with TICI2b recanalization  2D Echo - EF 55-60%. No cardiac source of emboli identified.   [employer] Virus 2  - negative  LDL - 39  HgbA1c - 5.4  VTE prophylaxis - SCDs  Diet - NPO  warfarin daily prior to admission, now on No antithrombotic within 24hr of tPA  Patient counseled to be compliant with her antithrombotic medications  Ongoing aggressive stroke risk  factor management  Therapy recommendations:  CIR  Disposition:  Pending  Afib on coumadin  On coumadin but off due to recent diarrhea and vomiting  INR 1.2 on admission  Currently afib RVR  On metoprolol IV PRN  Follows with Dr. Nehemiah Massed at [facility]   May consider switch from coumadin to [place] when appropriate after discussion with pt/family  Hypertension  Blood pressure stable  BP goal 120-140 post procedure for 24 hours . Long-term BP goal normotensive  Hyperlipidemia  Lipid lowering medication PTA: Lipitor 10 mg daily  LDL 39, goal < 70  Current lipid lowering medication: none due to NPO  Continue statin at discharge  Dysphagia   Extubated 10/18/2018 Friday  Speech to follow  NPO for now  On IVF  Other Stroke Risk Factors  Advanced age  Obesity, Body mass index is 30.12 kg/m., recommend weight loss, diet and exercise as appropriate   CHF  Other Active Problems  Magnesium - 1.6 - CCM following, appreciate   Acute blood loss anemia - hemoglobin 13.3-9.2-9.9-10.5  CXR - 10/19/2018 - Airspace consolidation left lower lobe, concerning for pneumonia. (CCM onboard)  Temp 99.3 - U/A and culture pending  Other labs - pending    Hospital day # 2  This patient is critically ill due to left MCA stroke status post TPA and thrombectomy, A. fib RVR, dysphagia, chronic anticoagulation and at significant risk of neurological worsening, death form recurrent stroke, hemorrhagic conversion, seizure, heart failure, aspiration pneumonia. This patient's care requires constant monitoring of vital signs, hemodynamics, respiratory and cardiac monitoring, review of multiple databases, neurological assessment, discussion with family, other specialists and medical decision making of high complexity. I spent 30 minutes of neurocritical care time in the care of this patient. I had long discussion with daughter and husband over the phone, updated pt current condition,  treatment plan and potential prognosis. They expressed understanding and appreciation.       To contact Stroke Continuity provider, please refer to [email or web address] After hours, contact General Neurology

## 2018-10-19 NOTE — Progress Notes (Signed)
Assisted tele visit to patient with daughter.  Jerad Dunlap M, RN  

## 2018-10-19 NOTE — Progress Notes (Signed)
SLP Cancellation Note  Patient Details Name: Natalie Gardner MRN: [id number] DOB: Jun 23, 1945   Cancelled treatment:       Reason Eval/Treat Not Completed: Fatigue/lethargy limiting ability to participate; pt unable to arouse for evaluation; will continue efforts.   Elvina Sidle, M.S., [place] 10/19/2018, 10:50 AM

## 2018-10-20 ENCOUNTER — Inpatient Hospital Stay (HOSPITAL_COMMUNITY): Payer: Medicare Other

## 2018-10-20 LAB — CBC WITH DIFFERENTIAL/PLATELET
Abs Immature Granulocytes: 0.03 10*3/uL (ref 0.00–0.07)
Basophils Absolute: 0 10*3/uL (ref 0.0–0.1)
Basophils Relative: 0 %
Eosinophils Absolute: 0 10*3/uL (ref 0.0–0.5)
Eosinophils Relative: 0 %
HCT: 29 % — ABNORMAL LOW (ref 36.0–46.0)
Hemoglobin: 9.1 g/dL — ABNORMAL LOW (ref 12.0–15.0)
Immature Granulocytes: 0 %
Lymphocytes Relative: 12 %
Lymphs Abs: 1 10*3/uL (ref 0.7–4.0)
MCH: 29.4 pg (ref 26.0–34.0)
MCHC: 31.4 g/dL (ref 30.0–36.0)
MCV: 93.9 fL (ref 80.0–100.0)
Monocytes Absolute: 0.6 10*3/uL (ref 0.1–1.0)
Monocytes Relative: 7 %
Neutro Abs: 6.4 10*3/uL (ref 1.7–7.7)
Neutrophils Relative %: 81 %
Platelets: 196 10*3/uL (ref 150–400)
RBC: 3.09 MIL/uL — ABNORMAL LOW (ref 3.87–5.11)
RDW: 15.7 % — ABNORMAL HIGH (ref 11.5–15.5)
WBC: 8 10*3/uL (ref 4.0–10.5)
nRBC: 0 % (ref 0.0–0.2)

## 2018-10-20 LAB — GLUCOSE, CAPILLARY
Glucose-Capillary: 75 mg/dL (ref 70–99)
Glucose-Capillary: 84 mg/dL (ref 70–99)
Glucose-Capillary: 74 mg/dL (ref 70–99)
Glucose-Capillary: 69 mg/dL — ABNORMAL LOW (ref 70–99)
Glucose-Capillary: 112 mg/dL — ABNORMAL HIGH (ref 70–99)
Glucose-Capillary: 108 mg/dL — ABNORMAL HIGH (ref 70–99)

## 2018-10-20 LAB — BASIC METABOLIC PANEL
Anion gap: 10 (ref 5–15)
BUN: 16 mg/dL (ref 8–23)
CO2: 19 mmol/L — ABNORMAL LOW (ref 22–32)
Calcium: 8.2 mg/dL — ABNORMAL LOW (ref 8.9–10.3)
Chloride: 109 mmol/L (ref 98–111)
Creatinine, Ser: 0.77 mg/dL (ref 0.44–1.00)
GFR calc Af Amer: >60 mL/min (ref >60)
GFR calc non Af Amer: >60 mL/min (ref >60)
Glucose, Bld: 90 mg/dL (ref 70–99)
Potassium: 3.4 mmol/L — ABNORMAL LOW (ref 3.5–5.1)
Sodium: 138 mmol/L (ref 135–145)

## 2018-10-20 LAB — URINE CULTURE: Culture: NO GROWTH

## 2018-10-20 MED ORDER — DEXTROSE 50 % IV SOLN
12.5000 g | INTRAVENOUS | Status: AC
  Administered 2018-10-20: 12.5 g via INTRAVENOUS
  Filled 2018-10-20: qty 50

## 2018-10-20 NOTE — Progress Notes (Addendum)
Updated daughter. Questions answered. Plan on updating husband tomorrow   NAME:  Natalie Gardner, MRN:  [id number], DOB:  07/25/1945, LOS: 3 ADMISSION DATE:  10/17/2018, CONSULTATION DATE:  10/18/2018 REFERRING MD:  Dr. Lorraine Lax, CHIEF COMPLAINT:  stroke   Brief History   79 yoF presenting with sudden onset aphasia and right facial droop at 2100, found to have distal Left M1/ proximal M2 occlusion s/p tPA and EVR with successful revascularization.  Returns to ICU intubated.   History of present illness   73 year old female with history of Afib on coumadin, dilated cardiomyopathy, HTN, HLD, anemia/ IDA, DM, and GERD who presented with sudden onset aphasia and right facial droop at 2100.  Additionally has had reported nausea and vomiting and diarrhea for 3 days unable to hold down her medications.    She presented as code stroke, afebrile, and normotensive.  CT head initially negative.  Labs noted for INR 1.2, glucose 123, CO2 21, otherwise CMP and CBC relatively normal. CTA head/ neck showed acute distal Left M1/ proximal M2 occlusion.  She was given tPA at 2257.  She was intubated and taken to neuro IR for mechanical thrombectomy with TICI 2b revascularization after 4 passes.  She returns to ICU on mechanical ventilation, PCCM consulted for further management.   Past Medical History  Afib on coumadin, dilated cardiomyopathy, HTN, HLD, anemia/ IDA, DM, GERD  Significant Hospital Events   5/21 Admit PM 5/22 mechanical thrombectomy  Consults:  IR PCCM  Procedures:  5/21 ETT 5/21 a-line 5/22 L M1 thrombectomy  Significant Diagnostic Tests:  5/21 CTH >> 1. Normal aging brain.  2. ASPECTS is 10.  5/21 CTA head/ neck >>  1. Occlusion of distal left M1/proximal M2 superior and inferior sylvian divisions with intermediate collateralization. 2. Patent carotid and vertebral arteries. No dissection, aneurysm, or hemodynamically significant stenosis utilizing NASCET criteria  5/22 PM CT: There  is a small volume of high density material within the left sylvian fissure and increased density of the area of stroke in the left lateral frontal lobe which may represent contrast material or subarachnoid hemorrhage. No new area of stroke, mass effect, hydrocephalus, or herniation.  Micro Data:  5/21 COVID >> neg 5/22 MRSA PCR >>   Antimicrobials:  n/a   Interim history/subjective:  S/p thrombectomy early 5/22; extubated with good ventilation parameters, responsive to questions. Decreased interactivity since extubation, sometimes responsive to commands, other times not. Moves left side to request. Min ability on right upper, slightly more lower extrem  Objective   Blood pressure 125/73, pulse (!) 115, temperature 98.4 F (36.9 C), temperature source Axillary, resp. rate (!) 25, height 5\' 5"  (1.651 m), weight 82.1 kg, SpO2 97 %.        Intake/Output Summary (Last 24 hours) at 10/20/2018 1039 Last data filed at 10/20/2018 1000 Gross per 24 hour  Intake 1836.24 ml  Output 660 ml  Net 1176.24 ml   Filed Weights   10/17/18 2233 10/18/18 0230  Weight: 83.7 kg 82.1 kg    Examination: General: NAD HENT: AT/[place],  Lungs: CTAB Cardiovascular: RRR no r/m/g Abdomen: SND Extremities: no edema, muscle wasting, contracture;  Neuro: CN II-VI,  grossly intact. Tracking. Pt. Withdraws to nox. Stim. On left, not on right. Left toes downgoing, right toes upgoing. No ankle clonus.Following requests on left side. Right side paresis GU: foley  Resolved Hospital Problem list   M1 thrombus  Assessment & Plan:  S/p thrombectomy for L M1 distrib stroke ~9 PM 5/21,  Stroke care:  PLAN: per neurology PT/OT  Should start working on PROM, etc.   HTN: PLAN: On cleviprex, wean as able  PULM:  CXR with some LLL consolidation, SaO2 stable.  --PLAN: follow serial CBC, fever curve, CXR,   Afib: --Continue metoprolol for rate control, consider transition from cleviprex back to diltiazem if can  take PO or NGT placed.  --SCDs for DVT prophylaxis vs restarting coumadin (per IR)  OVERALL: patient appears to be improving, no critical care need at this time. Will sign off but remain available.  Best practice:  Diet: NPO, swallow study when pt can participate. Would anticipate placement of cortrack tomorrow Pain/Anxiety/Delirium protocol (if indicated): n/a VAP protocol (if indicated): n/a DVT prophylaxis: SCDs; restart coumadin per neurology GI prophylaxis: HOB >30, PPI Glucose control: n/a Mobility: bed for now, up with PT after extubation Code Status: full Family Communication: will update family later today Disposition: 4N ICU for now  Labs   CBC: Recent Labs  Lab 10/17/18 2212  10/18/18 0251 10/18/18 0322 10/18/18 0457 10/19/18 0846 10/19/18 1051 10/20/18 0214  WBC 7.9  --  9.8  --   --  9.6  --  8.0  NEUTROABS 6.0  --  8.4*  --   --  7.9*  --  6.4  HGB 12.0   < > 9.2* 9.9* 10.5* 9.3* 9.2* 9.1*  HCT 38.8   < > 29.0* 29.0* 31.0* 29.9* 27.0* 29.0*  MCV 95.6  --  94.2  --   --  94.9  --  93.9  PLT 281  --  218  --   --  249  --  196   < > = values in this interval not displayed.    Basic Metabolic Panel: Recent Labs  Lab 10/17/18 2212 10/17/18 2218 10/18/18 0251 10/18/18 0322 10/18/18 0457 10/19/18 0846 10/19/18 1051 10/20/18 0214  NA 135 136 136 136 135 134* 137 138  K 4.1 4.1 3.8 4.1 3.9 3.3* 3.5 3.4*  CL 104 105 108  --   --  105  --  109  CO2 21*  --  20*  --   --  19*  --  19*  GLUCOSE 123* 120* 170*  --   --  128*  --  90  BUN 14 15 13   --   --  12  --  16  CREATININE 0.88 0.80 0.66  --   --  0.69  --  0.77  CALCIUM 8.9  --  7.6*  --   --  8.1*  --  8.2*  MG  --   --  1.6*  --   --  1.6*  --   --   PHOS  --   --  3.9  --   --   --   --   --    GFR: Estimated Creatinine Clearance: 66.2 mL/min (by C-G formula based on SCr of 0.77 mg/dL). Recent Labs  Lab 10/17/18 2212 10/18/18 0251 10/19/18 0846 10/20/18 0214  WBC 7.9 9.8 9.6 8.0     Liver Function Tests: Recent Labs  Lab 10/17/18 2212 10/19/18 0846  AST 22 20  ALT 19 15  ALKPHOS 86 75  BILITOT 0.5 0.7  PROT 6.9 5.7*  ALBUMIN 3.4* 2.8*   No results for input(s): LIPASE, AMYLASE in the last 168 hours. No results for input(s): AMMONIA in the last 168 hours.  ABG    Component Value Date/Time   PHART 7.483 (H) 10/19/2018 1051  PCO2ART 30.1 (L) 10/19/2018 1051   PO2ART 134.0 (H) 10/19/2018 1051   HCO3 22.5 10/19/2018 1051   TCO2 23 10/19/2018 1051   ACIDBASEDEF 5.0 (H) 10/18/2018 0457   O2SAT 99.0 10/19/2018 1051     Coagulation Profile: Recent Labs  Lab 10/17/18 2212  INR 1.2    Cardiac Enzymes: No results for input(s): CKTOTAL, CKMB, CKMBINDEX, TROPONINI in the last 168 hours.  HbA1C: HB A1C (BAYER DCA - WAIVED)  Date/Time Value Ref Range Status  07/03/2018 09:36 AM 5.1 <7.0 % Final    Comment:                                          Diabetic Adult            <7.0                                       Healthy Adult        4.3 - 5.7                                                           (DCCT/NGSP) American Diabetes Association's Summary of Glycemic Recommendations for Adults with Diabetes: Hemoglobin A1c <7.0%. More stringent glycemic goals (A1c <6.0%) may further reduce complications at the cost of increased risk of hypoglycemia.   11/14/2017 09:56 AM 5.9 <7.0 % Final    Comment:                                          Diabetic Adult            <7.0                                       Healthy Adult        4.3 - 5.7                                                           (DCCT/NGSP) American Diabetes Association's Summary of Glycemic Recommendations for Adults with Diabetes: Hemoglobin A1c <7.0%. More stringent glycemic goals (A1c <6.0%) may further reduce complications at the cost of increased risk of hypoglycemia.    Hgb A1c MFr Bld  Date/Time Value Ref Range Status  10/18/2018 02:51 AM 5.4 4.8 - 5.6 % Final    Comment:     (NOTE) Pre diabetes:          5.7%-6.4% Diabetes:              >6.4% Glycemic control for   <7.0% adults with diabetes     CBG: Recent Labs  Lab 10/19/18 1615 10/19/18 1958 10/19/18 2345 10/20/18 0331 10/20/18 0730  GLUCAP 77 84 99 75 84    Review of  Systems:   Unable to obtain, pt intubated  Past Medical History  She,  has a past medical history of Anemia, Aortic valve regurgitation, Arthritis, Atrial fibrillation ([place]), Cancer ([place]) (12/2016), Cardiomyopathy ([place]), CHF (congestive heart failure) ([place]), CKD (chronic kidney disease), Colitis, Diverticulosis, Dyspnea, Dysrhythmia, GERD (gastroesophageal reflux disease), GI bleed, Heart murmur, History of hiatal hernia, Hypercholesterolemia, Hyperlipidemia, Hypertension, Insomnia, Osteopenia, Pre-diabetes, Urinary, incontinence, stress female, and Vertigo.   Surgical History    Past Surgical History:  Procedure Laterality Date  . ANKLE SURGERY Right 1997   Fractured ankle  . COLONOSCOPY    . COLONOSCOPY WITH PROPOFOL N/A 02/08/2015   Procedure: COLONOSCOPY WITH PROPOFOL;  Surgeon: Manya Silvas, MD;  Location[facility] ENDOSCOPY;  Service: Endoscopy;  Laterality: N/A;  . COLONOSCOPY WITH PROPOFOL N/A 08/20/2017   Procedure: COLONOSCOPY WITH PROPOFOL;  Surgeon: Manya Silvas, MD;  Location: [facility] ENDOSCOPY;  Service: Endoscopy;  Laterality: N/A;  . ESOPHAGOGASTRODUODENOSCOPY (EGD) WITH PROPOFOL N/A 08/20/2017   Procedure: ESOPHAGOGASTRODUODENOSCOPY (EGD) WITH PROPOFOL;  Surgeon: Manya Silvas, MD;  Location: [facility] ENDOSCOPY;  Service: Endoscopy;  Laterality: N/A;  . INTRAMEDULLARY (IM) NAIL INTERTROCHANTERIC Left 06/09/2018   Procedure: INTRAMEDULLARY (IM) NAIL INTERTROCHANTRIC;  Surgeon: Thornton Park, MD;  Location: [facility] ORS;  Service: Orthopedics;  Laterality: Left;  . KNEE ARTHROPLASTY Left 02/12/2017   Procedure: COMPUTER ASSISTED TOTAL KNEE ARTHROPLASTY;  Surgeon: Dereck Leep, MD;  Location: [facility] ORS;  Service:  Orthopedics;  Laterality: Left;  . RADIAL HEAD ARTHROPLASTY Left 06/21/2017   Procedure: RADIAL HEAD ARTHROPLASTY;  Surgeon: Corky Mull, MD;  Location: [facility] ORS;  Service: Orthopedics;  Laterality: Left;  . RADIOLOGY WITH ANESTHESIA N/A 10/17/2018   Procedure: IR WITH ANESTHESIA;  Surgeon: Luanne Bras, MD;  Location: [place];  Service: Radiology;  Laterality: N/A;  . SACROCOCCYGEAL ULCER REMOVAL    . TONSILLECTOMY    . TOTAL KNEE ARTHROPLASTY Left      Social History   reports that she has never smoked. She has never used smokeless tobacco. She reports that she does not drink alcohol or use drugs.   Family History   Her family history includes Cancer in her brother; Diabetes in her brother and mother; Heart disease in her father and mother; Parkinson's disease in her father.   Allergies Allergies  Allergen Reactions  . Penicillins     Yeast infections Has patient had a PCN reaction causing immediate rash, facial/tongue/throat swelling, SOB or lightheadedness with hypotension: No Has patient had a PCN reaction causing severe rash involving mucus membranes or skin necrosis: No Has patient had a PCN reaction that required hospitalization: No Has patient had a PCN reaction occurring within the last 10 years: No If all of the above answers are "NO", then may proceed with Cephalosporin use.   Marland Kitchen Penicillin V Potassium Other (See Comments)    Yeast infection  . Protonix [Pantoprazole Sodium] Rash  . Sulfa Antibiotics     Yeast infections  Other reaction(s): Other (See Comments) Yeast infection     Home Medications  Prior to Admission medications   Medication Sig Start Date End Date Taking? Authorizing Provider  acetaminophen (TYLENOL) 650 MG CR tablet Take 1,300 mg by mouth at bedtime.    [provider]  atorvastatin (LIPITOR) 10 MG tablet Take 1 tablet (10 mg total) by mouth at bedtime. 07/03/18   Guadalupe Maple, MD  diltiazem (DILACOR XR) 240 MG 24 hr capsule Take 1  capsule (240 mg total) by mouth daily. 07/03/18  Guadalupe Maple, MD  docusate sodium (COLACE) 100 MG capsule Take 1 capsule (100 mg total) by mouth 2 (two) times daily as needed for mild constipation. 06/11/18   Henreitta Leber, MD  ferrous sulfate 325 (65 FE) MG tablet Take 1 tablet (325 mg total) by mouth 2 (two) times daily with a meal. 11/14/17   Guadalupe Maple, MD  furosemide (LASIX) 20 MG tablet  08/24/18   [provider]  lisinopril (PRINIVIL,ZESTRIL) 10 MG tablet Take 1 tablet (10 mg total) by mouth daily. 07/03/18   Guadalupe Maple, MD  Multiple Vitamin (MULTI-VITAMINS) TABS Take 1 tablet by mouth daily.    [provider]  ranitidine (ZANTAC) 150 MG tablet Take 150 mg by mouth daily.    [provider]  sucralfate (CARAFATE) 1 g tablet Take 1 tablet (1 g total) by mouth 2 (two) times daily. 06/19/18   Gerlene Fee, NP  traZODone (DESYREL) 50 MG tablet Take 0.5-1 tablets (25-50 mg total) by mouth at bedtime as needed for sleep. 06/19/18   Gerlene Fee, NP  venlafaxine XR (EFFEXOR XR) 75 MG 24 hr capsule Take 1 capsule (75 mg total) by mouth daily with breakfast. 07/03/18   Guadalupe Maple, MD  warfarin (COUMADIN) 1 MG tablet Take 1 tablet (1 mg total) by mouth daily. Give with the 6 mg tablet to equal 7 mg 06/21/18   Gerlene Fee, NP  warfarin (COUMADIN) 5 MG tablet Take 5 mg by mouth one time only at 6 PM. Also takes 1.5 tablets (7.5 mg) once weekly.    [provider]  warfarin (COUMADIN) 6 MG tablet Take 1 tablet (6 mg total) by mouth at bedtime. 06/21/18   Gerlene Fee, NP     Critical care time: I have independently seen and examined the patient, reviewed data, and developed an assessment and plan. A total of 36 minutes were spent in critical care assessment and medical decision making. This critical care time does not reflect procedure time, or teaching time or supervisory time of PA/NP/Med student/Med Resident, etc but could involve care  discussion time.  Bonna Gains, MD PhD 10/20/18 10:39 AM

## 2018-10-20 NOTE — Progress Notes (Signed)
SLP Cancellation Note  Patient Details Name: Natalie Gardner MRN: [id number] DOB: January 23, 1946   Cancelled treatment:         Fatigue/lethargy limiting ability to participate; pt unable to arouse for evaluation; will continue efforts.   Charlynne Cousins Wynetta Seith, MA, CCC-SLP 10/20/2018 10:24 AM

## 2018-10-20 NOTE — Progress Notes (Signed)
Brief Nutrition Note RD working remotely.  Consult received for enteral/tube feeding initiation and management.  Noted when reviewing chart patient does not currently have enteral access. NGT placement was attempted yesterday but there was resistance in both nares and nose began bleeding. Discussed with RN. Plan is to wait for Cortrak placement. Notified RN that due to holiday tomorrow next date of Cortrak service is Tuesday 5/26. Discussed that another option is to order placement of NGT under fluoroscopy. Will not enter TF protocol at this time as there is no current enteral access. Full assessment and nutrition recommendations to follow.  Admitting Dx: Stroke ([place]) [I63.9]  Body mass index is 30.12 kg/m. Pt meets criteria for obesity class I based on current BMI.  Labs:  Recent Labs  Lab 10/18/18 0251  10/19/18 0846 10/19/18 1051 10/20/18 0214  NA 136   < > 134* 137 138  K 3.8   < > 3.3* 3.5 3.4*  CL 108  --  105  --  109  CO2 20*  --  19*  --  19*  BUN 13  --  12  --  16  CREATININE 0.66  --  0.69  --  0.77  CALCIUM 7.6*  --  8.1*  --  8.2*  MG 1.6*  --  1.6*  --   --   PHOS 3.9  --   --   --   --   GLUCOSE 170*  --  128*  --  90   < > = values in this interval not displayed.   Willey Blade, MS, RD, LDN Pager: [phone] After Hours/Weekend Pager: [phone]

## 2018-10-20 NOTE — Progress Notes (Signed)
Pt tx to 3w39. Report called to Ala RN. Assessment stable. All belongings sent with pt. Daughter [name] updated on pt location.

## 2018-10-20 NOTE — Progress Notes (Addendum)
STROKE TEAM PROGRESS NOTE   SUBJECTIVE (INTERVAL HISTORY) Her RN is at the bedside. Improved today. Wiggled toes and squeezed hand on command. Not swallowing. Not opening eyes on command but did open on stimulus. Attempted NGtube, patient bled will hold off on ASA due to [facility] and bleeding risk. Not moving right arm to pain but grimaces. Spontaneous on left, moves right lower 2/5 to stim. Slightly opened eyes today. I spoke to both daughter and husband mid afternoon and updated them, answered questions.  OBJECTIVE Vitals:   10/20/18 0900 10/20/18 1000 10/20/18 1100 10/20/18 1200  BP: 123/71 125/73 133/76 (!) 141/69  Pulse: (!) 115  96 (!) 144  Resp: 19 (!) 25 (!) 23 (!) 21  Temp:      TempSrc:      SpO2: 99% 97% 100% 98%  Weight:      Height:        CBC:  Recent Labs  Lab 10/19/18 0846 10/19/18 1051 10/20/18 0214  WBC 9.6  --  8.0  NEUTROABS 7.9*  --  6.4  HGB 9.3* 9.2* 9.1*  HCT 29.9* 27.0* 29.0*  MCV 94.9  --  93.9  PLT 249  --  127    Basic Metabolic Panel:  Recent Labs  Lab 10/18/18 0251  10/19/18 0846 10/19/18 1051 10/20/18 0214  NA 136   < > 134* 137 138  K 3.8   < > 3.3* 3.5 3.4*  CL 108  --  105  --  109  CO2 20*  --  19*  --  19*  GLUCOSE 170*  --  128*  --  90  BUN 13  --  12  --  16  CREATININE 0.66  --  0.69  --  0.77  CALCIUM 7.6*  --  8.1*  --  8.2*  MG 1.6*  --  1.6*  --   --   PHOS 3.9  --   --   --   --    < > = values in this interval not displayed.    Lipid Panel:     Component Value Date/Time   CHOL 113 10/18/2018 0251   CHOL 156 07/03/2018 0936   TRIG 150 (H) 10/18/2018 0251   TRIG 148 10/18/2018 0251   TRIG 143 07/03/2018 0936   HDL 44 10/18/2018 0251   HDL 50 11/14/2017 1052   CHOLHDL 2.6 10/18/2018 0251   VLDL 30 10/18/2018 0251   VLDL 29 07/03/2018 0936   LDLCALC 39 10/18/2018 0251   LDLCALC 80 11/14/2017 1052   HgbA1c:  Lab Results  Component Value Date   HGBA1C 5.4 10/18/2018   Urine Drug Screen: No results found for:  LABOPIA, COCAINSCRNUR, LABBENZ, AMPHETMU, THCU, LABBARB  Alcohol Level No results found for: ETH  IMAGING  Ct Angio Head W Or Wo Contrast Ct Angio Neck W And/or Wo Contrast  10/17/2018 IMPRESSION:  1. Occlusion of distal left M1/proximal M2 superior and inferior sylvian divisions with intermediate collateralization.  2. Patent carotid and vertebral arteries. No dissection, aneurysm, or hemodynamically significant stenosis utilizing NASCET criteria.   Dg Chest 1 View 10/17/2018 IMPRESSION:  1. ET tube and enteric tube in good position.  2. Lower lung volumes with pulmonary vascular congestion but no overt edema.    Dg Chest 1 View 10/19/2018 IMPRESSION:   Status post extubation.  No pneumothorax.  Airspace consolidation left lower lobe, concerning for pneumonia.  Minimal pleural effusions bilaterally.  There is pulmonary vascular congestion. No interstitial edema evident.  Mr Brain Wo Contrast 10/18/2018 IMPRESSION:  1. Moderate acute infarct in the left insula and lateral frontal lobe, primarily involving cortex.  2. Petechial hemorrhage at the infarct. There is also subarachnoid hemorrhage/contrast that is stable or decreased in extent compared to cone beam CT from yesterday  Ct Head Code Stroke Wo Contrast 10/17/2018 IMPRESSION:  1. Normal aging brain.  2. ASPECTS is 10.   Mr Brain Wo Contrast 10/18/2018 IMPRESSION: 1. Moderate acute infarct in the left insula and lateral frontal lobe, primarily involving cortex. 2. Petechial hemorrhage at the infarct. There is also subarachnoid hemorrhage/contrast that is stable or decreased in extent compared to cone beam CT from yesterday   CT Head Wo Contrast 10/20/2018 IMPRESSION: 1. Stable distribution of left MCA infarct involving insula and left lateral frontal lobe. Persistent small volume of high attenuation material within left Sylvian fissure, likely subarachnoid hemorrhage. 2. No new acute intracranial abnormality  identified.   IR - Cerebral Angiogram and Intervention S/P lt common carotid arteriogram fiollowed by revascularization of occluded Lt MCA M 1 seg with x 3 passes with 96mm x 57mm embotrap device and x 1 pass with 50mm x 40 mm solitaire x retriever achieving a TICI 2b revascularization.  Transthoracic Echocardiogram  10/18/2018 IMPRESSIONS  1. The left ventricle has normal systolic function, with an ejection fraction of 55-60%. The cavity size was normal. Left ventricular diastolic Doppler parameters are indeterminate secondary to atrial fibrillation.  2. The right ventricle has normal systolic function. The cavity was normal. There is no increase in right ventricular wall thickness. Right ventricular systolic pressure is moderately elevated with an estimated pressure of 38.0 mmHg.  3. Left atrial size was moderately dilated.  4. No ASD or PFO by color flow Doppler or saline microcavitation study.  5. Right atrial size was mildly dilated.  6. The mitral valve is abnormal. Mild thickening of the anterior mitral valve leaflet. Mild calcification of the anterior mitral valve leaflet. No evidence of mitral valve stenosis.  7. The aortic valve is tricuspid. Mild thickening of the aortic valve. Mild calcification of the aortic valve. Aortic valve regurgitation is mild by color flow Doppler. No stenosis of the aortic valve.  8. The inferior vena cava was dilated in size with <50% respiratory variability.  EKG - atrial fibrillation - ventricular response 121 BPM (See cardiology reading for complete details)   PHYSICAL EXAM  Temp:  [98.4 F (36.9 C)-100.6 F (38.1 C)] 98.4 F (36.9 C) (05/24 0800) Pulse Rate:  [45-150] 144 (05/24 1200) Resp:  [16-32] 21 (05/24 1200) BP: (92-141)/(55-104) 141/69 (05/24 1200) SpO2:  [96 %-100 %] 98 % (05/24 1200) Arterial Line BP: (157-165)/(71-81) 162/71 (05/23 1330)  General - Well nourished, well developed, in no apparent distress.  Ophthalmologic - fundi not  visualized due to noncooperation.  Cardiovascular - irregularly irregular heart rate and rhythm with RVR.  Neuro - Not sedated. drowsy sleepy and eyes closed. Not open with voice but with painful stimuli. PERRL but sluggish, left gaze preference, crosses midline, not tracking examiner. Not blinking to visual threat bilaterally. Right facial droop. Tongue midline in mouth, but not cooperative on protrusion. Not moving right arm to pain but grimaces. Spontaneous on left UE and LE, moves right lower 2/5 to stim. Slightly opened eyes today. Increased tone and upgoing on the right.   ASSESSMENT/PLAN Natalie Gardner is a 74 y.o. female with history of atrial fibrillation on Coumadin (INR 1.2), CHF, cardiomyopathy, hx of aortic valve vegetation, hx of GI  bleed, chronic kidney disease, hypertension, hyperlipidemia, prediabetes and recent nausea and diarrhea (unable to hold down meds)  presenting with aphasia, right facial droop and difficulty following commands. She received IV tPA Thursday 10/17/18 at 2315. S/p IR with TICI 2b revascularization.  Stroke:  acute left MCA infarcts - embolic - atrial fibrillation off AC due to diarrhea and vomiting  MRI head - Moderate acute infarct in the left insula and lateral frontal lobe, primarily involving cortex. Petechial hemorrhage at the infarct.  CTA H&N - occlusion of distal left M1/proximal M2 superior and inferior sylvian divisions with intermediate collateralization.  Head CT 10/20/2018 - Stable distribution of left MCA infarct involving insula and left lateral frontal lobe. Persistent small volume of high attenuation material within left Sylvian fissure, likely subarachnoid hemorrhage.  IR - left M1 occlusion with TICI2b recanalization  2D Echo - EF 55-60%. No cardiac source of emboli identified.   [employer] Virus 2  - negative  LDL - 39  HgbA1c - 5.4  VTE prophylaxis - SCDs  Diet - NPO  warfarin daily prior to admission, now on No  antithrombotic within 24hr of tPA  Patient counseled to be compliant with her antithrombotic medications  Ongoing aggressive stroke risk factor management  Therapy recommendations:  CIR  Disposition:  Pending  Afib on coumadin  On coumadin but off due to recent diarrhea and vomiting  INR 1.2 on admission  Currently afib RVR  On metoprolol IV PRN  Follows with Dr. Nehemiah Massed at [facility]   May consider switch from coumadin to [place] when appropriate after discussion with pt/family  Hypertension  Blood pressure stable  BP goal 120-140 post procedure for 24 hours . Long-term BP goal normotensive  Hyperlipidemia  Lipid lowering medication PTA: Lipitor 10 mg daily  LDL 39, goal < 70  Current lipid lowering medication: none due to NPO  Continue statin at discharge  Dysphagia   Extubated 10/18/2018 Friday  Speech to follow  NPO for now  On IVF  Other Stroke Risk Factors  Advanced age  Obesity, Body mass index is 30.12 kg/m., recommend weight loss, diet and exercise as appropriate   CHF  Other Active Problems  Magnesium - CCM following, appreciate   Acute blood loss anemia - hemoglobin 13.3-9.2-9.9-10.5  CXR - 10/19/2018 - Airspace consolidation left lower lobe, concerning for pneumonia. (CCM onboard)  Temp 99.3->98.4 - U/A and culture no growth (WBCs - 8.0)  Hypokalemia - 3.8->3.3->3.5->3.4  NPO - dietition consulted for tube feeings  Will hold antiplatelet therapy with possible small SAH until the morning due to [facility] and bleeding with NG Tube placement, start ASA in the morning after repeat Ct head.   Anemia - 9.3->9.2->9.1    Hospital day # 3  I spoke to both daughter and husband mid afternoon and updated them, answered questions.  This patient is critically ill due to left MCA stroke status post TPA and thrombectomy, A. fib RVR, dysphagia, chronic anticoagulation and at significant risk of neurological worsening, death form recurrent  stroke, hemorrhagic conversion, seizure, heart failure, aspiration pneumonia. This patient's care requires constant monitoring of vital signs, hemodynamics, respiratory and cardiac monitoring, review of multiple databases, neurological assessment, discussion with family, other specialists and medical decision making of high complexity. I spent 30 minutes of neurocritical care time in the care of this patient. I had long discussion with daughter and husband over the phone, updated pt current condition, treatment plan and potential prognosis. They expressed understanding and appreciation.  To contact Stroke Continuity provider, please refer to [email or web address] After hours, contact General Neurology

## 2018-10-20 NOTE — Progress Notes (Signed)
  Hypoglycemic Event  CBG: 69   Treatment: Dextrose 5%   Follow CBG Result: 112  Possible Reasons for Event: pt is NPO     Natalie Gardner

## 2018-10-21 ENCOUNTER — Encounter (HOSPITAL_COMMUNITY): Payer: Self-pay | Admitting: Cardiology

## 2018-10-21 ENCOUNTER — Inpatient Hospital Stay (HOSPITAL_COMMUNITY): Payer: Medicare Other

## 2018-10-21 DIAGNOSIS — I4891 Unspecified atrial fibrillation: Secondary | ICD-10-CM

## 2018-10-21 LAB — CBC WITH DIFFERENTIAL/PLATELET
Abs Immature Granulocytes: 0.04 10*3/uL (ref 0.00–0.07)
Basophils Absolute: 0 10*3/uL (ref 0.0–0.1)
Basophils Relative: 0 %
Eosinophils Absolute: 0 10*3/uL (ref 0.0–0.5)
Eosinophils Relative: 0 %
HCT: 30.9 % — ABNORMAL LOW (ref 36.0–46.0)
Hemoglobin: 9.9 g/dL — ABNORMAL LOW (ref 12.0–15.0)
Immature Granulocytes: 0 %
Lymphocytes Relative: 8 %
Lymphs Abs: 0.8 10*3/uL (ref 0.7–4.0)
MCH: 29.8 pg (ref 26.0–34.0)
MCHC: 32 g/dL (ref 30.0–36.0)
MCV: 93.1 fL (ref 80.0–100.0)
Monocytes Absolute: 0.8 10*3/uL (ref 0.1–1.0)
Monocytes Relative: 8 %
Neutro Abs: 7.7 10*3/uL (ref 1.7–7.7)
Neutrophils Relative %: 84 %
Platelets: 212 10*3/uL (ref 150–400)
RBC: 3.32 MIL/uL — ABNORMAL LOW (ref 3.87–5.11)
RDW: 15.4 % (ref 11.5–15.5)
WBC: 9.3 10*3/uL (ref 4.0–10.5)
nRBC: 0 % (ref 0.0–0.2)

## 2018-10-21 LAB — GLUCOSE, CAPILLARY
Glucose-Capillary: 73 mg/dL (ref 70–99)
Glucose-Capillary: 80 mg/dL (ref 70–99)
Glucose-Capillary: 80 mg/dL (ref 70–99)
Glucose-Capillary: 91 mg/dL (ref 70–99)
Glucose-Capillary: 98 mg/dL (ref 70–99)

## 2018-10-21 LAB — BASIC METABOLIC PANEL
Anion gap: 13 (ref 5–15)
BUN: 18 mg/dL (ref 8–23)
CO2: 16 mmol/L — ABNORMAL LOW (ref 22–32)
Calcium: 8 mg/dL — ABNORMAL LOW (ref 8.9–10.3)
Chloride: 109 mmol/L (ref 98–111)
Creatinine, Ser: 0.76 mg/dL (ref 0.44–1.00)
GFR calc Af Amer: >60 mL/min (ref >60)
GFR calc non Af Amer: >60 mL/min (ref >60)
Glucose, Bld: 93 mg/dL (ref 70–99)
Potassium: 3.2 mmol/L — ABNORMAL LOW (ref 3.5–5.1)
Sodium: 138 mmol/L (ref 135–145)

## 2018-10-21 LAB — MAGNESIUM: Magnesium: 1.6 mg/dL — ABNORMAL LOW (ref 1.7–2.4)

## 2018-10-21 MED ORDER — JEVITY 1.2 CAL PO LIQD
1000.0000 mL | ORAL | Status: DC
  Administered 2018-10-22 – 2018-10-25 (×3): 1000 mL
  Filled 2018-10-21 (×7): qty 1000

## 2018-10-21 MED ORDER — FUROSEMIDE 10 MG/ML IJ SOLN
40.0000 mg | Freq: Once | INTRAMUSCULAR | Status: AC
  Administered 2018-10-21: 20:00:00 40 mg via INTRAVENOUS
  Filled 2018-10-21: qty 4

## 2018-10-21 MED ORDER — SODIUM CHLORIDE 0.9% FLUSH: 10.0000 mL | INTRAVENOUS | Status: DC | PRN

## 2018-10-21 MED ORDER — MAGNESIUM SULFATE 50 % IJ SOLN
3.0000 g | Freq: Once | INTRAVENOUS | Status: AC
  Administered 2018-10-21: 3 g via INTRAVENOUS
  Filled 2018-10-21: qty 6

## 2018-10-21 MED ORDER — POTASSIUM CHLORIDE 10 MEQ/100ML IV SOLN
10.0000 meq | INTRAVENOUS | Status: AC
  Administered 2018-10-21 (×3): 10 meq via INTRAVENOUS
  Filled 2018-10-21 (×3): qty 100

## 2018-10-21 MED ORDER — DILTIAZEM HCL-DEXTROSE 100-5 MG/100ML-% IV SOLN (PREMIX)
5.0000 mg/h | INTRAVENOUS | Status: DC
  Administered 2018-10-21: 16:00:00 5 mg/h via INTRAVENOUS
  Administered 2018-10-21 – 2018-10-25 (×12): 15 mg/h via INTRAVENOUS
  Filled 2018-10-21 (×13): qty 100

## 2018-10-21 MED ORDER — METOPROLOL TARTRATE 5 MG/5ML IV SOLN
5.0000 mg | INTRAVENOUS | Status: AC | PRN
  Administered 2018-10-21 (×3): 5 mg via INTRAVENOUS
  Filled 2018-10-21 (×3): qty 5

## 2018-10-21 MED ORDER — METOPROLOL TARTRATE 5 MG/5ML IV SOLN
5.0000 mg | Freq: Four times a day (QID) | INTRAVENOUS | Status: DC
  Administered 2018-10-21 – 2018-10-25 (×16): 5 mg via INTRAVENOUS
  Filled 2018-10-21 (×16): qty 5

## 2018-10-21 MED ORDER — FUROSEMIDE 10 MG/ML IJ SOLN
20.0000 mg | Freq: Every day | INTRAMUSCULAR | Status: DC
  Administered 2018-10-21: 18:00:00 20 mg via INTRAVENOUS
  Filled 2018-10-21: qty 4

## 2018-10-21 MED ORDER — SODIUM CHLORIDE 0.9% FLUSH
10.0000 mL | Freq: Two times a day (BID) | INTRAVENOUS | Status: DC
  Administered 2018-10-21 – 2018-10-22 (×3): 10 mL

## 2018-10-21 MED ORDER — POTASSIUM CHLORIDE 10 MEQ/100ML IV SOLN
10.0000 meq | INTRAVENOUS | Status: AC
  Administered 2018-10-21 (×2): 10 meq via INTRAVENOUS
  Filled 2018-10-21 (×2): qty 100

## 2018-10-21 NOTE — Progress Notes (Signed)
I was calle dby nursing due ot persistent ly elevated HR. Still with elevated HR despite maximum diltiazem dose and frequent metolprolol doses. She appears short of breath with fluid overload on CXR, though her sats are good. She denies chest pain.   Given complicated picture with CHF with fluid overload, afib with RVR not responding to diltiazem drip, I have asked cardiology for consultation.  Roland Rack, MD [facility] [phone]  If 7pm- 7am, please page neurology on call as listed in [place].

## 2018-10-21 NOTE — Progress Notes (Signed)
IR rounding note via telephone per new regulations. Spoke with Juliann Pulse, RN.  Patient with history of acute CVA s/p emergent cerebral arteriogram with mechanical thrombectomy of left MCA M1 segment occlusion achieving a TICI 2b revascularization 10/18/2018 by Dr. Estanislado Pandy.  RN states that patient is doing better today- a tad less drowsy.  Patent lethargic but responds to voice. Speech dysarthric- RN states patient "mumbles". PERRL sluggish bilaterally. Demonstrates right facial droop. Tongue midline. Can spontaneously move left extremities, some movements of RLE but cannot lift against gravity, RUE flaccid. Distal pulses 2+ bilaterally. Right groin incision soft without active bleeding or hematoma.  Plans per neurology- appreciate and agree with management. Please call IR with questions/concerns.   Bea Graff Elizandro Laura, PA-C 10/21/2018, 10:56 AM

## 2018-10-21 NOTE — Progress Notes (Signed)
OT Cancellation Note  Patient Details Name: Natalie Gardner MRN: [id number] DOB: [birth year]   Cancelled Treatment:    Reason Eval/Treat Not Completed: Patient not medically ready, HR remains elevated supine in bed (up to 156 bpm per PT).  Will follow.   Delight Stare, OT Acute Rehabilitation Services Pager [phone] Office [phone]   Delight Stare 10/21/2018, 2:49 PM

## 2018-10-21 NOTE — Progress Notes (Signed)
Initial Nutrition Assessment   RD working remotely.   DOCUMENTATION CODES:   Not applicable  INTERVENTION:   Tube Feeding:  Jevity 1.2 at 60 ml/hr  Provides 80 g of protein, 1728 kcals and 1166 mL of free water Meets 100% estimated calorie and protein needs   NUTRITION DIAGNOSIS:   Inadequate oral intake related to acute illness, dysphagia as evidenced by NPO status.  GOAL:   Patient will meet greater than or equal to 90% of their needs  MONITOR:   TF tolerance, Labs, Weight trends, Skin  REASON FOR ASSESSMENT:   Consult Enteral/tube feeding initiation and management  ASSESSMENT:   73 yo female presents with sudden onset of aphasia and right facial droop and found to have distal left M1/proximal M2 occlusion s/p TPA and successful revascularlization, required brief intubation. PMH includes HTN, DM, dilated CM, HLD, GERD,    5/21 Admit, Intubated 5/22 Thrombectomy, Extubated  NPO day 4. SLP following but unable to advance diet.  Spoke with RN who indicates pt is more alert today but still unable to swallow.   Noted order for Cortrak, service not available until Tuesday 5/26. Noted pt does not have enteral access at present, NG tube not placed and pt has not gone to DR for small bore feeding tube RN reports MD considering radiology placement of feeding tube but may just wait for Cortrak tomorrow  Current wt 82.1 kg; noted pt with mild edema in b/l UE  Labs: potassium 3.2 (L), magnesium 1.6 (L), CBGs 80-112 Meds: NS at 75 ml/hr, KCl  NUTRITION - FOCUSED PHYSICAL EXAM:  Unable to assess, working remotely  Diet Order:   Diet Order            Diet NPO time specified  Diet effective now              EDUCATION NEEDS:   Not appropriate for education at this time  Skin:  Skin Assessment: Reviewed RN Assessment  Last BM:  5/25  Height:   Ht Readings from Last 1 Encounters:  10/18/18 5\' 5"  (1.651 m)    Weight:   Wt Readings from Last 1 Encounters:   10/18/18 82.1 kg    Ideal Body Weight:  56.8 kg  BMI:  Body mass index is 30.12 kg/m.  Estimated Nutritional Needs:   Kcal:  1675-1950 kcals   Protein:  80-100 g   Fluid:  >/= 1.7 L   Kerman Passey MS, RD, LDN, CNSC [phone] Pager  [phone] Weekend/On-Call Pager

## 2018-10-21 NOTE — Progress Notes (Signed)
STROKE TEAM PROGRESS NOTE   SUBJECTIVE (INTERVAL HISTORY) Her RN is at the bedside.She is improved as far as alertness, follows commands, nodding appropriately. ASA held yesterday due to bleeding and new SAH with petechial hemorrhage but started today. Unable to place NG Tube due to bleeding during procedure. Awaiting cortrak team in the morning. In the meantime is NPO, cannot swallow, IV metop may need drip to control afib, normal wbcs, afebrile, 100% oxygen saturation.  Started drip for A. fib with RVR.  OBJECTIVE Vitals:   10/21/18 0806 10/21/18 1112 10/21/18 1341 10/21/18 1542  BP:  (!) 149/100 (!) 144/98 111/84  Pulse: (!) 132 (!) 113    Resp: (!) 26 (!) 26 (!) 25   Temp: 99 F (37.2 C) 99.7 F (37.6 C)    TempSrc: Axillary Axillary    SpO2:  100% 100% 100%  Weight:      Height:        CBC:  Recent Labs  Lab 10/20/18 0214 10/21/18 0431  WBC 8.0 9.3  NEUTROABS 6.4 7.7  HGB 9.1* 9.9*  HCT 29.0* 30.9*  MCV 93.9 93.1  PLT 196 631    Basic Metabolic Panel:  Recent Labs  Lab 10/18/18 0251  10/19/18 0846  10/20/18 0214 10/21/18 0431  NA 136   < > 134*   < > 138 138  K 3.8   < > 3.3*   < > 3.4* 3.2*  CL 108  --  105  --  109 109  CO2 20*  --  19*  --  19* 16*  GLUCOSE 170*  --  128*  --  90 93  BUN 13  --  12  --  16 18  CREATININE 0.66  --  0.69  --  0.77 0.76  CALCIUM 7.6*  --  8.1*  --  8.2* 8.0*  MG 1.6*  --  1.6*  --   --  1.6*  PHOS 3.9  --   --   --   --   --    < > = values in this interval not displayed.    Lipid Panel:     Component Value Date/Time   CHOL 113 10/18/2018 0251   CHOL 156 07/03/2018 0936   TRIG 150 (H) 10/18/2018 0251   TRIG 148 10/18/2018 0251   TRIG 143 07/03/2018 0936   HDL 44 10/18/2018 0251   HDL 50 11/14/2017 1052   CHOLHDL 2.6 10/18/2018 0251   VLDL 30 10/18/2018 0251   VLDL 29 07/03/2018 0936   LDLCALC 39 10/18/2018 0251   LDLCALC 80 11/14/2017 1052   HgbA1c:  Lab Results  Component Value Date   HGBA1C 5.4 10/18/2018    Urine Drug Screen: No results found for: LABOPIA, COCAINSCRNUR, LABBENZ, AMPHETMU, THCU, LABBARB  Alcohol Level No results found for: ETH  IMAGING  Ct Angio Head W Or Wo Contrast Ct Angio Neck W And/or Wo Contrast  10/17/2018 IMPRESSION:  1. Occlusion of distal left M1/proximal M2 superior and inferior sylvian divisions with intermediate collateralization.  2. Patent carotid and vertebral arteries. No dissection, aneurysm, or hemodynamically significant stenosis utilizing NASCET criteria.   Dg Chest 1 View 10/17/2018 IMPRESSION:  1. ET tube and enteric tube in good position.  2. Lower lung volumes with pulmonary vascular congestion but no overt edema.    Dg Chest 1 View 10/19/2018 IMPRESSION:   Status post extubation.  No pneumothorax.  Airspace consolidation left lower lobe, concerning for pneumonia.  Minimal pleural effusions bilaterally.  There is pulmonary vascular congestion. No interstitial edema Evident.  Dg Chest 1 View 10/21/2018 IMPRESSION:  IMPRESSION: 1. Persistent central pulmonary vascular congestion and perihilar edema indicating CHF/volume overload. 2. Persistent bibasilar opacities, likely atelectasis and/or small pleural effusions, LEFT greater than RIGHT .  Mr Brain Wo Contrast 10/18/2018 IMPRESSION:  1. Moderate acute infarct in the left insula and lateral frontal lobe, primarily involving cortex.  2. Petechial hemorrhage at the infarct. There is also subarachnoid hemorrhage/contrast that is stable or decreased in extent compared to cone beam CT from yesterday  Ct Head Code Stroke Wo Contrast 10/17/2018 IMPRESSION:  1. Normal aging brain.  2. ASPECTS is 10.   Mr Brain Wo Contrast 10/18/2018 IMPRESSION: 1. Moderate acute infarct in the left insula and lateral frontal lobe, primarily involving cortex. 2. Petechial hemorrhage at the infarct. There is also subarachnoid hemorrhage/contrast that is stable or decreased in extent compared to cone beam CT  from yesterday   CT Head Wo Contrast 10/20/2018 IMPRESSION: 1. Stable distribution of left MCA infarct involving insula and left lateral frontal lobe. Persistent small volume of high attenuation material within left Sylvian fissure, likely subarachnoid hemorrhage. 2. No new acute intracranial abnormality identified.  CT Head Wo Contrast  10/21/2018 IMPRESSION: 1. Stable size and distribution of evolving left MCA territory infarct. Associated small volume subarachnoid hemorrhage within the adjacent left sylvian fissure also not significantly changed. 2. No other new acute intracranial abnormality.   IR - Cerebral Angiogram and Intervention S/P lt common carotid arteriogram fiollowed by revascularization of occluded Lt MCA M 1 seg with x 3 passes with 85mm x 29mm embotrap device and x 1 pass with 70mm x 40 mm solitaire x retriever achieving a TICI 2b revascularization.  Transthoracic Echocardiogram  10/18/2018 IMPRESSIONS  1. The left ventricle has normal systolic function, with an ejection fraction of 55-60%. The cavity size was normal. Left ventricular diastolic Doppler parameters are indeterminate secondary to atrial fibrillation.  2. The right ventricle has normal systolic function. The cavity was normal. There is no increase in right ventricular wall thickness. Right ventricular systolic pressure is moderately elevated with an estimated pressure of 38.0 mmHg.  3. Left atrial size was moderately dilated.  4. No ASD or PFO by color flow Doppler or saline microcavitation study.  5. Right atrial size was mildly dilated.  6. The mitral valve is abnormal. Mild thickening of the anterior mitral valve leaflet. Mild calcification of the anterior mitral valve leaflet. No evidence of mitral valve stenosis.  7. The aortic valve is tricuspid. Mild thickening of the aortic valve. Mild calcification of the aortic valve. Aortic valve regurgitation is mild by color flow Doppler. No stenosis of the aortic  valve.  8. The inferior vena cava was dilated in size with <50% respiratory variability.  EKG - atrial fibrillation - ventricular response 121 BPM (See cardiology reading for complete details)   PHYSICAL EXAM  Temp:  [98.9 F (37.2 C)-100.9 F (38.3 C)] 99.7 F (37.6 C) (05/25 1112) Pulse Rate:  [96-153] 113 (05/25 1112) Resp:  [21-28] 25 (05/25 1341) BP: (111-155)/(73-103) 111/84 (05/25 1542) SpO2:  [96 %-100 %] 100 % (05/25 1542)  General - Well nourished, well developed, in no apparent distress.  Ophthalmologic - fundi not visualized due to noncooperation.  Cardiovascular - irregularly irregular heart rate and rhythm with RVR.  Neuro - Not sedated.  Alert, eyes open, following commands,. PERRL, left gaze preference, crosses midline, tracking examiner. Not blinking to visual threat on the right.  Right  facial droop. Tongue midline in mouth. Not moving right arm to pain but grimaces. Spontaneous on left UE and LE, moves right lower 2/5 to stim. Slightly opened eyes today. Increased tone and upgoing on the right.   ASSESSMENT/PLAN Natalie Gardner is a 73 y.o. female with history of atrial fibrillation on Coumadin (INR 1.2), CHF, cardiomyopathy, hx of aortic valve vegetation, hx of GI bleed, chronic kidney disease, hypertension, hyperlipidemia, prediabetes and recent nausea and diarrhea (unable to hold down meds)  presenting with aphasia, right facial droop and difficulty following commands. She received IV tPA Thursday 10/17/18 at 2315. S/p IR with TICI 2b revascularization.  Stroke:  acute left MCA infarcts - embolic - atrial fibrillation off AC due to diarrhea and vomiting  MRI head - Moderate acute infarct in the left insula and lateral frontal lobe, primarily involving cortex. Petechial hemorrhage at the infarct.  CTA H&N - occlusion of distal left M1/proximal M2 superior and inferior sylvian divisions with intermediate collateralization.  Head CT 10/20/2018 - Stable  distribution of left MCA infarct involving insula and left lateral frontal lobe. Persistent small volume of high attenuation material within left Sylvian fissure, likely subarachnoid hemorrhage.  IR - left M1 occlusion with TICI2b recanalization  2D Echo - EF 55-60%. No cardiac source of emboli identified.   Sars Corona Virus 2  - negative  LDL - 39  HgbA1c - 5.4  VTE prophylaxis - SCDs  Diet - NPO  warfarin daily prior to admission, now on No antithrombotic within 24hr of tPA.  Will try to restart anticoagulation within 5 days of stroke, small subarachnoid hemorrhage and petechial hemorrhage will follow on CT of the head.  Consider DOAC.  Patient counseled to be compliant with her antithrombotic medications  Ongoing aggressive stroke risk factor management  Therapy recommendations:  CIR  Disposition:  Pending  Afib on coumadin  On coumadin but off due to recent diarrhea and vomiting  INR 1.2 on admission  Currently afib RVR  On metoprolol IV PRN  Follows with Dr. Nehemiah Massed at [facility]   May consider switch from coumadin to [place] when appropriate after discussion with pt/family  Started on Cardizem drip today for RVR. Plan to place CorTrak tomorrow and switch to PO Cardizem. (pt was on Cardizem at home) (nurses unable to place NG)  Hypertension  Blood pressure stable  BP goal 120-140 post procedure for 24 hours . Long-term BP goal normotensive  Hyperlipidemia  Lipid lowering medication PTA: Lipitor 10 mg daily  LDL 39, goal < 70  Current lipid lowering medication: none due to NPO  Continue statin at discharge  Dysphagia   Extubated 10/18/2018 Friday  Speech to follow  NPO for now  On IVF  Other Stroke Risk Factors  Advanced age  Obesity, Body mass index is 30.12 kg/m., recommend weight loss, diet and exercise as appropriate   CHF  Other Active Problems  Magnesium - 1.6 - supplement and check in AM  Acute blood loss anemia -  hemoglobin 13.3-9.2-9.9-10.5->9.9 continue to monitor  CXR - 10/21/2018 pulm congestion but no pneumonia  Temp 99.3->98.4->99.7 U/A and culture no growth (WBCs - 8.0->9.3)  Hypokalemia - 3.8->3.3->3.5->3.4->3.2 supplement and recheck in AM  NPO - dietition consulted for tube feeings  Started ASA rectally   Portable CXR today - vascular congestion / CHF -> lasix (watch potassium and magnesium) Decrease IV fluid from 75 cc/hr to 50 cc/hr.  See CXR and Head CT from today as above. Repeat Head CT in  AM.     Hospital day # 4  I spoke to both daughter and husband late afternoon and updated them, answered questions.  Patient is improving.  Today was in slight distress due to A. fib with RVR, not controlled with as needed metoprolol, started Cardizem drip.  Waiting for team to place core track in the morning, continue to work with speech therapy, hopefully will work with physical therapy in the morning now that her rate is controlled and hope patient can go to CIR.  Personally examined patient and images, and have participated in and made any corrections needed to history, physical, neuro exam,assessment and plan as stated above.  I have personally obtained the history, evaluated lab date, reviewed imaging studies and agree with radiology interpretations.    Sarina Ill, MD Stroke Neurology   A total of 35 minutes was spent for the care of this patient, spent on counseling patient and family on different diagnostic and therapeutic options, counseling and coordination of care, riskd ans benefits of management, compliance, or risk factor reduction and education.       To contact Stroke Continuity provider, please refer to [email or web address] After hours, contact General Neurology

## 2018-10-21 NOTE — Evaluation (Signed)
Clinical/Bedside Swallow Evaluation Patient Details  Name: Natalie Gardner MRN: [id number] Date of Birth: 10-May-1946  Today's Date: 10/21/2018 Time: SLP Start Time (ACUTE ONLY): 72 SLP Stop Time (ACUTE ONLY): 0935 SLP Time Calculation (min) (ACUTE ONLY): 15 min  Past Medical History:  Past Medical History:  Diagnosis Date  . Anemia   . Aortic valve regurgitation   . Arthritis   . Atrial fibrillation ([place])   . Cancer ([place]) 12/2016   Basal Cell Skin Cancer  . Cardiomyopathy ([place])   . CHF (congestive heart failure) ([place])   . CKD (chronic kidney disease)   . Colitis   . Diverticulosis   . Dyspnea   . Dysrhythmia   . GERD (gastroesophageal reflux disease)   . GI bleed   . Heart murmur   . History of hiatal hernia   . Hypercholesterolemia   . Hyperlipidemia   . Hypertension   . Insomnia   . Osteopenia   . Pre-diabetes   . Urinary, incontinence, stress female   . Vertigo    Past Surgical History:  Past Surgical History:  Procedure Laterality Date  . ANKLE SURGERY Right 1997   Fractured ankle  . COLONOSCOPY    . COLONOSCOPY WITH PROPOFOL N/A 02/08/2015   Procedure: COLONOSCOPY WITH PROPOFOL;  Surgeon: Manya Silvas, MD;  Location: [facility] ENDOSCOPY;  Service: Endoscopy;  Laterality: N/A;  . COLONOSCOPY WITH PROPOFOL N/A 08/20/2017   Procedure: COLONOSCOPY WITH PROPOFOL;  Surgeon: Manya Silvas, MD;  Location: [facility] ENDOSCOPY;  Service: Endoscopy;  Laterality: N/A;  . ESOPHAGOGASTRODUODENOSCOPY (EGD) WITH PROPOFOL N/A 08/20/2017   Procedure: ESOPHAGOGASTRODUODENOSCOPY (EGD) WITH PROPOFOL;  Surgeon: Manya Silvas, MD;  Location: [facility] ENDOSCOPY;  Service: Endoscopy;  Laterality: N/A;  . INTRAMEDULLARY (IM) NAIL INTERTROCHANTERIC Left 06/09/2018   Procedure: INTRAMEDULLARY (IM) NAIL INTERTROCHANTRIC;  Surgeon: Thornton Park, MD;  Location: [facility] ORS;  Service: Orthopedics;  Laterality: Left;  . KNEE ARTHROPLASTY Left 02/12/2017   Procedure: COMPUTER ASSISTED TOTAL KNEE  ARTHROPLASTY;  Surgeon: Dereck Leep, MD;  Location: [facility] ORS;  Service: Orthopedics;  Laterality: Left;  . RADIAL HEAD ARTHROPLASTY Left 06/21/2017   Procedure: RADIAL HEAD ARTHROPLASTY;  Surgeon: Corky Mull, MD;  Location: [facility] ORS;  Service: Orthopedics;  Laterality: Left;  . RADIOLOGY WITH ANESTHESIA N/A 10/17/2018   Procedure: IR WITH ANESTHESIA;  Surgeon: Luanne Bras, MD;  Location: [facility];  Service: Radiology;  Laterality: N/A;  . SACROCOCCYGEAL ULCER REMOVAL    . TONSILLECTOMY    . TOTAL KNEE ARTHROPLASTY Left    HPI:  Patient is a 73 y.o. female with PMH: atrial fibrillation on Coumadin, CHF, chronic kidney disease, HTN, hyperlipidemia, prediabetes, wh o presented to ER with sudden onset aphasia and right facial droop. NH stroke scale was 7 on arrival. She recieved IV TPA as well as mechanical thrombectomy. MR Brain revealed moderate acute infarct of left insula and lateral frontal lobe, primarily involving cortex; SAH that is stable compared to CT previous date.   Assessment / Plan / Recommendation Clinical Impression  Patient presents with a severe oropharyngeal dysphagia secondary to CVA. Patient exhibits almost no awareness to bolus, though she did start to move ice chip in anterior portion of oral cavity, but then exhibited delayed cough and the remining bit of ice chip remained between lips and SLP removed. Patient's current severe cognitive impairment puts her at risk for aspiration as well as nutritional support. Plan for Cortrak placement tomorrow was already made.  SLP Visit Diagnosis: Dysphagia, oropharyngeal phase (R13.12)  Aspiration Risk  Severe aspiration risk;Risk for inadequate nutrition/hydration    Diet Recommendation Alternative means - temporary   Medication Administration: Via alternative means    Other  Recommendations Oral Care Recommendations: Oral care QID   Follow up Recommendations Skilled Nursing facility;Inpatient Rehab;24 hour  supervision/assistance      Frequency and Duration min 3x week  2 weeks       Prognosis Prognosis for Safe Diet Advancement: Guarded Barriers to Reach Goals: Severity of deficits      Swallow Study   General Date of Onset: 10/17/18 HPI: Patient is a 73 y.o. female with PMH: atrial fibrillation on Coumadin, CHF, chronic kidney disease, HTN, hyperlipidemia, prediabetes, wh o presented to ER with sudden onset aphasia and right facial droop. NH stroke scale was 7 on arrival. She recieved IV TPA as well as mechanical thrombectomy. MR Brain revealed moderate acute infarct of left insula and lateral frontal lobe, primarily involving cortex; SAH that is stable compared to CT previous date. Type of Study: Bedside Swallow Evaluation Previous Swallow Assessment: N/A Diet Prior to this Study: NPO Temperature Spikes Noted: No History of Recent Intubation: No Behavior/Cognition: Alert;Confused;Requires cueing;Distractible;Doesn't follow directions Oral Cavity Assessment: Within Functional Limits Oral Care Completed by SLP: Yes Oral Cavity - Dentition: Adequate natural dentition Self-Feeding Abilities: Total assist Patient Positioning: Upright in bed Baseline Vocal Quality: Not observed Volitional Cough: Cognitively unable to elicit Volitional Swallow: Unable to elicit    Oral/Motor/Sensory Function Overall Oral Motor/Sensory Function: Other (comment)   Ice Chips Ice chips: Impaired Oral Phase Impairments: Reduced labial seal;Poor awareness of bolus Oral Phase Functional Implications: Right anterior spillage Pharyngeal Phase Impairments: Other (comments);Cough - Delayed   Thin Liquid Thin Liquid: Not tested    Nectar Thick Nectar Thick Liquid: Not tested   Honey Thick Honey Thick Liquid: Not tested   Puree Puree: Not tested   Solid     Solid: Not tested      Natalie Gardner 10/21/2018,5:49 PM   Natalie Baller, MA, CCC-SLP Speech Therapy [facility] Acute Re[facility] Pager:  [phone]

## 2018-10-21 NOTE — Progress Notes (Signed)
PT Cancellation Note  Patient Details Name: Natalie Gardner MRN: [id number] DOB: 28-Nov-1945   Cancelled Treatment:    Reason Eval/Treat Not Completed: Patient not medically ready. Checked on pt several times today and HR continues to be elevated. Noted up to 156 bpm at rest while watching monitor at 1445. Will continue to follow.     Thelma Comp 10/21/2018, 2:47 PM   Rolinda Roan, PT, DPT Acute Rehabilitation Services Pager: [phone] Office: [phone]

## 2018-10-21 NOTE — Evaluation (Addendum)
Speech Language Pathology Evaluation Patient Details Name: KEELY DRENNAN MRN: [id number] DOB: 05-08-1946 Today's Date: 10/21/2018 Time: 7829-5621 15 minutes     Problem List:  Patient Active Problem List   Diagnosis Date Noted  . Middle cerebral artery embolism, left 10/18/2018  . Oropharyngeal dysphagia   . Acute ischemic stroke ([place]) 10/17/2018  . AKI (acute kidney injury) ([place]) 06/25/2018  . Hypertension associated with diabetes ([place]) 06/14/2018  . Dyslipidemia associated with type 2 diabetes mellitus ([place]) 06/14/2018  . GERD without esophagitis 06/14/2018  . Anemia 06/14/2018  . Current use of long term anticoagulation 06/14/2018  . Closed left hip fracture ([place]) 06/08/2018  . Depression, major, single episode, moderate ([place]) 11/14/2017  . IDA (iron deficiency anemia) 07/31/2017  . Closed displaced fracture of head of left radius 06/20/2017  . Insomnia 04/10/2017  . Status post total left knee replacement 02/12/2017  . 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 Advanced care planning/counseling discussion 09/28/2016  . Essential hypertension 09/13/2015  . CHF (congestive heart failure) ([place]) 09/13/2015  . Atrial fibrillation with RVR ([place]) 09/13/2015  . Hyperlipidemia 09/13/2015  . Obesity, diabetes, and hypertension syndrome ([place]) 09/13/2015   Past Medical History:  Past Medical History:  Diagnosis Date  . Anemia   . Aortic valve regurgitation   . Arthritis   . Atrial fibrillation ([place])   . Cancer ([place]) 12/2016   Basal Cell Skin Cancer  . Cardiomyopathy ([place])   . CHF (congestive heart failure) ([place])   . CKD (chronic kidney disease)   . Colitis   . Diverticulosis   . Dyspnea   . Dysrhythmia   . GERD (gastroesophageal reflux disease)   . GI bleed   . Heart murmur   . History of hiatal hernia   . Hypercholesterolemia   . Hyperlipidemia   . Hypertension   . Insomnia   . Osteopenia   . Pre-diabetes   . Urinary, incontinence, stress female   . Vertigo    Past Surgical  History:  Past Surgical History:  Procedure Laterality Date  . ANKLE SURGERY Right 1997   Fractured ankle  . COLONOSCOPY    . COLONOSCOPY WITH PROPOFOL N/A 02/08/2015   Procedure: COLONOSCOPY WITH PROPOFOL;  Surgeon: Manya Silvas, MD;  Location[facility] ENDOSCOPY;  Service: Endoscopy;  Laterality: N/A;  . COLONOSCOPY WITH PROPOFOL N/A 08/20/2017   Procedure: COLONOSCOPY WITH PROPOFOL;  Surgeon: Manya Silvas, MD;  Location: [facility] ENDOSCOPY;  Service: Endoscopy;  Laterality: N/A;  . ESOPHAGOGASTRODUODENOSCOPY (EGD) WITH PROPOFOL N/A 08/20/2017   Procedure: ESOPHAGOGASTRODUODENOSCOPY (EGD) WITH PROPOFOL;  Surgeon: Manya Silvas, MD;  Location: [facility] ENDOSCOPY;  Service: Endoscopy;  Laterality: N/A;  . INTRAMEDULLARY (IM) NAIL INTERTROCHANTERIC Left 06/09/2018   Procedure: INTRAMEDULLARY (IM) NAIL INTERTROCHANTRIC;  Surgeon: Thornton Park, MD;  Location: [facility] ORS;  Service: Orthopedics;  Laterality: Left;  . KNEE ARTHROPLASTY Left 02/12/2017   Procedure: COMPUTER ASSISTED TOTAL KNEE ARTHROPLASTY;  Surgeon: Dereck Leep, MD;  Location: [facility] ORS;  Service: Orthopedics;  Laterality: Left;  . RADIAL HEAD ARTHROPLASTY Left 06/21/2017   Procedure: RADIAL HEAD ARTHROPLASTY;  Surgeon: Corky Mull, MD;  Location: [facility] ORS;  Service: Orthopedics;  Laterality: Left;  . RADIOLOGY WITH ANESTHESIA N/A 10/17/2018   Procedure: IR WITH ANESTHESIA;  Surgeon: Luanne Bras, MD;  Location: [place];  Service: Radiology;  Laterality: N/A;  . SACROCOCCYGEAL ULCER REMOVAL    . TONSILLECTOMY    . TOTAL KNEE ARTHROPLASTY Left    HPI:  Patient is a 73 y.o. female  with PMH: atrial fibrillation on Coumadin, CHF, chronic kidney disease, HTN, hyperlipidemia, prediabetes, wh o presented to ER with sudden onset aphasia and right facial droop. NH stroke scale was 7 on arrival. She recieved IV TPA as well as mechanical thrombectomy. MR Brain revealed moderate acute infarct of left insula and lateral frontal lobe,  primarily involving cortex; SAH that is stable compared to CT previous date.   Assessment / Plan / Recommendation Clinical Impression  Patient presents with a severe cognitive impairment and global aphasia secondary to CVA. In addition, she is exhibiting high HR, with range in 120-150 while lying in bed and with increased WOB but with stable SpO2 percentage. Patient is currently non-verbal with no attempts to speak. She will turn head to right when prompted, will nod head to respond to basic needs quesitons (only nods yes, no head shake) and will open mouth but did not follow any other commands. (when asked to raise hands, she opened mouth again). Patient is currently total assist for all basic functional ADL"s.     SLP Assessment  SLP Recommendation/Assessment: Patient needs continued Speech Lanaguage Pathology Services SLP Visit Diagnosis: Cognitive communication deficit (R41.841);Aphasia (R47.01);Apraxia (R48.2)    Follow Up Recommendations  Skilled Nursing facility;Inpatient Rehab;24 hour supervision/assistance    Frequency and Duration min 3x week  2 weeks      SLP Evaluation Cognition  Overall Cognitive Status: Impaired/Different from baseline Orientation Level: Other (comment)(unable to verbalize) Attention: Focused Focused Attention: Impaired Focused Attention Impairment: Verbal basic;Functional basic Executive Function: Initiating Initiating: Impaired Initiating Impairment: Functional basic Behaviors: Restless       Comprehension  Auditory Comprehension Overall Auditory Comprehension: Impaired Yes/No Questions: Impaired Basic Biographical Questions: 0-25% accurate(no responses to bio questions) Commands: Impaired One Step Basic Commands: 0-24% accurate Conversation: Other (comment)(no attempts to speak)    Expression Expression Primary Mode of Expression: Other (comment)(nonverbal, intermittently nods head to respond to questions of immediate need) Verbal  Expression Initiation: Impaired Interfering Components: Attention Non-Verbal Means of Communication: Other (comment)(unable to point, but did nod head for basic immediate need questions)   Oral / Motor  Oral Motor/Sensory Function Overall Oral Motor/Sensory Function: Other (comment)(patient produced limited phonation, no verbalizations)  Apraxia  GO                    [name] 10/21/2018, 5:35 PM  Sonia Baller, MA, CCC-SLP Speech Therapy [facility] Acute Rehab Pager: [phone]

## 2018-10-21 NOTE — Consult Note (Signed)
CARDIOLOGY CONSULT NOTE  Patient ID: KYLEN ISMAEL MRN: [id number] DOB/AGE: [birth year] 73 y.o.  Admit date: 10/17/2018 Primary Physician Guadalupe Maple, MD Primary Cardiologist Dr. Serafina Royals.   Chief Complaint  Atrial fib with RVR Requesting  Dr. Leonel Ramsay  HPI:   The patient has a history of chronic atrial fib treated with warfarin and rate control.  Shewas admitted on 5/21 with acute left MCA stroke.  She had been off of anticoagulation secondary to diarrhea and vomiting.   INR was subtherapeutic She received tPA on 10/17/18.  Yesterday she was fund to have an apparent subarachnoid hemorrhage.    Her ventricular rate has been elevated and she is on IV Cardizem and has been getting prn IV metoprolol 5 mg and has had several doses.   She has had volume overload on CXR and has had one dose of 20 mg IV Lasix.    She is unable to give any history secondary to her CVA.  I did review previous cardiology records.  Her atrial fib has been well controlled in the past.      Past Medical History:  Diagnosis Date   Anemia    Aortic valve regurgitation    Arthritis    Atrial fibrillation ([place])    Cancer (HCC) 12/2016   Basal Cell Skin Cancer   CHF (congestive heart failure) (HCC)    CKD (chronic kidney disease)    Colitis    Diverticulosis    GERD (gastroesophageal reflux disease)    GI bleed    History of hiatal hernia    Hypercholesterolemia    Hyperlipidemia    Hypertension    Insomnia    Osteopenia    Pre-diabetes    Urinary, incontinence, stress female    Vertigo     Past Surgical History:  Procedure Laterality Date   ANKLE SURGERY Right 1997   Fractured ankle   COLONOSCOPY     COLONOSCOPY WITH PROPOFOL N/A 02/08/2015   Procedure: COLONOSCOPY WITH PROPOFOL;  Surgeon: Manya Silvas, MD;  Location: [place];  Service: Endoscopy;  Laterality: N/A;   COLONOSCOPY WITH PROPOFOL N/A 08/20/2017   Procedure: COLONOSCOPY WITH PROPOFOL;   Surgeon: Manya Silvas, MD;  Location: [facility] ENDOSCOPY;  Service: Endoscopy;  Laterality: N/A;   ESOPHAGOGASTRODUODENOSCOPY (EGD) WITH PROPOFOL N/A 08/20/2017   Procedure: ESOPHAGOGASTRODUODENOSCOPY (EGD) WITH PROPOFOL;  Surgeon: Manya Silvas, MD;  Location: [facility] ENDOSCOPY;  Service: Endoscopy;  Laterality: N/A;   INTRAMEDULLARY (IM) NAIL INTERTROCHANTERIC Left 06/09/2018   Procedure: INTRAMEDULLARY (IM) NAIL INTERTROCHANTRIC;  Surgeon: Thornton Park, MD;  Location: [facility] ORS;  Service: Orthopedics;  Laterality: Left;   KNEE ARTHROPLASTY Left 02/12/2017   Procedure: COMPUTER ASSISTED TOTAL KNEE ARTHROPLASTY;  Surgeon: Dereck Leep, MD;  Location: [facility] ORS;  Service: Orthopedics;  Laterality: Left;   RADIAL HEAD ARTHROPLASTY Left 06/21/2017   Procedure: RADIAL HEAD ARTHROPLASTY;  Surgeon: Corky Mull, MD;  Location: [facility] ORS;  Service: Orthopedics;  Laterality: Left;   RADIOLOGY WITH ANESTHESIA N/A 10/17/2018   Procedure: IR WITH ANESTHESIA;  Surgeon: Luanne Bras, MD;  Location: [place];  Service: Radiology;  Laterality: N/A;   SACROCOCCYGEAL ULCER REMOVAL     TONSILLECTOMY     TOTAL KNEE ARTHROPLASTY Left     Allergies  Allergen Reactions   Penicillins     Yeast infections Has patient had a PCN reaction causing immediate rash, facial/tongue/throat swelling, SOB or lightheadedness with hypotension: No Has patient had a PCN reaction causing severe rash involving mucus  membranes or skin necrosis: No Has patient had a PCN reaction that required hospitalization: No Has patient had a PCN reaction occurring within the last 10 years: No If all of the above answers are "NO", then may proceed with Cephalosporin use.    Penicillin V Potassium Other (See Comments)    Yeast infection   Protonix [Pantoprazole Sodium] Rash   Sulfa Antibiotics     Yeast infections  Other reaction(s): Other (See Comments) Yeast infection   Medications Prior to Admission  Medication Sig  Dispense Refill Last Dose   acetaminophen (TYLENOL) 650 MG CR tablet Take 1,300 mg by mouth at bedtime.   10/18/2018 at Unknown time   diltiazem (DILACOR XR) 240 MG 24 hr capsule Take 1 capsule (240 mg total) by mouth daily. 90 capsule 2 10/18/2018 at Unknown time   docusate sodium (COLACE) 100 MG capsule Take 1 capsule (100 mg total) by mouth 2 (two) times daily as needed for mild constipation. 10 capsule 0 Past Week at Unknown time   ferrous sulfate 325 (65 FE) MG tablet Take 1 tablet (325 mg total) by mouth 2 (two) times daily with a meal. 60 tablet 6 10/18/2018 at Unknown time   furosemide (LASIX) 20 MG tablet Take 20 mg by mouth daily.    10/18/2018 at Unknown time   lisinopril (PRINIVIL,ZESTRIL) 10 MG tablet Take 1 tablet (10 mg total) by mouth daily. 90 tablet 2 10/18/2018 at Unknown time   Multiple Vitamin (MULTI-VITAMINS) TABS Take 1 tablet by mouth daily.   10/18/2018 at Unknown time   ranitidine (ZANTAC) 150 MG tablet Take 150 mg by mouth daily.   10/18/2018 at Unknown time   sucralfate (CARAFATE) 1 g tablet Take 1 tablet (1 g total) by mouth 2 (two) times daily. 60 tablet 0 10/18/2018 at Unknown time   traZODone (DESYREL) 50 MG tablet Take 0.5-1 tablets (25-50 mg total) by mouth at bedtime as needed for sleep. 30 tablet 0 10/18/2018 at Unknown time   venlafaxine XR (EFFEXOR XR) 75 MG 24 hr capsule Take 1 capsule (75 mg total) by mouth daily with breakfast. 90 capsule 2 10/18/2018 at Unknown time   atorvastatin (LIPITOR) 10 MG tablet Take 1 tablet (10 mg total) by mouth at bedtime. 90 tablet 2 10/17/2018   warfarin (COUMADIN) 1 MG tablet Take 1 tablet (1 mg total) by mouth daily. Give with the 6 mg tablet to equal 7 mg (Patient not taking: Reported on 10/19/2018) 30 tablet 0 Not Taking at Unknown time   warfarin (COUMADIN) 5 MG tablet Take 5 mg by mouth one time only at 6 PM. Takes 5 mg 4 times a week.(  Day of week unknown)  Alternating between 6 mg 3 times  weekly.   unk   warfarin  (COUMADIN) 6 MG tablet Take 1 tablet (6 mg total) by mouth at bedtime. (Patient taking differently: Take 6 mg by mouth at bedtime. Pt takes 3 times weekly. Days of week- unknown) 30 tablet 0 unk   Family History  Problem Relation Age of Onset   Heart disease Mother    Diabetes Mother    Heart disease Father    Parkinson's disease Father    Diabetes Brother    Cancer Brother        Colon    Social History   Socioeconomic History   Marital status: Married    Spouse name: Not on file   Number of children: Not on file   Years of education: Not on file  Highest education level: 12th grade  Occupational History   Not on file  Social Needs   Financial resource strain: Not hard at all   Food insecurity:    Worry: Never true    Inability: Never true   Transportation needs:    Medical: No    Non-medical: No  Tobacco Use   Smoking status: Never Smoker   Smokeless tobacco: Never Used  Substance and Sexual Activity   Alcohol use: No   Drug use: No   Sexual activity: Not on file  Lifestyle   Physical activity:    Days per week: 0 days    Minutes per session: 0 min   Stress: Not at all  Relationships   Social connections:    Talks on phone: More than three times a week    Gets together: More than three times a week    Attends religious service: More than 4 times per year    Active member of club or organization: No    Attends meetings of clubs or organizations: Never    Relationship status: Married   Intimate partner violence:    Fear of current or ex partner: No    Emotionally abused: No    Physically abused: No    Forced sexual activity: No  Other Topics Concern   Not on file  Social History Narrative   Independent at baseline.  Lives at home with family     ROS:  Unable to assess secondary to her CVA  Physical Exam: Blood pressure (!) 137/94, pulse 70, temperature (!) 100.4 F (38 C), temperature source Oral, resp. rate (!) 30, height 5'  5" (1.651 m), weight 82.1 kg, SpO2 100 %.  GENERAL: Acutely ill appearing, tachypnea.  HEENT:  Dry mucosa NECK:   Lying flat,  waveform within normal limits, carotid upstroke brisk and symmetric, no bruits, no thyromegaly LYMPHATICS:  No cervical, inguinal adenopathy LUNGS:  Transmitted upper airway sounds BACK:  No CVA tenderness CHEST:  Unremarkable HEART:  PMI not displaced or sustained,S1 and S2 within normal limits, no S3, no clicks, no rubs, no murmurs, irregular  ABD:  Flat, positive bowel sounds normal in frequency in pitch, no bruits, no rebound, no guarding, no midline pulsatile mass, no hepatomegaly, no splenomegaly EXT:  2 plus pulses throughout, no edema, no cyanosis no clubbing SKIN:  No rashes no nodules NEURO:  Cranial nerves II through XII grossly intact, motor grossly intact throughout PSYCH:  Unable to assess    Labs: Lab Results  Component Value Date   BUN 18 10/21/2018   Lab Results  Component Value Date   CREATININE 0.76 10/21/2018   Lab Results  Component Value Date   NA 138 10/21/2018   K 3.2 (L) 10/21/2018   CL 109 10/21/2018   CO2 16 (L) 10/21/2018   Lab Results  Component Value Date   TROPONINI <0.03 06/16/2017   Lab Results  Component Value Date   WBC 9.3 10/21/2018   HGB 9.9 (L) 10/21/2018   HCT 30.9 (L) 10/21/2018   MCV 93.1 10/21/2018   PLT 212 10/21/2018   Lab Results  Component Value Date   CHOL 113 10/18/2018   HDL 44 10/18/2018   LDLCALC 39 10/18/2018   TRIG 150 (H) 10/18/2018   TRIG 148 10/18/2018   CHOLHDL 2.6 10/18/2018   Lab Results  Component Value Date   ALT 15 10/19/2018   AST 20 10/19/2018   ALKPHOS 75 10/19/2018   BILITOT 0.7 10/19/2018  Echo:   1. The left ventricle has normal systolic function, with an ejection fraction of 55-60%. The cavity size was normal. Left ventricular diastolic Doppler parameters are indeterminate secondary to atrial fibrillation.  2. The right ventricle has normal systolic function.  The cavity was normal. There is no increase in right ventricular wall thickness. Right ventricular systolic pressure is moderately elevated with an estimated pressure of 38.0 mmHg.  3. Left atrial size was moderately dilated.  4. No ASD or PFO by color flow Doppler or saline microcavitation study.  5. Right atrial size was mildly dilated.  6. The mitral valve is abnormal. Mild thickening of the anterior mitral valve leaflet. Mild calcification of the anterior mitral valve leaflet. No evidence of mitral valve stenosis.  7. The aortic valve is tricuspid. Mild thickening of the aortic valve. Mild calcification of the aortic valve. Aortic valve regurgitation is mild by color flow Doppler. No stenosis of the aortic valve.  8. The inferior vena cava was dilated in size with <50% respiratory variability.    Radiology:   CXR: 1. Persistent central pulmonary vascular congestion and perihilar edema indicating CHF/volume overload. 2. Persistent bibasilar opacities, likely atelectasis and/or small pleural effusions, LEFT greater than RIGHT.   EKG:  Atrial fib with RVR rate 121, axis WNL, borderline QT prolongation.  No acute ST T wave changes.  10/17/18  ASSESSMENT AND PLAN:   ACUTE ON CHRONIC DIASTOLIC HF:    Increase IV Lasix.  We need strict I/O and daily weights.   Will give one dose of IV Lasix and she will need to have her diuretic needs reassessed in the AM.  ATRIAL FIB WITH RAPID RATE:  OK to increase Cardizem to 20 mg.  I discussed this with the nurse.  Can continue IV beta blocker scheduled.  Holding on anticoagulation until OK with neurology.    HTN:    BP is elevated.  Follow with titration as meds as above.   DYSLIPIDEMIA:  Resume previous med when able.   HYPOKALEMIA:  Supplemented today.  Follow in AM.    Signed: Minus Breeding 10/21/2018, 8:10 PM

## 2018-10-22 ENCOUNTER — Inpatient Hospital Stay (HOSPITAL_COMMUNITY): Payer: Medicare Other

## 2018-10-22 DIAGNOSIS — J9600 Acute respiratory failure, unspecified whether with hypoxia or hypercapnia: Secondary | ICD-10-CM | POA: Diagnosis not present

## 2018-10-22 LAB — GLUCOSE, CAPILLARY
Glucose-Capillary: 114 mg/dL — ABNORMAL HIGH (ref 70–99)
Glucose-Capillary: 121 mg/dL — ABNORMAL HIGH (ref 70–99)
Glucose-Capillary: 126 mg/dL — ABNORMAL HIGH (ref 70–99)
Glucose-Capillary: 127 mg/dL — ABNORMAL HIGH (ref 70–99)
Glucose-Capillary: 138 mg/dL — ABNORMAL HIGH (ref 70–99)
Glucose-Capillary: 167 mg/dL — ABNORMAL HIGH (ref 70–99)
Glucose-Capillary: 99 mg/dL (ref 70–99)

## 2018-10-22 LAB — BASIC METABOLIC PANEL
Anion gap: 16 — ABNORMAL HIGH (ref 5–15)
BUN: 14 mg/dL (ref 8–23)
CO2: 19 mmol/L — ABNORMAL LOW (ref 22–32)
Calcium: 8.8 mg/dL — ABNORMAL LOW (ref 8.9–10.3)
Chloride: 102 mmol/L (ref 98–111)
Creatinine, Ser: 0.81 mg/dL (ref 0.44–1.00)
GFR calc Af Amer: >60 mL/min (ref >60)
GFR calc non Af Amer: >60 mL/min (ref >60)
Glucose, Bld: 142 mg/dL — ABNORMAL HIGH (ref 70–99)
Potassium: 3.2 mmol/L — ABNORMAL LOW (ref 3.5–5.1)
Sodium: 137 mmol/L (ref 135–145)

## 2018-10-22 LAB — CBC WITH DIFFERENTIAL/PLATELET
Abs Immature Granulocytes: 0.06 10*3/uL (ref 0.00–0.07)
Basophils Absolute: 0 10*3/uL (ref 0.0–0.1)
Basophils Relative: 0 %
Eosinophils Absolute: 0 10*3/uL (ref 0.0–0.5)
Eosinophils Relative: 0 %
HCT: 35.5 % — ABNORMAL LOW (ref 36.0–46.0)
Hemoglobin: 11.5 g/dL — ABNORMAL LOW (ref 12.0–15.0)
Immature Granulocytes: 0 %
Lymphocytes Relative: 7 %
Lymphs Abs: 1.1 10*3/uL (ref 0.7–4.0)
MCH: 29.8 pg (ref 26.0–34.0)
MCHC: 32.4 g/dL (ref 30.0–36.0)
MCV: 92 fL (ref 80.0–100.0)
Monocytes Absolute: 1.2 10*3/uL — ABNORMAL HIGH (ref 0.1–1.0)
Monocytes Relative: 8 %
Neutro Abs: 11.9 10*3/uL — ABNORMAL HIGH (ref 1.7–7.7)
Neutrophils Relative %: 85 %
Platelets: 279 10*3/uL (ref 150–400)
RBC: 3.86 MIL/uL — ABNORMAL LOW (ref 3.87–5.11)
RDW: 15.4 % (ref 11.5–15.5)
WBC: 14.2 10*3/uL — ABNORMAL HIGH (ref 4.0–10.5)
nRBC: 0 % (ref 0.0–0.2)

## 2018-10-22 LAB — BLOOD GAS, ARTERIAL
Acid-base deficit: 4.2 mmol/L — ABNORMAL HIGH (ref 0.0–2.0)
Bicarbonate: 18.5 mmol/L — ABNORMAL LOW (ref 20.0–28.0)
Drawn by: 559801
O2 Content: 5 L/min
O2 Saturation: 97.7 %
Patient temperature: 98.6
pCO2 arterial: 24.3 mmHg — ABNORMAL LOW (ref 32.0–48.0)
pH, Arterial: 7.495 — ABNORMAL HIGH (ref 7.350–7.450)
pO2, Arterial: 92.2 mmHg (ref 83.0–108.0)

## 2018-10-22 LAB — MAGNESIUM: Magnesium: 2.3 mg/dL (ref 1.7–2.4)

## 2018-10-22 MED ORDER — FUROSEMIDE 10 MG/ML IJ SOLN
20.0000 mg | Freq: Once | INTRAMUSCULAR | Status: AC
  Administered 2018-10-22: 09:00:00 20 mg via INTRAVENOUS
  Filled 2018-10-22: qty 4

## 2018-10-22 MED ORDER — SODIUM CHLORIDE 0.9 % IV SOLN
2.0000 g | INTRAVENOUS | Status: DC
  Administered 2018-10-22 – 2018-10-24 (×3): 2 g via INTRAVENOUS
  Filled 2018-10-22 (×5): qty 20

## 2018-10-22 MED ORDER — POTASSIUM CHLORIDE 10 MEQ/100ML IV SOLN
10.0000 meq | INTRAVENOUS | Status: AC
  Administered 2018-10-22 (×2): 10 meq via INTRAVENOUS
  Filled 2018-10-22 (×2): qty 100

## 2018-10-22 MED ORDER — METRONIDAZOLE IN NACL 5-0.79 MG/ML-% IV SOLN
500.0000 mg | Freq: Three times a day (TID) | INTRAVENOUS | Status: DC
  Administered 2018-10-22 – 2018-10-25 (×9): 500 mg via INTRAVENOUS
  Filled 2018-10-22 (×9): qty 100

## 2018-10-22 NOTE — Care Management Important Message (Signed)
Important Message  Patient Details  Name: Natalie Gardner MRN: [id number] Date of Birth: [birth year]   Medicare Important Message Given:  Yes  Due to illness patient is not able to sign.  Unsigned copy left at the patient bedside.    Gabryel Files 10/22/2018, 2:02 PM

## 2018-10-22 NOTE — Progress Notes (Signed)
STROKE TEAM PROGRESS NOTE   SUBJECTIVE (INTERVAL HISTORY) Her RN is at the bedside.She has developed significant respiratory distress this morning after core  Track feeding tube placement.  She is maintaining her oxygen sats on nasal cannula but breathing 30 times per minute and has some upper airway sounds and stridor.  She is also in A. fib with rapid ventricular rate in the 120-140 range despite being on Cardizem drip.  She remains aphasic with right hemiplegia.  Blood pressure is adequately controlled.  Arterial blood gas shows pH 7.49, PCO2 24.3, bicarb 18.5 and oxygen sats 97.7% on 5 L nasal cannula OBJECTIVE Vitals:   10/22/18 1155 10/22/18 1204 10/22/18 1525 10/22/18 1649  BP: (!) 153/88 129/74 105/84   Pulse: (!) 121  (!) 113   Resp: (!) 28 (!) 22 (!) 31   Temp:  99.8 F (37.7 C)  (!) 100.7 F (38.2 C)  TempSrc:  Axillary  Axillary  SpO2: 99% 100% 96%   Weight:      Height:        CBC:  Recent Labs  Lab 10/21/18 0431 10/22/18 0345  WBC 9.3 14.2*  NEUTROABS 7.7 11.9*  HGB 9.9* 11.5*  HCT 30.9* 35.5*  MCV 93.1 92.0  PLT 212 102    Basic Metabolic Panel:  Recent Labs  Lab 10/18/18 0251  10/21/18 0431 10/22/18 0345  NA 136   < > 138 137  K 3.8   < > 3.2* 3.2*  CL 108   < > 109 102  CO2 20*   < > 16* 19*  GLUCOSE 170*   < > 93 142*  BUN 13   < > 18 14  CREATININE 0.66   < > 0.76 0.81  CALCIUM 7.6*   < > 8.0* 8.8*  MG 1.6*   < > 1.6* 2.3  PHOS 3.9  --   --   --    < > = values in this interval not displayed.    Lipid Panel:     Component Value Date/Time   CHOL 113 10/18/2018 0251   CHOL 156 07/03/2018 0936   TRIG 150 (H) 10/18/2018 0251   TRIG 148 10/18/2018 0251   TRIG 143 07/03/2018 0936   HDL 44 10/18/2018 0251   HDL 50 11/14/2017 1052   CHOLHDL 2.6 10/18/2018 0251   VLDL 30 10/18/2018 0251   VLDL 29 07/03/2018 0936   LDLCALC 39 10/18/2018 0251   LDLCALC 80 11/14/2017 1052   HgbA1c:  Lab Results  Component Value Date   HGBA1C 5.4 10/18/2018    Urine Drug Screen: No results found for: LABOPIA, COCAINSCRNUR, LABBENZ, AMPHETMU, THCU, LABBARB  Alcohol Level No results found for: ETH  IMAGING  Ct Angio Head W Or Wo Contrast Ct Angio Neck W And/or Wo Contrast  10/17/2018 IMPRESSION:  1. Occlusion of distal left M1/proximal M2 superior and inferior sylvian divisions with intermediate collateralization.  2. Patent carotid and vertebral arteries. No dissection, aneurysm, or hemodynamically significant stenosis utilizing NASCET criteria.   Dg Chest 1 View 10/17/2018 IMPRESSION:  1. ET tube and enteric tube in good position.  2. Lower lung volumes with pulmonary vascular congestion but no overt edema.    Dg Chest 1 View 10/19/2018 IMPRESSION:   Status post extubation.  No pneumothorax.  Airspace consolidation left lower lobe, concerning for pneumonia.  Minimal pleural effusions bilaterally.  There is pulmonary vascular congestion. No interstitial edema Evident.  Dg Chest 1 View 10/21/2018 IMPRESSION:  IMPRESSION: 1. Persistent central pulmonary  vascular congestion and perihilar edema indicating CHF/volume overload. 2. Persistent bibasilar opacities, likely atelectasis and/or small pleural effusions, LEFT greater than RIGHT .  Mr Brain Wo Contrast 10/18/2018 IMPRESSION:  1. Moderate acute infarct in the left insula and lateral frontal lobe, primarily involving cortex.  2. Petechial hemorrhage at the infarct. There is also subarachnoid hemorrhage/contrast that is stable or decreased in extent compared to cone beam CT from yesterday  Ct Head Code Stroke Wo Contrast 10/17/2018 IMPRESSION:  1. Normal aging brain.  2. ASPECTS is 10.   Mr Brain Wo Contrast 10/18/2018 IMPRESSION: 1. Moderate acute infarct in the left insula and lateral frontal lobe, primarily involving cortex. 2. Petechial hemorrhage at the infarct. There is also subarachnoid hemorrhage/contrast that is stable or decreased in extent compared to cone beam CT  from yesterday   CT Head Wo Contrast 10/20/2018 IMPRESSION: 1. Stable distribution of left MCA infarct involving insula and left lateral frontal lobe. Persistent small volume of high attenuation material within left Sylvian fissure, likely subarachnoid hemorrhage. 2. No new acute intracranial abnormality identified.  CT Head Wo Contrast  10/21/2018 IMPRESSION: 1. Stable size and distribution of evolving left MCA territory infarct. Associated small volume subarachnoid hemorrhage within the adjacent left sylvian fissure also not significantly changed. 2. No other new acute intracranial abnormality.   IR - Cerebral Angiogram and Intervention S/P lt common carotid arteriogram fiollowed by revascularization of occluded Lt MCA M 1 seg with x 3 passes with 102mm x 6mm embotrap device and x 1 pass with 23mm x 40 mm solitaire x retriever achieving a TICI 2b revascularization.  Transthoracic Echocardiogram  10/18/2018 IMPRESSIONS  1. The left ventricle has normal systolic function, with an ejection fraction of 55-60%. The cavity size was normal. Left ventricular diastolic Doppler parameters are indeterminate secondary to atrial fibrillation.  2. The right ventricle has normal systolic function. The cavity was normal. There is no increase in right ventricular wall thickness. Right ventricular systolic pressure is moderately elevated with an estimated pressure of 38.0 mmHg.  3. Left atrial size was moderately dilated.  4. No ASD or PFO by color flow Doppler or saline microcavitation study.  5. Right atrial size was mildly dilated.  6. The mitral valve is abnormal. Mild thickening of the anterior mitral valve leaflet. Mild calcification of the anterior mitral valve leaflet. No evidence of mitral valve stenosis.  7. The aortic valve is tricuspid. Mild thickening of the aortic valve. Mild calcification of the aortic valve. Aortic valve regurgitation is mild by color flow Doppler. No stenosis of the aortic  valve.  8. The inferior vena cava was dilated in size with <50% respiratory variability.  EKG - atrial fibrillation - ventricular response 121 BPM (See cardiology reading for complete details)   PHYSICAL EXAM  Temp:  [98.8 F (37.1 C)-100.7 F (38.2 C)] 100.7 F (38.2 C) (05/26 1649) Pulse Rate:  [70-153] 113 (05/26 1525) Resp:  [16-42] 31 (05/26 1525) BP: (105-153)/(74-102) 105/84 (05/26 1525) SpO2:  [96 %-100 %] 96 % (05/26 1525) FiO2 (%):  [1 %-40 %] 40 % (05/26 1525) Weight:  [83.4 kg] 83.4 kg (05/26 0500)  .  Elderly Caucasian lady who is in respiratory distress.  She has rapid heart rate in 120-140 from A. fib despite being on Cardizem drip. . Afebrile. Head is nontraumatic. Neck is supple without bruit.    Cardiac exam no murmur or gallop. Lungs are clear to auscultation with prominent upper airway sounds. Distal pulses are well felt.  Neurological Exam :  -  Patient is drowsy but, eyes open, following occasional commands,.  Aphasic and not speaking.   left gaze preference, crosses midline, tracking examiner. Not blinking to visual threat on the right.  Right facial droop. Tongue midline in mouth.  Dense right hemiplegia with not moving right arm to pain but grimaces. Spontaneous on left UE and LE, moves right lower 2/5 to stim. Slightly opened eyes today. Increased tone and upgoing on the right.   ASSESSMENT/PLAN Ms. BAILLEY GUILFORD is a 73 y.o. female with history of atrial fibrillation on Coumadin (INR 1.2), CHF, cardiomyopathy, hx of aortic valve vegetation, hx of GI bleed, chronic kidney disease, hypertension, hyperlipidemia, prediabetes and recent nausea and diarrhea (unable to hold down meds)  presenting with aphasia, right facial droop and difficulty following commands. She received IV tPA Thursday 10/17/18 at 2315. S/p IR with TICI 2b revascularization.  Stroke:  acute left MCA infarcts - embolic - atrial fibrillation off AC due to diarrhea and vomiting  MRI head - Moderate  acute infarct in the left insula and lateral frontal lobe, primarily involving cortex. Petechial hemorrhage at the infarct.  CTA H&N - occlusion of distal left M1/proximal M2 superior and inferior sylvian divisions with intermediate collateralization.  Head CT 10/20/2018 - Stable distribution of left MCA infarct involving insula and left lateral frontal lobe. Persistent small volume of high attenuation material within left Sylvian fissure, likely subarachnoid hemorrhage.  IR - left M1 occlusion with TICI2b recanalization  2D Echo - EF 55-60%. No cardiac source of emboli identified.   Sars Corona Virus 2  - negative  LDL - 39  HgbA1c - 5.4  VTE prophylaxis - SCDs  Diet - NPO  warfarin daily prior to admission, now on No antithrombotic within 24hr of tPA.  Will try to restart anticoagulation within 5 days of stroke, small subarachnoid hemorrhage and petechial hemorrhage will follow on CT of the head.  Consider DOAC.  Patient counseled to be compliant with her antithrombotic medications  Ongoing aggressive stroke risk factor management  Therapy recommendations:  CIR  Disposition:  Pending  Afib on coumadin  On coumadin but off due to recent diarrhea and vomiting  INR 1.2 on admission  Currently afib RVR  On metoprolol IV PRN  Follows with Dr. Nehemiah Massed at [facility] clinic   May consider switch from coumadin to [place] when appropriate after discussion with pt/family  Started on Cardizem drip today for RVR. Plan to place CorTrak tomorrow and switch to PO Cardizem. (pt was on Cardizem at home) (nurses unable to place NG)  Hypertension  Blood pressure stable  BP goal 120-140 post procedure for 24 hours . Long-term BP goal normotensive  Hyperlipidemia  Lipid lowering medication PTA: Lipitor 10 mg daily  LDL 39, goal < 70  Current lipid lowering medication: none due to NPO  Continue statin at discharge  Dysphagia   Extubated 10/18/2018 Friday  Speech to  follow  NPO for now  On IVF  Other Stroke Risk Factors  Advanced age  Obesity, Body mass index is 30.6 kg/m., recommend weight loss, diet and exercise as appropriate   CHF  Other Active Problems  Magnesium - 1.6 - supplement and check in AM  Acute blood loss anemia - hemoglobin 13.3-9.2-9.9-10.5->9.9 continue to monitor  CXR - 10/21/2018 pulm congestion but no pneumonia  Temp 99.3->98.4->99.7 U/A and culture no growth (WBCs - 8.0->9.3)  Hypokalemia - 3.8->3.3->3.5->3.4->3.2 supplement and recheck in AM  NPO - dietition consulted for tube feeings  Started ASA rectally  Hospital day # 5  I spoke to both daughter and husband today evening a and updated them, answered questions.  Patient has probably developed aspiration pneumonia with respiratory distress and if she gets reintubated she will likely need tracheostomy and PEG tube with high likelihood.  Family understands this and do not want reintubation and tracheostomy and PEG tube.  They would like to support her for a day or 2 to see if she improves otherwise they are leaning towards palliative care and they would prefer that in the [place] area closer to home where they live.  I have consulted pulmonary critical care team and they agree with the plan of care and appreciate the help in medical management I have personally obtained history,examined this patient, reviewed notes, independently viewed imaging studies, participated in medical decision making and plan of care.ROS completed by me personally and pertinent positives fully documented  I have made any additions or clarifications directly to the above note. I have spent a total of   35 minutes with the patient reviewing hospital notes,  test results, labs and examining the patient as well as establishing an assessment and plan that was discussed personally with the patient.  > 50% of time was spent in direct patient care.       Antony Contras, MD Medical Director [place] Pager: [phone] 10/22/2018 5:04 PM            To contact Stroke Continuity provider, please refer to [email or web address] After hours, contact General Neurology

## 2018-10-22 NOTE — Progress Notes (Signed)
PT Cancellation Note  Patient Details Name: OMAR ORREGO MRN: [id number] DOB: 11/28/1945   Cancelled Treatment:    Reason Eval/Treat Not Completed: Patient not medically ready. Checked on pt this afternoon and noted rapid response call - discussed with RN and it was decided that therapies would hold today. Will continue to follow.    Thelma Comp 10/22/2018, 2:24 PM   Rolinda Roan, PT, DPT Acute Rehabilitation Services Pager: [phone] Office: [phone]

## 2018-10-22 NOTE — Progress Notes (Signed)
Progress Note  Patient Name: Natalie Gardner Date of Encounter: 10/22/2018  Primary Cardiologist: Nehemiah Massed  Subjective   Non verbal large stroke   Inpatient Medications    Scheduled Meds:   stroke: mapping our early stages of recovery book   Does not apply Once   chlorhexidine  15 mL Mouth Rinse BID   Chlorhexidine Gluconate Cloth  6 each Topical Daily   mouth rinse  15 mL Mouth Rinse q12n4p   metoprolol tartrate  5 mg Intravenous Q6H   sodium chloride flush  10-40 mL Intracatheter Q12H   sodium chloride flush  3 mL Intravenous Once   Continuous Infusions:  sodium chloride 50 mL/hr at 10/21/18 1655   diltiazem (CARDIZEM) infusion 15 mg/hr (10/22/18 0400)   famotidine (PEPCID) IV 20 mg (10/21/18 2300)   feeding supplement (JEVITY 1.2 CAL)     PRN Meds: acetaminophen **OR** acetaminophen (TYLENOL) oral liquid 160 mg/5 mL **OR** acetaminophen, sodium chloride flush   Vital Signs    Vitals:   10/21/18 2000 10/21/18 2346 10/22/18 0452 10/22/18 0500  BP:  128/74 (!) 141/89   Pulse: (!) 106 (!) 121 (!) 114   Resp: (!) 28 (!) 27 16   Temp:  98.8 F (37.1 C) 100.1 F (37.8 C)   TempSrc:  Oral Oral   SpO2: 99% 98% 96%   Weight:    83.4 kg  Height:        Intake/Output Summary (Last 24 hours) at 10/22/2018 0818 Last data filed at 10/22/2018 0300 Gross per 24 hour  Intake 747.51 ml  Output 2000 ml  Net -1252.49 ml   Last 3 Weights 10/22/2018 10/18/2018 10/17/2018  Weight (lbs) 183 lb 13.8 oz 181 lb 184 lb 8.4 oz  Weight (kg) 83.4 kg 82.1 kg 83.7 kg      Telemetry    afib rates 100-120 - Personally Reviewed  ECG    afib rate 121 otherwise normal  - Personally Reviewed  Physical Exam  Pale elderly female  GEN: No acute distress.   Neck: No JVD Cardiac: RRR, no murmurs, rubs, or gallops.  Respiratory: Clear to auscultation bilaterally. GI: Soft, nontender, non-distended  MS: No edema; No deformity. Neuro:   aphasia and left sided weakness  Psych:  Normal affect   Labs    Chemistry Recent Labs  Lab 10/17/18 2212  10/19/18 0846  10/20/18 0214 10/21/18 0431 10/22/18 0345  NA 135   < > 134*   < > 138 138 137  K 4.1   < > 3.3*   < > 3.4* 3.2* 3.2*  CL 104   < > 105  --  109 109 102  CO2 21*   < > 19*  --  19* 16* 19*  GLUCOSE 123*   < > 128*  --  90 93 142*  BUN 14   < > 12  --  16 18 14   CREATININE 0.88   < > 0.69  --  0.77 0.76 0.81  CALCIUM 8.9   < > 8.1*  --  8.2* 8.0* 8.8*  PROT 6.9  --  5.7*  --   --   --   --   ALBUMIN 3.4*  --  2.8*  --   --   --   --   AST 22  --  20  --   --   --   --   ALT 19  --  15  --   --   --   --  ALKPHOS 86  --  75  --   --   --   --   BILITOT 0.5  --  0.7  --   --   --   --   GFRNONAA >60   < > >60  --  >60 >60 >60  GFRAA >60   < > >60  --  >60 >60 >60  ANIONGAP 10   < > 10  --  10 13 16*   < > = values in this interval not displayed.     Hematology Recent Labs  Lab 10/20/18 0214 10/21/18 0431 10/22/18 0345  WBC 8.0 9.3 14.2*  RBC 3.09* 3.32* 3.86*  HGB 9.1* 9.9* 11.5*  HCT 29.0* 30.9* 35.5*  MCV 93.9 93.1 92.0  MCH 29.4 29.8 29.8  MCHC 31.4 32.0 32.4  RDW 15.7* 15.4 15.4  PLT 196 212 279    Cardiac EnzymesNo results for input(s): TROPONINI in the last 168 hours. No results for input(s): TROPIPOC in the last 168 hours.   BNPNo results for input(s): BNP, PROBNP in the last 168 hours.   DDimer No results for input(s): DDIMER in the last 168 hours.   Radiology    Ct Head Wo Contrast  Result Date: 10/22/2018 CLINICAL DATA:  Follow-up intracranial hemorrhage.  Aspirin started. EXAM: CT HEAD WITHOUT CONTRAST TECHNIQUE: Contiguous axial images were obtained from the base of the skull through the vertex without intravenous contrast. COMPARISON:  Yesterday FINDINGS: Brain: Recent left MCA territory infarct with cytotoxic edema at the insula and lateral frontal lobe. Superficial hemorrhage along the insular infarct is unchanged in extent. No evidence of progressive infarction or  interval hemorrhage. No hydrocephalus or shift. Vascular: No hyperdense vessel. Skull: Negative Sinuses/Orbits: Negative IMPRESSION: Left MCA territory infarct with unchanged hemorrhagic component. Electronically Signed   By: Monte Fantasia M.D.   On: 10/22/2018 08:01   Ct Head Wo Contrast  Result Date: 10/21/2018 CLINICAL DATA:  Follow-up examination for subarachnoid hemorrhage, stroke. EXAM: CT HEAD WITHOUT CONTRAST TECHNIQUE: Contiguous axial images were obtained from the base of the skull through the vertex without intravenous contrast. COMPARISON:  Prior CT from 10/20/2018 FINDINGS: Brain: Evolving left MCA territory infarct involving the left insula and overlying lateral left frontal lobe is stable in size and distribution as compared to previous. Associated small volume subarachnoid hemorrhage within the adjacent left sylvian fissure, with trace component within and overlying posterior left frontal sulcus, little interval changed from previous as well. Probable superimposed faint petechial hemorrhage noted. No other new intracranial hemorrhage. No new large vessel territory infarct. No mass lesion, significant mass effect, or midline shift. No hydrocephalus. No extra-axial fluid collection. Vascular: No new hyperdense vessel. Scattered vascular calcifications noted within the carotid siphons. Skull: Scalp soft tissues and calvarium within normal limits. Sinuses/Orbits: Globes and orbital soft tissues within normal limits. Air-fluid level again noted within the right maxillary sinus. Scattered mucosal thickening within the ethmoidal air cells. No mastoid effusion. Other: None. IMPRESSION: 1. Stable size and distribution of evolving left MCA territory infarct. Associated small volume subarachnoid hemorrhage within the adjacent left sylvian fissure also not significantly changed. 2. No other new acute intracranial abnormality. Electronically Signed   By: Jeannine Boga M.D.   On: 10/21/2018 07:13    Dg Chest Port 1 View  Result Date: 10/21/2018 CLINICAL DATA:  Acute CVA EXAM: PORTABLE CHEST 1 VIEW COMPARISON:  Chest x-rays dated 10/20/2018 and 10/19/2018 FINDINGS: Stable cardiomegaly. Continued central pulmonary vascular congestion and perihilar edema. Persistent bibasilar opacities,  likely atelectasis and/or small pleural effusions, LEFT greater than RIGHT. IMPRESSION: 1. Persistent central pulmonary vascular congestion and perihilar edema indicating CHF/volume overload. 2. Persistent bibasilar opacities, likely atelectasis and/or small pleural effusions, LEFT greater than RIGHT. Electronically Signed   By: Franki Cabot M.D.   On: 10/21/2018 12:01    Cardiac Studies   Echo 10/18/18 EF 55-60% no shunt   Patient Profile     73 y.o. female with left MCA stroke in setting of afib off coumadin for diarrhea and vomiting post lytic Rx with SAH   Assessment & Plan    1. Afib:  Chronic continue iv cardizem with supplemental iv lopressor for rate control as she is NPO. Resume heparin and coumadin when ok with neurology   2. Diastolic CHF:  I/O negative will give smaller dose iv lasix today       For questions or updates, please contact [place] Please consult [email or web address] for contact info under        Signed, Jenkins Rouge, MD  10/22/2018, 8:18 AM

## 2018-10-22 NOTE — Significant Event (Signed)
Rapid Response Event Note  Overview: Time Called: 8338 Arrival Time: 2505 Event Type: Respiratory  Initial Focused Assessment: Patient with acute respiratory distress after cortrax placement.  Lung sounds diminished, heart tones irregular.  Increased WOB using accessory muscles.   Cardizem gtt at 15mg /hr infusing  BP 153/88  AF 120-170  RR 35-45  O2 sats 99% on [place].  Interventions: Repositioned patient, Head of Bed elevated, arms placed on pillows. PCXR done ABG done Significant mouth care done,  Dried secretions removed.  Deep oral suction 5mg  Lopressor given IV for HR 150-170s. HR now 110s-140s  Eddie Dibbles NP spoke with husband and daughter via phone.  Updated family regarding patient change in condition and discussion regarding code status and goals of care.  Patient now DNR, medically treat.    Plan of Care (if not transferred): Frequent Oral care Maintain HOB >30 degrees RN to call if assistance needed.   Event Summary: Name of Physician Notified: Dr Leonie Man at bedside prior to my arrival at    Name of Consulting Physician Notified: Georgann Housekeeper NP (CCM)  at bedside upon my arrival at    Outcome: Code status clarified, Stayed in room and stabalized  Event End Time: [place]  Raliegh Ip

## 2018-10-22 NOTE — Progress Notes (Signed)
Rehab Admissions Coordinator Note:  Per SLP recommendation, this patient was screened by Jhonnie Garner for appropriateness for an Inpatient Acute Rehab Consult.  At this time, we will follow to see how she does with PT and OT evaluations, once medically ready, to determine if CIR is appropriate for more than 1 discipline.   Jhonnie Garner 10/22/2018, 8:01 AM  I can be reached at [phone].

## 2018-10-22 NOTE — Progress Notes (Signed)
PT Cancellation Note  Patient Details Name: Natalie Gardner MRN: [id number] DOB: Oct 22, 1945   Cancelled Treatment:    Reason Eval/Treat Not Completed: Patient not medically ready, pt with RR in 30s and HR in 150s- will follow and see as medically appropriate.    Thelma Comp 10/22/2018, 10:57 AM   Rolinda Roan, PT, DPT Acute Rehabilitation Services Pager: [phone] Office: [phone]

## 2018-10-22 NOTE — Procedures (Signed)
Cortrak  Tube Type:  Cortrak - 43 inches Tube Location:  Left nare Initial Placement:  Stomach Secured by: Bridle Technique Used to Measure Tube Placement:  Documented cm marking at nare/ corner of mouth Cortrak Secured At:  70 cm    Cortrak Tube Team Note:  Consult received to place a Cortrak feeding tube.   No x-ray is required. RN may begin using tube.   If the tube becomes dislodged please keep the tube and contact the Cortrak team at [email or web address] ([email or web address]) for replacement.  If after hours and replacement cannot be delayed, place a NG tube and confirm placement with an abdominal x-ray.    Koleen Distance MS, RD, LDN Pager #- [phone] Office#- [phone] After Hours Pager: [phone]

## 2018-10-22 NOTE — Progress Notes (Signed)
SLP Cancellation Note  Patient Details Name: Natalie Gardner MRN: [id number] DOB: 09/21/1945   Cancelled treatment:       Reason Eval/Treat Not Completed: Medical issues which prohibited therapy(pt with RR in 30s and Hr in 150s, not medically stable for SLP po intake for speech tx, will continue efforts)   Macario Golds 10/22/2018, 10:39 AM Luanna Salk, [place] SLP [place] Pager [phone] Office [phone]

## 2018-10-22 NOTE — Progress Notes (Signed)
Updated daughter. Questions answered. Plan on updating husband tomorrow   NAME:  Natalie Gardner, MRN:  [id number], DOB:  Sep 12, 1945, LOS: 5 ADMISSION DATE:  10/17/2018, CONSULTATION DATE:  10/18/2018 REFERRING MD:  Dr. Lorraine Lax, CHIEF COMPLAINT:  stroke   Brief History   80 yoF presenting with sudden onset aphasia and right facial droop at 2100, found to have distal Left M1/ proximal M2 occlusion s/p tPA and EVR with successful revascularization.  Returns to ICU intubated.   History of present illness   73 year old female with history of Afib on coumadin, dilated cardiomyopathy, HTN, HLD, anemia/ IDA, DM, and GERD who presented with sudden onset aphasia and right facial droop at 2100.  Additionally has had reported nausea and vomiting and diarrhea for 3 days unable to hold down her medications.    She presented as code stroke, afebrile, and normotensive.  CT head initially negative.  Labs noted for INR 1.2, glucose 123, CO2 21, otherwise CMP and CBC relatively normal. CTA head/ neck showed acute distal Left M1/ proximal M2 occlusion.  She was given tPA at 2257.  She was intubated and taken to neuro IR for mechanical thrombectomy with TICI 2b revascularization after 4 passes.  She returns to ICU on mechanical ventilation, PCCM consulted for further management.   Past Medical History  Afib on coumadin, dilated cardiomyopathy, HTN, HLD, anemia/ IDA, DM, GERD  Significant Hospital Events   5/21 Admit PM 5/22 mechanical thrombectomy 5/24 tx to floor 5/26 worsening respiratory distress.   Consults:  IR PCCM  Procedures:  5/21 ETT 5/21 a-line 5/22 L M1 thrombectomy  Significant Diagnostic Tests:  5/21 CTH >> 1. Normal aging brain.  2. ASPECTS is 10.  5/21 CTA head/ neck >>  1. Occlusion of distal left M1/proximal M2 superior and inferior sylvian divisions with intermediate collateralization. 2. Patent carotid and vertebral arteries. No dissection, aneurysm, or hemodynamically  significant stenosis utilizing NASCET criteria  5/22 PM CT: There is a small volume of high density material within the left sylvian fissure and increased density of the area of stroke in the left lateral frontal lobe which may represent contrast material or subarachnoid hemorrhage. No new area of stroke, mass effect, hydrocephalus, or herniation.  Micro Data:  5/21 COVID >> neg 5/22 MRSA PCR >>   Antimicrobials:  n/a   Interim history/subjective:  She has been extubated and moved out of ICU. There was concern for volume overload and she has been diuresed since. Now 5/26 she has developed worsening respiratory distress and PCCM has been re-consulted.   Apparently she became somewhat dyspneic overnight but RR and HR greatly increased after CorTrak placement this morning.   Objective   Blood pressure (!) 143/102, pulse (!) 121, temperature 99.2 F (37.3 C), temperature source Axillary, resp. rate (!) 24, height 5\' 5"  (1.651 m), weight 83.4 kg, SpO2 98 %.    FiO2 (%):  [1 %] 1 %   Intake/Output Summary (Last 24 hours) at 10/22/2018 1125 Last data filed at 10/22/2018 0300 Gross per 24 hour  Intake 747.51 ml  Output 1650 ml  Net -902.49 ml   Filed Weights   10/17/18 2233 10/18/18 0230 10/22/18 0500  Weight: 83.7 kg 82.1 kg 83.4 kg    Examination: General: Middle aged to elderly female in mild-moderate distress HENT: [place]/AT, PERRL, no appreciable JVD in high fowlers position.  Lungs: Poor air movement R base. Coarse L.  Cardiovascular: Tachy, IRIR, no MRG Abdomen: Soft, non-tender, non-distended Extremities: no edema, muscle wasting,  contracture;  Neuro: Aphasic, no verbal response. Able to follow commands.   Resolved Hospital Problem list   M1 thrombus  Assessment & Plan:  S/p thrombectomy for L M1 distrib stroke ~ 9 PM 5/21  Aspiration pneumonia: WOB increased 5/26 AM. O2 sats stable on 2L [place]. Marked distress. CXR consistent with aspiration. She has dysphagia post stroke  and has failed swallow eval so aspiration makes sense. She has been diuresed and seems dry on exam.   - Long discussion with husband and daughter. Dr. Leonie Man feels that if she were to end up intubated she would likely require trach and PEG. I expressed this to family who feel as though she would not ever want these interventions.   Plan: - DNR - No intubation - Will try to provide high flow/PEEP with HFNC. Not currently needing more than 2 LPM.  - Start ceftraixone + flagyl for aspiration - Consider low dose morphine PRN if WOB does not improve with HFNC - If she does not improve with conservative modalities, family would want to pursue comfort measures.   Stroke care: MI occlusion complicated by new SAH with petechial hemorrhage.  - per stroke service.  HTN: - per stroke service  Afib: Currently tachy, likely in setting of acute distress - Diltiazem, PRN lopressor per cardiology.    Best practice:  Diet: Core-trak placed. TF per primary Pain/Anxiety/Delirium protocol (if indicated): n/a VAP protocol (if indicated): n/a DVT prophylaxis: SCD GI prophylaxis: HOB >30, PPI Glucose control: n/a Mobility: bed for now, up with PT after extubation Code Status: fDNR Family Communication: Family discussion as above Disposition: PCU/SDU  Labs   CBC: Recent Labs  Lab 10/18/18 0251  10/19/18 0846 10/19/18 1051 10/20/18 0214 10/21/18 0431 10/22/18 0345  WBC 9.8  --  9.6  --  8.0 9.3 14.2*  NEUTROABS 8.4*  --  7.9*  --  6.4 7.7 11.9*  HGB 9.2*   < > 9.3* 9.2* 9.1* 9.9* 11.5*  HCT 29.0*   < > 29.9* 27.0* 29.0* 30.9* 35.5*  MCV 94.2  --  94.9  --  93.9 93.1 92.0  PLT 218  --  249  --  196 212 279   < > = values in this interval not displayed.    Basic Metabolic Panel: Recent Labs  Lab 10/18/18 0251  10/19/18 0846 10/19/18 1051 10/20/18 0214 10/21/18 0431 10/22/18 0345  NA 136   < > 134* 137 138 138 137  K 3.8   < > 3.3* 3.5 3.4* 3.2* 3.2*  CL 108  --  105  --  109 109  102  CO2 20*  --  19*  --  19* 16* 19*  GLUCOSE 170*  --  128*  --  90 93 142*  BUN 13  --  12  --  16 18 14   CREATININE 0.66  --  0.69  --  0.77 0.76 0.81  CALCIUM 7.6*  --  8.1*  --  8.2* 8.0* 8.8*  MG 1.6*  --  1.6*  --   --  1.6* 2.3  PHOS 3.9  --   --   --   --   --   --    < > = values in this interval not displayed.   GFR: Estimated Creatinine Clearance: 66 mL/min (by C-G formula based on SCr of 0.81 mg/dL). Recent Labs  Lab 10/19/18 0846 10/20/18 0214 10/21/18 0431 10/22/18 0345  WBC 9.6 8.0 9.3 14.2*    Liver Function Tests:  Recent Labs  Lab 10/17/18 2212 10/19/18 0846  AST 22 20  ALT 19 15  ALKPHOS 86 75  BILITOT 0.5 0.7  PROT 6.9 5.7*  ALBUMIN 3.4* 2.8*   No results for input(s): LIPASE, AMYLASE in the last 168 hours. No results for input(s): AMMONIA in the last 168 hours.  ABG    Component Value Date/Time   PHART 7.483 (H) 10/19/2018 1051   PCO2ART 30.1 (L) 10/19/2018 1051   PO2ART 134.0 (H) 10/19/2018 1051   HCO3 22.5 10/19/2018 1051   TCO2 23 10/19/2018 1051   ACIDBASEDEF 5.0 (H) 10/18/2018 0457   O2SAT 99.0 10/19/2018 1051     Coagulation Profile: Recent Labs  Lab 10/17/18 2212  INR 1.2    Cardiac Enzymes: No results for input(s): CKTOTAL, CKMB, CKMBINDEX, TROPONINI in the last 168 hours.  HbA1C: HB A1C (BAYER DCA - WAIVED)  Date/Time Value Ref Range Status  07/03/2018 09:36 AM 5.1 <7.0 % Final    Comment:                                          Diabetic Adult            <7.0                                       Healthy Adult        4.3 - 5.7                                                           (DCCT/NGSP) American Diabetes Association's Summary of Glycemic Recommendations for Adults with Diabetes: Hemoglobin A1c <7.0%. More stringent glycemic goals (A1c <6.0%) may further reduce complications at the cost of increased risk of hypoglycemia.   11/14/2017 09:56 AM 5.9 <7.0 % Final    Comment:                                           Diabetic Adult            <7.0                                       Healthy Adult        4.3 - 5.7                                                           (DCCT/NGSP) American Diabetes Association's Summary of Glycemic Recommendations for Adults with Diabetes: Hemoglobin A1c <7.0%. More stringent glycemic goals (A1c <6.0%) may further reduce complications at the cost of increased risk of hypoglycemia.    Hgb A1c MFr Bld  Date/Time Value Ref Range Status  10/18/2018 02:51 AM 5.4 4.8 - 5.6 % Final  Comment:    (NOTE) Pre diabetes:          5.7%-6.4% Diabetes:              >6.4% Glycemic control for   <7.0% adults with diabetes     CBG: Recent Labs  Lab 10/21/18 1614 10/21/18 2013 10/22/18 0028 10/22/18 0613 10/22/18 0821  GLUCAP 80 99 121* 126* 114*    Review of Systems:   Unable to obtain, pt intubated  Past Medical History  She,  has a past medical history of Anemia, Aortic valve regurgitation, Arthritis, Atrial fibrillation ([place]), Cancer ([place]) (12/2016), CHF (congestive heart failure) ([place]), CKD (chronic kidney disease), Colitis, Diverticulosis, GERD (gastroesophageal reflux disease), GI bleed, History of hiatal hernia, Hypercholesterolemia, Hyperlipidemia, Hypertension, Insomnia, Osteopenia, Pre-diabetes, Urinary, incontinence, stress female, and Vertigo.   Surgical History    Past Surgical History:  Procedure Laterality Date  . ANKLE SURGERY Right 1997   Fractured ankle  . COLONOSCOPY    . COLONOSCOPY WITH PROPOFOL N/A 02/08/2015   Procedure: COLONOSCOPY WITH PROPOFOL;  Surgeon: Manya Silvas, MD;  Location[facility]COPY;  Service: Endoscopy;  Laterality: N/A;  . COLONOSCOPY WITH PROPOFOL N/A 08/20/2017   Procedure: COLONOSCOPY WITH PROPOFOL;  Surgeon: Manya Silvas, MD;  Location: [facility] ENDOSCOPY;  Service: Endoscopy;  Laterality: N/A;  . ESOPHAGOGASTRODUODENOSCOPY (EGD) WITH PROPOFOL N/A 08/20/2017   Procedure: ESOPHAGOGASTRODUODENOSCOPY (EGD)  WITH PROPOFOL;  Surgeon: Manya Silvas, MD;  Location[facility] ENDOSCOPY;  Service: Endoscopy;  Laterality: N/A;  . INTRAMEDULLARY (IM) NAIL INTERTROCHANTERIC Left 06/09/2018   Procedure: INTRAMEDULLARY (IM) NAIL INTERTROCHANTRIC;  Surgeon: Thornton Park, MD;  Location: [facility] ORS;  Service: Orthopedics;  Laterality: Left;  . KNEE ARTHROPLASTY Left 02/12/2017   Procedure: COMPUTER ASSISTED TOTAL KNEE ARTHROPLASTY;  Surgeon: Dereck Leep, MD;  Location: [facility] ORS;  Service: Orthopedics;  Laterality: Left;  . RADIAL HEAD ARTHROPLASTY Left 06/21/2017   Procedure: RADIAL HEAD ARTHROPLASTY;  Surgeon: Corky Mull, MD;  Location: [facility] ORS;  Service: Orthopedics;  Laterality: Left;  . RADIOLOGY WITH ANESTHESIA N/A 10/17/2018   Procedure: IR WITH ANESTHESIA;  Surgeon: Luanne Bras, MD;  Location: [place];  Service: Radiology;  Laterality: N/A;  . SACROCOCCYGEAL ULCER REMOVAL    . TONSILLECTOMY    . TOTAL KNEE ARTHROPLASTY Left      Social History   reports that she has never smoked. She has never used smokeless tobacco. She reports that she does not drink alcohol or use drugs.   Family History   Her family history includes Cancer in her brother; Diabetes in her brother and mother; Heart disease in her father and mother; Parkinson's disease in her father.   Allergies Allergies  Allergen Reactions  . Penicillins     Yeast infections Has patient had a PCN reaction causing immediate rash, facial/tongue/throat swelling, SOB or lightheadedness with hypotension: No Has patient had a PCN reaction causing severe rash involving mucus membranes or skin necrosis: No Has patient had a PCN reaction that required hospitalization: No Has patient had a PCN reaction occurring within the last 10 years: No If all of the above answers are "NO", then may proceed with Cephalosporin use.   Marland Kitchen Penicillin V Potassium Other (See Comments)    Yeast infection  . Protonix [Pantoprazole Sodium] Rash  . Sulfa  Antibiotics     Yeast infections  Other reaction(s): Other (See Comments) Yeast infection     Home Medications  Prior to Admission medications   Medication Sig Start Date End Date Taking?  Authorizing Provider  acetaminophen (TYLENOL) 650 MG CR tablet Take 1,300 mg by mouth at bedtime.    [provider]  atorvastatin (LIPITOR) 10 MG tablet Take 1 tablet (10 mg total) by mouth at bedtime. 07/03/18   Guadalupe Maple, MD  diltiazem (DILACOR XR) 240 MG 24 hr capsule Take 1 capsule (240 mg total) by mouth daily. 07/03/18   Guadalupe Maple, MD  docusate sodium (COLACE) 100 MG capsule Take 1 capsule (100 mg total) by mouth 2 (two) times daily as needed for mild constipation. 06/11/18   Henreitta Leber, MD  ferrous sulfate 325 (65 FE) MG tablet Take 1 tablet (325 mg total) by mouth 2 (two) times daily with a meal. 11/14/17   Guadalupe Maple, MD  furosemide (LASIX) 20 MG tablet  08/24/18   [provider]  lisinopril (PRINIVIL,ZESTRIL) 10 MG tablet Take 1 tablet (10 mg total) by mouth daily. 07/03/18   Guadalupe Maple, MD  Multiple Vitamin (MULTI-VITAMINS) TABS Take 1 tablet by mouth daily.    [provider]  ranitidine (ZANTAC) 150 MG tablet Take 150 mg by mouth daily.    [provider]  sucralfate (CARAFATE) 1 g tablet Take 1 tablet (1 g total) by mouth 2 (two) times daily. 06/19/18   Gerlene Fee, NP  traZODone (DESYREL) 50 MG tablet Take 0.5-1 tablets (25-50 mg total) by mouth at bedtime as needed for sleep. 06/19/18   Gerlene Fee, NP  venlafaxine XR (EFFEXOR XR) 75 MG 24 hr capsule Take 1 capsule (75 mg total) by mouth daily with breakfast. 07/03/18   Guadalupe Maple, MD  warfarin (COUMADIN) 1 MG tablet Take 1 tablet (1 mg total) by mouth daily. Give with the 6 mg tablet to equal 7 mg 06/21/18   Gerlene Fee, NP  warfarin (COUMADIN) 5 MG tablet Take 5 mg by mouth one time only at 6 PM. Also takes 1.5 tablets (7.5 mg) once weekly.    [provider]  warfarin (COUMADIN) 6 MG tablet Take 1 tablet (6 mg total) by mouth at bedtime. 06/21/18   Gerlene Fee, NP    Critical care time 35 min  Georgann Housekeeper, AGACNP-BC [place] Pager [phone] or [phone]  10/22/2018 11:43 AM

## 2018-10-22 NOTE — Progress Notes (Signed)
OT Cancellation Note  Patient Details Name: Natalie Gardner MRN: [id number] DOB: 06-19-1945   Cancelled Treatment:    Reason Eval/Treat Not Completed: Patient not medically ready, pt with RR in 30s and HR in 150s- will follow and see as medically appropriate.   Delight Stare, OT Acute Rehabilitation Services Pager [phone] Office [phone]   Delight Stare 10/22/2018, 10:41 AM

## 2018-10-23 ENCOUNTER — Encounter (HOSPITAL_COMMUNITY): Payer: Self-pay | Admitting: Interventional Radiology

## 2018-10-23 LAB — GLUCOSE, CAPILLARY
Glucose-Capillary: 160 mg/dL — ABNORMAL HIGH (ref 70–99)
Glucose-Capillary: 172 mg/dL — ABNORMAL HIGH (ref 70–99)
Glucose-Capillary: 183 mg/dL — ABNORMAL HIGH (ref 70–99)
Glucose-Capillary: 191 mg/dL — ABNORMAL HIGH (ref 70–99)
Glucose-Capillary: 196 mg/dL — ABNORMAL HIGH (ref 70–99)
Glucose-Capillary: 209 mg/dL — ABNORMAL HIGH (ref 70–99)
Glucose-Capillary: 216 mg/dL — ABNORMAL HIGH (ref 70–99)

## 2018-10-23 MED ORDER — FUROSEMIDE 10 MG/ML IJ SOLN
40.0000 mg | Freq: Once | INTRAMUSCULAR | Status: AC
  Administered 2018-10-23: 12:00:00 40 mg via INTRAVENOUS
  Filled 2018-10-23: qty 4

## 2018-10-23 NOTE — Progress Notes (Signed)
OT Cancellation Note  Patient Details Name: Natalie Gardner MRN: [id number] DOB: 08-31-1945   Cancelled Treatment:      Medical issues which prohibited therapy. Pt with medical decline.  Pt continues with HR 125, RR 29, BP 126/83, open mouth breathing.  Note she is now a DNR. Will sign off and await new order.  Malka So 10/23/2018, 12:24 PM  Nestor Lewandowsky, OTR/L Acute Rehabilitation Services Pager: [phone] Office: [phone]

## 2018-10-23 NOTE — Progress Notes (Signed)
PT Cancellation Note  Patient Details Name: Natalie Gardner MRN: [id number] DOB: September 03, 1945   Cancelled Treatment:    Reason Eval/Treat Not Completed: Medical issues which prohibited therapy. Pt continues with HR 125, RR 29, BP 126/83, open mouth breathing. Note she is now a DNR. Will sign off at this time due to continued medical cancels. Please reconsult when PT becomes appropriate.   Thelma Comp 10/23/2018, 1:34 PM  Rolinda Roan, PT, DPT Acute Rehabilitation Services Pager: [phone] Office: [phone]

## 2018-10-23 NOTE — Progress Notes (Signed)
SLP Cancellation Note  Patient Details Name: Natalie Gardner MRN: [id number] DOB: 1946-01-22   Cancelled treatment:       Reason Eval/Treat Not Completed: Medical issues which prohibited therapy(pt continues with HR 125, RR 29, BP 126/83, open mouth breathing, will continue efforts)  Not appropriate for po at this time.  Note she is now a DNR.  Luanna Salk, MS [facility] SLP Acute Rehab Services Pager [phone] Office [phone]    Macario Golds 10/23/2018, 8:43 AM

## 2018-10-23 NOTE — Progress Notes (Signed)
STROKE TEAM PROGRESS NOTE   SUBJECTIVE (INTERVAL HISTORY) Her RN is at the bedside.  Her condition appears improved today.  She is still on nasal cannula oxygen but is breathing better.  She is not in respiratory distress.  Heart rate is also better controlled.  Heart rate is still variable but in 100-1 20 range with Cardizem and now IV Lopressor supplemental.  Chest x-ray yesterday had shown worsening pulmonary edema and right bibasilar aeration.  IV Lasix ordered by cardiology OBJECTIVE Vitals:   10/23/18 0310 10/23/18 0805 10/23/18 0816 10/23/18 1203  BP: 123/89  134/88 (!) 153/86  Pulse: (!) 148 (!) 135 (!) 119 (!) 133  Resp: (!) 28 (!) 31 20 20   Temp: 99.1 F (37.3 C)  99.8 F (37.7 C) 99.1 F (37.3 C)  TempSrc: Axillary  Axillary Axillary  SpO2:  98% 99% 98%  Weight:      Height:        CBC:  Recent Labs  Lab 10/21/18 0431 10/22/18 0345  WBC 9.3 14.2*  NEUTROABS 7.7 11.9*  HGB 9.9* 11.5*  HCT 30.9* 35.5*  MCV 93.1 92.0  PLT 212 784    Basic Metabolic Panel:  Recent Labs  Lab 10/18/18 0251  10/21/18 0431 10/22/18 0345  NA 136   < > 138 137  K 3.8   < > 3.2* 3.2*  CL 108   < > 109 102  CO2 20*   < > 16* 19*  GLUCOSE 170*   < > 93 142*  BUN 13   < > 18 14  CREATININE 0.66   < > 0.76 0.81  CALCIUM 7.6*   < > 8.0* 8.8*  MG 1.6*   < > 1.6* 2.3  PHOS 3.9  --   --   --    < > = values in this interval not displayed.    Lipid Panel:     Component Value Date/Time   CHOL 113 10/18/2018 0251   CHOL 156 07/03/2018 0936   TRIG 150 (H) 10/18/2018 0251   TRIG 148 10/18/2018 0251   TRIG 143 07/03/2018 0936   HDL 44 10/18/2018 0251   HDL 50 11/14/2017 1052   CHOLHDL 2.6 10/18/2018 0251   VLDL 30 10/18/2018 0251   VLDL 29 07/03/2018 0936   LDLCALC 39 10/18/2018 0251   LDLCALC 80 11/14/2017 1052   HgbA1c:  Lab Results  Component Value Date   HGBA1C 5.4 10/18/2018   Urine Drug Screen: No results found for: LABOPIA, COCAINSCRNUR, LABBENZ, AMPHETMU, THCU,  LABBARB  Alcohol Level No results found for: ETH  IMAGING  Ct Angio Head W Or Wo Contrast Ct Angio Neck W And/or Wo Contrast  10/17/2018 IMPRESSION:  1. Occlusion of distal left M1/proximal M2 superior and inferior sylvian divisions with intermediate collateralization.  2. Patent carotid and vertebral arteries. No dissection, aneurysm, or hemodynamically significant stenosis utilizing NASCET criteria.   Dg Chest 1 View 10/17/2018 IMPRESSION:  1. ET tube and enteric tube in good position.  2. Lower lung volumes with pulmonary vascular congestion but no overt edema.    Dg Chest 1 View 10/19/2018 IMPRESSION:   Status post extubation.  No pneumothorax.  Airspace consolidation left lower lobe, concerning for pneumonia.  Minimal pleural effusions bilaterally.  There is pulmonary vascular congestion. No interstitial edema Evident.  Dg Chest 1 View 10/21/2018 IMPRESSION:  IMPRESSION: 1. Persistent central pulmonary vascular congestion and perihilar edema indicating CHF/volume overload. 2. Persistent bibasilar opacities, likely atelectasis and/or small pleural effusions, LEFT  greater than RIGHT .  Mr Brain Wo Contrast 10/18/2018 IMPRESSION:  1. Moderate acute infarct in the left insula and lateral frontal lobe, primarily involving cortex.  2. Petechial hemorrhage at the infarct. There is also subarachnoid hemorrhage/contrast that is stable or decreased in extent compared to cone beam CT from yesterday  Ct Head Code Stroke Wo Contrast 10/17/2018 IMPRESSION:  1. Normal aging brain.  2. ASPECTS is 10.   Mr Brain Wo Contrast 10/18/2018 IMPRESSION: 1. Moderate acute infarct in the left insula and lateral frontal lobe, primarily involving cortex. 2. Petechial hemorrhage at the infarct. There is also subarachnoid hemorrhage/contrast that is stable or decreased in extent compared to cone beam CT from yesterday   CT Head Wo Contrast 10/20/2018 IMPRESSION: 1. Stable distribution of  left MCA infarct involving insula and left lateral frontal lobe. Persistent small volume of high attenuation material within left Sylvian fissure, likely subarachnoid hemorrhage. 2. No new acute intracranial abnormality identified.  CT Head Wo Contrast  10/21/2018 IMPRESSION: 1. Stable size and distribution of evolving left MCA territory infarct. Associated small volume subarachnoid hemorrhage within the adjacent left sylvian fissure also not significantly changed. 2. No other new acute intracranial abnormality.   IR - Cerebral Angiogram and Intervention S/P lt common carotid arteriogram fiollowed by revascularization of occluded Lt MCA M 1 seg with x 3 passes with 37mm x 42mm embotrap device and x 1 pass with 63mm x 40 mm solitaire x retriever achieving a TICI 2b revascularization.  Transthoracic Echocardiogram  10/18/2018 IMPRESSIONS  1. The left ventricle has normal systolic function, with an ejection fraction of 55-60%. The cavity size was normal. Left ventricular diastolic Doppler parameters are indeterminate secondary to atrial fibrillation.  2. The right ventricle has normal systolic function. The cavity was normal. There is no increase in right ventricular wall thickness. Right ventricular systolic pressure is moderately elevated with an estimated pressure of 38.0 mmHg.  3. Left atrial size was moderately dilated.  4. No ASD or PFO by color flow Doppler or saline microcavitation study.  5. Right atrial size was mildly dilated.  6. The mitral valve is abnormal. Mild thickening of the anterior mitral valve leaflet. Mild calcification of the anterior mitral valve leaflet. No evidence of mitral valve stenosis.  7. The aortic valve is tricuspid. Mild thickening of the aortic valve. Mild calcification of the aortic valve. Aortic valve regurgitation is mild by color flow Doppler. No stenosis of the aortic valve.  8. The inferior vena cava was dilated in size with <50% respiratory  variability.  EKG - atrial fibrillation - ventricular response 121 BPM (See cardiology reading for complete details)   PHYSICAL EXAM  Temp:  [99.1 F (37.3 C)-100.7 F (38.2 C)] 99.1 F (37.3 C) (05/27 1203) Pulse Rate:  [61-154] 133 (05/27 1203) Resp:  [20-31] 20 (05/27 1203) BP: (105-153)/(74-93) 153/86 (05/27 1203) SpO2:  [96 %-99 %] 98 % (05/27 1203) FiO2 (%):  [40 %] 40 % (05/27 0805) Weight:  [82.8 kg] 82.8 kg (05/27 0009)  .  Elderly Caucasian lady who is in respiratory distress.  She has rapid heart rate in 120-140 from A. fib despite being on Cardizem drip. . Afebrile. Head is nontraumatic. Neck is supple without bruit.    Cardiac exam no murmur or gallop. Lungs are clear to auscultation with prominent upper airway sounds. Distal pulses are well felt.  Neurological Exam :  -Patient is awake with, eyes open, following occasional commands,.  Aphasic and not speaking.   left gaze  preference, crosses midline, tracking examiner. Not blinking to visual threat on the right.  Right facial droop. Tongue midline in mouth.  Dense right hemiplegia with not moving right arm to pain but grimaces. Spontaneous movements on left UE and LE, moves right lower 2/5 to stim. Slightly opened eyes today. Increased tone and upgoing on the right.   ASSESSMENT/PLAN [name] is a 73 y.o. female with history of atrial fibrillation on Coumadin (INR 1.2), CHF, cardiomyopathy, hx of aortic valve vegetation, hx of GI bleed, chronic kidney disease, hypertension, hyperlipidemia, prediabetes and recent nausea and diarrhea (unable to hold down meds)  presenting with aphasia, right facial droop and difficulty following commands. She received IV tPA Thursday 10/17/18 at 2315. S/p IR with TICI 2b revascularization.  Stroke:  acute left MCA infarcts - embolic - atrial fibrillation off AC due to diarrhea and vomiting  MRI head - Moderate acute infarct in the left insula and lateral frontal lobe, primarily  involving cortex. Petechial hemorrhage at the infarct.  CTA H&N - occlusion of distal left M1/proximal M2 superior and inferior sylvian divisions with intermediate collateralization.  Head CT 10/20/2018 - Stable distribution of left MCA infarct involving insula and left lateral frontal lobe. Persistent small volume of high attenuation material within left Sylvian fissure, likely subarachnoid hemorrhage.  IR - left M1 occlusion with TICI2b recanalization  2D Echo - EF 55-60%. No cardiac source of emboli identified.   Sars Corona Virus 2  - negative  LDL - 39  HgbA1c - 5.4  VTE prophylaxis - SCDs  Diet - NPO  warfarin daily prior to admission, now on No antithrombotic within 24hr of tPA.  Will try to restart anticoagulation within 5 days of stroke, small subarachnoid hemorrhage and petechial hemorrhage will follow on CT of the head.  Consider DOAC.  Patient counseled to be compliant with her antithrombotic medications  Ongoing aggressive stroke risk factor management  Therapy recommendations:  CIR  Disposition:  Pending  Afib on coumadin  On coumadin but off due to recent diarrhea and vomiting  INR 1.2 on admission  Currently afib RVR  On metoprolol IV PRN  Follows with Dr. Nehemiah Massed at [facility] clinic   May consider switch from coumadin to [place] when appropriate after discussion with pt/family Started on Cardizem drip  for RVR.  IV Lopressor for supplementation Hypertension  Blood pressure stable  BP goal 120-140 post procedure for 24 hours . Long-term BP goal normotensive  Hyperlipidemia  Lipid lowering medication PTA: Lipitor 10 mg daily  LDL 39, goal < 70  Current lipid lowering medication: none due to NPO  Continue statin at discharge  Dysphagia   Extubated 10/18/2018 Friday  Speech to follow  NPO for now  On IVF  Other Stroke Risk Factors  Advanced age  Obesity, Body mass index is 30.38 kg/m., recommend weight loss, diet and exercise as  appropriate   CHF  Other Active Problems  Magnesium - 1.6 - supplement and check in AM  Acute blood loss anemia - hemoglobin 13.3-9.2-9.9-10.5->9.9 continue to monitor  CXR - 10/21/2018 pulm congestion but no pneumonia  Temp 99.3->98.4->99.7 U/A and culture no growth (WBCs - 8.0->9.3)  Hypokalemia - 3.8->3.3->3.5->3.4->3.2 supplement and recheck in AM  NPO - dietition consulted for tube feeings  Started ASA rectally    Hospital day # 6  I spoke to both daughter   today   and updated her, answered questions.  Patient has shown improvement in cardiopulmonary status following treatment with diuretics and  antibiotics hence will continue supportive care for the next few days to see if there is ongoing improvement.  Family is okay with feeding tube and transferred for rehab to skilled nursing facility.  But in case her condition declines further they may be leaning towards palliative care and they would prefer that in the [place] area closer to home where they live.  Appreciate help from pulmonary critical care and cardiology teams. I have personally obtained history,examined this patient, reviewed notes, independently viewed imaging studies, participated in medical decision making and plan of care.ROS completed by me personally and pertinent positives fully documented  I have made any additions or clarifications directly to the above note. I have spent a total of   25 minutes with the patient reviewing hospital notes,  test results, labs and examining the patient as well as establishing an assessment and plan that was discussed personally with the patient.  > 50% of time was spent in direct patient care.       Antony Contras, MD Medical Director [place] Pager: [phone] 10/23/2018 2:32 PM            To contact Stroke Continuity provider, please refer to [email or web address] After hours, contact General Neurology

## 2018-10-23 NOTE — Progress Notes (Signed)
Progress Note  Patient Name: Natalie Gardner Date of Encounter: 10/23/2018  Primary Cardiologist: Nehemiah Massed  Subjective   Non verbal large stroke groans and moves left side in bed   Inpatient Medications    Scheduled Meds: .  stroke: mapping our early stages of recovery book   Does not apply Once  . chlorhexidine  15 mL Mouth Rinse BID  . Chlorhexidine Gluconate Cloth  6 each Topical Daily  . mouth rinse  15 mL Mouth Rinse q12n4p  . metoprolol tartrate  5 mg Intravenous Q6H  . sodium chloride flush  10-40 mL Intracatheter Q12H  . sodium chloride flush  3 mL Intravenous Once   Continuous Infusions: . sodium chloride 50 mL/hr at 10/21/18 1655  . cefTRIAXone (ROCEPHIN)  IV 2 g (10/22/18 1342)  . diltiazem (CARDIZEM) infusion 15 mg/hr (10/23/18 0227)  . famotidine (PEPCID) IV 20 mg (10/22/18 2234)  . feeding supplement (JEVITY 1.2 CAL) 1,000 mL (10/22/18 1743)  . metronidazole 500 mg (10/23/18 0154)   PRN Meds: acetaminophen **OR** acetaminophen (TYLENOL) oral liquid 160 mg/5 mL **OR** acetaminophen, sodium chloride flush   Vital Signs    Vitals:   10/23/18 0009 10/23/18 0310 10/23/18 0805 10/23/18 0816  BP: 131/75 123/89  134/88  Pulse: 61 (!) 148 (!) 135 (!) 119  Resp: (!) 26 (!) 28 (!) 31 20  Temp: 99.8 F (37.7 C) 99.1 F (37.3 C)  99.8 F (37.7 C)  TempSrc: Axillary Axillary  Axillary  SpO2: 99%  98% 99%  Weight: 82.8 kg     Height:        Intake/Output Summary (Last 24 hours) at 10/23/2018 0829 Last data filed at 10/22/2018 1356 Gross per 24 hour  Intake -  Output 1050 ml  Net -1050 ml   Last 3 Weights 10/23/2018 10/22/2018 10/18/2018  Weight (lbs) 182 lb 8.7 oz 183 lb 13.8 oz 181 lb  Weight (kg) 82.8 kg 83.4 kg 82.1 kg      Telemetry    afib rates 100-120 - Personally Reviewed  ECG    afib rate 121 otherwise normal  - Personally Reviewed  Physical Exam  Pale elderly female  GEN: groans  Neck: No JVD Cardiac: RRR, no murmurs, rubs, or gallops.   Respiratory: diffuse rhonchi  GI: Soft, nontender, non-distended  MS: No edema; No deformity. Neuro:   aphasia and right hemi paresis  Psych: Normal affect  Post Left TKR  Labs    Chemistry Recent Labs  Lab 10/17/18 2212  10/19/18 0846  10/20/18 0214 10/21/18 0431 10/22/18 0345  NA 135   < > 134*   < > 138 138 137  K 4.1   < > 3.3*   < > 3.4* 3.2* 3.2*  CL 104   < > 105  --  109 109 102  CO2 21*   < > 19*  --  19* 16* 19*  GLUCOSE 123*   < > 128*  --  90 93 142*  BUN 14   < > 12  --  16 18 14   CREATININE 0.88   < > 0.69  --  0.77 0.76 0.81  CALCIUM 8.9   < > 8.1*  --  8.2* 8.0* 8.8*  PROT 6.9  --  5.7*  --   --   --   --   ALBUMIN 3.4*  --  2.8*  --   --   --   --   AST 22  --  20  --   --   --   --  ALT 19  --  15  --   --   --   --   ALKPHOS 86  --  75  --   --   --   --   BILITOT 0.5  --  0.7  --   --   --   --   GFRNONAA >60   < > >60  --  >60 >60 >60  GFRAA >60   < > >60  --  >60 >60 >60  ANIONGAP 10   < > 10  --  10 13 16*   < > = values in this interval not displayed.     Hematology Recent Labs  Lab 10/20/18 0214 10/21/18 0431 10/22/18 0345  WBC 8.0 9.3 14.2*  RBC 3.09* 3.32* 3.86*  HGB 9.1* 9.9* 11.5*  HCT 29.0* 30.9* 35.5*  MCV 93.9 93.1 92.0  MCH 29.4 29.8 29.8  MCHC 31.4 32.0 32.4  RDW 15.7* 15.4 15.4  PLT 196 212 279    Cardiac EnzymesNo results for input(s): TROPONINI in the last 168 hours. No results for input(s): TROPIPOC in the last 168 hours.   BNPNo results for input(s): BNP, PROBNP in the last 168 hours.   DDimer No results for input(s): DDIMER in the last 168 hours.   Radiology    Ct Head Wo Contrast  Result Date: 10/22/2018 CLINICAL DATA:  Follow-up intracranial hemorrhage.  Aspirin started. EXAM: CT HEAD WITHOUT CONTRAST TECHNIQUE: Contiguous axial images were obtained from the base of the skull through the vertex without intravenous contrast. COMPARISON:  Yesterday FINDINGS: Brain: Recent left MCA territory infarct with  cytotoxic edema at the insula and lateral frontal lobe. Superficial hemorrhage along the insular infarct is unchanged in extent. No evidence of progressive infarction or interval hemorrhage. No hydrocephalus or shift. Vascular: No hyperdense vessel. Skull: Negative Sinuses/Orbits: Negative IMPRESSION: Left MCA territory infarct with unchanged hemorrhagic component. Electronically Signed   By: Monte Fantasia M.D.   On: 10/22/2018 08:01   Dg Chest Port 1 View  Result Date: 10/22/2018 CLINICAL DATA:  Hypoxemia.  Labored breathing. EXAM: PORTABLE CHEST 1 VIEW COMPARISON:  Radiographs 10/21/2018 and 10/20/2018.  CT 05/27/2011. FINDINGS: 1054 hours. There is stable cardiomegaly and aortic atherosclerosis. A feeding tube has been placed, projecting below the diaphragm, tip not visualized. Pulmonary edema and a right pleural effusion have mildly worsened. There is increased opacity at the right lung base. Left basilar opacity is stable. There is no pneumothorax. The bones appear unchanged. IMPRESSION: Slight worsening of pulmonary edema with enlarging right pleural effusion and worsening right basilar aeration. Findings likely represent congestive heart failure/volume overload. Electronically Signed   By: Richardean Sale M.D.   On: 10/22/2018 11:26   Dg Chest Port 1 View  Result Date: 10/21/2018 CLINICAL DATA:  Acute CVA EXAM: PORTABLE CHEST 1 VIEW COMPARISON:  Chest x-rays dated 10/20/2018 and 10/19/2018 FINDINGS: Stable cardiomegaly. Continued central pulmonary vascular congestion and perihilar edema. Persistent bibasilar opacities, likely atelectasis and/or small pleural effusions, LEFT greater than RIGHT. IMPRESSION: 1. Persistent central pulmonary vascular congestion and perihilar edema indicating CHF/volume overload. 2. Persistent bibasilar opacities, likely atelectasis and/or small pleural effusions, LEFT greater than RIGHT. Electronically Signed   By: Franki Cabot M.D.   On: 10/21/2018 12:01    Cardiac  Studies   Echo 10/18/18 EF 55-60% no shunt   Patient Profile     73 y.o. female with left MCA stroke in setting of afib off coumadin for diarrhea and vomiting post lytic Rx with  SAH   Assessment & Plan    1. Afib:  Chronic continue iv cardizem with supplemental iv lopressor for rate control as she is NPO. Resume heparin and coumadin when ok with neurology   2. Diastolic CHF: in setting of aspiration with tachypnea and worsening CXR yesterday continue iv lasix  Patient is DNR possible palliative care consult had liter out with lasix yesterday will reorder for today       For questions or updates, please contact Jeff Please consult [email or web address] for contact info under        Signed, Jenkins Rouge, MD  10/23/2018, 8:29 AM

## 2018-10-24 LAB — GLUCOSE, CAPILLARY
Glucose-Capillary: 218 mg/dL — ABNORMAL HIGH (ref 70–99)
Glucose-Capillary: 173 mg/dL — ABNORMAL HIGH (ref 70–99)
Glucose-Capillary: 224 mg/dL — ABNORMAL HIGH (ref 70–99)
Glucose-Capillary: 213 mg/dL — ABNORMAL HIGH (ref 70–99)
Glucose-Capillary: 191 mg/dL — ABNORMAL HIGH (ref 70–99)

## 2018-10-24 MED ORDER — ASPIRIN EC 81 MG PO TBEC
81.0000 mg | DELAYED_RELEASE_TABLET | Freq: Every day | ORAL | Status: DC
  Filled 2018-10-24: qty 1

## 2018-10-24 MED ORDER — ENOXAPARIN SODIUM 40 MG/0.4ML ~~LOC~~ SOLN
40.0000 mg | SUBCUTANEOUS | Status: DC
  Administered 2018-10-24: 15:00:00 40 mg via SUBCUTANEOUS
  Filled 2018-10-24: qty 0.4

## 2018-10-24 MED ORDER — POTASSIUM CHLORIDE 10 MEQ/100ML IV SOLN
10.0000 meq | INTRAVENOUS | Status: AC
  Administered 2018-10-24 (×2): 10 meq via INTRAVENOUS
  Filled 2018-10-24 (×2): qty 100

## 2018-10-24 MED ORDER — FUROSEMIDE 10 MG/ML IJ SOLN
40.0000 mg | Freq: Two times a day (BID) | INTRAMUSCULAR | Status: DC
  Administered 2018-10-24 – 2018-10-25 (×3): 40 mg via INTRAVENOUS
  Filled 2018-10-24 (×3): qty 4

## 2018-10-24 MED ORDER — ASPIRIN 81 MG PO CHEW
81.0000 mg | CHEWABLE_TABLET | Freq: Every day | ORAL | Status: DC
  Administered 2018-10-24 – 2018-10-25 (×2): 81 mg
  Filled 2018-10-24 (×2): qty 1

## 2018-10-24 NOTE — Progress Notes (Signed)
  Speech Language Pathology Treatment: Dysphagia;Cognitive-Linquistic  Patient Details Name: Natalie Gardner MRN: [id number] DOB: [birth year] Today's Date: 10/24/2018 Time: 0850-0901 SLP Time Calculation (min) (ACUTE ONLY): 11 min  Assessment / Plan / Recommendation Clinical Impression  Pt seen to determine readiness for po intake and for aphasia treatment.  She was alert after SlP with NT raised pt up in bed and stated she wanted to try ice.  After SlP returned and cleaned pt's mouth, she closed her eyes and did not open them without total cues.  Pt's vitals much improved today - HR 119, RR 25, oxygen saturation 25 to 16.  Pt was able to follow directions to close her mouth. Anterior spillage on right of melted water.    Provided moisture via ice chip to labial musculature - to which pt admitted she enjoyed via head nod.  Pt attempted to elicit a swallow evident by movement in oral cavity but was not successful.    Of note, pt when SLP stated "You were awake just a minute ago" chuckled at this clinician.  Upon moving [place] lower and turning off lights, pt opened her eyes.  She nodded yes to wanting ice again.  Will continue efforts.   Rec NPO and frequent oral moisture via toothette, even ice to lips to provide her comfort and encourage swallowing.    Right leg appears possibly contracted. Attempted to straighten leg with resistance - ? Increased tone.  Advised RN to concerns.    HPI HPI: Patient is a 73 y.o. female with PMH: atrial fibrillation on Coumadin, CHF, chronic kidney disease, HTN, hyperlipidemia, prediabetes, wh o presented to ER with sudden onset aphasia and right facial droop. NH stroke scale was 7 on arrival. She recieved IV TPA as well as mechanical thrombectomy. MR Brain revealed moderate acute infarct of left insula and lateral frontal lobe, primarily involving cortex; SAH that is stable compared to CT previous date.      SLP Plan  Continue with current plan of care        Recommendations  Diet recommendations: NPO(oral moisture via toothette as pt desires) Medication Administration: Via alternative means Postural Changes and/or Swallow Maneuvers: Seated upright 90 degrees;Upright 30-60 min after meal                Oral Care Recommendations: Oral care QID Follow up Recommendations: Skilled Nursing facility;Inpatient Rehab;24 hour supervision/assistance SLP Visit Diagnosis: Dysphagia, oropharyngeal phase (R13.12) Plan: Continue with current plan of care       GO                Macario Golds 10/24/2018, 9:17 AM   Luanna Salk, MS [facility] SLP Acute Rehab Services Pager [phone] Office [phone]

## 2018-10-24 NOTE — Progress Notes (Signed)
Inpatient Diabetes Program Recommendations  AACE/ADA: New Consensus Statement on Inpatient Glycemic Control  Target Ranges:  Prepandial:   less than 140 mg/dL      Peak postprandial:   less than 180 mg/dL (1-2 hours)      Critically ill patients:  140 - 180 mg/dL   Results for MANVI, GUILLIAMS (MRN [id number]) as of 10/24/2018 11:13  Ref. Range 10/23/2018 08:12 10/23/2018 12:07 10/23/2018 15:51 10/23/2018 19:29 10/23/2018 23:14 10/24/2018 03:05 10/24/2018 08:34  Glucose-Capillary Latest Ref Range: 70 - 99 mg/dL 191 (H) 209 (H) 183 (H) 196 (H) 216 (H) 218 (H) 173 (H)   Review of Glycemic Control  Diabetes history: Pre-DM Outpatient Diabetes medications: None Current orders for Inpatient glycemic control: CBGs Q4H  Inpatient Diabetes Program Recommendations:   Correction (SSI): Please consider ordering Novolog 0-9 units Q4H.  Thanks, Barnie Alderman, RN, MSN, CDE Diabetes Coordinator Inpatient Diabetes Program [phone] (Team Pager from 8am to 5pm)

## 2018-10-24 NOTE — Progress Notes (Signed)
STROKE TEAM PROGRESS NOTE   SUBJECTIVE (INTERVAL HISTORY) Patient is sleepier today but can be aroused.  She follows very occasional commands.  Heart rate is still in 120s. OBJECTIVE Vitals:   10/24/18 0327 10/24/18 0500 10/24/18 0920 10/24/18 1211  BP: 119/82   99/76  Pulse: (!) 125   (!) 109  Resp: (!) 25   (!) 23  Temp: 100 F (37.8 C)   100.1 F (37.8 C)  TempSrc: Oral   Axillary  SpO2: 99%  100% 96%  Weight:  82.6 kg    Height:        CBC:  Recent Labs  Lab 10/21/18 0431 10/22/18 0345  WBC 9.3 14.2*  NEUTROABS 7.7 11.9*  HGB 9.9* 11.5*  HCT 30.9* 35.5*  MCV 93.1 92.0  PLT 212 790    Basic Metabolic Panel:  Recent Labs  Lab 10/18/18 0251  10/21/18 0431 10/22/18 0345  NA 136   < > 138 137  K 3.8   < > 3.2* 3.2*  CL 108   < > 109 102  CO2 20*   < > 16* 19*  GLUCOSE 170*   < > 93 142*  BUN 13   < > 18 14  CREATININE 0.66   < > 0.76 0.81  CALCIUM 7.6*   < > 8.0* 8.8*  MG 1.6*   < > 1.6* 2.3  PHOS 3.9  --   --   --    < > = values in this interval not displayed.    Lipid Panel:     Component Value Date/Time   CHOL 113 10/18/2018 0251   CHOL 156 07/03/2018 0936   TRIG 150 (H) 10/18/2018 0251   TRIG 148 10/18/2018 0251   TRIG 143 07/03/2018 0936   HDL 44 10/18/2018 0251   HDL 50 11/14/2017 1052   CHOLHDL 2.6 10/18/2018 0251   VLDL 30 10/18/2018 0251   VLDL 29 07/03/2018 0936   LDLCALC 39 10/18/2018 0251   LDLCALC 80 11/14/2017 1052   HgbA1c:  Lab Results  Component Value Date   HGBA1C 5.4 10/18/2018   Urine Drug Screen: No results found for: LABOPIA, COCAINSCRNUR, LABBENZ, AMPHETMU, THCU, LABBARB  Alcohol Level No results found for: ETH  IMAGING  Ct Angio Head W Or Wo Contrast Ct Angio Neck W And/or Wo Contrast  10/17/2018 IMPRESSION:  1. Occlusion of distal left M1/proximal M2 superior and inferior sylvian divisions with intermediate collateralization.  2. Patent carotid and vertebral arteries. No dissection, aneurysm, or hemodynamically  significant stenosis utilizing NASCET criteria.   Dg Chest 1 View 10/17/2018 IMPRESSION:  1. ET tube and enteric tube in good position.  2. Lower lung volumes with pulmonary vascular congestion but no overt edema.    Dg Chest 1 View 10/19/2018 IMPRESSION:   Status post extubation.  No pneumothorax.  Airspace consolidation left lower lobe, concerning for pneumonia.  Minimal pleural effusions bilaterally.  There is pulmonary vascular congestion. No interstitial edema Evident.  Dg Chest 1 View 10/21/2018 IMPRESSION:  IMPRESSION: 1. Persistent central pulmonary vascular congestion and perihilar edema indicating CHF/volume overload. 2. Persistent bibasilar opacities, likely atelectasis and/or small pleural effusions, LEFT greater than RIGHT .  Mr Brain Wo Contrast 10/18/2018 IMPRESSION:  1. Moderate acute infarct in the left insula and lateral frontal lobe, primarily involving cortex.  2. Petechial hemorrhage at the infarct. There is also subarachnoid hemorrhage/contrast that is stable or decreased in extent compared to cone beam CT from yesterday  Ct Head Code Stroke Wo  Contrast 10/17/2018 IMPRESSION:  1. Normal aging brain.  2. ASPECTS is 10.   Mr Brain Wo Contrast 10/18/2018 IMPRESSION: 1. Moderate acute infarct in the left insula and lateral frontal lobe, primarily involving cortex. 2. Petechial hemorrhage at the infarct. There is also subarachnoid hemorrhage/contrast that is stable or decreased in extent compared to cone beam CT from yesterday   CT Head Wo Contrast 10/20/2018 IMPRESSION: 1. Stable distribution of left MCA infarct involving insula and left lateral frontal lobe. Persistent small volume of high attenuation material within left Sylvian fissure, likely subarachnoid hemorrhage. 2. No new acute intracranial abnormality identified.  CT Head Wo Contrast  10/21/2018 IMPRESSION: 1. Stable size and distribution of evolving left MCA territory infarct. Associated  small volume subarachnoid hemorrhage within the adjacent left sylvian fissure also not significantly changed. 2. No other new acute intracranial abnormality.   IR - Cerebral Angiogram and Intervention S/P lt common carotid arteriogram fiollowed by revascularization of occluded Lt MCA M 1 seg with x 3 passes with 67mm x 31mm embotrap device and x 1 pass with 8mm x 40 mm solitaire x retriever achieving a TICI 2b revascularization.  Transthoracic Echocardiogram  10/18/2018 IMPRESSIONS  1. The left ventricle has normal systolic function, with an ejection fraction of 55-60%. The cavity size was normal. Left ventricular diastolic Doppler parameters are indeterminate secondary to atrial fibrillation.  2. The right ventricle has normal systolic function. The cavity was normal. There is no increase in right ventricular wall thickness. Right ventricular systolic pressure is moderately elevated with an estimated pressure of 38.0 mmHg.  3. Left atrial size was moderately dilated.  4. No ASD or PFO by color flow Doppler or saline microcavitation study.  5. Right atrial size was mildly dilated.  6. The mitral valve is abnormal. Mild thickening of the anterior mitral valve leaflet. Mild calcification of the anterior mitral valve leaflet. No evidence of mitral valve stenosis.  7. The aortic valve is tricuspid. Mild thickening of the aortic valve. Mild calcification of the aortic valve. Aortic valve regurgitation is mild by color flow Doppler. No stenosis of the aortic valve.  8. The inferior vena cava was dilated in size with <50% respiratory variability.  EKG - atrial fibrillation - ventricular response 121 BPM (See cardiology reading for complete details)   PHYSICAL EXAM  Temp:  [98.5 F (36.9 C)-100.5 F (38.1 C)] 100.1 F (37.8 C) (05/28 1211) Pulse Rate:  [109-148] 109 (05/28 1211) Resp:  [23-38] 23 (05/28 1211) BP: (99-139)/(76-95) 99/76 (05/28 1211) SpO2:  [95 %-100 %] 96 % (05/28 1211) FiO2  (%):  [40 %] 40 % (05/28 1211) Weight:  [82.5 kg-82.6 kg] 82.6 kg (05/28 0500)  .  Elderly Caucasian lady who is in mild respiratory distress.  She has rapid heart rate in 120-130 from A. fib despite being on Cardizem drip. . Afebrile. Head is nontraumatic. Neck is supple without bruit.    Cardiac exam no murmur or gallop. Lungs are clear to auscultation with prominent upper airway sounds. Distal pulses are well felt.  Neurological Exam :  -Patient is stuporous and can be barely aroused n, following occasional commands,.  Aphasic and not speaking.   left gaze preference, crosses midline, tracking examiner. Not blinking to visual threat on the right.  Right facial droop. Tongue midline in mouth.  Dense right hemiplegia with not moving right arm to pain but grimaces. Spontaneous movements on left UE and LE, moves right lower 2/5 to stim. Slightly opened eyes today. Increased tone and  upgoing on the right.   ASSESSMENT/PLAN Natalie Gardner is a 73 y.o. female with history of atrial fibrillation on Coumadin (INR 1.2), CHF, cardiomyopathy, hx of aortic valve vegetation, hx of GI bleed, chronic kidney disease, hypertension, hyperlipidemia, prediabetes and recent nausea and diarrhea (unable to hold down meds)  presenting with aphasia, right facial droop and difficulty following commands. She received IV tPA Thursday 10/17/18 at 2315. S/p IR with TICI 2b revascularization.  Stroke:  acute left MCA infarcts - embolic - atrial fibrillation off AC due to diarrhea and vomiting  MRI head - Moderate acute infarct in the left insula and lateral frontal lobe, primarily involving cortex. Petechial hemorrhage at the infarct.  CTA H&N - occlusion of distal left M1/proximal M2 superior and inferior sylvian divisions with intermediate collateralization.  Head CT 10/20/2018 - Stable distribution of left MCA infarct involving insula and left lateral frontal lobe. Persistent small volume of high attenuation material  within left Sylvian fissure, likely subarachnoid hemorrhage.  IR - left M1 occlusion with TICI2b recanalization  2D Echo - EF 55-60%. No cardiac source of emboli identified.   Sars Corona Virus 2  - negative  LDL - 39  HgbA1c - 5.4  VTE prophylaxis - SCDs  Diet - NPO  warfarin daily prior to admission, now on No antithrombotic within 24hr of tPA.  Will try to restart anticoagulation within 5 days of stroke, small subarachnoid hemorrhage and petechial hemorrhage will follow on CT of the head.  Consider DOAC.  Patient counseled to be compliant with her antithrombotic medications  Ongoing aggressive stroke risk factor management  Therapy recommendations:  CIR  Disposition:  Pending  Afib on coumadin  On coumadin but off due to recent diarrhea and vomiting  INR 1.2 on admission  Currently afib RVR  On metoprolol IV PRN  Follows with Dr. Nehemiah Massed at [facility]   May consider switch from coumadin to Olivia Lopez de Gutierrez when appropriate after discussion with pt/family Started on Cardizem drip  for RVR.  IV Lopressor for supplementation Hypertension  Blood pressure stable  BP goal 120-140 post procedure for 24 hours . Long-term BP goal normotensive  Hyperlipidemia  Lipid lowering medication PTA: Lipitor 10 mg daily  LDL 39, goal < 70  Current lipid lowering medication: none due to NPO  Continue statin at discharge  Dysphagia   Extubated 10/18/2018 Friday  Speech to follow  NPO for now  On IVF  Other Stroke Risk Factors  Advanced age  Obesity, Body mass index is 30.3 kg/m., recommend weight loss, diet and exercise as appropriate   CHF  Other Active Problems  Magnesium - 1.6 - supplement and check in AM  Acute blood loss anemia - hemoglobin 13.3-9.2-9.9-10.5->9.9 continue to monitor  CXR - 10/21/2018 pulm congestion but no pneumonia  Temp 99.3->98.4->99.7 U/A and culture no growth (WBCs - 8.0->9.3)  Hypokalemia - 3.8->3.3->3.5->3.4->3.2 supplement and  recheck in AM  NPO - dietition consulted for tube feeings  Started ASA rectally    Hospital day # 7  I spoke to both daughter again  today   and updated her, answered questions.  Patient had shown improvement in cardiopulmonary status following treatment with diuretics and antibiotics yesterday but today her condition seems to have declined slightly.  Will continue supportive care for the next few days to see if there is ongoing improvement.  Family is okay with feeding tube and transferred for rehab to skilled nursing facility but patient may need the PEG tube.  Family will have  a meeting today at home to discuss this and will let me know their decision..  But in case her condition declines further they may be leaning towards palliative care and they would prefer that in the [place] area closer to home where they live.  Appreciate help from pulmonary critical care and cardiology teams. I have personally obtained history,examined this patient, reviewed notes, independently viewed imaging studies, participated in medical decision making and plan of care.ROS completed by me personally and pertinent positives fully documented  I have made any additions or clarifications directly to the above note. I have spent a total of   25 minutes with the patient reviewing hospital notes,  test results, labs and examining the patient as well as establishing an assessment and plan that was discussed personally with the patient.  > 50% of time was spent in direct patient care.       Antony Contras, MD Medical Director [place] Pager: [phone] 10/24/2018 3:02 PM            To contact Stroke Continuity provider, please refer to [email or web address] After hours, contact General Neurology

## 2018-10-24 NOTE — Consult Note (Signed)
   [facility] CM Inpatient Consult   10/24/2018  Natalie Gardner 1946/02/19 [id number]  Patient screened for high risk score for unplanned readmission score  and/or for 3 hospitalizations in the past 6 months. Patient is in the [place] of patient's medical record reveals patient is a 73 year old female with history of Afib on coumadin, dilated cardiomyopathy, HTN, HLD, anemia/ IDA, DM, and GERD who presented with sudden onset aphasia and right facial droop  per MD notes 10/20/2018.  Patient admitted on 10/17/2018 to ICU with Left M1/proximal M@ occlusion s/p tPA and was intubated and ventilated. Patient transferred out of ICU on 10/20/2018.  Reviewed for progress with PT/OT however, patient has not been able to consistently participate.  Primary Care Provider is Brandon Melnick, MD.  This is an embedded practice with [facility] Complex Care Management.  Plan: Patient's disposition is currently unknown.  Will continue to follow as appropriate and per referral.   Please place a [facility] Care Management consult as appropriate and for questions contact:   Natividad Brood, RN BSN [facility] Liaison  [phone] business mobile phone Toll free office [phone]  Fax number: [phone] [email or web address]

## 2018-10-24 NOTE — Progress Notes (Signed)
Progress Note  Patient Name: Natalie Gardner Date of Encounter: 10/24/2018  Primary Cardiologist: Nehemiah Massed  Subjective   Non verbal large stroke groans and moves left side in bed   Inpatient Medications    Scheduled Meds: .  stroke: mapping our early stages of recovery book   Does not apply Once  . chlorhexidine  15 mL Mouth Rinse BID  . Chlorhexidine Gluconate Cloth  6 each Topical Daily  . mouth rinse  15 mL Mouth Rinse q12n4p  . metoprolol tartrate  5 mg Intravenous Q6H  . sodium chloride flush  10-40 mL Intracatheter Q12H  . sodium chloride flush  3 mL Intravenous Once   Continuous Infusions: . sodium chloride 50 mL/hr at 10/21/18 1655  . cefTRIAXone (ROCEPHIN)  IV 2 g (10/23/18 1207)  . diltiazem (CARDIZEM) infusion 15 mg/hr (10/24/18 0130)  . famotidine (PEPCID) IV 20 mg (10/23/18 2155)  . feeding supplement (JEVITY 1.2 CAL) 1,000 mL (10/23/18 1805)  . metronidazole 500 mg (10/24/18 0358)   PRN Meds: acetaminophen **OR** acetaminophen (TYLENOL) oral liquid 160 mg/5 mL **OR** acetaminophen, sodium chloride flush   Vital Signs    Vitals:   10/23/18 2327 10/24/18 0307 10/24/18 0327 10/24/18 0500  BP: 123/80  119/82   Pulse: (!) 148  (!) 125   Resp: (!) 29  (!) 25   Temp: 99.3 F (37.4 C) 100 F (37.8 C) 100 F (37.8 C)   TempSrc: Oral Axillary Oral   SpO2: 98%  99%   Weight: 82.5 kg   82.6 kg  Height:        Intake/Output Summary (Last 24 hours) at 10/24/2018 0749 Last data filed at 10/24/2018 0500 Gross per 24 hour  Intake 4617 ml  Output 250 ml  Net 4367 ml   Last 3 Weights 10/24/2018 10/23/2018 10/23/2018  Weight (lbs) 182 lb 1.6 oz 181 lb 14.1 oz 181 lb 14.1 oz  Weight (kg) 82.6 kg 82.5 kg 82.5 kg      Telemetry    afib rates 100-120 - Personally Reviewed  ECG    afib rate 121 otherwise normal  - Personally Reviewed  Physical Exam  Pale elderly female  GEN: groans  Neck: No JVD Cardiac: RRR, no murmurs, rubs, or gallops.  Respiratory:  diffuse rhonchi  GI: Soft, nontender, non-distended  MS: No edema; No deformity. Neuro:   aphasia and right hemi paresis  Psych: Normal affect  Post Left TKR  Labs    Chemistry Recent Labs  Lab 10/17/18 2212  10/19/18 0846  10/20/18 0214 10/21/18 0431 10/22/18 0345  NA 135   < > 134*   < > 138 138 137  K 4.1   < > 3.3*   < > 3.4* 3.2* 3.2*  CL 104   < > 105  --  109 109 102  CO2 21*   < > 19*  --  19* 16* 19*  GLUCOSE 123*   < > 128*  --  90 93 142*  BUN 14   < > 12  --  16 18 14   CREATININE 0.88   < > 0.69  --  0.77 0.76 0.81  CALCIUM 8.9   < > 8.1*  --  8.2* 8.0* 8.8*  PROT 6.9  --  5.7*  --   --   --   --   ALBUMIN 3.4*  --  2.8*  --   --   --   --   AST 22  --  20  --   --   --   --  ALT 19  --  15  --   --   --   --   ALKPHOS 86  --  75  --   --   --   --   BILITOT 0.5  --  0.7  --   --   --   --   GFRNONAA >60   < > >60  --  >60 >60 >60  GFRAA >60   < > >60  --  >60 >60 >60  ANIONGAP 10   < > 10  --  10 13 16*   < > = values in this interval not displayed.     Hematology Recent Labs  Lab 10/20/18 0214 10/21/18 0431 10/22/18 0345  WBC 8.0 9.3 14.2*  RBC 3.09* 3.32* 3.86*  HGB 9.1* 9.9* 11.5*  HCT 29.0* 30.9* 35.5*  MCV 93.9 93.1 92.0  MCH 29.4 29.8 29.8  MCHC 31.4 32.0 32.4  RDW 15.7* 15.4 15.4  PLT 196 212 279    Cardiac EnzymesNo results for input(s): TROPONINI in the last 168 hours. No results for input(s): TROPIPOC in the last 168 hours.   BNPNo results for input(s): BNP, PROBNP in the last 168 hours.   DDimer No results for input(s): DDIMER in the last 168 hours.   Radiology    Dg Chest Port 1 View  Result Date: 10/22/2018 CLINICAL DATA:  Hypoxemia.  Labored breathing. EXAM: PORTABLE CHEST 1 VIEW COMPARISON:  Radiographs 10/21/2018 and 10/20/2018.  CT 05/27/2011. FINDINGS: 1054 hours. There is stable cardiomegaly and aortic atherosclerosis. A feeding tube has been placed, projecting below the diaphragm, tip not visualized. Pulmonary edema and  a right pleural effusion have mildly worsened. There is increased opacity at the right lung base. Left basilar opacity is stable. There is no pneumothorax. The bones appear unchanged. IMPRESSION: Slight worsening of pulmonary edema with enlarging right pleural effusion and worsening right basilar aeration. Findings likely represent congestive heart failure/volume overload. Electronically Signed   By: Richardean Sale M.D.   On: 10/22/2018 11:26    Cardiac Studies   Echo 10/18/18 EF 55-60% no shunt   Patient Profile     73 y.o. female with left MCA stroke in setting of afib off coumadin for diarrhea and vomiting post lytic Rx with SAH   Assessment & Plan    1. Afib:  Chronic continue iv cardizem with supplemental iv lopressor for rate control as she is NPO. Resume heparin and coumadin when ok with neurology   2. Diastolic CHF: in setting of aspiration with tachypnea and worsening CXRcontinue iv lasix I/O positive but ? Accuracy bid with K supplement today   Patient is DNR possible palliative care consult        For questions or updates, please contact [place] Please consult [email or web address] for contact info under        Signed, Jenkins Rouge, MD  10/24/2018, 7:49 AM

## 2018-10-25 LAB — GLUCOSE, CAPILLARY
Glucose-Capillary: 189 mg/dL — ABNORMAL HIGH (ref 70–99)
Glucose-Capillary: 210 mg/dL — ABNORMAL HIGH (ref 70–99)
Glucose-Capillary: 232 mg/dL — ABNORMAL HIGH (ref 70–99)
Glucose-Capillary: 234 mg/dL — ABNORMAL HIGH (ref 70–99)

## 2018-10-25 MED ORDER — HALOPERIDOL LACTATE 2 MG/ML PO CONC
0.5000 mg | ORAL | Status: DC | PRN
  Filled 2018-10-25: qty 0.3

## 2018-10-25 MED ORDER — SODIUM CHLORIDE 0.9 % IV SOLN
250.0000 mL | INTRAVENOUS | Status: DC | PRN
  Administered 2018-10-25: 250 mL via INTRAVENOUS

## 2018-10-25 MED ORDER — ONDANSETRON 4 MG PO TBDP: 4.0000 mg | ORAL_TABLET | Freq: Four times a day (QID) | ORAL | Status: DC | PRN

## 2018-10-25 MED ORDER — LORAZEPAM 2 MG/ML PO CONC
1.0000 mg | ORAL | Status: DC | PRN
  Administered 2018-10-29: 1 mg via SUBLINGUAL
  Filled 2018-10-25: qty 1

## 2018-10-25 MED ORDER — GLYCOPYRROLATE 0.2 MG/ML IJ SOLN
0.2000 mg | INTRAMUSCULAR | Status: DC | PRN
  Administered 2018-10-28: 0.2 mg via INTRAVENOUS
  Filled 2018-10-25: qty 1

## 2018-10-25 MED ORDER — GLYCOPYRROLATE 0.2 MG/ML IJ SOLN
0.2000 mg | INTRAMUSCULAR | Status: DC | PRN
  Administered 2018-10-29: 0.2 mg via SUBCUTANEOUS
  Filled 2018-10-25: qty 1

## 2018-10-25 MED ORDER — SODIUM CHLORIDE 0.9% FLUSH
3.0000 mL | Freq: Two times a day (BID) | INTRAVENOUS | Status: DC
  Administered 2018-10-28: 3 mL via INTRAVENOUS

## 2018-10-25 MED ORDER — SODIUM CHLORIDE 0.9% FLUSH: 3.0000 mL | INTRAVENOUS | Status: DC | PRN

## 2018-10-25 MED ORDER — POLYVINYL ALCOHOL 1.4 % OP SOLN
1.0000 [drp] | Freq: Four times a day (QID) | OPHTHALMIC | Status: DC | PRN
  Filled 2018-10-25: qty 15

## 2018-10-25 MED ORDER — HALOPERIDOL 0.5 MG PO TABS
0.5000 mg | ORAL_TABLET | ORAL | Status: DC | PRN
  Filled 2018-10-25: qty 1

## 2018-10-25 MED ORDER — LORAZEPAM 2 MG/ML IJ SOLN
1.0000 mg | INTRAMUSCULAR | Status: DC | PRN
  Administered 2018-10-28: 1 mg via INTRAVENOUS
  Filled 2018-10-25: qty 1

## 2018-10-25 MED ORDER — ACETAMINOPHEN 650 MG RE SUPP
650.0000 mg | Freq: Four times a day (QID) | RECTAL | Status: DC | PRN
  Administered 2018-10-29: 650 mg via RECTAL
  Filled 2018-10-25: qty 1

## 2018-10-25 MED ORDER — MORPHINE 100MG IN NS 100ML (1MG/ML) PREMIX INFUSION
1.0000 mg/h | INTRAVENOUS | Status: DC
  Administered 2018-10-25: 1 mg/h via INTRAVENOUS
  Filled 2018-10-25: qty 100

## 2018-10-25 MED ORDER — GLYCOPYRROLATE 1 MG PO TABS
1.0000 mg | ORAL_TABLET | ORAL | Status: DC | PRN
  Filled 2018-10-25: qty 1

## 2018-10-25 MED ORDER — ONDANSETRON HCL 4 MG/2ML IJ SOLN: 4.0000 mg | Freq: Four times a day (QID) | INTRAMUSCULAR | Status: DC | PRN

## 2018-10-25 MED ORDER — LORAZEPAM 1 MG PO TABS: 1.0000 mg | ORAL_TABLET | ORAL | Status: DC | PRN

## 2018-10-25 MED ORDER — MORPHINE SULFATE (CONCENTRATE) 10 MG/0.5ML PO SOLN: 5.0000 mg | ORAL | Status: DC | PRN

## 2018-10-25 MED ORDER — ACETAMINOPHEN 325 MG PO TABS: 650.0000 mg | ORAL_TABLET | Freq: Four times a day (QID) | ORAL | Status: DC | PRN

## 2018-10-25 MED ORDER — HALOPERIDOL LACTATE 5 MG/ML IJ SOLN: 0.5000 mg | INTRAMUSCULAR | Status: DC | PRN

## 2018-10-25 MED ORDER — BIOTENE DRY MOUTH MT LIQD: 15.0000 mL | OROMUCOSAL | Status: DC | PRN

## 2018-10-25 NOTE — TOC Initial Note (Signed)
Transition of Care [facility]) - Initial/Assessment Note    Patient Details  Name: Natalie Gardner MRN: [id number] Date of Birth: 07/04/1945  Transition of Care [facility]) CM/SW Contact:    Geralynn Ochs, LCSW Phone Number: 10/25/2018, 5:11 PM  Clinical Narrative:  CSW alerted by MD that patient's family has decided to pursue comfort care for the patient. CSW reached out to daughter, [name], to discuss end of life care options. Family would like to pursue hospice facility, with preference for the hospice home in [facility]. CSW contacted the referral line and sent referral. Per admissions, there is currently a wait list and they will let CSW know when a bed is available. CSW to follow.                 Expected Discharge Plan: [place] Barriers to Discharge: Continued Medical Work up   Patient Goals and CMS Choice Patient states their goals for this hospitalization and ongoing recovery are:: patient's family wants her to be comfortable CMS Medicare.gov Compare Post Acute Care list provided to:: Patient Represenative (must comment) Choice offered to / list presented to : Adult Children  Expected Discharge Plan and Services Expected Discharge Plan: [place]     Post Acute Care Choice: Hospice Living arrangements for the past 2 months: Single Family Home                                      Prior Living Arrangements/Services Living arrangements for the past 2 months: Single Family Home Lives with:: Self, Spouse Patient language and need for interpreter reviewed:: No        Need for Family Participation in Patient Care: Yes (Comment) Care giver support system in place?: No (comment)   Criminal Activity/Legal Involvement Pertinent to Current Situation/Hospitalization: No - Comment as needed  Activities of Daily Living      Permission Sought/Granted Permission sought to share information with : Facility Sport and exercise psychologist, Family  Supports Permission granted to share information with : Yes, Verbal Permission Granted  Share Information with NAME: [name]  Permission granted to share info w AGENCY: Hospice  Permission granted to share info w Relationship: Daughter     Emotional Assessment   Attitude/Demeanor/Rapport: Unable to Assess Affect (typically observed): Unable to Assess Orientation: : Oriented to Self Alcohol / Substance Use: Not Applicable Psych Involvement: No (comment)  Admission diagnosis:  Stroke [facility]) [I63.9] Patient Active Problem List   Diagnosis Date Noted  . Acute respiratory failure ([place])   . Middle cerebral artery embolism, left 10/18/2018  . Oropharyngeal dysphagia   . Acute ischemic stroke ([place]) 10/17/2018  . AKI (acute kidney injury) ([place]) 06/25/2018  . Hypertension associated with diabetes ([place]) 06/14/2018  . Dyslipidemia associated with type 2 diabetes mellitus ([place]) 06/14/2018  . GERD without esophagitis 06/14/2018  . Anemia 06/14/2018  . Current use of long term anticoagulation 06/14/2018  . Closed left hip fracture ([place]) 06/08/2018  . Depression, major, single episode, moderate ([place]) 11/14/2017  . IDA (iron deficiency anemia) 07/31/2017  . Closed displaced fracture of head of left radius 06/20/2017  . Insomnia 04/10/2017  . Status post total left knee replacement 02/12/2017  . 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 Advanced care planning/counseling discussion 09/28/2016  . Essential hypertension 09/13/2015  . CHF (congestive heart failure) ([place]) 09/13/2015  . Atrial fibrillation with RVR ([place]) 09/13/2015  . Hyperlipidemia 09/13/2015  . Obesity,  diabetes, and hypertension syndrome ([place]) 09/13/2015   PCP:  Guadalupe Maple, MD Pharmacy:   [id number] [place], [place] [address] [place] [address] Phone: [phone] Fax: [place], [place], [place] [address] [place] [address] Phone: [phone]  Fax: [phone]     Social Determinants of Health (SDOH) Interventions    Readmission Risk Interventions No flowsheet data found.

## 2018-10-25 NOTE — Progress Notes (Signed)
Received call from patient's daughter, Karrie Doffing. She wanted to let the doctor know the family has decided to send the patient to hospice. I advised her I would make a note and she would need to speak to the doctor during day shift.

## 2018-10-25 NOTE — Progress Notes (Addendum)
STROKE TEAM PROGRESS NOTE   SUBJECTIVE (INTERVAL HISTORY) Poorly attends, but will briefly open eyes and intermittently follow commands. She has failed to improve overall and family now wishes to move to [place]. She will transfer to [facility] while awaiting d/c to hospice care.   OBJECTIVE Vitals:   10/25/18 0333 10/25/18 0817 10/25/18 0855 10/25/18 1021  BP:  (!) 132/101 101/67 113/72  Pulse: (!) 118  65 87  Resp: 20 20 (!) 30 (!) 22  Temp: 98.8 F (37.1 C) 97.9 F (36.6 C)    TempSrc: Axillary Axillary    SpO2: 97% 98% 100% 99%  Weight: 80.8 kg     Height:        CBC:  Recent Labs  Lab 10/21/18 0431 10/22/18 0345  WBC 9.3 14.2*  NEUTROABS 7.7 11.9*  HGB 9.9* 11.5*  HCT 30.9* 35.5*  MCV 93.1 92.0  PLT 212 784    Basic Metabolic Panel:  Recent Labs  Lab 10/21/18 0431 10/22/18 0345  NA 138 137  K 3.2* 3.2*  CL 109 102  CO2 16* 19*  GLUCOSE 93 142*  BUN 18 14  CREATININE 0.76 0.81  CALCIUM 8.0* 8.8*  MG 1.6* 2.3    Lipid Panel:     Component Value Date/Time   CHOL 113 10/18/2018 0251   CHOL 156 07/03/2018 0936   TRIG 150 (H) 10/18/2018 0251   TRIG 148 10/18/2018 0251   TRIG 143 07/03/2018 0936   HDL 44 10/18/2018 0251   HDL 50 11/14/2017 1052   CHOLHDL 2.6 10/18/2018 0251   VLDL 30 10/18/2018 0251   VLDL 29 07/03/2018 0936   LDLCALC 39 10/18/2018 0251   LDLCALC 80 11/14/2017 1052   HgbA1c:  Lab Results  Component Value Date   HGBA1C 5.4 10/18/2018   Urine Drug Screen: No results found for: LABOPIA, COCAINSCRNUR, LABBENZ, AMPHETMU, THCU, LABBARB  Alcohol Level No results found for: ETH  IMAGING  Ct Angio Head W Or Wo Contrast Ct Angio Neck W And/or Wo Contrast  10/17/2018 IMPRESSION:  1. Occlusion of distal left M1/proximal M2 superior and inferior sylvian divisions with intermediate collateralization.  2. Patent carotid and vertebral arteries. No dissection, aneurysm, or hemodynamically significant stenosis utilizing NASCET criteria.   Dg  Chest 1 View 10/17/2018 IMPRESSION:  1. ET tube and enteric tube in good position.  2. Lower lung volumes with pulmonary vascular congestion but no overt edema.    Dg Chest 1 View 10/19/2018 IMPRESSION:   Status post extubation.  No pneumothorax.  Airspace consolidation left lower lobe, concerning for pneumonia.  Minimal pleural effusions bilaterally.  There is pulmonary vascular congestion. No interstitial edema Evident.  Dg Chest 1 View 10/21/2018 IMPRESSION:  IMPRESSION: 1. Persistent central pulmonary vascular congestion and perihilar edema indicating CHF/volume overload. 2. Persistent bibasilar opacities, likely atelectasis and/or small pleural effusions, LEFT greater than RIGHT .  Mr Brain Wo Contrast 10/18/2018 IMPRESSION:  1. Moderate acute infarct in the left insula and lateral frontal lobe, primarily involving cortex.  2. Petechial hemorrhage at the infarct. There is also subarachnoid hemorrhage/contrast that is stable or decreased in extent compared to cone beam CT from yesterday  Ct Head Code Stroke Wo Contrast 10/17/2018 IMPRESSION:  1. Normal aging brain.  2. ASPECTS is 10.   Mr Brain Wo Contrast 10/18/2018 IMPRESSION: 1. Moderate acute infarct in the left insula and lateral frontal lobe, primarily involving cortex. 2. Petechial hemorrhage at the infarct. There is also subarachnoid hemorrhage/contrast that is stable or decreased in  extent compared to cone beam CT from yesterday   CT Head Wo Contrast 10/20/2018 IMPRESSION: 1. Stable distribution of left MCA infarct involving insula and left lateral frontal lobe. Persistent small volume of high attenuation material within left Sylvian fissure, likely subarachnoid hemorrhage. 2. No new acute intracranial abnormality identified.  CT Head Wo Contrast  10/21/2018 IMPRESSION: 1. Stable size and distribution of evolving left MCA territory infarct. Associated small volume subarachnoid hemorrhage within the adjacent  left sylvian fissure also not significantly changed. 2. No other new acute intracranial abnormality.   IR - Cerebral Angiogram and Intervention S/P lt common carotid arteriogram fiollowed by revascularization of occluded Lt MCA M 1 seg with x 3 passes with 89mm x 53mm embotrap device and x 1 pass with 24mm x 40 mm solitaire x retriever achieving a TICI 2b revascularization.  Transthoracic Echocardiogram  10/18/2018 IMPRESSIONS  1. The left ventricle has normal systolic function, with an ejection fraction of 55-60%. The cavity size was normal. Left ventricular diastolic Doppler parameters are indeterminate secondary to atrial fibrillation.  2. The right ventricle has normal systolic function. The cavity was normal. There is no increase in right ventricular wall thickness. Right ventricular systolic pressure is moderately elevated with an estimated pressure of 38.0 mmHg.  3. Left atrial size was moderately dilated.  4. No ASD or PFO by color flow Doppler or saline microcavitation study.  5. Right atrial size was mildly dilated.  6. The mitral valve is abnormal. Mild thickening of the anterior mitral valve leaflet. Mild calcification of the anterior mitral valve leaflet. No evidence of mitral valve stenosis.  7. The aortic valve is tricuspid. Mild thickening of the aortic valve. Mild calcification of the aortic valve. Aortic valve regurgitation is mild by color flow Doppler. No stenosis of the aortic valve.  8. The inferior vena cava was dilated in size with <50% respiratory variability.  EKG - atrial fibrillation - ventricular response 121 BPM (See cardiology reading for complete details)   PHYSICAL EXAM  Temp:  [97.9 F (36.6 C)-100 F (37.8 C)] 97.9 F (36.6 C) (05/29 0817) Pulse Rate:  [65-155] 87 (05/29 1021) Resp:  [14-30] 22 (05/29 1021) BP: (101-135)/(67-101) 113/72 (05/29 1021) SpO2:  [97 %-100 %] 99 % (05/29 1021) FiO2 (%):  [40 %] 40 % (05/29 0817) Weight:  [80.8 kg] 80.8 kg  (05/29 0333)  Elderly Caucasian lady who is in mild respiratory distress.  She has rapid heart rate in 120-150 from A. fib despite being on Cardizem drip. Afebrile, but skin is moist and sweaty. Head is nontraumatic. Neck is supple without bruit.    Cardiac exam no murmur or gallop, fast pulse. Lungs are clear to auscultation with prominent upper airway sounds. Distal pulses are well felt.  Neurological Exam :  -Patient will briefly alert with repeat stimuli. Intermittently will follow command with left arm. Aphasic and not speaking.   left gaze preference,did not cross midline today or attend to examiner. Not blinking to visual threat on the right.  Right facial droop. Tongue midline in mouth.  Dense right hemiplegia with not moving right arm to pain but grimaces. Spontaneous movements on left UE and LE, moves right lower 2/5 to stim. Slightly opened eyes today. Increased tone and upgoing on the right.   ASSESSMENT/PLAN Natalie Gardner is a 73 y.o. female with history of atrial fibrillation on Coumadin (INR 1.2), CHF, cardiomyopathy, hx of aortic valve vegetation, hx of GI bleed, chronic kidney disease, hypertension, hyperlipidemia, prediabetes and recent  nausea and diarrhea (unable to hold down meds)  presenting with aphasia, right facial droop and difficulty following commands. She received IV tPA Thursday 10/17/18 at 2315. S/p IR with TICI 2b revascularization.  Stroke:  acute left MCA infarcts - embolic - atrial fibrillation off AC due to GIB/diarrhea and vomiting  MRI head - Moderate acute infarct in the left insula and lateral frontal lobe, primarily involving cortex. Petechial hemorrhage at the infarct.  CTA H&N - occlusion of distal left M1/proximal M2 superior and inferior sylvian divisions with intermediate collateralization.  Head CT 10/20/2018 - Stable distribution of left MCA infarct involving insula and left lateral frontal lobe. Persistent small volume of high attenuation material  within left Sylvian fissure, likely subarachnoid hemorrhage.  IR - left M1 occlusion with TICI2b recanalization  2D Echo - EF 55-60%. No cardiac source of emboli identified.   Sars Corona Virus 2  - negative  LDL - 39  HgbA1c - 5.4  VTE prophylaxis - SCDs  Diet - NPO  warfarin daily prior to admission, now on No antithrombotic within 24hr of tPA.  Will try to restart anticoagulation within 5 days of stroke, small subarachnoid hemorrhage and petechial hemorrhage will follow on CT of the head.  Consider DOAC.  Patient counseled to be compliant with her antithrombotic medications  Ongoing aggressive stroke risk factor management  Therapy recommendations:  Hospice care  Disposition:  Pending  Afib on coumadin  On coumadin but off due to recent diarrhea and vomiting  INR 1.2 on admission  Currently afib RVR  On metoprolol IV PRN; Cardizem gtt was also used in effort to control rate  Follows with Dr. Nehemiah Massed at [facility]   There was d/w family about switch from coumadin to [place] if clinical course became appropriate. However now that she is CMO there will be no further tx  Hypertension  Blood pressure stable  BP goal 120-140 post procedure for 24 hours . Long-term BP goal normotensive  Hyperlipidemia  Lipid lowering medication PTA: Lipitor 10 mg daily  LDL 39, goal < 70  Current lipid lowering medication: none due to NPO  Will NOT continue statin at discharge d/t CMO  Dysphagia   Extubated 10/18/2018 Friday  Speech followed  Comfort feeds if she is alert as part of CMO  D/c IVF  Other Stroke Risk Factors  Advanced age  Obesity, Body mass index is 29.64 kg/m.,  CHF  Other Active Problems  Magnesium - 1.6 - supplemented  Acute blood loss anemia - hemoglobin 13.3-9.2-9.9-10.5->9.9   CXR - 10/21/2018 pulm congestion but no pneumonia  Temp 99.3->98.4->99.7 U/A and culture no growth (WBCs - 8.0->9.3)  Hypokalemia - 3.8->3.3->3.5->3.4->3.2  supplemented    Hospital day # 8  Family updated and have decided to transition to Comfort care at this time. Order set for End of Life care has been placed. Morphine gtt for comfort ordered. Updated Case mgt: family req palliative care and they would prefer that in the [place] area closer to home where they live.   Natalie Gardner, Natalie Gardner, Natalie Gardner Pager: [phone] I agree the patient has not made meaningful improvement over the last 48 to 72 hours she will likely survive with significant aphasia and dense right hemiplegia and dysphagia requiring permanent feeding tube and nursing home placement.  I spoke to the patient's daughter whose is clear that the patient would not have want to live like this with so much disability in the nursing home.  Family agrees to comfort care measures only.  Will transfer the patient to hospice floor and look for hospice nursing home bed in [place] area to be closer to family.  Patient is medically stable to be transferred when bed available..I have spent a total of   25 minutes with the patient reviewing hospital notes,  test results, labs and examining the patient as well as establishing an assessment and plan that was discussed personally with the patient.  > 50% of time was spent in direct patient care.    Antony Contras, MD  To contact Stroke Continuity provider, please refer to [email or web address] After hours, contact General Neurology

## 2018-10-25 NOTE — Progress Notes (Signed)
RN discussed with MD regarding pt's PIV and IV medication. IV team unable to restart another PIV at this time and pt does have a midline w/ cardizem gtt running. MD verbalized that he will speak with pt's dgt and will update RN.    Ave Filter, RN

## 2018-10-25 NOTE — Progress Notes (Signed)
Site assessment consult: L forearm PIV infiltrated, arm edematous. Discussed with Ave Filter, RN. Recommend PICC due to multiple IV meds. It is not best practice to place IV in arm with deficit after stroke.

## 2018-10-25 NOTE — Progress Notes (Signed)
SLP Cancellation Note  Patient Details Name: Natalie Gardner MRN: [id number] DOB: 12-22-1945   Cancelled treatment:       Reason Eval/Treat Not Completed: Other (comment)(per documentation in chart, she will be dc'd to hospice)   Macario Golds 10/25/2018, 10:59 AM  Luanna Salk, MS [place] Pager [phone] Office [phone]

## 2018-10-25 NOTE — Progress Notes (Signed)
Inpatient Diabetes Program Recommendations  AACE/ADA: New Consensus Statement on Inpatient Glycemic Control  Target Ranges:  Prepandial:   less than 140 mg/dL      Peak postprandial:   less than 180 mg/dL (1-2 hours)      Critically ill patients:  140 - 180 mg/dL  Results for SHALISSA, EASTERWOOD (MRN [id number]) as of 10/25/2018 12:00  Ref. Range 10/25/2018 00:21 10/25/2018 03:31 10/25/2018 08:23  Glucose-Capillary Latest Ref Range: 70 - 99 mg/dL 210 (H) 189 (H) 232 (H)   Results for KENDALYNN, WIDEMAN (MRN [id number]) as of 10/25/2018 12:00  Ref. Range 10/24/2018 03:05 10/24/2018 08:34 10/24/2018 12:34 10/24/2018 16:51 10/24/2018 20:56  Glucose-Capillary Latest Ref Range: 70 - 99 mg/dL 218 (H) 173 (H) 224 (H) 213 (H) 191 (H)   Results for SONNIE, BIAS (MRN [id number]) as of 10/24/2018 11:13  Ref. Range 10/23/2018 08:12 10/23/2018 12:07 10/23/2018 15:51 10/23/2018 19:29 10/23/2018 23:14  Glucose-Capillary Latest Ref Range: 70 - 99 mg/dL 191 (H) 209 (H) 183 (H) 196 (H) 216 (H)   Review of Glycemic Control  Diabetes history: Pre-DM Outpatient Diabetes medications: None Current orders for Inpatient glycemic control: CBGs Q4H  Inpatient Diabetes Program Recommendations:   Correction (SSI): Please consider ordering Novolog 0-9 units Q4H.  Thanks, Barnie Alderman, RN, MSN, CDE Diabetes Coordinator Inpatient Diabetes Program [phone] (Team Pager from 8am to 5pm)

## 2018-10-25 NOTE — Care Management Important Message (Signed)
Important Message  Patient Details  Name: Natalie Gardner MRN: [id number] Date of Birth: 01/03/1946   Medicare Important Message Given:  Yes    Orbie Pyo 10/25/2018, 11:43 AM

## 2018-10-26 DIAGNOSIS — I63349 Cerebral infarction due to thrombosis of unspecified cerebellar artery: Secondary | ICD-10-CM

## 2018-10-26 DIAGNOSIS — I5020 Unspecified systolic (congestive) heart failure: Secondary | ICD-10-CM

## 2018-10-26 DIAGNOSIS — E876 Hypokalemia: Secondary | ICD-10-CM

## 2018-10-26 DIAGNOSIS — Z515 Encounter for palliative care: Secondary | ICD-10-CM

## 2018-10-26 NOTE — Progress Notes (Signed)
CSW followed up with Olivia Mackie from [facility] doesn't have any beds available today. Olivia Mackie stated that there is a wait list but not sure of how many people are ahead of the patient, she stated that she will let the CSW know.   CSW will continue to follow and assist with discharge.   Domenic Schwab, MSW, [place]

## 2018-10-26 NOTE — Progress Notes (Signed)
STROKE TEAM PROGRESS NOTE   SUBJECTIVE (INTERVAL HISTORY) Patient currently on comfort care measures, on morphine drip.  However, patient open eyes on voice, move left upper and lower extremity purposefully.  Still not following commands, still aphasia.  OBJECTIVE Vitals:   10/25/18 2047 10/26/18 0223 10/26/18 0814 10/26/18 1422  BP:      Pulse: (!) 108 (!) 105    Resp: 20 (!) 22    Temp:      TempSrc:      SpO2: 99% 97% 98% 96%  Weight:      Height:        CBC:  Recent Labs  Lab 10/21/18 0431 10/22/18 0345  WBC 9.3 14.2*  NEUTROABS 7.7 11.9*  HGB 9.9* 11.5*  HCT 30.9* 35.5*  MCV 93.1 92.0  PLT 212 659    Basic Metabolic Panel:  Recent Labs  Lab 10/21/18 0431 10/22/18 0345  NA 138 137  K 3.2* 3.2*  CL 109 102  CO2 16* 19*  GLUCOSE 93 142*  BUN 18 14  CREATININE 0.76 0.81  CALCIUM 8.0* 8.8*  MG 1.6* 2.3    Lipid Panel:     Component Value Date/Time   CHOL 113 10/18/2018 0251   CHOL 156 07/03/2018 0936   TRIG 150 (H) 10/18/2018 0251   TRIG 148 10/18/2018 0251   TRIG 143 07/03/2018 0936   HDL 44 10/18/2018 0251   HDL 50 11/14/2017 1052   CHOLHDL 2.6 10/18/2018 0251   VLDL 30 10/18/2018 0251   VLDL 29 07/03/2018 0936   LDLCALC 39 10/18/2018 0251   LDLCALC 80 11/14/2017 1052   HgbA1c:  Lab Results  Component Value Date   HGBA1C 5.4 10/18/2018   Urine Drug Screen: No results found for: LABOPIA, COCAINSCRNUR, LABBENZ, AMPHETMU, THCU, LABBARB  Alcohol Level No results found for: ETH  IMAGING  Ct Angio Head W Or Wo Contrast Ct Angio Neck W And/or Wo Contrast  10/17/2018 IMPRESSION:  1. Occlusion of distal left M1/proximal M2 superior and inferior sylvian divisions with intermediate collateralization.  2. Patent carotid and vertebral arteries. No dissection, aneurysm, or hemodynamically significant stenosis utilizing NASCET criteria.   Dg Chest 1 View 10/17/2018 IMPRESSION:  1. ET tube and enteric tube in good position.  2. Lower lung volumes  with pulmonary vascular congestion but no overt edema.    Dg Chest 1 View 10/19/2018 IMPRESSION:   Status post extubation.  No pneumothorax.  Airspace consolidation left lower lobe, concerning for pneumonia.  Minimal pleural effusions bilaterally.  There is pulmonary vascular congestion. No interstitial edema Evident.  Dg Chest 1 View 10/21/2018 IMPRESSION:  IMPRESSION: 1. Persistent central pulmonary vascular congestion and perihilar edema indicating CHF/volume overload. 2. Persistent bibasilar opacities, likely atelectasis and/or small pleural effusions, LEFT greater than RIGHT .  Mr Brain Wo Contrast 10/18/2018 IMPRESSION:  1. Moderate acute infarct in the left insula and lateral frontal lobe, primarily involving cortex.  2. Petechial hemorrhage at the infarct. There is also subarachnoid hemorrhage/contrast that is stable or decreased in extent compared to cone beam CT from yesterday  Ct Head Code Stroke Wo Contrast 10/17/2018 IMPRESSION:  1. Normal aging brain.  2. ASPECTS is 10.   Mr Brain Wo Contrast 10/18/2018 IMPRESSION: 1. Moderate acute infarct in the left insula and lateral frontal lobe, primarily involving cortex. 2. Petechial hemorrhage at the infarct. There is also subarachnoid hemorrhage/contrast that is stable or decreased in extent compared to cone beam CT from yesterday   CT Head Wo Contrast 10/20/2018 IMPRESSION:  1. Stable distribution of left MCA infarct involving insula and left lateral frontal lobe. Persistent small volume of high attenuation material within left Sylvian fissure, likely subarachnoid hemorrhage. 2. No new acute intracranial abnormality identified.  CT Head Wo Contrast  10/21/2018 IMPRESSION: 1. Stable size and distribution of evolving left MCA territory infarct. Associated small volume subarachnoid hemorrhage within the adjacent left sylvian fissure also not significantly changed. 2. No other new acute intracranial abnormality.   IR  - Cerebral Angiogram and Intervention S/P lt common carotid arteriogram fiollowed by revascularization of occluded Lt MCA M 1 seg with x 3 passes with 12mm x 55mm embotrap device and x 1 pass with 4mm x 40 mm solitaire x retriever achieving a TICI 2b revascularization.  Transthoracic Echocardiogram  10/18/2018 IMPRESSIONS  1. The left ventricle has normal systolic function, with an ejection fraction of 55-60%. The cavity size was normal. Left ventricular diastolic Doppler parameters are indeterminate secondary to atrial fibrillation.  2. The right ventricle has normal systolic function. The cavity was normal. There is no increase in right ventricular wall thickness. Right ventricular systolic pressure is moderately elevated with an estimated pressure of 38.0 mmHg.  3. Left atrial size was moderately dilated.  4. No ASD or PFO by color flow Doppler or saline microcavitation study.  5. Right atrial size was mildly dilated.  6. The mitral valve is abnormal. Mild thickening of the anterior mitral valve leaflet. Mild calcification of the anterior mitral valve leaflet. No evidence of mitral valve stenosis.  7. The aortic valve is tricuspid. Mild thickening of the aortic valve. Mild calcification of the aortic valve. Aortic valve regurgitation is mild by color flow Doppler. No stenosis of the aortic valve.  8. The inferior vena cava was dilated in size with <50% respiratory variability.  EKG - atrial fibrillation - ventricular response 121 BPM (See cardiology reading for complete details)   PHYSICAL EXAM  Pulse Rate:  [105-108] 105 (05/30 0223) Resp:  [20-22] 22 (05/30 0223) SpO2:  [96 %-99 %] 96 % (05/30 1422) FiO2 (%):  [40 %] 40 % (05/30 1422)  Elderly Caucasian lady who is in mild respiratory distress.  She has rapid heart rate in 110-150 from A. fib. Afebrile. Head is nontraumatic. Neck is supple without bruit. Lungs are clear to auscultation with prominent upper airway sounds. Distal pulses are  well felt.  Neurological Exam is limited due to comfort care measures.  Patient put ice on voice, still has left gaze preference, right neglect.  Nonverbal, not following commands except open/close eyes on request. Not blinking to visual threat on the right.  Right facial droop. Tongue midline in mouth.  Spontaneous movements on left UE and LE, no spontaneous movement of right upper and lower extremity. Sensation, coordination and gait not tested.   ASSESSMENT/PLAN Ms. DELANE STALLING is a 73 y.o. female with history of atrial fibrillation on Coumadin (INR 1.2), CHF, cardiomyopathy, hx of aortic valve vegetation, hx of GI bleed, chronic kidney disease, hypertension, hyperlipidemia, prediabetes and recent nausea and diarrhea (unable to hold down meds)  presenting with aphasia, right facial droop and difficulty following commands. She received IV tPA Thursday 10/17/18 at 2315. S/p IR with TICI 2b revascularization.  Stroke:  acute left MCA infarcts - embolic - atrial fibrillation off AC due to GIB/diarrhea and vomiting  MRI head - Moderate acute infarct in the left insula and lateral frontal lobe, primarily involving cortex. Petechial hemorrhage at the infarct.  CTA H&N - occlusion of distal left  M1/proximal M2 superior and inferior sylvian divisions with intermediate collateralization.  Head CT 10/20/2018 - Stable distribution of left MCA infarct involving insula and left lateral frontal lobe. Persistent small volume of high attenuation material within left Sylvian fissure, likely subarachnoid hemorrhage.  IR - left M1 occlusion with TICI2b recanalization  2D Echo - EF 55-60%. No cardiac source of emboli identified.   Sars Corona Virus 2  - negative  LDL - 39  HgbA1c - 5.4  VTE prophylaxis - SCDs  Diet - NPO  warfarin daily prior to admission, now on No antithrombotic due to comfort care measures  Therapy recommendations:  Hospice care  Disposition:  Pending  Afib on coumadin  On  coumadin but off due to recent diarrhea and vomiting  INR 1.2 on admission  Currently afib RVR  Follows with Dr. Nehemiah Massed at [facility]   No [facility] due to comfort care measures  Hypertension  Blood pressure stable  Hyperlipidemia  Lipid lowering medication PTA: Lipitor 10 mg daily  LDL 39, goal < 70  No statin d/t CMO  Dysphagia   Extubated 10/18/2018  Comfort feeds if she is alert as part of CMO  D/c IVF  Other Stroke Risk Factors  Advanced age  Obesity, Body mass index is 29.64 kg/m.,  CHF  Other Active Problems  Magnesium - 1.6   Acute blood loss anemia - hemoglobin 13.3-9.2-9.9-10.5->9.9   CXR - 10/21/2018 pulm congestion but no pneumonia  Temp 99.3->98.4->99.7 U/A and culture no growth (WBCs - 8.0->9.3->14.2)  Hypokalemia - 3.8->3.3->3.5->3.4->3.2     Hospital day # 9  Rosalin Hawking, MD PhD Stroke Neurology 10/26/2018 8:42 PM    To contact Stroke Continuity provider, please refer to [email or web address] After hours, contact General Neurology

## 2018-10-27 NOTE — Progress Notes (Signed)
CSW followed up with Natalie Gardner from [facility]. There hasn't been any movement of the hospice waitlist and there are no beds available today.   CSW will continue to follow up to secure a hospice bed.   Domenic Schwab, MSW, [place]

## 2018-10-27 NOTE — Progress Notes (Signed)
STROKE TEAM PROGRESS NOTE   SUBJECTIVE (INTERVAL HISTORY) Patient still on comfort care measures, on morphine drip, no significant neuro change from yesterday.  Open eyes on voice, global aphasia, purposeful movement of left upper extremity.  OBJECTIVE Vitals:   10/26/18 0814 10/26/18 1422 10/26/18 2050 10/27/18 0242  BP:      Pulse:   (!) 54   Resp:   16   Temp:      TempSrc:      SpO2: 98% 96% 98% 99%  Weight:      Height:        CBC:  Recent Labs  Lab 10/21/18 0431 10/22/18 0345  WBC 9.3 14.2*  NEUTROABS 7.7 11.9*  HGB 9.9* 11.5*  HCT 30.9* 35.5*  MCV 93.1 92.0  PLT 212 865    Basic Metabolic Panel:  Recent Labs  Lab 10/21/18 0431 10/22/18 0345  NA 138 137  K 3.2* 3.2*  CL 109 102  CO2 16* 19*  GLUCOSE 93 142*  BUN 18 14  CREATININE 0.76 0.81  CALCIUM 8.0* 8.8*  MG 1.6* 2.3    Lipid Panel:     Component Value Date/Time   CHOL 113 10/18/2018 0251   CHOL 156 07/03/2018 0936   TRIG 150 (H) 10/18/2018 0251   TRIG 148 10/18/2018 0251   TRIG 143 07/03/2018 0936   HDL 44 10/18/2018 0251   HDL 50 11/14/2017 1052   CHOLHDL 2.6 10/18/2018 0251   VLDL 30 10/18/2018 0251   VLDL 29 07/03/2018 0936   LDLCALC 39 10/18/2018 0251   LDLCALC 80 11/14/2017 1052   HgbA1c:  Lab Results  Component Value Date   HGBA1C 5.4 10/18/2018   Urine Drug Screen: No results found for: LABOPIA, COCAINSCRNUR, LABBENZ, AMPHETMU, THCU, LABBARB  Alcohol Level No results found for: ETH  IMAGING  Ct Angio Head W Or Wo Contrast Ct Angio Neck W And/or Wo Contrast  10/17/2018 IMPRESSION:  1. Occlusion of distal left M1/proximal M2 superior and inferior sylvian divisions with intermediate collateralization.  2. Patent carotid and vertebral arteries. No dissection, aneurysm, or hemodynamically significant stenosis utilizing NASCET criteria.   Dg Chest 1 View 10/17/2018 IMPRESSION:  1. ET tube and enteric tube in good position.  2. Lower lung volumes with pulmonary vascular  congestion but no overt edema.    Dg Chest 1 View 10/19/2018 IMPRESSION:   Status post extubation.  No pneumothorax.  Airspace consolidation left lower lobe, concerning for pneumonia.  Minimal pleural effusions bilaterally.  There is pulmonary vascular congestion. No interstitial edema Evident.  Dg Chest 1 View 10/21/2018 IMPRESSION:  IMPRESSION: 1. Persistent central pulmonary vascular congestion and perihilar edema indicating CHF/volume overload. 2. Persistent bibasilar opacities, likely atelectasis and/or small pleural effusions, LEFT greater than RIGHT .  Mr Brain Wo Contrast 10/18/2018 IMPRESSION:  1. Moderate acute infarct in the left insula and lateral frontal lobe, primarily involving cortex.  2. Petechial hemorrhage at the infarct. There is also subarachnoid hemorrhage/contrast that is stable or decreased in extent compared to cone beam CT from yesterday  Ct Head Code Stroke Wo Contrast 10/17/2018 IMPRESSION:  1. Normal aging brain.  2. ASPECTS is 10.   Mr Brain Wo Contrast 10/18/2018 IMPRESSION: 1. Moderate acute infarct in the left insula and lateral frontal lobe, primarily involving cortex. 2. Petechial hemorrhage at the infarct. There is also subarachnoid hemorrhage/contrast that is stable or decreased in extent compared to cone beam CT from yesterday   CT Head Wo Contrast 10/20/2018 IMPRESSION: 1. Stable distribution of  left MCA infarct involving insula and left lateral frontal lobe. Persistent small volume of high attenuation material within left Sylvian fissure, likely subarachnoid hemorrhage. 2. No new acute intracranial abnormality identified.  CT Head Wo Contrast  10/21/2018 IMPRESSION: 1. Stable size and distribution of evolving left MCA territory infarct. Associated small volume subarachnoid hemorrhage within the adjacent left sylvian fissure also not significantly changed. 2. No other new acute intracranial abnormality.   IR - Cerebral Angiogram and  Intervention S/P lt common carotid arteriogram fiollowed by revascularization of occluded Lt MCA M 1 seg with x 3 passes with 38mm x 46mm embotrap device and x 1 pass with 69mm x 40 mm solitaire x retriever achieving a TICI 2b revascularization.  Transthoracic Echocardiogram  10/18/2018 IMPRESSIONS  1. The left ventricle has normal systolic function, with an ejection fraction of 55-60%. The cavity size was normal. Left ventricular diastolic Doppler parameters are indeterminate secondary to atrial fibrillation.  2. The right ventricle has normal systolic function. The cavity was normal. There is no increase in right ventricular wall thickness. Right ventricular systolic pressure is moderately elevated with an estimated pressure of 38.0 mmHg.  3. Left atrial size was moderately dilated.  4. No ASD or PFO by color flow Doppler or saline microcavitation study.  5. Right atrial size was mildly dilated.  6. The mitral valve is abnormal. Mild thickening of the anterior mitral valve leaflet. Mild calcification of the anterior mitral valve leaflet. No evidence of mitral valve stenosis.  7. The aortic valve is tricuspid. Mild thickening of the aortic valve. Mild calcification of the aortic valve. Aortic valve regurgitation is mild by color flow Doppler. No stenosis of the aortic valve.  8. The inferior vena cava was dilated in size with <50% respiratory variability.  EKG - atrial fibrillation - ventricular response 121 BPM (See cardiology reading for complete details)   PHYSICAL EXAM  Pulse Rate:  [54] 54 (05/30 2050) Resp:  [16] 16 (05/30 2050) SpO2:  [96 %-99 %] 99 % (05/31 0242) FiO2 (%):  [40 %] 40 % (05/31 0242)  Elderly Caucasian lady who is in mild respiratory distress.  Irregularly irregular heart rhythm. Afebrile. Head is nontraumatic. Distal pulses are well felt.  Neurological Exam is limited due to comfort care measures.  Patient open eyes on voice, still has left gaze preference, right  neglect.  Nonverbal, not following commands except open/close eyes on request. Not blinking to visual threat on the right.  Right facial droop. Spontaneous movements on left UE and LE with left upper extremity able to against gravity, no spontaneous movement of right upper and lower extremity. Sensation, coordination and gait not tested.   ASSESSMENT/PLAN Natalie Gardner is a 73 y.o. female with history of atrial fibrillation on Coumadin (INR 1.2), CHF, cardiomyopathy, hx of aortic valve vegetation, hx of GI bleed, chronic kidney disease, hypertension, hyperlipidemia, prediabetes and recent nausea and diarrhea (unable to hold down meds)  presenting with aphasia, right facial droop and difficulty following commands. She received IV tPA Thursday 10/17/18 at 2315. S/p IR with TICI 2b revascularization.  Stroke:  acute left MCA infarcts - embolic - atrial fibrillation off AC due to GIB/diarrhea and vomiting  MRI head - Moderate acute infarct in the left insula and lateral frontal lobe, primarily involving cortex. Petechial hemorrhage at the infarct.  CTA H&N - occlusion of distal left M1/proximal M2 superior and inferior sylvian divisions with intermediate collateralization.  Head CT 10/20/2018 - Stable distribution of left MCA infarct involving  insula and left lateral frontal lobe. Persistent small volume of high attenuation material within left Sylvian fissure, likely subarachnoid hemorrhage.  IR - left M1 occlusion with TICI2b recanalization  2D Echo - EF 55-60%. No cardiac source of emboli identified.   Sars Corona Virus 2  - negative  LDL - 39  HgbA1c - 5.4  VTE prophylaxis - SCDs  Diet - NPO  warfarin daily prior to admission, now on No antithrombotic due to comfort care measures  Therapy recommendations:  Hospice care  Disposition: Need residential hospice or home hospice placement  Afib on coumadin  On coumadin but off due to recent diarrhea and vomiting  INR 1.2 on  admission  Currently afib RVR  Follows with Dr. 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 d/t CMO  Dysphagia   Extubated 10/18/2018  Comfort feeds if she is alert as part of CMO  D/c IVF  Other Stroke Risk Factors  Advanced age  Obesity, Body mass index is 29.64 kg/m.,  CHF  Other Active Problems  Magnesium - 1.6   Acute blood loss anemia - hemoglobin 13.3-9.2-9.9-10.5->9.9   CXR - 10/21/2018 pulm congestion but no pneumonia  Temp 99.3->98.4->99.7 U/A and culture no growth (WBCs - 8.0->9.3->14.2)  Hypokalemia - 3.8->3.3->3.5->3.4->3.2   Hospital day # 10  Rosalin Hawking, MD PhD Stroke Neurology 10/27/2018 5:47 PM    To contact Stroke Continuity provider, please refer to [email or web address] After hours, contact General Neurology

## 2018-10-27 NOTE — Progress Notes (Signed)
Patient continue resting in bed quietly, able to response to voice/pain at times. Frequent turn for comfort.  Ave Filter, RN

## 2018-10-28 ENCOUNTER — Telehealth: Payer: Self-pay

## 2018-10-28 NOTE — Progress Notes (Signed)
Witnessed RN [employer] 29.2 ml of Morphine. Arby Barrette, RN

## 2018-10-28 NOTE — Progress Notes (Signed)
STROKE TEAM PROGRESS NOTE   SUBJECTIVE (INTERVAL HISTORY) Patient lying in bed, sleeping, eyes closed, not open eyes on voice.  However, as per RN, patient was awake this morning with eyes open and spontaneous moving on the left side.  Case manager is working on home hospice tomorrow.  OBJECTIVE Vitals:   10/27/18 2023 10/27/18 2127 10/28/18 0832 10/28/18 1217  BP:    (!) 111/96  Pulse: 75   (!) 145  Resp: 16   (!) 22  Temp: 99.7 F (37.6 C)   98.6 F (37 C)  TempSrc: Axillary   Oral  SpO2: 100% 90% 99% 99%  Weight:      Height:        CBC:  Recent Labs  Lab 10/22/18 0345  WBC 14.2*  NEUTROABS 11.9*  HGB 11.5*  HCT 35.5*  MCV 92.0  PLT 923    Basic Metabolic Panel:  Recent Labs  Lab 10/22/18 0345  NA 137  K 3.2*  CL 102  CO2 19*  GLUCOSE 142*  BUN 14  CREATININE 0.81  CALCIUM 8.8*  MG 2.3    Lipid Panel:     Component Value Date/Time   CHOL 113 10/18/2018 0251   CHOL 156 07/03/2018 0936   TRIG 150 (H) 10/18/2018 0251   TRIG 148 10/18/2018 0251   TRIG 143 07/03/2018 0936   HDL 44 10/18/2018 0251   HDL 50 11/14/2017 1052   CHOLHDL 2.6 10/18/2018 0251   VLDL 30 10/18/2018 0251   VLDL 29 07/03/2018 0936   LDLCALC 39 10/18/2018 0251   LDLCALC 80 11/14/2017 1052   HgbA1c:  Lab Results  Component Value Date   HGBA1C 5.4 10/18/2018    IMAGING Ct Angio Head W Or Wo Contrast Ct Angio Neck W And/or Wo Contrast  10/17/2018 IMPRESSION:  1. Occlusion of distal left M1/proximal M2 superior and inferior sylvian divisions with intermediate collateralization.  2. Patent carotid and vertebral arteries. No dissection, aneurysm, or hemodynamically significant stenosis utilizing NASCET criteria.   Dg Chest 1 View 10/17/2018 IMPRESSION:  1. ET tube and enteric tube in good position.  2. Lower lung volumes with pulmonary vascular congestion but no overt edema.   Dg Chest 1 View 10/19/2018 IMPRESSION:   Status post extubation.  No pneumothorax.  Airspace  consolidation left lower lobe, concerning for pneumonia.  Minimal pleural effusions bilaterally.  There is pulmonary vascular congestion. No interstitial edema Evident.  Dg Chest 1 View 10/21/2018 IMPRESSION:  IMPRESSION: 1. Persistent central pulmonary vascular congestion and perihilar edema indicating CHF/volume overload. 2. Persistent bibasilar opacities, likely atelectasis and/or small pleural effusions, LEFT greater than RIGHT . Mr Brain Wo Contrast 10/18/2018 IMPRESSION:  1. Moderate acute infarct in the left insula and lateral frontal lobe, primarily involving cortex.  2. Petechial hemorrhage at the infarct. There is also subarachnoid hemorrhage/contrast that is stable or decreased in extent compared to cone beam CT from yesterday  Ct Head Code Stroke Wo Contrast 10/17/2018 IMPRESSION:  1. Normal aging brain.  2. ASPECTS is 10.   Mr Brain Wo Contrast 10/18/2018 IMPRESSION: 1. Moderate acute infarct in the left insula and lateral frontal lobe, primarily involving cortex. 2. Petechial hemorrhage at the infarct. There is also subarachnoid hemorrhage/contrast that is stable or decreased in extent compared to cone beam CT from yesterday   CT Head Wo Contrast 10/20/2018 IMPRESSION: 1. Stable distribution of left MCA infarct involving insula and left lateral frontal lobe. Persistent small volume of high attenuation material within left Sylvian fissure, likely  subarachnoid hemorrhage. 2. No new acute intracranial abnormality identified.  CT Head Wo Contrast  10/21/2018 IMPRESSION: 1. Stable size and distribution of evolving left MCA territory infarct. Associated small volume subarachnoid hemorrhage within the adjacent left sylvian fissure also not significantly changed. 2. No other new acute intracranial abnormality.   IR - Cerebral Angiogram and Intervention S/P lt common carotid arteriogram fiollowed by revascularization of occluded Lt MCA M 1 seg with x 3 passes with 71mm x  37mm embotrap device and x 1 pass with 38mm x 40 mm solitaire x retriever achieving a TICI 2b revascularization.  Transthoracic Echocardiogram  10/18/2018 IMPRESSIONS  1. The left ventricle has normal systolic function, with an ejection fraction of 55-60%. The cavity size was normal. Left ventricular diastolic Doppler parameters are indeterminate secondary to atrial fibrillation.  2. The right ventricle has normal systolic function. The cavity was normal. There is no increase in right ventricular wall thickness. Right ventricular systolic pressure is moderately elevated with an estimated pressure of 38.0 mmHg.  3. Left atrial size was moderately dilated.  4. No ASD or PFO by color flow Doppler or saline microcavitation study.  5. Right atrial size was mildly dilated.  6. The mitral valve is abnormal. Mild thickening of the anterior mitral valve leaflet. Mild calcification of the anterior mitral valve leaflet. No evidence of mitral valve stenosis.  7. The aortic valve is tricuspid. Mild thickening of the aortic valve. Mild calcification of the aortic valve. Aortic valve regurgitation is mild by color flow Doppler. No stenosis of the aortic valve.  8. The inferior vena cava was dilated in size with <50% respiratory variability.  EKG - atrial fibrillation - ventricular response 121 BPM (See cardiology reading for complete details)   PHYSICAL EXAM  Temp:  [98.6 F (37 C)-99.7 F (37.6 C)] 98.6 F (37 C) (06/01 1217) Pulse Rate:  [53-145] 145 (06/01 1217) Resp:  [16-22] 22 (06/01 1217) BP: (111)/(96) 111/96 (06/01 1217) SpO2:  [90 %-100 %] 99 % (06/01 1217) FiO2 (%):  [40 %] 40 % (06/01 1217)  Elderly Caucasian lady who is in mild respiratory distress.  Irregularly irregular heart rhythm. Afebrile. Head is nontraumatic. Distal pulses are well felt.  Neurological Exam is limited due to comfort care measures.  Patient eyes closed, not open on voice, still has left gaze preference, right  neglect.  Nonverbal, not following commands. Not blinking to visual threat on the right.  Right facial droop. Spontaneous movements on left UE and LE with left upper extremity able to against gravity, no spontaneous movement of right upper and lower extremity. Sensation, coordination and gait not tested.   ASSESSMENT/PLAN Ms. TERIANNA PEGGS is a 73 y.o. female with history of atrial fibrillation on Coumadin (INR 1.2), CHF, cardiomyopathy, hx of aortic valve vegetation, hx of GI bleed, chronic kidney disease, hypertension, hyperlipidemia, prediabetes and recent nausea and diarrhea (unable to hold down meds)  presenting with aphasia, right facial droop and difficulty following commands. She received IV tPA Thursday 10/17/18 at 2315. S/p IR with TICI 2b revascularization.  Stroke:  acute left MCA infarcts s/p tPA and IR - embolic - atrial fibrillation off AC due to GIB/diarrhea and vomiting  MRI head - Moderate acute infarct in the left insula and lateral frontal lobe, primarily involving cortex. Petechial hemorrhage at the infarct.  CTA H&N - occlusion of distal left M1/proximal M2 superior and inferior sylvian divisions with intermediate collateralization.  Head CT 10/20/2018 - Stable distribution of left MCA infarct involving insula  and left lateral frontal lobe. Persistent small volume of high attenuation material within left Sylvian fissure, likely subarachnoid hemorrhage.  IR - left M1 occlusion with TICI2b recanalization  2D Echo - EF 55-60%. No cardiac source of emboli identified.   Sars Corona Virus 2  - negative  LDL - 39  HgbA1c - 5.4  Diet - NPO  warfarin daily prior to admission, now on No antithrombotic due to comfort care measures  Therapy recommendations:  Hospice care  Disposition: for home hospice placement tomorrow  DME equipment for delivery today  On low dose morphine drip. Transition off IV drip and transition to other administration routes  On 40% FM. sats  normal. Wean O2 to level that can be managed, if needed at home  Afib on coumadin  On coumadin but off due to recent diarrhea and vomiting  INR 1.2 on admission  Currently afib RVR  Follows with Dr. Nehemiah Massed at [facility]   No [facility] due to comfort care measures  Hypertension  Blood pressure stable  Hyperlipidemia  Lipid lowering medication PTA: Lipitor 10 mg daily  LDL 39  No statin d/t comfort care measures  Dysphagia   Extubated 10/18/2018  Comfort feeds if she is alert as part of comfort care measures  Other Stroke Risk Factors  Advanced age  Obesity, Body mass index is 29.64 kg/m.,  CHF  Other Active Problems  Acute blood loss anemia  Hypokalemia   Hospital day # 11  Rosalin Hawking, MD PhD Stroke Neurology 10/28/2018 1:36 PM  To contact Stroke Continuity provider, please refer to [email or web address] After hours, contact General Neurology

## 2018-10-28 NOTE — Telephone Encounter (Signed)
Verbal given 

## 2018-10-28 NOTE — Telephone Encounter (Signed)
Melissa from Hospice called, the patient has had a massive stroke and the family would like to bring Hospice in. Verbal from [place] that it is ok to go ahead with the verbal order.  Will wait for the RN to call to give the ok.

## 2018-10-28 NOTE — Progress Notes (Signed)
Nutrition Brief Note  Chart reviewed. Pt currently on comfort measure with plans for home with hospice. No further nutrition interventions warranted at this time.  Please consult as needed.   Corrin Parker, MS, RD, LDN Pager # [phone] After hours/ weekend pager # [phone]

## 2018-10-28 NOTE — TOC Progression Note (Signed)
Transition of Care [facility] - Progression Note    Patient Details  Name: Natalie Gardner MRN: [id number] Date of Birth: Nov 25, 1945  Transition of Care [facility]) CM/SW Contact  Pollie Friar, RN Phone Number: 10/28/2018, 2:46 PM  Clinical Narrative:    Pt's daughter: [name] is asking to take her mother home with hospice. She had reached out yesterday to [facility].  CM called Melissa with [facility] and they have the referral. They inquired about needed DME. Pt will need oxygen and a bed for home. Melissa to order the DME. She states the DME will be ready in am. NP updated.  Plan is for [facility]AR home tomorrow once DME in the home.  Expected Discharge Plan: [place] Barriers to Discharge: Equipment Delay  Expected Discharge Plan and Services Expected Discharge Plan: [place] Choice: Hospice Living arrangements for the past 2 months: Single Family Home                                       Social Determinants of Health (SDOH) Interventions    Readmission Risk Interventions No flowsheet data found.

## 2018-10-28 NOTE — Discharge Summary (Addendum)
Stroke Discharge Summary  Patient ID: Natalie Gardner   MRN: [id number]      DOB: 07-25-1945  Date of Admission: 10/17/2018 Date of Discharge: 10/29/2018  Attending Physician:  Rosalin Hawking, MD, Stroke MD Consultant(s):    cardiology and pulmonary/intensive care  Patient's PCP:  Guadalupe Maple, MD  DISCHARGE DIAGNOSIS:  Principal Problem:   Acute ischemic stroke [facility]) Active Problems:   Atrial fibrillation with RVR ([place])   Hyperlipidemia   Obesity, diabetes, and hypertension syndrome ([place])   Hypertension associated with diabetes ([place])   Middle cerebral artery embolism, left   Oropharyngeal dysphagia   Acute respiratory failure ([place])   Acute blood loss anemia   Past Medical History:  Diagnosis Date  . Anemia   . Aortic valve regurgitation   . Arthritis   . Atrial fibrillation ([place])   . Cancer ([place]) 12/2016   Basal Cell Skin Cancer  . CHF (congestive heart failure) ([place])   . CKD (chronic kidney disease)   . Colitis   . Diverticulosis   . GERD (gastroesophageal reflux disease)   . GI bleed   . History of hiatal hernia   . Hypercholesterolemia   . Hyperlipidemia   . Hypertension   . Insomnia   . Osteopenia   . Pre-diabetes   . Urinary, incontinence, stress female   . Vertigo    Past Surgical History:  Procedure Laterality Date  . ANKLE SURGERY Right 1997   Fractured ankle  . COLONOSCOPY    . COLONOSCOPY WITH PROPOFOL N/A 02/08/2015   Procedure: COLONOSCOPY WITH PROPOFOL;  Surgeon: Manya Silvas, MD;  Location: [facility] ENDOSCOPY;  Service: Endoscopy;  Laterality: N/A;  . COLONOSCOPY WITH PROPOFOL N/A 08/20/2017   Procedure: COLONOSCOPY WITH PROPOFOL;  Surgeon: Manya Silvas, MD;  Location: [facility] ENDOSCOPY;  Service: Endoscopy;  Laterality: N/A;  . ESOPHAGOGASTRODUODENOSCOPY (EGD) WITH PROPOFOL N/A 08/20/2017   Procedure: ESOPHAGOGASTRODUODENOSCOPY (EGD) WITH PROPOFOL;  Surgeon: Manya Silvas, MD;  Location: [facility] ENDOSCOPY;  Service: Endoscopy;  Laterality:  N/A;  . INTRAMEDULLARY (IM) NAIL INTERTROCHANTERIC Left 06/09/2018   Procedure: INTRAMEDULLARY (IM) NAIL INTERTROCHANTRIC;  Surgeon: Thornton Park, MD;  Location: [facility] ORS;  Service: Orthopedics;  Laterality: Left;  . IR CT HEAD LTD  10/18/2018  . IR PERCUTANEOUS ART THROMBECTOMY/INFUSION INTRACRANIAL INC DIAG ANGIO  10/18/2018  . KNEE ARTHROPLASTY Left 02/12/2017   Procedure: COMPUTER ASSISTED TOTAL KNEE ARTHROPLASTY;  Surgeon: Dereck Leep, MD;  Location: [facility] ORS;  Service: Orthopedics;  Laterality: Left;  . RADIAL HEAD ARTHROPLASTY Left 06/21/2017   Procedure: RADIAL HEAD ARTHROPLASTY;  Surgeon: Corky Mull, MD;  Location: [facility] ORS;  Service: Orthopedics;  Laterality: Left;  . RADIOLOGY WITH ANESTHESIA N/A 10/17/2018   Procedure: IR WITH ANESTHESIA;  Surgeon: Luanne Bras, MD;  Location: [place];  Service: Radiology;  Laterality: N/A;  . SACROCOCCYGEAL ULCER REMOVAL    . TONSILLECTOMY    . TOTAL KNEE ARTHROPLASTY Left     Allergies as of 10/29/2018      Reactions   Penicillins    Yeast infections Has patient had a PCN reaction causing immediate rash, facial/tongue/throat swelling, SOB or lightheadedness with hypotension: No Has patient had a PCN reaction causing severe rash involving mucus membranes or skin necrosis: No Has patient had a PCN reaction that required hospitalization: No Has patient had a PCN reaction occurring within the last 10 years: No If all of the above answers are "NO", then may proceed with Cephalosporin use.   Penicillin  V Potassium Other (See Comments)   Yeast infection   Protonix [pantoprazole Sodium] Rash   Sulfa Antibiotics    Yeast infections  Other reaction(s): Other (See Comments) Yeast infection      Medication List    STOP taking these medications   acetaminophen 650 MG CR tablet Commonly known as:  TYLENOL Replaced by:  acetaminophen 650 MG suppository   atorvastatin 10 MG tablet Commonly known as:  LIPITOR   diltiazem 240 MG 24 hr  capsule Commonly known as:  DILACOR XR   docusate sodium 100 MG capsule Commonly known as:  COLACE   ferrous sulfate 325 (65 FE) MG tablet   furosemide 20 MG tablet Commonly known as:  LASIX   lisinopril 10 MG tablet Commonly known as:  ZESTRIL   Multi-Vitamins Tabs   ranitidine 150 MG tablet Commonly known as:  ZANTAC   sucralfate 1 g tablet Commonly known as:  CARAFATE   traZODone 50 MG tablet Commonly known as:  DESYREL   venlafaxine XR 75 MG 24 hr capsule Commonly known as:  Effexor XR   warfarin 1 MG tablet Commonly known as:  COUMADIN   warfarin 5 MG tablet Commonly known as:  COUMADIN   warfarin 6 MG tablet Commonly known as:  COUMADIN     TAKE these medications   acetaminophen 650 MG suppository Commonly known as:  TYLENOL Place 1 suppository (650 mg total) rectally every 6 (six) hours as needed for mild pain (or Fever >/= 101). Replaces:  acetaminophen 650 MG CR tablet   glycopyrrolate 1 MG tablet Commonly known as:  ROBINUL Take 1 tablet (1 mg total) by mouth every 4 (four) hours as needed (excessive secretions).   haloperidol 2 MG/ML solution Commonly known as:  HALDOL Place 0.3 mLs (0.6 mg total) under the tongue every 4 (four) hours as needed for agitation (or delirium).   LORazepam 2 MG/ML concentrated solution Commonly known as:  ATIVAN Place 0.5 mLs (1 mg total) under the tongue every 4 (four) hours as needed for anxiety.   morphine CONCENTRATE 10 MG/0.5ML Soln concentrated solution Place 0.25 mLs (5 mg total) under the tongue every 2 (two) hours as needed for moderate pain (or dyspnea).   ondansetron 4 MG disintegrating tablet Commonly known as:  ZOFRAN-ODT Take 1 tablet (4 mg total) by mouth every 6 (six) hours as needed for nausea.   polyvinyl alcohol 1.4 % ophthalmic solution Commonly known as:  LIQUIFILM TEARS Place 1 drop into both eyes 4 (four) times daily as needed for dry eyes.       LABORATORY STUDIES CBC    Component  Value Date/Time   WBC 14.2 (H) 10/22/2018 0345   RBC 3.86 (L) 10/22/2018 0345   HGB 11.5 (L) 10/22/2018 0345   HGB 11.5 11/14/2017 1052   HCT 35.5 (L) 10/22/2018 0345   HCT 34.8 11/14/2017 1052   PLT 279 10/22/2018 0345   PLT 254 11/14/2017 1052   MCV 92.0 10/22/2018 0345   MCV 95 11/14/2017 1052   MCV 69 (L) 04/22/2012 0942   MCH 29.8 10/22/2018 0345   MCHC 32.4 10/22/2018 0345   RDW 15.4 10/22/2018 0345   RDW 14.5 11/14/2017 1052   RDW 17.4 (H) 04/22/2012 0942   LYMPHSABS 1.1 10/22/2018 0345   LYMPHSABS 1.3 11/14/2017 1052   LYMPHSABS 1.3 04/22/2012 0942   MONOABS 1.2 (H) 10/22/2018 0345   MONOABS 0.3 04/22/2012 0942   EOSABS 0.0 10/22/2018 0345   EOSABS 0.2 11/14/2017 1052   EOSABS  0.2 04/22/2012 0942   BASOSABS 0.0 10/22/2018 0345   BASOSABS 0.0 11/14/2017 1052   BASOSABS 0.1 04/22/2012 0942   CMP    Component Value Date/Time   NA 137 10/22/2018 0345   NA 133 (L) 07/03/2018 1137   K 3.2 (L) 10/22/2018 0345   CL 102 10/22/2018 0345   CO2 19 (L) 10/22/2018 0345   GLUCOSE 142 (H) 10/22/2018 0345   BUN 14 10/22/2018 0345   BUN 17 07/03/2018 1137   CREATININE 0.81 10/22/2018 0345   CALCIUM 8.8 (L) 10/22/2018 0345   PROT 5.7 (L) 10/19/2018 0846   PROT 6.5 11/14/2017 1052   ALBUMIN 2.8 (L) 10/19/2018 0846   ALBUMIN 3.5 11/14/2017 1052   AST 20 10/19/2018 0846   AST 27 07/03/2018 0936   ALT 15 10/19/2018 0846   ALT 24 07/03/2018 0936   ALKPHOS 75 10/19/2018 0846   BILITOT 0.7 10/19/2018 0846   BILITOT 0.3 11/14/2017 1052   GFRNONAA >60 10/22/2018 0345   GFRAA >60 10/22/2018 0345   COAGS Lab Results  Component Value Date   INR 1.2 10/17/2018   INR 2.55 06/26/2018   INR 2.58 06/25/2018   Lipid Panel    Component Value Date/Time   CHOL 113 10/18/2018 0251   CHOL 156 07/03/2018 0936   TRIG 150 (H) 10/18/2018 0251   TRIG 148 10/18/2018 0251   TRIG 143 07/03/2018 0936   HDL 44 10/18/2018 0251   HDL 50 11/14/2017 1052   CHOLHDL 2.6 10/18/2018 0251   VLDL  30 10/18/2018 0251   VLDL 29 07/03/2018 0936   LDLCALC 39 10/18/2018 0251   LDLCALC 80 11/14/2017 1052   HgbA1C  Lab Results  Component Value Date   HGBA1C 5.4 10/18/2018   Urinalysis    Component Value Date/Time   COLORURINE YELLOW 10/19/2018 0846   APPEARANCEUR CLEAR 10/19/2018 0846   APPEARANCEUR Cloudy (A) 11/14/2017 0956   LABSPEC 1.023 10/19/2018 0846   PHURINE 5.0 10/19/2018 0846   GLUCOSEU NEGATIVE 10/19/2018 0846   HGBUR NEGATIVE 10/19/2018 0846   BILIRUBINUR NEGATIVE 10/19/2018 0846   BILIRUBINUR Negative 11/14/2017 0956   KETONESUR 5 (A) 10/19/2018 0846   PROTEINUR NEGATIVE 10/19/2018 0846   NITRITE NEGATIVE 10/19/2018 0846   LEUKOCYTESUR NEGATIVE 10/19/2018 0846   Urine Drug Screen No results found for: LABOPIA, COCAINSCRNUR, LABBENZ, AMPHETMU, THCU, LABBARB  Alcohol Level No results found for: [facility]   SIGNIFICANT DIAGNOSTIC STUDIES Ct Head Code Stroke Wo Contrast 10/17/2018 IMPRESSION:  1. Normal aging brain.  2. ASPECTS is 10.   Ct Angio Head W Or Wo Contrast Ct Angio Neck W And/or Wo Contrast  10/17/2018 IMPRESSION:  1. Occlusion of distal left M1/proximal M2 superior and inferior sylvian divisions with intermediate collateralization.  2. Patent carotid and vertebral arteries. No dissection, aneurysm, or hemodynamically significant stenosis utilizing NASCET criteria.   IR - Cerebral Angiogram and Intervention S/P lt common carotid arteriogram fiollowed by revascularization of occluded Lt MCA M 1 seg with x 3 passes with 30mm x 37mm embotrap device and x 1 pass with 83mm x 40 mm solitaire x retriever achieving a TICI 2b revascularization.  Mr Brain Wo Contrast 10/18/2018 IMPRESSION:  1. Moderate acute infarct in the left insula and lateral frontal lobe, primarily involving cortex.  2. Petechial hemorrhage at the infarct. There is also subarachnoid hemorrhage/contrast that is stable or decreased in extent compared to cone beam CT from yesterday  CT Head  Wo Contrast 10/20/2018 IMPRESSION: 1. Stable distribution of left MCA infarct involving  insula and left lateral frontal lobe. Persistent small volume of high attenuation material within left Sylvian fissure, likely subarachnoid hemorrhage. 2. No new acute intracranial abnormality identified.  CT Head Wo Contrast  10/21/2018 IMPRESSION: 1. Stable size and distribution of evolving left MCA territory infarct. Associated small volume subarachnoid hemorrhage within the adjacent left sylvian fissure also not significantly changed. 2. No other new acute intracranial abnormality.  Dg Chest 1 View 10/17/2018 IMPRESSION:  1. ET tube and enteric tube in good position.  2. Lower lung volumes with pulmonary vascular congestion but no overt edema.   Dg Chest 1 View 10/19/2018 IMPRESSION:   Status post extubation. No pneumothorax.  Airspace consolidation left lower lobe, concerning for pneumonia.  Minimal pleural effusions bilaterally.  There is pulmonary vascular congestion. No interstitial edema Evident.  Dg Chest 1 View 10/21/2018 IMPRESSION:  IMPRESSION: 1. Persistent central pulmonary vascular congestion and perihilar edema indicating CHF/volume overload. 2. Persistent bibasilar opacities, likely atelectasis and/or small pleural effusions, LEFT greater than RIGHT  Transthoracic Echocardiogram  10/18/2018 IMPRESSIONS 1. The left ventricle has normal systolic function, with an ejection fraction of 55-60%. The cavity size was normal. Left ventricular diastolic Doppler parameters are indeterminate secondary to atrial fibrillation. 2. The right ventricle has normal systolic function. The cavity was normal. There is no increase in right ventricular wall thickness. Right ventricular systolic pressure is moderately elevated with an estimated pressure of 38.0 mmHg. 3. Left atrial size was moderately dilated. 4. No ASD or PFO by color flow Doppler or saline microcavitation study. 5.  Right atrial size was mildly dilated. 6. The mitral valve is abnormal. Mild thickening of the anterior mitral valve leaflet. Mild calcification of the anterior mitral valve leaflet. No evidence of mitral valve stenosis. 7. The aortic valve is tricuspid. Mild thickening of the aortic valve. Mild calcification of the aortic valve. Aortic valve regurgitation is mild by color flow Doppler. No stenosis of the aortic valve. 8. The inferior vena cava was dilated in size with <50% respiratory variability.  EKG - atrial fibrillation - ventricular response 121 BPM (See cardiology reading for complete details)    HISTORY OF PRESENT ILLNESS Natalie Gardner is a 73 y.o. female with past medical history of atrial fibrillation on Coumadin, CHF, chronic kidney disease hypertension, hyperlipidemia, prediabetes presents to the emergency room with sudden onset aphasia and right facial droop noted by family at 37 PM today 10/17/2018.  On arrival patient has difficulty saying sentences, but able to follow commands.  NIH stroke scale 7 on arrival.  Patient in A. fib with heart rate of 130s.  Stat CT head was obtained which showed no acute hemorrhage.  Aspects was 10.  A stat CT angiogram was performed showed a distal left M1/proximal M2 occlusion. Baseline MRS 2  Patient has been having difficulty with diarrhea and vomiting and has not been able to hold down medications the last 3 days. tPA was administered after INR returned as 1.2.  Discussed with daughter and husband over the phone regarding risk versus benefit of IV TPA as well as performing mechanical thrombectomy and family consented and patient was taken to IR.   HOSPITAL COURSE Natalie Gardner is a 73 y.o. female with history of atrial fibrillation on Coumadin (INR 1.2), CHF, cardiomyopathy, hx of aortic valve vegetation, hx of GI bleed, chronic kidney disease, hypertension, hyperlipidemia, prediabetes and recent nausea and diarrhea (unable to hold down  meds)  presenting with aphasia, right facial droop and difficulty following commands. She received  IV tPA Thursday 10/17/18 at 2315. S/p IR with TICI 2b revascularization.  Stroke:  acute left MCA infarcts s/p tPA and IR - embolic - atrial fibrillation off AC due to GIB/diarrhea and vomiting  MRI head - Moderate acute infarct in the left insula and lateral frontal lobe, primarily involving cortex. Petechial hemorrhage at the infarct.  CTA H&N - occlusion of distal left M1/proximal M2 superior and inferior sylvian divisions with intermediate collateralization.  Head CT 10/20/2018 - Stable distribution of left MCA infarct involving insula and left lateral frontal lobe. Persistent small volume of high attenuation material within left Sylvian fissure, likely subarachnoid hemorrhage.  IR - left M1 occlusion with TICI2b recanalization  2D Echo - EF 55-60%. No cardiac source of emboli identified.   Sars Corona Virus 2  - negative  LDL - 39  HgbA1c - 5.4  Diet - NPO  warfarin daily prior to admission, now on No antithrombotic due to comfort care measures  Therapy recommendations:  Hospice care  Disposition: home with hospice   Afib on coumadin  On coumadin but off due to recent diarrhea and vomiting  INR 1.2 on admission  Currently afib RVR  Follows with [facility] Nehemiah Massed at [facility]   No [facility] due to comfort care measures  Hypertension  Blood pressure stable  Hyperlipidemia  Lipid lowering medication PTA: Lipitor 10 mg daily  LDL 39  No statin d/t comfort care measures  Dysphagia   Extubated 10/18/2018  Comfort feeds if she is alert as part of comfort care measures  Other Stroke Risk Factors  Advanced age  Obesity, Body mass index is 29.64 kg/m.,  CHF  Other Active Problems  Acute blood loss anemia  Hypokalemia   Malnutrition - NPO, inability to eat   DISCHARGE EXAM Blood pressure (!) 140/97, pulse (!) 50, temperature 97.8 F (36.6 C),  temperature source Oral, resp. rate (!) 24, height 5\' 5"  (1.651 m), weight 80.8 kg, SpO2 100 %. Elderly Caucasian lady who is in mild respiratory distress.  Irregularly irregular heart rhythm. Afebrile. Head is nontraumatic. Distal pulses are well felt.  Neurological Exam is limited due to comfort care measures.  Patient eyes closed, not open on voice, still has left gaze preference, right neglect.  Nonverbal, not following commands. Not blinking to visual threat on the right.  Right facial droop. Spontaneous movements on left UE and LE with left upper extremity able to against gravity, no spontaneous movement of right upper and lower extremity. Sensation, coordination and gait not tested.  Discharge Diet   Comfort feeds  DISCHARGE PLAN  Disposition:  Home    Comfort care  Follow-up with Hospice   35 minutes were spent preparing discharge.  Rosalin Hawking, MD PhD Stroke Neurology 10/29/2018 8:34 PM

## 2018-10-28 NOTE — Progress Notes (Signed)
Pt Morphine drip stopped as ordered; oxygen wean down to 15liters hi-flow as ordered. Pt resting; will continue to closely monitor. Delia Heady RN

## 2018-10-29 DIAGNOSIS — E7849 Other hyperlipidemia: Secondary | ICD-10-CM

## 2018-10-29 DIAGNOSIS — E1159 Type 2 diabetes mellitus with other circulatory complications: Secondary | ICD-10-CM

## 2018-10-29 DIAGNOSIS — D62 Acute posthemorrhagic anemia: Secondary | ICD-10-CM

## 2018-10-29 MED ORDER — HALOPERIDOL LACTATE 2 MG/ML PO CONC: 0.6000 mg | ORAL | 0 refills | Status: AC | PRN

## 2018-10-29 MED ORDER — GLYCOPYRROLATE 1 MG PO TABS: 1.0000 mg | ORAL_TABLET | ORAL | 0 refills | Status: AC | PRN

## 2018-10-29 MED ORDER — ONDANSETRON 4 MG PO TBDP: 4.0000 mg | ORAL_TABLET | Freq: Four times a day (QID) | ORAL | 0 refills | Status: AC | PRN

## 2018-10-29 MED ORDER — ACETAMINOPHEN 650 MG RE SUPP: 650.0000 mg | Freq: Four times a day (QID) | RECTAL | 0 refills | Status: AC | PRN

## 2018-10-29 MED ORDER — MORPHINE SULFATE (CONCENTRATE) 10 MG/0.5ML PO SOLN: 5.0000 mg | ORAL | 0 refills | Status: AC | PRN

## 2018-10-29 MED ORDER — LORAZEPAM 2 MG/ML PO CONC: 1.0000 mg | ORAL | 0 refills | Status: AC | PRN

## 2018-10-29 MED ORDER — POLYVINYL ALCOHOL 1.4 % OP SOLN: 1.0000 [drp] | Freq: Four times a day (QID) | OPHTHALMIC | 0 refills | Status: AC | PRN

## 2018-10-29 NOTE — Progress Notes (Signed)
Patient mid-line left arm removed

## 2018-10-29 NOTE — Plan of Care (Signed)
Patient will be discharged to home with hospice

## 2018-10-29 NOTE — Consult Note (Signed)
   [facility] CM Inpatient Consult   10/29/2018  Natalie Gardner 07-Jun-1945 [id number]    Follow-up note:   Patient's disposition followed for possible discharge needs with [facility] care management services but patient's family has decided to pursue comfort care and patient's daughter requesting to take her mother home with hospice.  Review of transition of care team note, show that patient's current disposition ishome with [place], [facility].   There are no [facility] follow-up needs at this point, patient will have full care under Hospice.  Will notify embedded practice through embedded CM of patient's disposition and Inpatient transition of care CM aware of it.   For questions and additional information, please contact:  Breaunna Gottlieb A. Paiton Boultinghouse, BSN, RN-BC [facility] Liaison Cell: [phone]

## 2018-10-29 NOTE — TOC Transition Note (Signed)
Transition of Care [facility]) - CM/SW Discharge Note   Patient Details  Name: Natalie Gardner MRN: [id number] Date of Birth: [birth year]  Transition of Care [facility]) CM/SW Contact:  Pollie Friar, RN Phone Number: 10/29/2018, 10:16 AM   Clinical Narrative:    Pt discharging home with [facility]. CM verified address and DME delivery with daughter.  CM called [facility] and arranged transport. Melissa with Hospice aware of [facility] being called. Transport packet at he desk and bedside RN aware.   Final next level of care: Home w Hospice Care Barriers to Discharge: Barriers Resolved   Patient Goals and CMS Choice Patient states their goals for this hospitalization and ongoing recovery are:: patient's family wants her to be comfortable CMS Medicare.gov Compare Post Acute Care list provided to:: Patient Represenative (must comment) Choice offered to / list presented to : Adult Children  Discharge Placement                       Discharge Plan and Services     Post Acute Care Choice: Hospice                      HH Agency: ([place] and hospice)        Social Determinants of Health (SDOH) Interventions     Readmission Risk Interventions No flowsheet data found.

## 2018-11-27 DEATH — deceased

## 2018-12-25 ENCOUNTER — Ambulatory Visit: Payer: Self-pay

## 2019-06-27 IMAGING — DX DG KNEE 1-2V PORT*L*
2 series · 2 of 2 positions shown · non-contrast
Comparison: None.

CLINICAL DATA: Total knee replacement post op

EXAM:
PORTABLE LEFT KNEE - 1-2 VIEW

[knee ap]
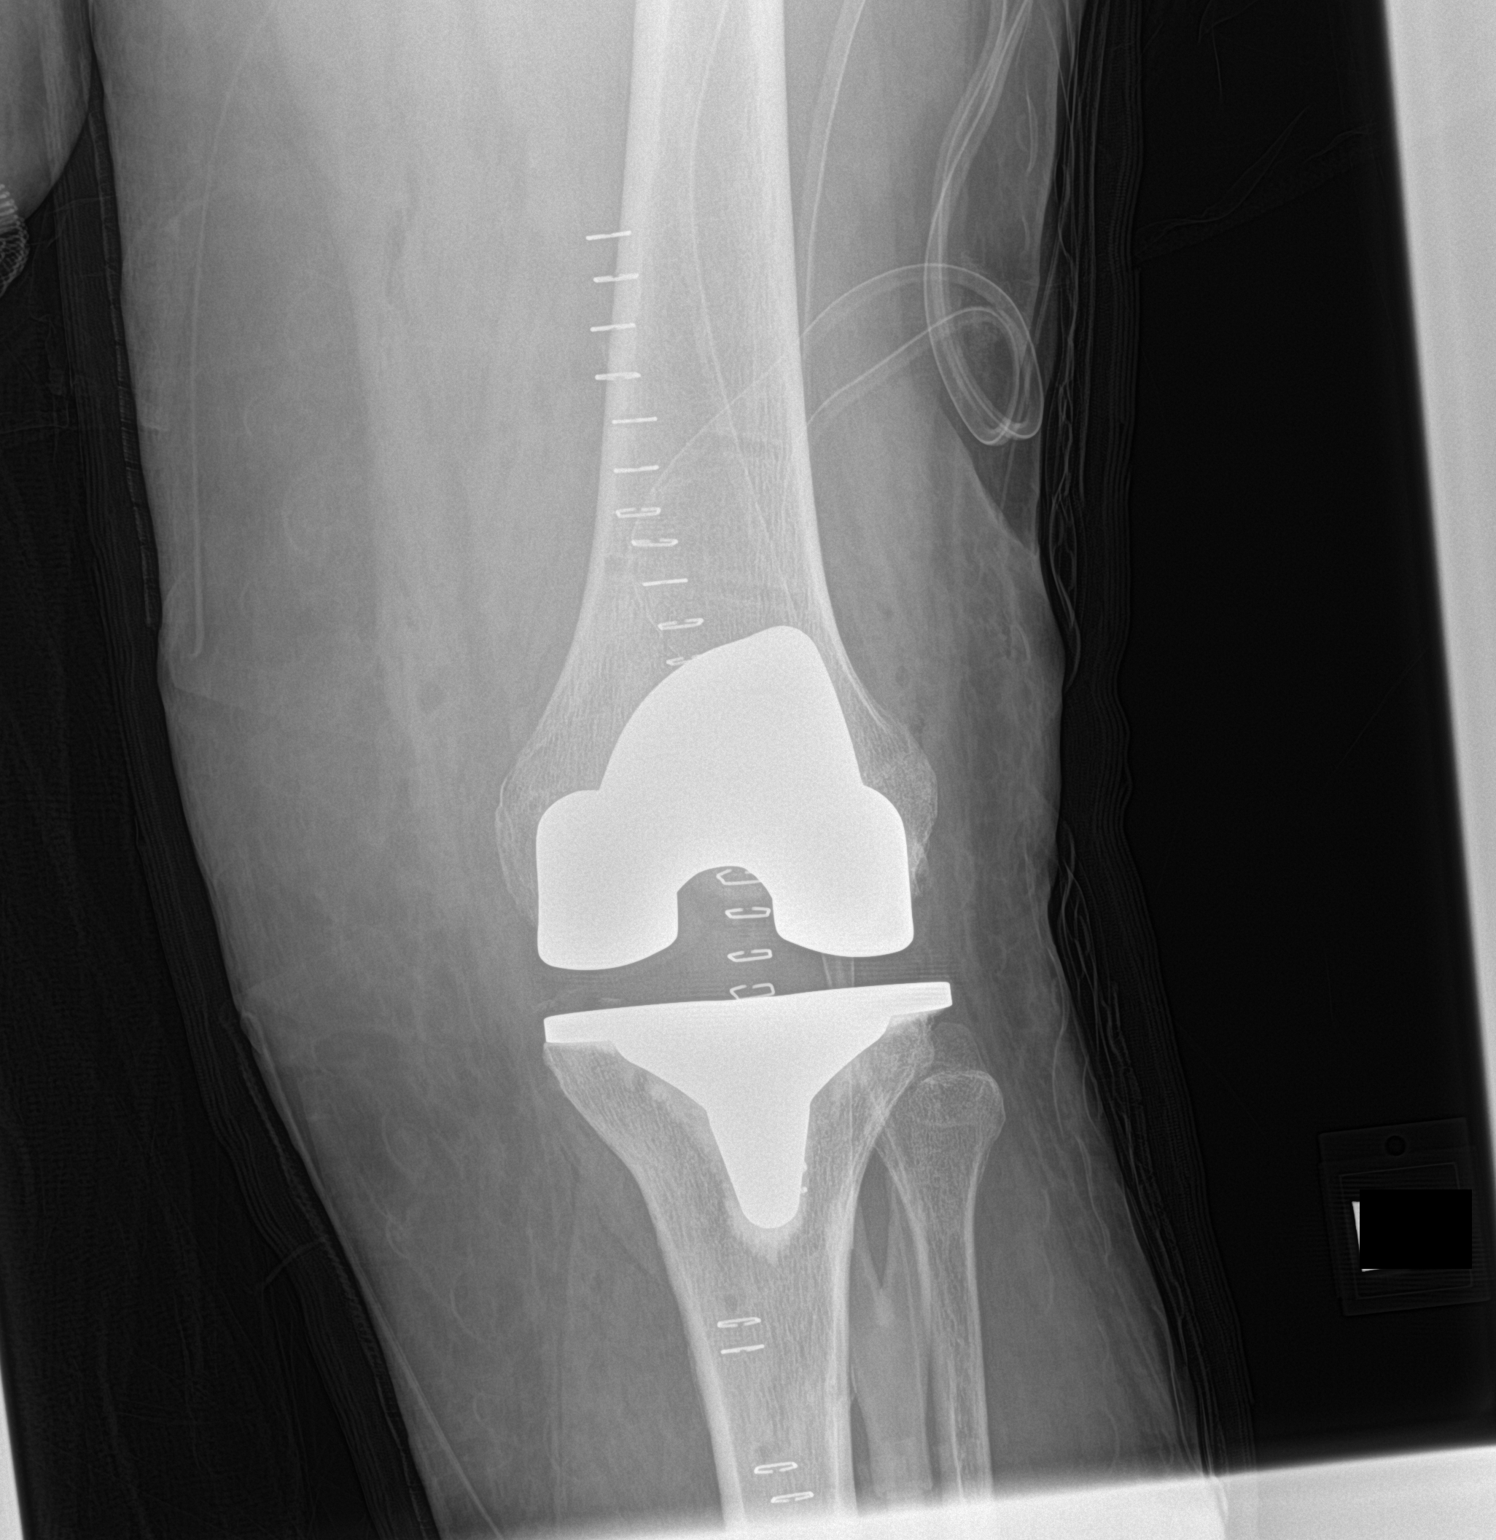

[knee lat]
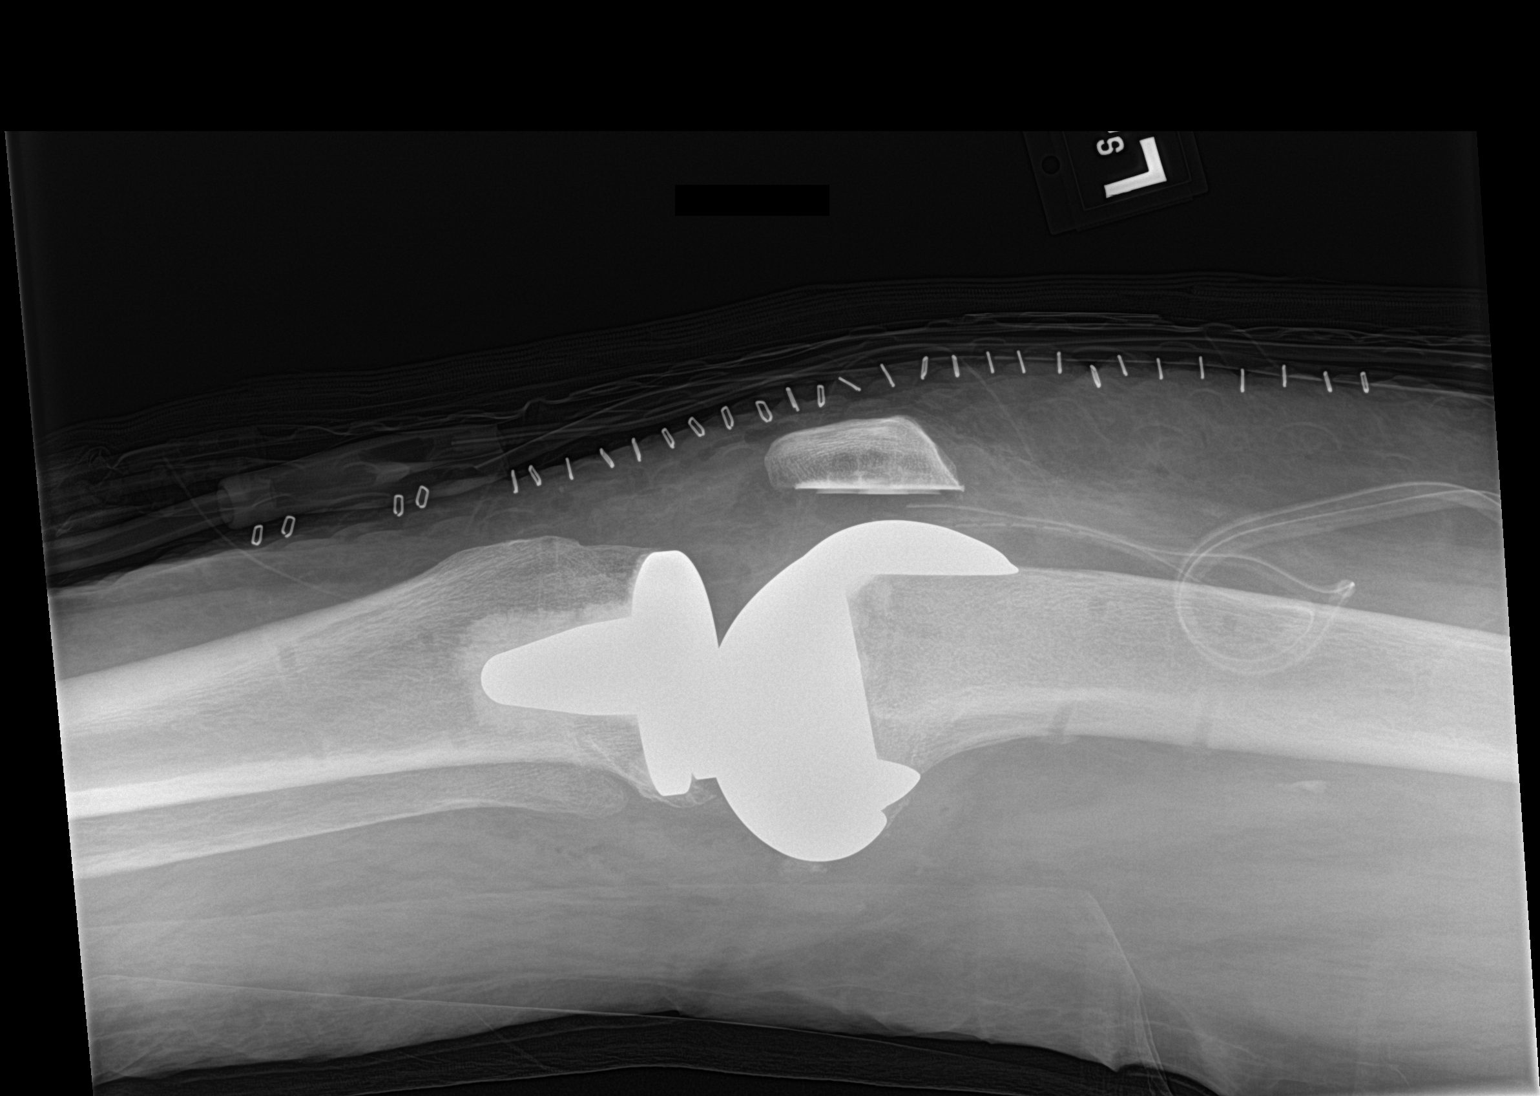

[2 of 2 positions shown; findings below may reference images not displayed]

FINDINGS: Total knee arthroplasty hardware appears appropriately positioned.
Osseous alignment is anatomic. Expected postsurgical changes within
the overlying soft tissues.
IMPRESSION: Status post total knee arthroplasty. Hardware appears intact and
appropriately positioned. No evidence of surgical complicating
feature.

## 2020-10-20 IMAGING — CR DG HIP (WITH OR WITHOUT PELVIS) 2-3V*L*
1 series · 3 of 3 positions shown · non-contrast
Comparison: CT 12/29/2014

CLINICAL DATA: Left hip pain after fall today. Left leg shortened
and externally rotated. No previous injury or surgery to left hip.

EXAM:
DG HIP (WITH OR WITHOUT PELVIS) 2-3V LEFT

[Series 1: dg hip unilat w or w/o pelvis 2-3 views  · non-contrast · 0.14mm/px · 3 of 3 slices shown]
[im 1/3]
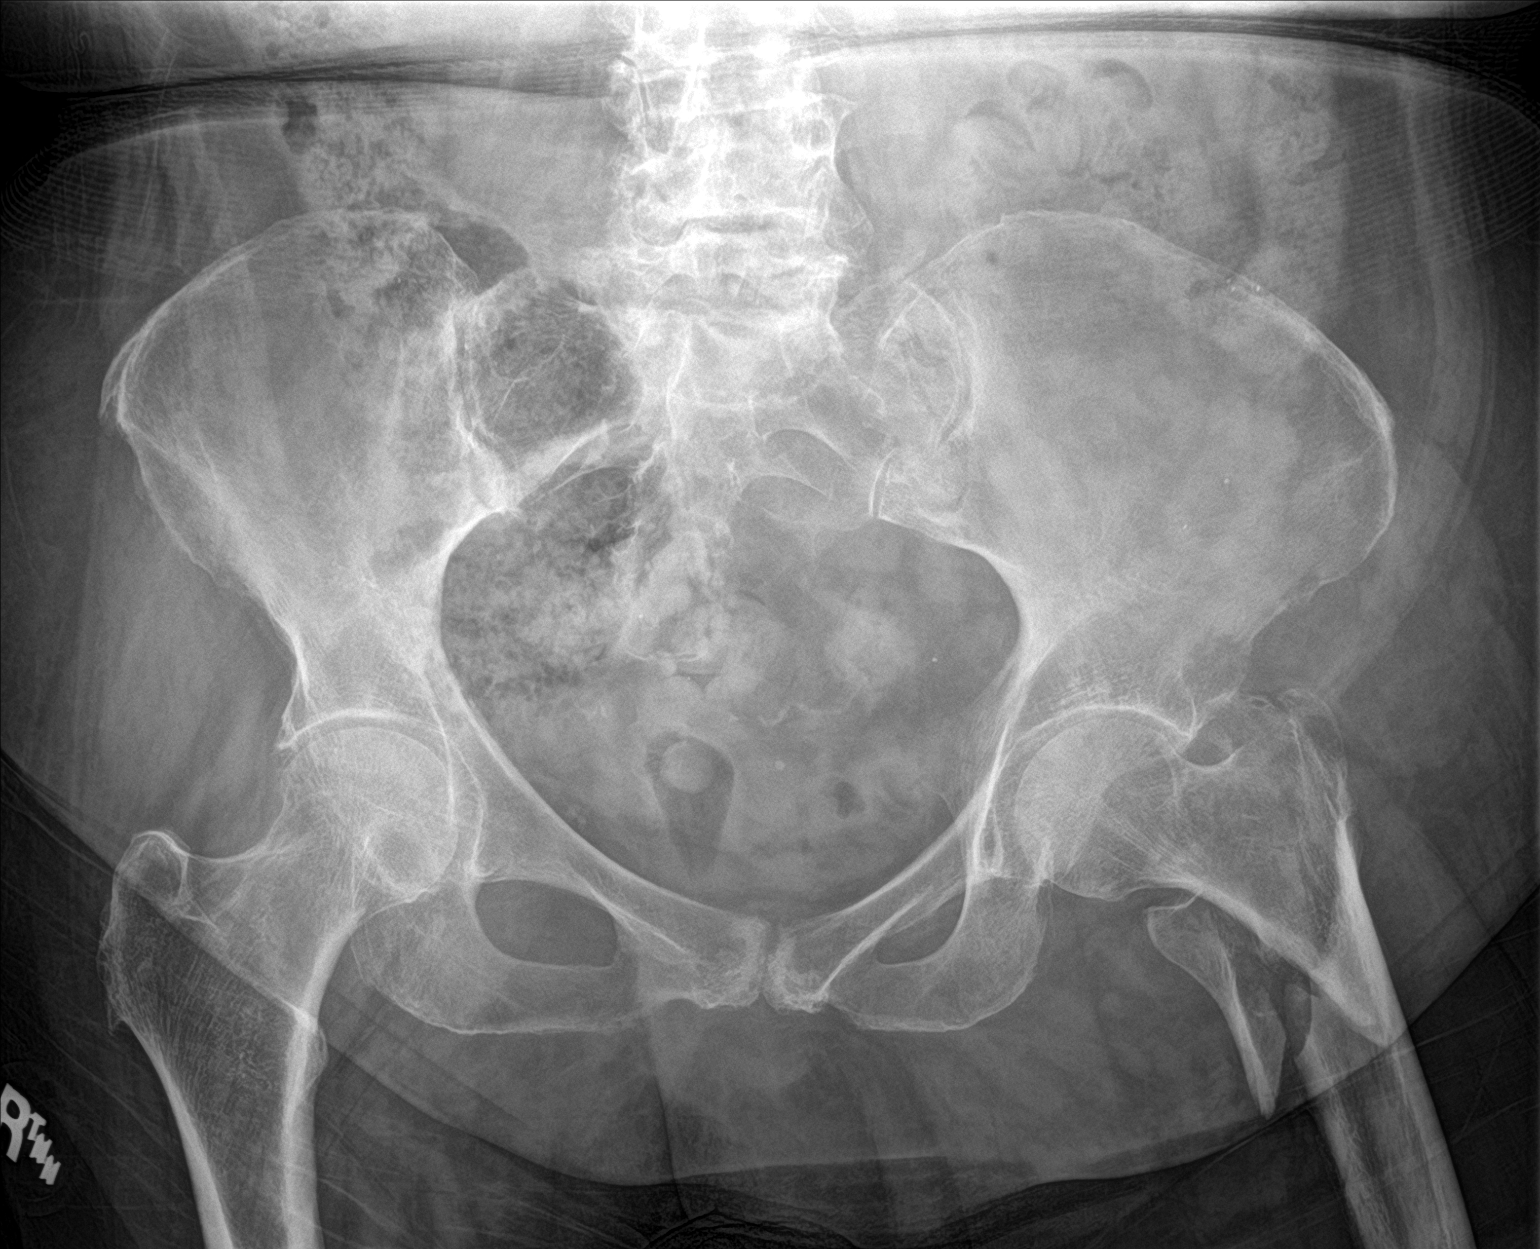
[im 2/3]
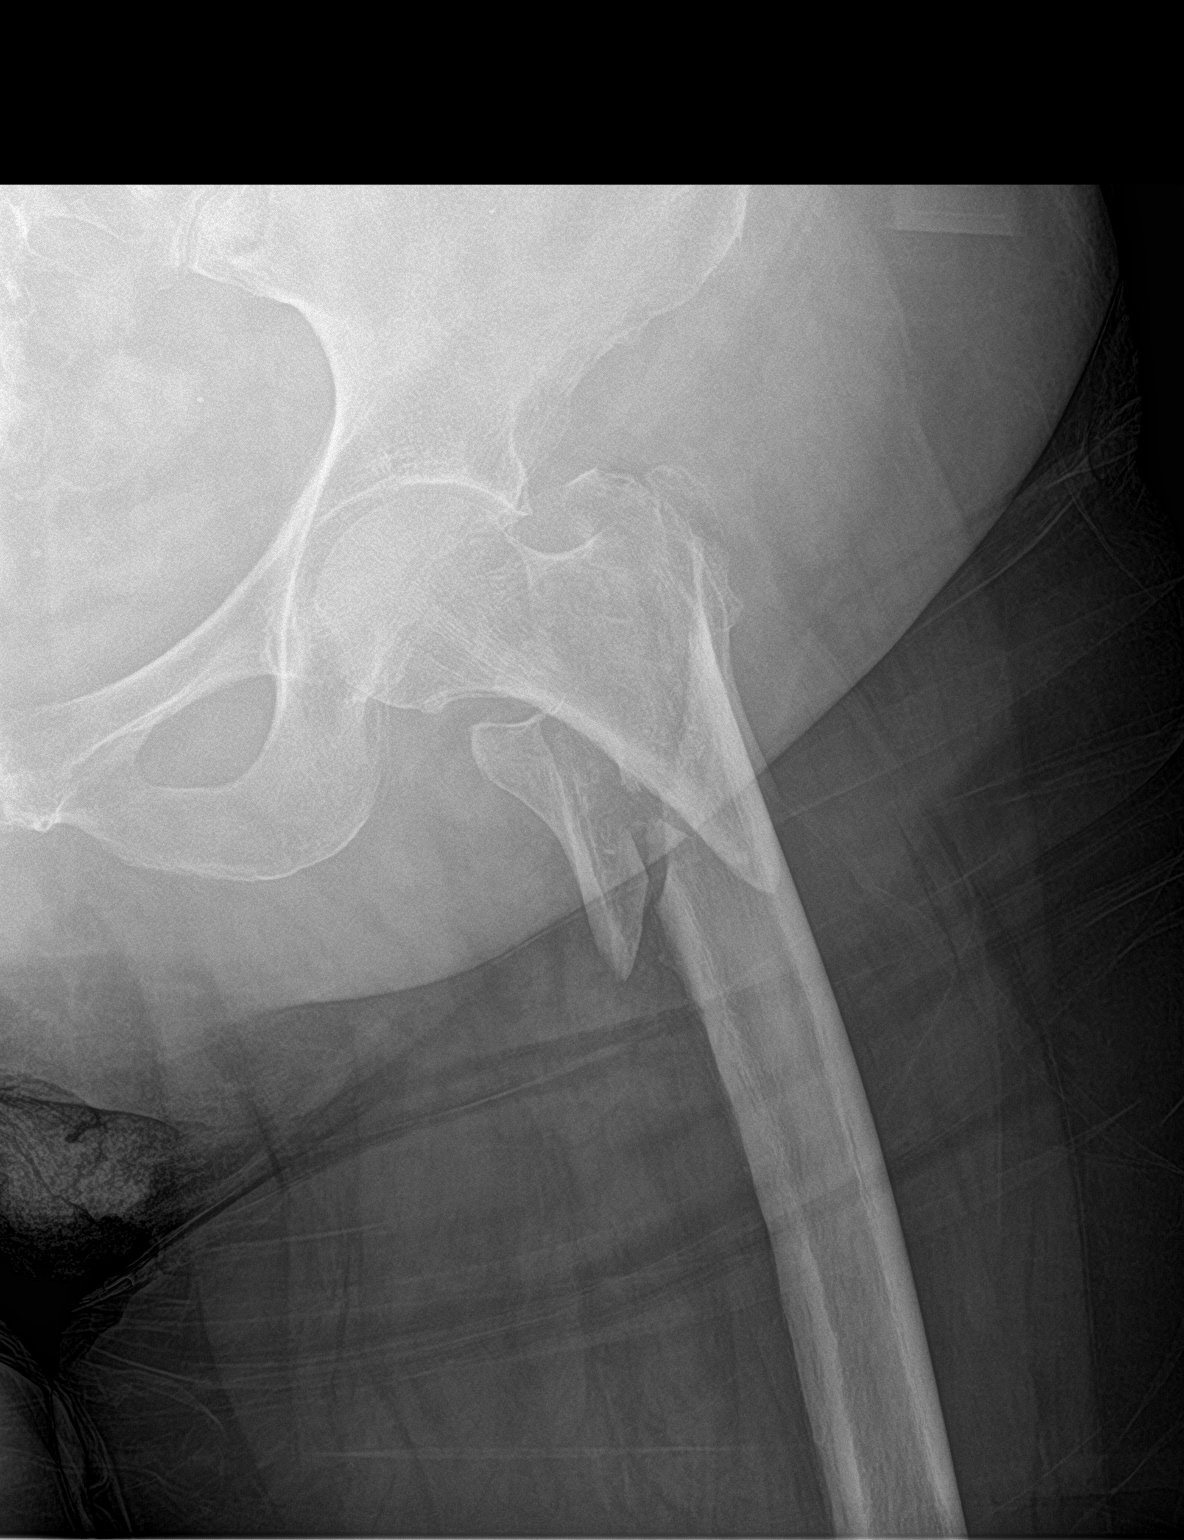
[im 3/3]
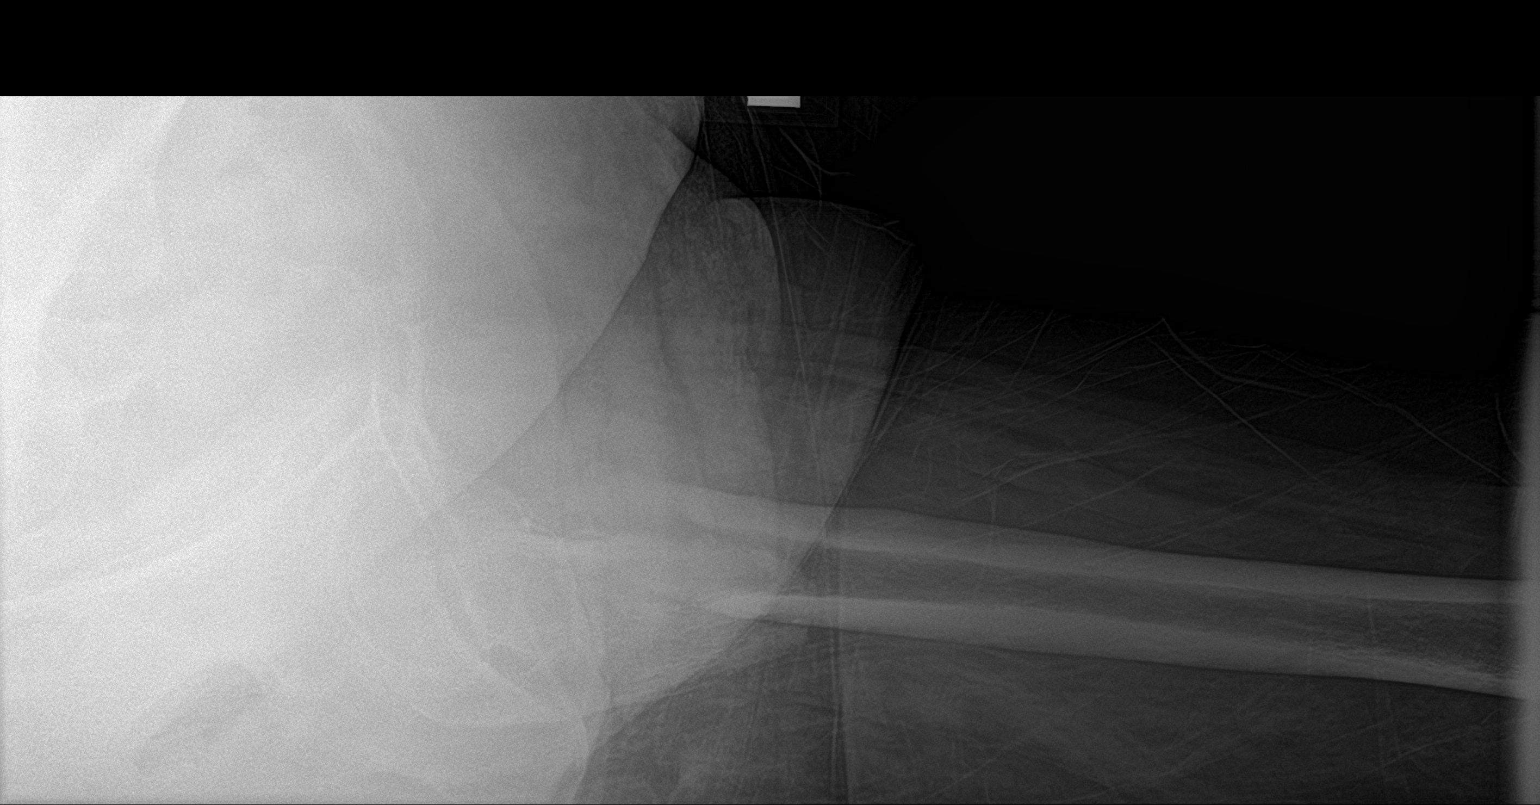

[3 of 3 positions shown; findings below may reference images not displayed]

FINDINGS: Comminuted intertrochanteric intertrochanteric fracture of the left
femur. The lesser trochanter is a separate fragment, distracted
approximately 9 mm. No dislocation. Bony pelvis intact. Spondylitic
changes in the visualized lower lumbar spine
IMPRESSION: Comminuted intertrochanteric fracture, left femur.

## 2020-10-21 IMAGING — RF DG HIP (WITH PELVIS) OPERATIVE*L*
1 series · 6 of 6 positions shown · non-contrast
Comparison: Left hip radiographs dated 06/08/2018

CLINICAL DATA: Left IM nail

EXAM:
OPERATIVE LEFT HIP (WITH PELVIS IF PERFORMED) 6 VIEWS
TECHNIQUE: Fluoroscopic spot image(s) were submitted for interpretation
post-operatively.
FLUOROSCOPY TIME:  2 minutes 16 seconds

[Series 1: run · 6 of 6 slices shown]
[im 1/6]
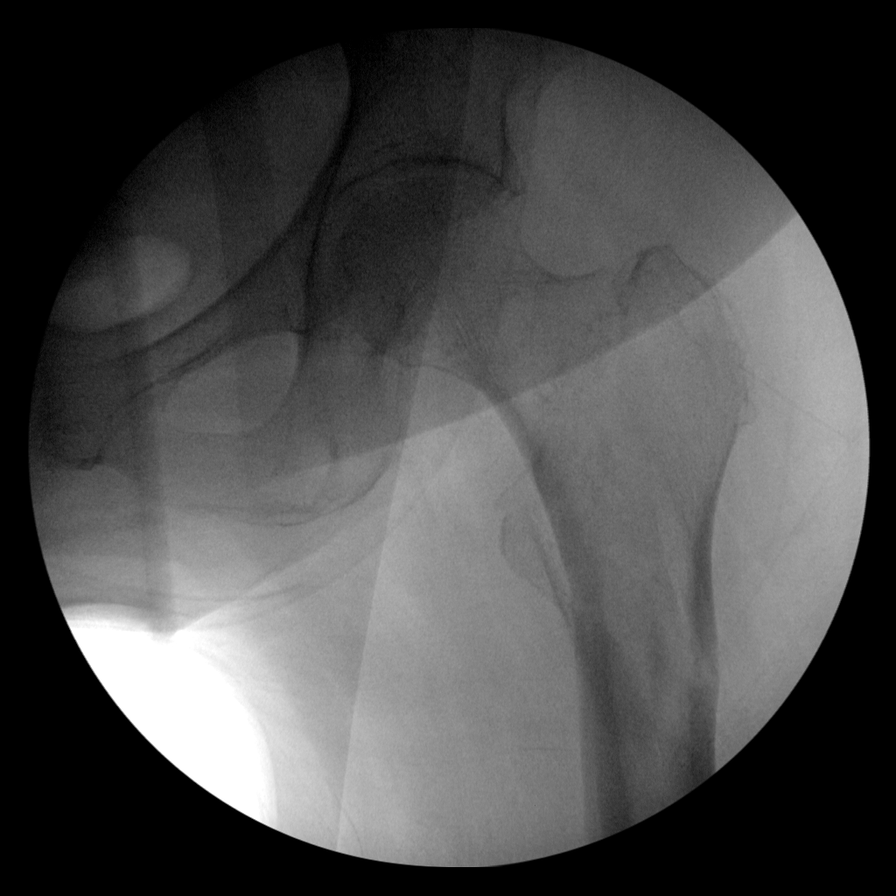
[im 2/6]
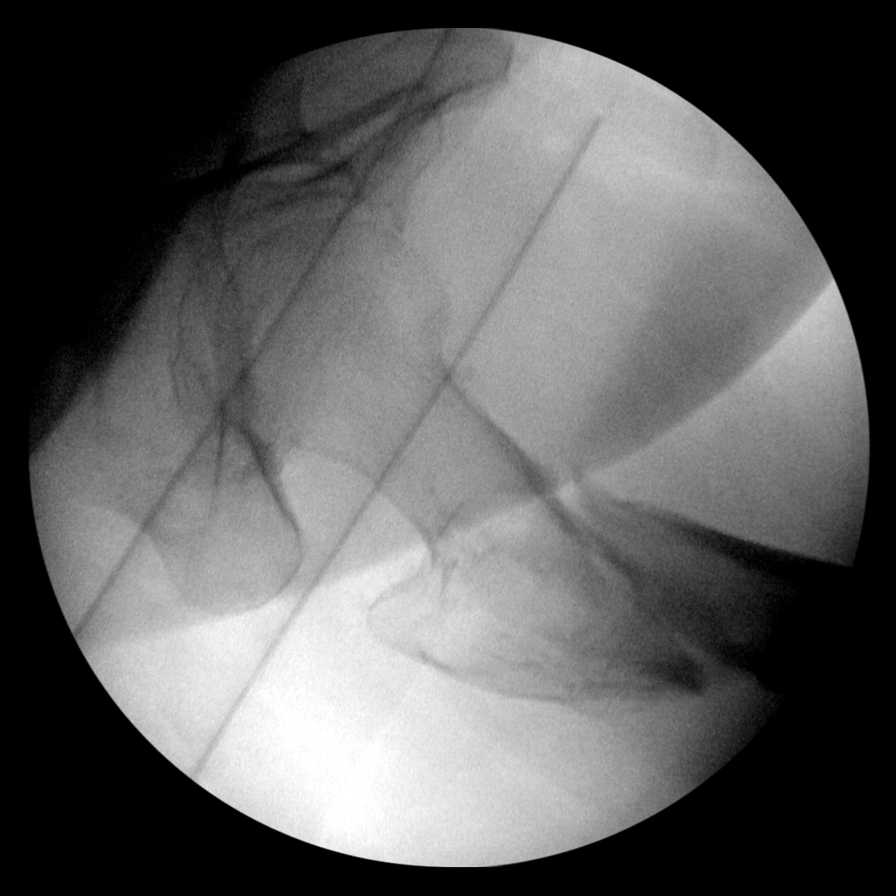
[im 3/6]
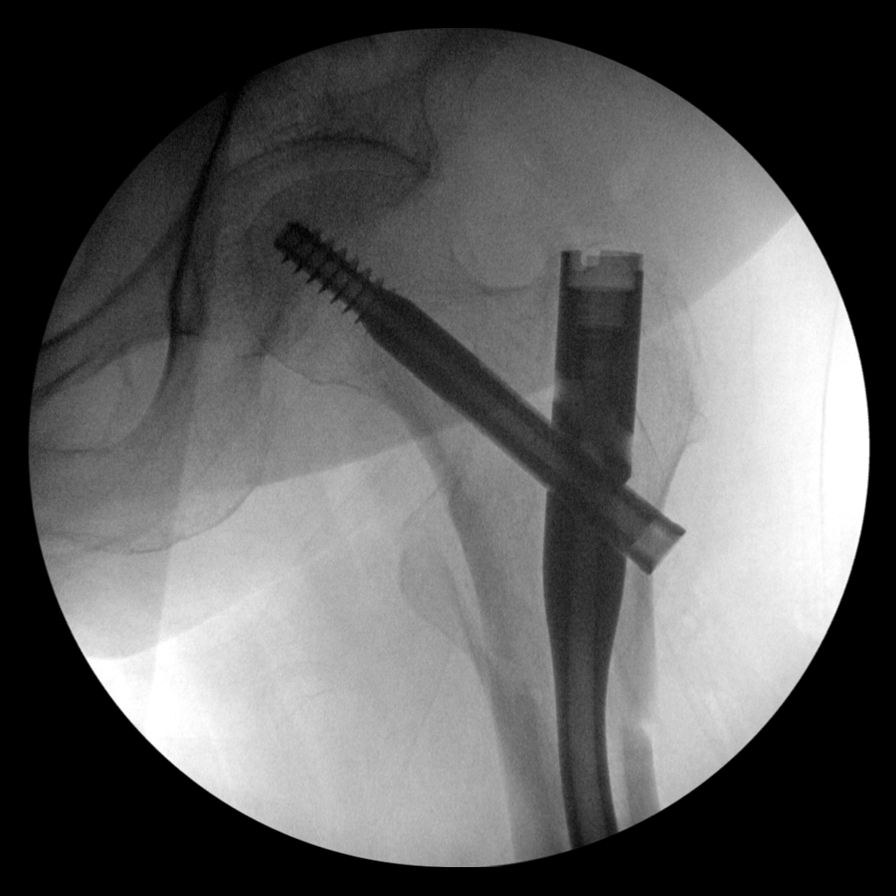
[im 4/6]
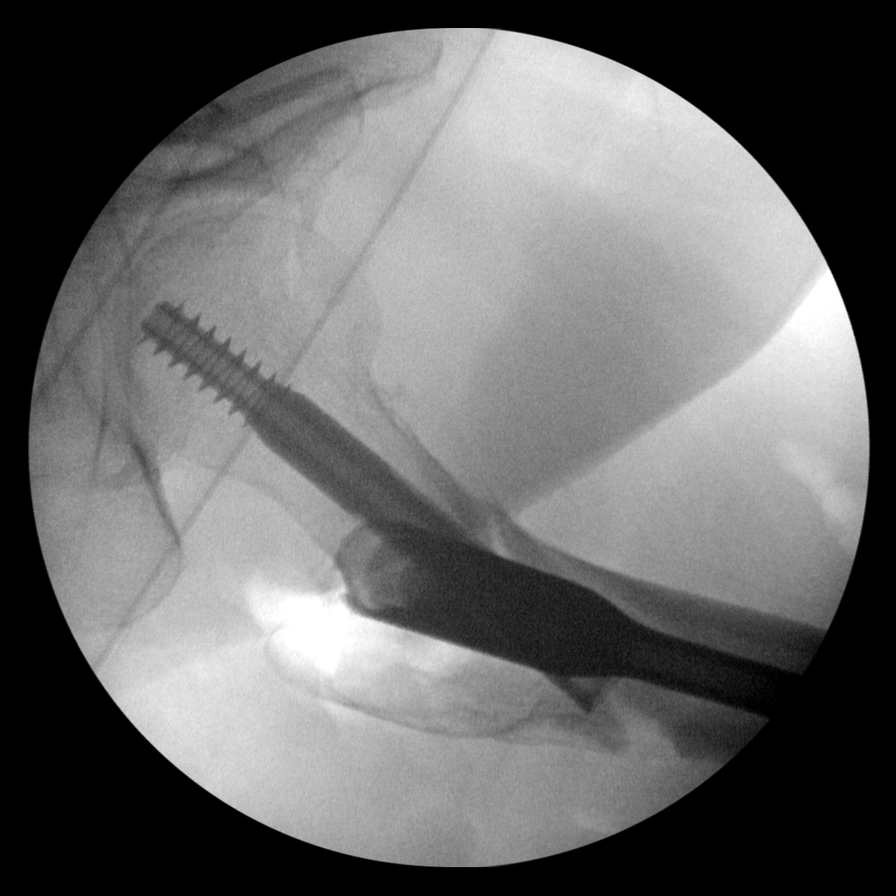
[im 5/6]
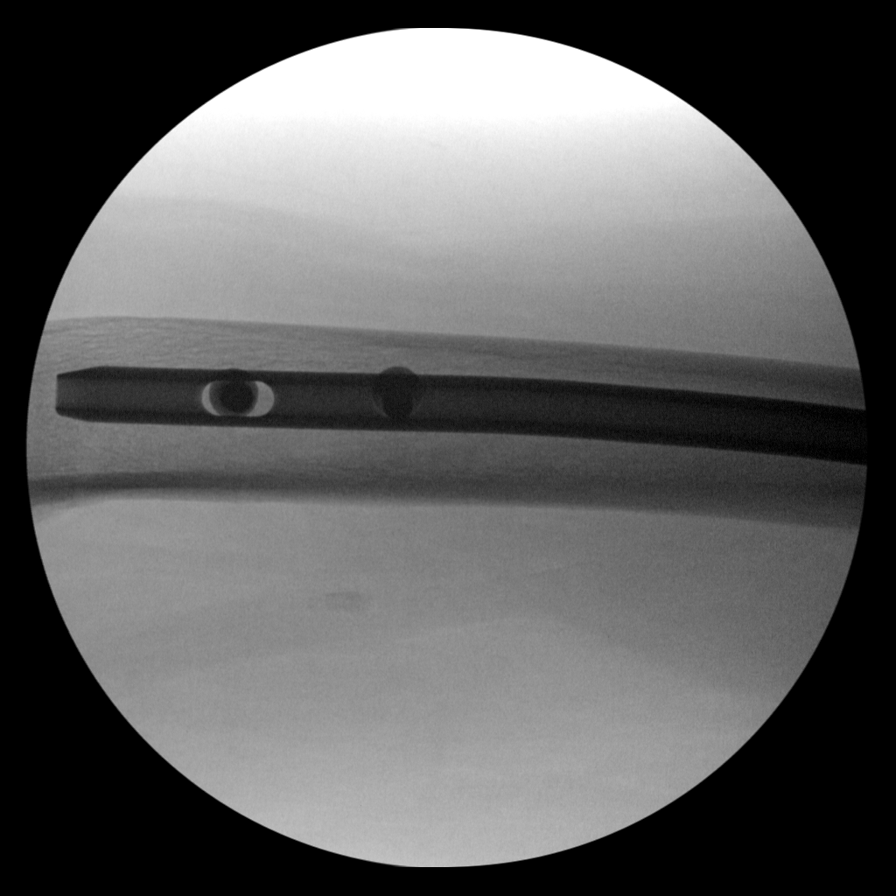
[im 6/6]
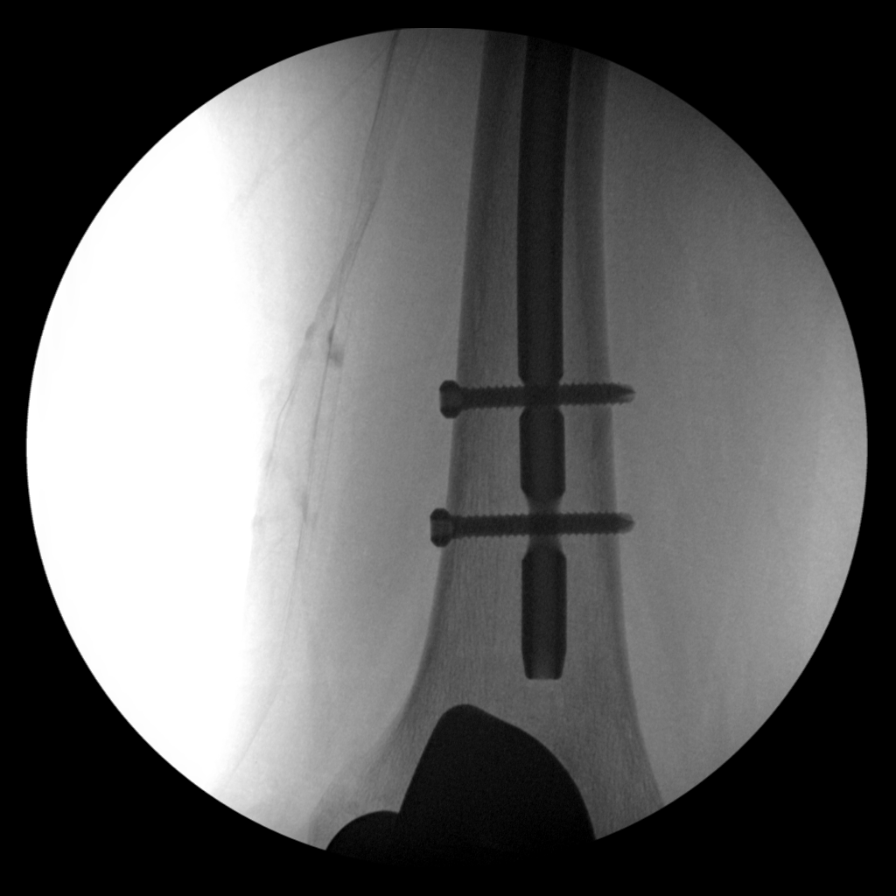

[6 of 6 positions shown; findings below may reference images not displayed]

FINDINGS: Intraoperative fluoroscopic radiographs during IM nail with dynamic
hip screw fixation of an intertrochanteric left hip fracture. Two
distal interlocking screws. Fracture fragments are in near anatomic
alignment and position.
IMPRESSION: Intraoperative fluoroscopic radiographs during ORIF of an
intertrochanteric left hip fracture, as above.
